# Patient Record
Sex: Male | Born: 1940 | Hispanic: No | Marital: Married | Smoking: Never smoker
Health system: Southern US, Community
[De-identification: ages and names within clinical notes are randomized; demographics above are authoritative.]

## PROBLEM LIST (undated history)

## (undated) DIAGNOSIS — J45909 Unspecified asthma, uncomplicated: Secondary | ICD-10-CM

## (undated) DIAGNOSIS — E785 Hyperlipidemia, unspecified: Secondary | ICD-10-CM

## (undated) DIAGNOSIS — I10 Essential (primary) hypertension: Secondary | ICD-10-CM

## (undated) DIAGNOSIS — G4730 Sleep apnea, unspecified: Secondary | ICD-10-CM

## (undated) DIAGNOSIS — I209 Angina pectoris, unspecified: Secondary | ICD-10-CM

## (undated) DIAGNOSIS — I2510 Atherosclerotic heart disease of native coronary artery without angina pectoris: Secondary | ICD-10-CM

## (undated) DIAGNOSIS — E119 Type 2 diabetes mellitus without complications: Secondary | ICD-10-CM

## (undated) HISTORY — PX: TEE WITHOUT CARDIOVERSION: SHX5443

## (undated) HISTORY — PX: HERNIA REPAIR: SHX51

## (undated) HISTORY — PX: TRACHEOSTOMY: SUR1362

## (undated) HISTORY — PX: VASCULAR SURGERY: SHX849

## (undated) HISTORY — PX: APPENDECTOMY: SHX54

## (undated) HISTORY — PX: CORONARY ARTERY BYPASS GRAFT: SHX141

---

## 2007-08-24 ENCOUNTER — Other Ambulatory Visit: Payer: Self-pay

## 2007-08-24 ENCOUNTER — Emergency Department: Payer: Self-pay | Admitting: Emergency Medicine

## 2008-01-07 ENCOUNTER — Inpatient Hospital Stay: Payer: Self-pay | Admitting: Internal Medicine

## 2008-01-07 ENCOUNTER — Other Ambulatory Visit: Payer: Self-pay

## 2010-10-15 ENCOUNTER — Ambulatory Visit: Payer: Self-pay | Admitting: Ophthalmology

## 2010-11-29 ENCOUNTER — Ambulatory Visit: Payer: Self-pay | Admitting: Ophthalmology

## 2013-09-01 DIAGNOSIS — J45909 Unspecified asthma, uncomplicated: Secondary | ICD-10-CM | POA: Insufficient documentation

## 2013-09-01 DIAGNOSIS — Z9049 Acquired absence of other specified parts of digestive tract: Secondary | ICD-10-CM

## 2013-09-01 DIAGNOSIS — R9439 Abnormal result of other cardiovascular function study: Secondary | ICD-10-CM | POA: Insufficient documentation

## 2013-09-01 HISTORY — DX: Acquired absence of other specified parts of digestive tract: Z90.49

## 2013-09-05 DIAGNOSIS — Z951 Presence of aortocoronary bypass graft: Secondary | ICD-10-CM | POA: Insufficient documentation

## 2013-09-06 DIAGNOSIS — D62 Acute posthemorrhagic anemia: Secondary | ICD-10-CM

## 2013-09-06 DIAGNOSIS — D696 Thrombocytopenia, unspecified: Secondary | ICD-10-CM | POA: Insufficient documentation

## 2013-09-06 HISTORY — DX: Acute posthemorrhagic anemia: D62

## 2013-09-19 DIAGNOSIS — D649 Anemia, unspecified: Secondary | ICD-10-CM | POA: Insufficient documentation

## 2013-09-19 DIAGNOSIS — R0689 Other abnormalities of breathing: Secondary | ICD-10-CM | POA: Insufficient documentation

## 2013-09-20 DIAGNOSIS — K567 Ileus, unspecified: Secondary | ICD-10-CM

## 2013-09-20 HISTORY — DX: Ileus, unspecified: K56.7

## 2013-09-22 DIAGNOSIS — Z930 Tracheostomy status: Secondary | ICD-10-CM | POA: Insufficient documentation

## 2013-11-18 ENCOUNTER — Ambulatory Visit: Payer: Self-pay | Admitting: Physician Assistant

## 2013-12-14 ENCOUNTER — Ambulatory Visit: Payer: Self-pay | Admitting: Internal Medicine

## 2014-02-27 ENCOUNTER — Ambulatory Visit: Payer: Self-pay | Admitting: Physician Assistant

## 2014-03-16 ENCOUNTER — Ambulatory Visit: Payer: Self-pay | Admitting: Physician Assistant

## 2014-07-31 ENCOUNTER — Ambulatory Visit: Payer: Self-pay | Admitting: Physician Assistant

## 2014-11-06 DIAGNOSIS — I2510 Atherosclerotic heart disease of native coronary artery without angina pectoris: Secondary | ICD-10-CM | POA: Diagnosis not present

## 2014-11-06 DIAGNOSIS — R300 Dysuria: Secondary | ICD-10-CM | POA: Diagnosis not present

## 2014-11-06 DIAGNOSIS — G479 Sleep disorder, unspecified: Secondary | ICD-10-CM | POA: Diagnosis not present

## 2014-11-06 DIAGNOSIS — I83813 Varicose veins of bilateral lower extremities with pain: Secondary | ICD-10-CM | POA: Diagnosis not present

## 2014-11-06 DIAGNOSIS — E1165 Type 2 diabetes mellitus with hyperglycemia: Secondary | ICD-10-CM | POA: Diagnosis not present

## 2014-11-06 DIAGNOSIS — D649 Anemia, unspecified: Secondary | ICD-10-CM | POA: Diagnosis not present

## 2014-11-06 DIAGNOSIS — Z0001 Encounter for general adult medical examination with abnormal findings: Secondary | ICD-10-CM | POA: Diagnosis not present

## 2014-11-14 DIAGNOSIS — G4733 Obstructive sleep apnea (adult) (pediatric): Secondary | ICD-10-CM | POA: Diagnosis not present

## 2014-11-21 DIAGNOSIS — E1165 Type 2 diabetes mellitus with hyperglycemia: Secondary | ICD-10-CM | POA: Diagnosis not present

## 2014-11-21 DIAGNOSIS — G4733 Obstructive sleep apnea (adult) (pediatric): Secondary | ICD-10-CM | POA: Diagnosis not present

## 2014-11-21 DIAGNOSIS — D519 Vitamin B12 deficiency anemia, unspecified: Secondary | ICD-10-CM | POA: Diagnosis not present

## 2014-11-23 DIAGNOSIS — I700 Atherosclerosis of aorta: Secondary | ICD-10-CM | POA: Diagnosis not present

## 2014-11-23 DIAGNOSIS — M79669 Pain in unspecified lower leg: Secondary | ICD-10-CM | POA: Diagnosis not present

## 2014-12-05 DIAGNOSIS — G4733 Obstructive sleep apnea (adult) (pediatric): Secondary | ICD-10-CM | POA: Diagnosis not present

## 2014-12-25 DIAGNOSIS — I10 Essential (primary) hypertension: Secondary | ICD-10-CM | POA: Diagnosis not present

## 2014-12-25 DIAGNOSIS — E1165 Type 2 diabetes mellitus with hyperglycemia: Secondary | ICD-10-CM | POA: Diagnosis not present

## 2014-12-25 DIAGNOSIS — G4733 Obstructive sleep apnea (adult) (pediatric): Secondary | ICD-10-CM | POA: Diagnosis not present

## 2014-12-25 DIAGNOSIS — J4520 Mild intermittent asthma, uncomplicated: Secondary | ICD-10-CM | POA: Diagnosis not present

## 2015-01-03 DIAGNOSIS — G4710 Hypersomnia, unspecified: Secondary | ICD-10-CM | POA: Diagnosis not present

## 2015-01-03 DIAGNOSIS — G4733 Obstructive sleep apnea (adult) (pediatric): Secondary | ICD-10-CM | POA: Diagnosis not present

## 2015-01-22 DIAGNOSIS — Z1211 Encounter for screening for malignant neoplasm of colon: Secondary | ICD-10-CM | POA: Diagnosis not present

## 2015-01-22 DIAGNOSIS — D649 Anemia, unspecified: Secondary | ICD-10-CM | POA: Diagnosis not present

## 2015-01-29 DIAGNOSIS — J4520 Mild intermittent asthma, uncomplicated: Secondary | ICD-10-CM | POA: Diagnosis not present

## 2015-01-29 DIAGNOSIS — G4733 Obstructive sleep apnea (adult) (pediatric): Secondary | ICD-10-CM | POA: Diagnosis not present

## 2015-02-02 DIAGNOSIS — G4710 Hypersomnia, unspecified: Secondary | ICD-10-CM | POA: Diagnosis not present

## 2015-02-02 DIAGNOSIS — G4733 Obstructive sleep apnea (adult) (pediatric): Secondary | ICD-10-CM | POA: Diagnosis not present

## 2015-02-05 DIAGNOSIS — G4733 Obstructive sleep apnea (adult) (pediatric): Secondary | ICD-10-CM | POA: Diagnosis not present

## 2015-02-05 DIAGNOSIS — I10 Essential (primary) hypertension: Secondary | ICD-10-CM | POA: Diagnosis not present

## 2015-02-05 DIAGNOSIS — E119 Type 2 diabetes mellitus without complications: Secondary | ICD-10-CM | POA: Diagnosis not present

## 2015-02-05 DIAGNOSIS — E6609 Other obesity due to excess calories: Secondary | ICD-10-CM | POA: Diagnosis not present

## 2015-02-05 DIAGNOSIS — L600 Ingrowing nail: Secondary | ICD-10-CM | POA: Diagnosis not present

## 2015-02-05 DIAGNOSIS — E1165 Type 2 diabetes mellitus with hyperglycemia: Secondary | ICD-10-CM | POA: Diagnosis not present

## 2015-02-05 DIAGNOSIS — I2510 Atherosclerotic heart disease of native coronary artery without angina pectoris: Secondary | ICD-10-CM | POA: Diagnosis not present

## 2015-02-05 DIAGNOSIS — I25119 Atherosclerotic heart disease of native coronary artery with unspecified angina pectoris: Secondary | ICD-10-CM | POA: Diagnosis not present

## 2015-02-05 DIAGNOSIS — B351 Tinea unguium: Secondary | ICD-10-CM | POA: Diagnosis not present

## 2015-02-05 DIAGNOSIS — M722 Plantar fascial fibromatosis: Secondary | ICD-10-CM | POA: Diagnosis not present

## 2015-02-05 DIAGNOSIS — E782 Mixed hyperlipidemia: Secondary | ICD-10-CM | POA: Diagnosis not present

## 2015-03-05 DIAGNOSIS — G4733 Obstructive sleep apnea (adult) (pediatric): Secondary | ICD-10-CM | POA: Diagnosis not present

## 2015-03-05 DIAGNOSIS — G4710 Hypersomnia, unspecified: Secondary | ICD-10-CM | POA: Diagnosis not present

## 2015-03-12 DIAGNOSIS — E782 Mixed hyperlipidemia: Secondary | ICD-10-CM | POA: Diagnosis not present

## 2015-03-12 DIAGNOSIS — I10 Essential (primary) hypertension: Secondary | ICD-10-CM | POA: Diagnosis not present

## 2015-03-12 DIAGNOSIS — E1165 Type 2 diabetes mellitus with hyperglycemia: Secondary | ICD-10-CM | POA: Diagnosis not present

## 2015-03-12 DIAGNOSIS — D649 Anemia, unspecified: Secondary | ICD-10-CM | POA: Diagnosis not present

## 2015-03-26 DIAGNOSIS — Z1211 Encounter for screening for malignant neoplasm of colon: Secondary | ICD-10-CM | POA: Diagnosis not present

## 2015-04-04 DIAGNOSIS — G4733 Obstructive sleep apnea (adult) (pediatric): Secondary | ICD-10-CM | POA: Diagnosis not present

## 2015-04-04 DIAGNOSIS — G4710 Hypersomnia, unspecified: Secondary | ICD-10-CM | POA: Diagnosis not present

## 2015-04-23 DIAGNOSIS — I10 Essential (primary) hypertension: Secondary | ICD-10-CM | POA: Diagnosis not present

## 2015-04-23 DIAGNOSIS — D519 Vitamin B12 deficiency anemia, unspecified: Secondary | ICD-10-CM | POA: Diagnosis not present

## 2015-04-23 DIAGNOSIS — E782 Mixed hyperlipidemia: Secondary | ICD-10-CM | POA: Diagnosis not present

## 2015-04-23 DIAGNOSIS — E1142 Type 2 diabetes mellitus with diabetic polyneuropathy: Secondary | ICD-10-CM | POA: Diagnosis not present

## 2015-04-23 DIAGNOSIS — I83813 Varicose veins of bilateral lower extremities with pain: Secondary | ICD-10-CM | POA: Diagnosis not present

## 2015-05-04 ENCOUNTER — Encounter: Payer: Self-pay | Admitting: *Deleted

## 2015-05-05 DIAGNOSIS — G4710 Hypersomnia, unspecified: Secondary | ICD-10-CM | POA: Diagnosis not present

## 2015-05-05 DIAGNOSIS — G4733 Obstructive sleep apnea (adult) (pediatric): Secondary | ICD-10-CM | POA: Diagnosis not present

## 2015-05-07 ENCOUNTER — Ambulatory Visit
Admission: RE | Admit: 2015-05-07 | Discharge: 2015-05-07 | Disposition: A | Discharge status: Home / Self Care | Payer: Medicare Other | Source: Ambulatory Visit | Attending: Unknown Physician Specialty | Admitting: Unknown Physician Specialty

## 2015-05-07 ENCOUNTER — Encounter: Payer: Self-pay | Admitting: *Deleted

## 2015-05-07 HISTORY — DX: Hyperlipidemia, unspecified: E78.5

## 2015-05-07 HISTORY — DX: Essential (primary) hypertension: I10

## 2015-05-07 HISTORY — DX: Atherosclerotic heart disease of native coronary artery without angina pectoris: I25.10

## 2015-05-07 HISTORY — DX: Unspecified asthma, uncomplicated: J45.909

## 2015-05-07 HISTORY — DX: Angina pectoris, unspecified: I20.9

## 2015-05-07 HISTORY — DX: Type 2 diabetes mellitus without complications: E11.9

## 2015-05-07 HISTORY — DX: Sleep apnea, unspecified: G47.30

## 2015-05-17 DIAGNOSIS — E1142 Type 2 diabetes mellitus with diabetic polyneuropathy: Secondary | ICD-10-CM | POA: Diagnosis not present

## 2015-05-17 DIAGNOSIS — I10 Essential (primary) hypertension: Secondary | ICD-10-CM | POA: Diagnosis not present

## 2015-05-17 DIAGNOSIS — I83813 Varicose veins of bilateral lower extremities with pain: Secondary | ICD-10-CM | POA: Diagnosis not present

## 2015-05-17 DIAGNOSIS — M79669 Pain in unspecified lower leg: Secondary | ICD-10-CM | POA: Diagnosis not present

## 2015-05-17 DIAGNOSIS — I2510 Atherosclerotic heart disease of native coronary artery without angina pectoris: Secondary | ICD-10-CM | POA: Diagnosis not present

## 2015-06-04 DIAGNOSIS — B351 Tinea unguium: Secondary | ICD-10-CM | POA: Diagnosis not present

## 2015-06-04 DIAGNOSIS — M79675 Pain in left toe(s): Secondary | ICD-10-CM | POA: Diagnosis not present

## 2015-06-04 DIAGNOSIS — M79674 Pain in right toe(s): Secondary | ICD-10-CM | POA: Diagnosis not present

## 2015-06-04 DIAGNOSIS — M722 Plantar fascial fibromatosis: Secondary | ICD-10-CM | POA: Diagnosis not present

## 2015-06-04 DIAGNOSIS — M7752 Other enthesopathy of left foot: Secondary | ICD-10-CM | POA: Diagnosis not present

## 2015-06-05 DIAGNOSIS — G4733 Obstructive sleep apnea (adult) (pediatric): Secondary | ICD-10-CM | POA: Diagnosis not present

## 2015-06-05 DIAGNOSIS — G4710 Hypersomnia, unspecified: Secondary | ICD-10-CM | POA: Diagnosis not present

## 2015-06-08 ENCOUNTER — Encounter: Payer: Self-pay | Admitting: *Deleted

## 2015-06-11 ENCOUNTER — Encounter: Payer: Self-pay | Admitting: *Deleted

## 2015-06-11 ENCOUNTER — Ambulatory Visit
Admission: RE | Admit: 2015-06-11 | Payer: Medicare Other | Source: Ambulatory Visit | Admitting: Unknown Physician Specialty

## 2015-06-11 ENCOUNTER — Encounter
Admission: RE | Disposition: A | Discharge status: Home / Self Care | Payer: Self-pay | Source: Ambulatory Visit | Attending: Unknown Physician Specialty

## 2015-06-11 ENCOUNTER — Ambulatory Visit: Payer: Medicare Other | Admitting: Certified Registered Nurse Anesthetist

## 2015-06-11 ENCOUNTER — Ambulatory Visit
Admission: RE | Admit: 2015-06-11 | Discharge: 2015-06-11 | Disposition: A | Discharge status: Home / Self Care | Payer: Medicare Other | Source: Ambulatory Visit | Attending: Unknown Physician Specialty | Admitting: Unknown Physician Specialty

## 2015-06-11 ENCOUNTER — Encounter: Admission: RE | Payer: Self-pay | Source: Ambulatory Visit

## 2015-06-11 DIAGNOSIS — K5730 Diverticulosis of large intestine without perforation or abscess without bleeding: Secondary | ICD-10-CM | POA: Insufficient documentation

## 2015-06-11 DIAGNOSIS — I10 Essential (primary) hypertension: Secondary | ICD-10-CM | POA: Insufficient documentation

## 2015-06-11 DIAGNOSIS — Z7982 Long term (current) use of aspirin: Secondary | ICD-10-CM | POA: Diagnosis not present

## 2015-06-11 DIAGNOSIS — G4730 Sleep apnea, unspecified: Secondary | ICD-10-CM | POA: Insufficient documentation

## 2015-06-11 DIAGNOSIS — K640 First degree hemorrhoids: Secondary | ICD-10-CM | POA: Diagnosis not present

## 2015-06-11 DIAGNOSIS — J45909 Unspecified asthma, uncomplicated: Secondary | ICD-10-CM | POA: Diagnosis not present

## 2015-06-11 DIAGNOSIS — Z79899 Other long term (current) drug therapy: Secondary | ICD-10-CM | POA: Insufficient documentation

## 2015-06-11 DIAGNOSIS — Z980 Intestinal bypass and anastomosis status: Secondary | ICD-10-CM | POA: Diagnosis not present

## 2015-06-11 DIAGNOSIS — Z9109 Other allergy status, other than to drugs and biological substances: Secondary | ICD-10-CM | POA: Diagnosis not present

## 2015-06-11 DIAGNOSIS — E785 Hyperlipidemia, unspecified: Secondary | ICD-10-CM | POA: Insufficient documentation

## 2015-06-11 DIAGNOSIS — Z1211 Encounter for screening for malignant neoplasm of colon: Secondary | ICD-10-CM | POA: Insufficient documentation

## 2015-06-11 DIAGNOSIS — K635 Polyp of colon: Secondary | ICD-10-CM | POA: Diagnosis not present

## 2015-06-11 DIAGNOSIS — E119 Type 2 diabetes mellitus without complications: Secondary | ICD-10-CM | POA: Diagnosis not present

## 2015-06-11 DIAGNOSIS — D125 Benign neoplasm of sigmoid colon: Secondary | ICD-10-CM | POA: Diagnosis not present

## 2015-06-11 DIAGNOSIS — Z880 Allergy status to penicillin: Secondary | ICD-10-CM | POA: Insufficient documentation

## 2015-06-11 DIAGNOSIS — K648 Other hemorrhoids: Secondary | ICD-10-CM | POA: Diagnosis not present

## 2015-06-11 DIAGNOSIS — Z951 Presence of aortocoronary bypass graft: Secondary | ICD-10-CM | POA: Diagnosis not present

## 2015-06-11 DIAGNOSIS — I2510 Atherosclerotic heart disease of native coronary artery without angina pectoris: Secondary | ICD-10-CM | POA: Diagnosis not present

## 2015-06-11 HISTORY — PX: COLONOSCOPY WITH PROPOFOL: SHX5780

## 2015-06-11 LAB — GLUCOSE, CAPILLARY: GLUCOSE-CAPILLARY: 124 mg/dL — AB (ref 65–99)

## 2015-06-11 SURGERY — COLONOSCOPY WITH PROPOFOL
Anesthesia: General

## 2015-06-11 MED ORDER — FENTANYL CITRATE (PF) 100 MCG/2ML IJ SOLN: INTRAMUSCULAR | Status: DC | PRN

## 2015-06-11 MED ORDER — PROPOFOL 500 MG/50ML IV EMUL
INTRAVENOUS | Status: DC | PRN
  Administered 2015-06-11: 120 ug/kg/min via INTRAVENOUS

## 2015-06-11 MED ORDER — SODIUM CHLORIDE 0.9 % IV SOLN
INTRAVENOUS | Status: DC
  Administered 2015-06-11: 11:00:00 via INTRAVENOUS

## 2015-06-11 MED ORDER — SODIUM CHLORIDE 0.9 % IV SOLN: INTRAVENOUS | Status: DC

## 2015-06-11 MED ORDER — MIDAZOLAM HCL 5 MG/5ML IJ SOLN: INTRAMUSCULAR | Status: DC | PRN

## 2015-06-11 MED ORDER — LIDOCAINE HCL (CARDIAC) 20 MG/ML IV SOLN: INTRAVENOUS | Status: DC | PRN

## 2015-06-11 MED ORDER — GLYCOPYRROLATE 0.2 MG/ML IJ SOLN: INTRAMUSCULAR | Status: DC | PRN

## 2015-06-11 MED ORDER — PROPOFOL 10 MG/ML IV BOLUS
INTRAVENOUS | Status: DC | PRN
  Administered 2015-06-11: 35 mg via INTRAVENOUS

## 2015-06-11 NOTE — Op Note (Signed)
[facility] Gastroenterology Patient Name: Daniel Hodges Procedure Date: 06/11/2015 10:53 AM MRN: [id number] Account #: [id number] Date of Birth: September 22, 1940 Admit Type: Outpatient Age: 74 Room: [facility] ENDO ROOM 1 Gender: Male Note Status: Finalized Procedure:         Colonoscopy Indications:       Screening for colorectal malignant neoplasm Providers:         Manya Silvas, MD Referring MD:      Allyne Gee, MD (Referring MD) Medicines:         Propofol per Anesthesia Complications:     No immediate complications. Procedure:         Pre-Anesthesia Assessment:                    - After reviewing the risks and benefits, the patient was                     deemed in satisfactory condition to undergo the procedure.                    After obtaining informed consent, the colonoscope was                     passed under direct vision. Throughout the procedure, the                     patient's blood pressure, pulse, and oxygen saturations                     were monitored continuously. The Colonoscope was                     introduced through the anus and advanced to the the                     ileocolonic anastomosis. The colonoscopy was performed                     without difficulty. The patient tolerated the procedure                     well. The quality of the bowel preparation was adequate to                     identify polyps. Findings:      A diminutive polyp was found in the sigmoid colon. The polyp was       sessile. The polyp was removed with a jumbo cold forceps. Resection and       retrieval were complete.      A few small-mouthed diverticula were found in the descending colon.      Internal hemorrhoids were found during endoscopy. The hemorrhoids were       small and Grade I (internal hemorrhoids that do not prolapse).      There was evidence of a prior end-to-side ileo-colonic anastomosis in       the ascending colon. This was patent. This  was characterized by healthy       appearing mucosa. This was not traversed. Impression:        - One diminutive polyp in the sigmoid colon. Resected and                     retrieved.                    -  Diverticulosis in the descending colon.                    - Internal hemorrhoids.                    - Patent end-to-side ileo-colonic anastomosis,                     characterized by healthy appearing mucosa. Recommendation:    - Await pathology results. Manya Silvas, MD 06/11/2015 11:16:08 AM This report has been signed electronically. Number of Addenda: 0 Note Initiated On: 06/11/2015 10:53 AM Scope Withdrawal Time: 0 hours 6 minutes 36 seconds  Total Procedure Duration: 0 hours 10 minutes 18 seconds       [facility]

## 2015-06-11 NOTE — H&P (Signed)
Primary Care Physician:  Lavera Guise, MD Primary Gastroenterologist:  Dr. Vira Agar colon Pre-Procedure History & Physical: HPI:  Daniel Hodges is a 74 y.o. male is here for an colonoscopy.   Past Medical History  Diagnosis Date  . Hypertension   . Diabetes mellitus without complication   . Asthma   . Anginal pain   . Coronary artery disease   . Sleep apnea   . Hyperlipidemia     Past Surgical History  Procedure Laterality Date  . Coronary artery bypass graft    . Appendectomy    . Tee without cardioversion    . Tracheostomy    . Vascular surgery      Prior to Admission medications   Medication Sig Start Date End Date Taking? Authorizing Shanayah Kaffenberger  atorvastatin (LIPITOR) 20 MG tablet Take 20 mg by mouth daily.   Yes Historical Rozalyn Osland, MD  Dulaglutide (TRULICITY) 1.5 GG/8.3MO SOPN Inject into the skin.   Yes Historical Ama Mcmaster, MD  ferrous sulfate 325 (65 FE) MG tablet Take 325 mg by mouth 2 (two) times daily with a meal.   Yes Historical Marcelis Wissner, MD  furosemide (LASIX) 40 MG tablet Take 40 mg by mouth.   Yes Historical Sanjit Mcmichael, MD  gabapentin (NEURONTIN) 300 MG capsule Take 300 mg by mouth at bedtime.   Yes Historical Dontell Mian, MD  glipiZIDE (GLUCOTROL) 10 MG tablet Take 10 mg by mouth daily before breakfast.   Yes Historical Kendell Sagraves, MD  metFORMIN (GLUCOPHAGE) 850 MG tablet Take 850 mg by mouth 2 (two) times daily with a meal.   Yes Historical Jerico Grisso, MD  montelukast (SINGULAIR) 10 MG tablet Take 10 mg by mouth at bedtime.   Yes Historical Hilma Steinhilber, MD  Multiple Vitamin (MULTIVITAMIN WITH MINERALS) TABS tablet Take 1 tablet by mouth daily.   Yes Historical Karalee Hauter, MD  senna (SENOKOT) 8.6 MG tablet Take 1 tablet by mouth daily.   Yes Historical Mellony Danziger, MD  simethicone (MYLICON) 80 MG chewable tablet Chew 80 mg by mouth every 6 (six) hours as needed for flatulence.   Yes Historical Gabi Mcfate, MD  aspirin 81 MG tablet Take 81 mg by mouth daily.    Historical  Monzerrat Wellen, MD    Allergies as of 06/07/2015 - Review Complete 05/07/2015  Allergen Reaction Noted  . Penicillins Anaphylaxis 05/04/2015  . Neostigmine  05/04/2015    History reviewed. No pertinent family history.  Social History   Social History  . Marital Status: Married    Spouse Name: N/A  . Number of Children: N/A  . Years of Education: N/A   Occupational History  . Not on file.   Social History Main Topics  . Smoking status: Never Smoker   . Smokeless tobacco: Never Used  . Alcohol Use: No  . Drug Use: No  . Sexual Activity: Not on file   Other Topics Concern  . Not on file   Social History Narrative    Review of Systems: See HPI, otherwise negative ROS  Physical Exam: BP 128/67 mmHg  Pulse 80  Temp(Src) 98.5 F (36.9 C) (Tympanic)  Resp 14  Ht 5\' 6"  (1.676 m)  Wt 78.472 kg (173 lb)  BMI 27.94 kg/m2  SpO2 100% General:   Alert,  pleasant and cooperative in NAD Head:  Normocephalic and atraumatic. Neck:  Supple; no masses or thyromegaly. Lungs:  Clear throughout to auscultation.    Heart:  Regular rate and rhythm. Abdomen:  Soft, nontender and nondistended. Normal bowel sounds, without guarding, and without  rebound.   Neurologic:  Alert and  oriented x4;  grossly normal neurologically.  Impression/Plan: Daniel Hodges is here for an colonoscopy to be performed for screening exam  Risks, benefits, limitations, and alternatives regarding  colonoscopy have been reviewed with the patient.  Questions have been answered.  All parties agreeable.   Gaylyn Cheers, MD  06/11/2015, 10:53 AM

## 2015-06-11 NOTE — Transfer of Care (Signed)
Immediate Anesthesia Transfer of Care Note  Patient: Daniel Hodges  Procedure(s) Performed: Procedure(s): COLONOSCOPY WITH PROPOFOL (N/A)  Patient Location: PACU  Anesthesia Type:General  Level of Consciousness: awake, alert , oriented and patient cooperative  Airway & Oxygen Therapy: Patient Spontanous Breathing and Patient connected to nasal cannula oxygen  Post-op Assessment: Report given to RN and Post -op Vital signs reviewed and stable  Post vital signs: Reviewed and stable  Last Vitals:  Filed Vitals:   06/11/15 1116  BP: 93/60  Pulse:   Temp: 36.1 C  Resp: 20    Complications: No apparent anesthesia complications

## 2015-06-11 NOTE — Anesthesia Postprocedure Evaluation (Signed)
  Anesthesia Post-op Note  Patient: Daniel Hodges  Procedure(s) Performed: Procedure(s): COLONOSCOPY WITH PROPOFOL (N/A)  Anesthesia type:General  Patient location: PACU  Post pain: Pain level controlled  Post assessment: Post-op Vital signs reviewed, Patient's Cardiovascular Status Stable, Respiratory Function Stable, Patent Airway and No signs of Nausea or vomiting  Post vital signs: Reviewed and stable  Last Vitals:  Filed Vitals:   06/11/15 1150  BP: 136/84  Pulse: 89  Temp:   Resp: 22    Level of consciousness: awake, alert  and patient cooperative  Complications: No apparent anesthesia complications

## 2015-06-11 NOTE — Anesthesia Preprocedure Evaluation (Signed)
Anesthesia Evaluation  Patient identified by MRN, date of birth, ID band Patient awake    Reviewed: Allergy & Precautions, NPO status , Patient's Chart, lab work & pertinent test results  Airway Mallampati: II  TM Distance: >3 FB Neck ROM: Limited    Dental  (+) Upper Dentures, Lower Dentures   Pulmonary asthma , sleep apnea and Continuous Positive Airway Pressure Ventilation ,    Pulmonary exam normal        Cardiovascular Exercise Tolerance: Poor hypertension, Pt. on medications + angina with exertion + CAD and + CABG  Normal cardiovascular exam     Neuro/Psych    GI/Hepatic   Endo/Other  diabetes, Type 2BG 124.  Renal/GU      Musculoskeletal   Abdominal (+)  Abdomen: soft.    Peds  Hematology   Anesthesia Other Findings   Reproductive/Obstetrics                             Anesthesia Physical Anesthesia Plan  ASA: III  Anesthesia Plan: General   Post-op Pain Management:    Induction: Intravenous  Airway Management Planned: Nasal Cannula  Additional Equipment:   Intra-op Plan:   Post-operative Plan:   Informed Consent: I have reviewed the patients History and Physical, chart, labs and discussed the procedure including the risks, benefits and alternatives for the proposed anesthesia with the patient or authorized representative who has indicated his/her understanding and acceptance.     Plan Discussed with: CRNA  Anesthesia Plan Comments:         Anesthesia Quick Evaluation

## 2015-06-12 ENCOUNTER — Encounter: Payer: Self-pay | Admitting: Unknown Physician Specialty

## 2015-06-12 LAB — SURGICAL PATHOLOGY

## 2015-06-13 DIAGNOSIS — M79669 Pain in unspecified lower leg: Secondary | ICD-10-CM | POA: Diagnosis not present

## 2015-06-13 DIAGNOSIS — I83813 Varicose veins of bilateral lower extremities with pain: Secondary | ICD-10-CM | POA: Diagnosis not present

## 2015-06-25 DIAGNOSIS — I83813 Varicose veins of bilateral lower extremities with pain: Secondary | ICD-10-CM | POA: Diagnosis not present

## 2015-06-25 DIAGNOSIS — J4520 Mild intermittent asthma, uncomplicated: Secondary | ICD-10-CM | POA: Diagnosis not present

## 2015-06-25 DIAGNOSIS — R05 Cough: Secondary | ICD-10-CM | POA: Diagnosis not present

## 2015-06-25 DIAGNOSIS — G4733 Obstructive sleep apnea (adult) (pediatric): Secondary | ICD-10-CM | POA: Diagnosis not present

## 2015-07-04 DIAGNOSIS — G4733 Obstructive sleep apnea (adult) (pediatric): Secondary | ICD-10-CM | POA: Diagnosis not present

## 2015-07-05 DIAGNOSIS — G4710 Hypersomnia, unspecified: Secondary | ICD-10-CM | POA: Diagnosis not present

## 2015-07-05 DIAGNOSIS — G4733 Obstructive sleep apnea (adult) (pediatric): Secondary | ICD-10-CM | POA: Diagnosis not present

## 2015-07-27 DIAGNOSIS — M1712 Unilateral primary osteoarthritis, left knee: Secondary | ICD-10-CM | POA: Diagnosis not present

## 2015-07-27 DIAGNOSIS — I2510 Atherosclerotic heart disease of native coronary artery without angina pectoris: Secondary | ICD-10-CM | POA: Diagnosis not present

## 2015-07-27 DIAGNOSIS — I10 Essential (primary) hypertension: Secondary | ICD-10-CM | POA: Diagnosis not present

## 2015-07-27 DIAGNOSIS — E1142 Type 2 diabetes mellitus with diabetic polyneuropathy: Secondary | ICD-10-CM | POA: Diagnosis not present

## 2015-08-05 DIAGNOSIS — G4733 Obstructive sleep apnea (adult) (pediatric): Secondary | ICD-10-CM | POA: Diagnosis not present

## 2015-08-05 DIAGNOSIS — G4710 Hypersomnia, unspecified: Secondary | ICD-10-CM | POA: Diagnosis not present

## 2015-08-13 DIAGNOSIS — I10 Essential (primary) hypertension: Secondary | ICD-10-CM | POA: Diagnosis not present

## 2015-08-13 DIAGNOSIS — E782 Mixed hyperlipidemia: Secondary | ICD-10-CM | POA: Diagnosis not present

## 2015-08-13 DIAGNOSIS — I351 Nonrheumatic aortic (valve) insufficiency: Secondary | ICD-10-CM | POA: Diagnosis not present

## 2015-08-13 DIAGNOSIS — I2510 Atherosclerotic heart disease of native coronary artery without angina pectoris: Secondary | ICD-10-CM | POA: Diagnosis not present

## 2015-08-14 DIAGNOSIS — I351 Nonrheumatic aortic (valve) insufficiency: Secondary | ICD-10-CM | POA: Insufficient documentation

## 2015-08-20 DIAGNOSIS — G4710 Hypersomnia, unspecified: Secondary | ICD-10-CM | POA: Diagnosis not present

## 2015-08-20 DIAGNOSIS — G4733 Obstructive sleep apnea (adult) (pediatric): Secondary | ICD-10-CM | POA: Diagnosis not present

## 2015-08-24 DIAGNOSIS — R011 Cardiac murmur, unspecified: Secondary | ICD-10-CM | POA: Diagnosis not present

## 2015-08-24 DIAGNOSIS — I2510 Atherosclerotic heart disease of native coronary artery without angina pectoris: Secondary | ICD-10-CM | POA: Diagnosis not present

## 2015-09-04 DIAGNOSIS — G4710 Hypersomnia, unspecified: Secondary | ICD-10-CM | POA: Diagnosis not present

## 2015-09-04 DIAGNOSIS — G4733 Obstructive sleep apnea (adult) (pediatric): Secondary | ICD-10-CM | POA: Diagnosis not present

## 2015-09-19 DIAGNOSIS — G4733 Obstructive sleep apnea (adult) (pediatric): Secondary | ICD-10-CM | POA: Diagnosis not present

## 2015-09-19 DIAGNOSIS — G4710 Hypersomnia, unspecified: Secondary | ICD-10-CM | POA: Diagnosis not present

## 2015-10-05 DIAGNOSIS — G4733 Obstructive sleep apnea (adult) (pediatric): Secondary | ICD-10-CM | POA: Diagnosis not present

## 2015-10-05 DIAGNOSIS — G4710 Hypersomnia, unspecified: Secondary | ICD-10-CM | POA: Diagnosis not present

## 2015-11-02 DIAGNOSIS — H35372 Puckering of macula, left eye: Secondary | ICD-10-CM | POA: Diagnosis not present

## 2015-11-05 DIAGNOSIS — G4733 Obstructive sleep apnea (adult) (pediatric): Secondary | ICD-10-CM | POA: Diagnosis not present

## 2015-11-05 DIAGNOSIS — G4710 Hypersomnia, unspecified: Secondary | ICD-10-CM | POA: Diagnosis not present

## 2015-11-16 DIAGNOSIS — Z0001 Encounter for general adult medical examination with abnormal findings: Secondary | ICD-10-CM | POA: Diagnosis not present

## 2015-11-16 DIAGNOSIS — K5900 Constipation, unspecified: Secondary | ICD-10-CM | POA: Diagnosis not present

## 2015-11-16 DIAGNOSIS — E1142 Type 2 diabetes mellitus with diabetic polyneuropathy: Secondary | ICD-10-CM | POA: Diagnosis not present

## 2015-11-16 DIAGNOSIS — D519 Vitamin B12 deficiency anemia, unspecified: Secondary | ICD-10-CM | POA: Diagnosis not present

## 2015-11-16 DIAGNOSIS — I10 Essential (primary) hypertension: Secondary | ICD-10-CM | POA: Diagnosis not present

## 2015-11-16 DIAGNOSIS — I2510 Atherosclerotic heart disease of native coronary artery without angina pectoris: Secondary | ICD-10-CM | POA: Diagnosis not present

## 2015-11-23 DIAGNOSIS — Z961 Presence of intraocular lens: Secondary | ICD-10-CM | POA: Diagnosis not present

## 2015-12-03 DIAGNOSIS — G4710 Hypersomnia, unspecified: Secondary | ICD-10-CM | POA: Diagnosis not present

## 2015-12-03 DIAGNOSIS — G4733 Obstructive sleep apnea (adult) (pediatric): Secondary | ICD-10-CM | POA: Diagnosis not present

## 2016-01-01 DIAGNOSIS — G4733 Obstructive sleep apnea (adult) (pediatric): Secondary | ICD-10-CM | POA: Diagnosis not present

## 2016-01-01 DIAGNOSIS — E1165 Type 2 diabetes mellitus with hyperglycemia: Secondary | ICD-10-CM | POA: Diagnosis not present

## 2016-01-01 DIAGNOSIS — I2510 Atherosclerotic heart disease of native coronary artery without angina pectoris: Secondary | ICD-10-CM | POA: Diagnosis not present

## 2016-01-02 DIAGNOSIS — G4733 Obstructive sleep apnea (adult) (pediatric): Secondary | ICD-10-CM | POA: Diagnosis not present

## 2016-01-03 DIAGNOSIS — G4710 Hypersomnia, unspecified: Secondary | ICD-10-CM | POA: Diagnosis not present

## 2016-01-03 DIAGNOSIS — G4733 Obstructive sleep apnea (adult) (pediatric): Secondary | ICD-10-CM | POA: Diagnosis not present

## 2016-02-01 DIAGNOSIS — Z9989 Dependence on other enabling machines and devices: Secondary | ICD-10-CM

## 2016-02-01 DIAGNOSIS — E782 Mixed hyperlipidemia: Secondary | ICD-10-CM | POA: Diagnosis not present

## 2016-02-01 DIAGNOSIS — G4733 Obstructive sleep apnea (adult) (pediatric): Secondary | ICD-10-CM | POA: Diagnosis not present

## 2016-02-01 DIAGNOSIS — I10 Essential (primary) hypertension: Secondary | ICD-10-CM | POA: Diagnosis not present

## 2016-02-01 DIAGNOSIS — I2510 Atherosclerotic heart disease of native coronary artery without angina pectoris: Secondary | ICD-10-CM | POA: Diagnosis not present

## 2016-02-02 DIAGNOSIS — G4733 Obstructive sleep apnea (adult) (pediatric): Secondary | ICD-10-CM | POA: Diagnosis not present

## 2016-02-02 DIAGNOSIS — G4710 Hypersomnia, unspecified: Secondary | ICD-10-CM | POA: Diagnosis not present

## 2016-02-08 DIAGNOSIS — E1142 Type 2 diabetes mellitus with diabetic polyneuropathy: Secondary | ICD-10-CM | POA: Diagnosis not present

## 2016-02-08 DIAGNOSIS — R0602 Shortness of breath: Secondary | ICD-10-CM | POA: Diagnosis not present

## 2016-02-08 DIAGNOSIS — I10 Essential (primary) hypertension: Secondary | ICD-10-CM | POA: Diagnosis not present

## 2016-02-08 DIAGNOSIS — M1712 Unilateral primary osteoarthritis, left knee: Secondary | ICD-10-CM | POA: Diagnosis not present

## 2016-02-08 DIAGNOSIS — I2510 Atherosclerotic heart disease of native coronary artery without angina pectoris: Secondary | ICD-10-CM | POA: Diagnosis not present

## 2016-03-04 DIAGNOSIS — G4733 Obstructive sleep apnea (adult) (pediatric): Secondary | ICD-10-CM | POA: Diagnosis not present

## 2016-03-04 DIAGNOSIS — G4710 Hypersomnia, unspecified: Secondary | ICD-10-CM | POA: Diagnosis not present

## 2016-04-02 DIAGNOSIS — R0602 Shortness of breath: Secondary | ICD-10-CM | POA: Diagnosis not present

## 2016-04-03 DIAGNOSIS — G4710 Hypersomnia, unspecified: Secondary | ICD-10-CM | POA: Diagnosis not present

## 2016-04-03 DIAGNOSIS — G4733 Obstructive sleep apnea (adult) (pediatric): Secondary | ICD-10-CM | POA: Diagnosis not present

## 2016-04-25 DIAGNOSIS — G4710 Hypersomnia, unspecified: Secondary | ICD-10-CM | POA: Diagnosis not present

## 2016-04-25 DIAGNOSIS — G4733 Obstructive sleep apnea (adult) (pediatric): Secondary | ICD-10-CM | POA: Diagnosis not present

## 2016-05-16 DIAGNOSIS — E1142 Type 2 diabetes mellitus with diabetic polyneuropathy: Secondary | ICD-10-CM | POA: Diagnosis not present

## 2016-05-16 DIAGNOSIS — I10 Essential (primary) hypertension: Secondary | ICD-10-CM | POA: Diagnosis not present

## 2016-05-16 DIAGNOSIS — I83813 Varicose veins of bilateral lower extremities with pain: Secondary | ICD-10-CM | POA: Diagnosis not present

## 2016-05-16 DIAGNOSIS — I2510 Atherosclerotic heart disease of native coronary artery without angina pectoris: Secondary | ICD-10-CM | POA: Diagnosis not present

## 2016-06-27 DIAGNOSIS — R0602 Shortness of breath: Secondary | ICD-10-CM | POA: Diagnosis not present

## 2016-06-27 DIAGNOSIS — R011 Cardiac murmur, unspecified: Secondary | ICD-10-CM | POA: Diagnosis not present

## 2016-06-27 DIAGNOSIS — J00 Acute nasopharyngitis [common cold]: Secondary | ICD-10-CM | POA: Diagnosis not present

## 2016-07-09 DIAGNOSIS — R011 Cardiac murmur, unspecified: Secondary | ICD-10-CM | POA: Diagnosis not present

## 2016-07-18 DIAGNOSIS — G4710 Hypersomnia, unspecified: Secondary | ICD-10-CM | POA: Diagnosis not present

## 2016-07-18 DIAGNOSIS — G4733 Obstructive sleep apnea (adult) (pediatric): Secondary | ICD-10-CM | POA: Diagnosis not present

## 2016-08-01 DIAGNOSIS — E1122 Type 2 diabetes mellitus with diabetic chronic kidney disease: Secondary | ICD-10-CM | POA: Diagnosis not present

## 2016-08-01 DIAGNOSIS — E782 Mixed hyperlipidemia: Secondary | ICD-10-CM | POA: Diagnosis not present

## 2016-08-01 DIAGNOSIS — Z87898 Personal history of other specified conditions: Secondary | ICD-10-CM | POA: Diagnosis not present

## 2016-08-01 DIAGNOSIS — I10 Essential (primary) hypertension: Secondary | ICD-10-CM | POA: Diagnosis not present

## 2016-08-01 DIAGNOSIS — I2510 Atherosclerotic heart disease of native coronary artery without angina pectoris: Secondary | ICD-10-CM | POA: Diagnosis not present

## 2016-08-15 DIAGNOSIS — J4520 Mild intermittent asthma, uncomplicated: Secondary | ICD-10-CM | POA: Diagnosis not present

## 2016-08-15 DIAGNOSIS — I2510 Atherosclerotic heart disease of native coronary artery without angina pectoris: Secondary | ICD-10-CM | POA: Diagnosis not present

## 2016-08-15 DIAGNOSIS — I10 Essential (primary) hypertension: Secondary | ICD-10-CM | POA: Diagnosis not present

## 2016-08-15 DIAGNOSIS — J069 Acute upper respiratory infection, unspecified: Secondary | ICD-10-CM | POA: Diagnosis not present

## 2016-08-15 DIAGNOSIS — E1165 Type 2 diabetes mellitus with hyperglycemia: Secondary | ICD-10-CM | POA: Diagnosis not present

## 2016-08-26 ENCOUNTER — Ambulatory Visit
Admission: RE | Admit: 2016-08-26 | Discharge: 2016-08-26 | Disposition: A | Discharge status: Home / Self Care | Payer: Medicare Other | Source: Ambulatory Visit | Attending: Nurse Practitioner | Admitting: Nurse Practitioner

## 2016-08-26 ENCOUNTER — Other Ambulatory Visit: Payer: Self-pay | Admitting: Nurse Practitioner

## 2016-08-26 DIAGNOSIS — R05 Cough: Secondary | ICD-10-CM

## 2016-08-26 DIAGNOSIS — R0989 Other specified symptoms and signs involving the circulatory and respiratory systems: Secondary | ICD-10-CM

## 2016-08-26 DIAGNOSIS — I10 Essential (primary) hypertension: Secondary | ICD-10-CM | POA: Diagnosis not present

## 2016-08-26 DIAGNOSIS — Z951 Presence of aortocoronary bypass graft: Secondary | ICD-10-CM | POA: Diagnosis not present

## 2016-08-26 DIAGNOSIS — J9811 Atelectasis: Secondary | ICD-10-CM | POA: Diagnosis not present

## 2016-08-26 DIAGNOSIS — J069 Acute upper respiratory infection, unspecified: Secondary | ICD-10-CM | POA: Diagnosis not present

## 2016-08-26 DIAGNOSIS — R059 Cough, unspecified: Secondary | ICD-10-CM

## 2016-08-26 DIAGNOSIS — J4520 Mild intermittent asthma, uncomplicated: Secondary | ICD-10-CM | POA: Diagnosis not present

## 2016-08-26 DIAGNOSIS — E1165 Type 2 diabetes mellitus with hyperglycemia: Secondary | ICD-10-CM | POA: Diagnosis not present

## 2016-11-21 DIAGNOSIS — I10 Essential (primary) hypertension: Secondary | ICD-10-CM | POA: Diagnosis not present

## 2016-11-21 DIAGNOSIS — I2510 Atherosclerotic heart disease of native coronary artery without angina pectoris: Secondary | ICD-10-CM | POA: Diagnosis not present

## 2016-11-21 DIAGNOSIS — E1165 Type 2 diabetes mellitus with hyperglycemia: Secondary | ICD-10-CM | POA: Diagnosis not present

## 2016-11-21 DIAGNOSIS — B351 Tinea unguium: Secondary | ICD-10-CM | POA: Diagnosis not present

## 2016-11-21 DIAGNOSIS — J4520 Mild intermittent asthma, uncomplicated: Secondary | ICD-10-CM | POA: Diagnosis not present

## 2016-12-01 DIAGNOSIS — E119 Type 2 diabetes mellitus without complications: Secondary | ICD-10-CM | POA: Diagnosis not present

## 2016-12-01 DIAGNOSIS — S90121A Contusion of right lesser toe(s) without damage to nail, initial encounter: Secondary | ICD-10-CM | POA: Diagnosis not present

## 2016-12-01 DIAGNOSIS — S9031XA Contusion of right foot, initial encounter: Secondary | ICD-10-CM | POA: Diagnosis not present

## 2016-12-01 DIAGNOSIS — B351 Tinea unguium: Secondary | ICD-10-CM | POA: Diagnosis not present

## 2016-12-01 DIAGNOSIS — M722 Plantar fascial fibromatosis: Secondary | ICD-10-CM | POA: Diagnosis not present

## 2016-12-11 DIAGNOSIS — G4733 Obstructive sleep apnea (adult) (pediatric): Secondary | ICD-10-CM | POA: Diagnosis not present

## 2016-12-11 DIAGNOSIS — G4710 Hypersomnia, unspecified: Secondary | ICD-10-CM | POA: Diagnosis not present

## 2016-12-31 DIAGNOSIS — G4733 Obstructive sleep apnea (adult) (pediatric): Secondary | ICD-10-CM | POA: Diagnosis not present

## 2017-01-02 DIAGNOSIS — E119 Type 2 diabetes mellitus without complications: Secondary | ICD-10-CM | POA: Diagnosis not present

## 2017-01-02 DIAGNOSIS — H35342 Macular cyst, hole, or pseudohole, left eye: Secondary | ICD-10-CM | POA: Diagnosis not present

## 2017-01-06 DIAGNOSIS — R0602 Shortness of breath: Secondary | ICD-10-CM | POA: Diagnosis not present

## 2017-01-06 DIAGNOSIS — G4733 Obstructive sleep apnea (adult) (pediatric): Secondary | ICD-10-CM | POA: Diagnosis not present

## 2017-01-06 DIAGNOSIS — J4520 Mild intermittent asthma, uncomplicated: Secondary | ICD-10-CM | POA: Diagnosis not present

## 2017-01-27 DIAGNOSIS — G4710 Hypersomnia, unspecified: Secondary | ICD-10-CM | POA: Diagnosis not present

## 2017-01-27 DIAGNOSIS — G4733 Obstructive sleep apnea (adult) (pediatric): Secondary | ICD-10-CM | POA: Diagnosis not present

## 2017-02-03 DIAGNOSIS — I10 Essential (primary) hypertension: Secondary | ICD-10-CM | POA: Diagnosis not present

## 2017-02-03 DIAGNOSIS — J309 Allergic rhinitis, unspecified: Secondary | ICD-10-CM | POA: Diagnosis not present

## 2017-02-03 DIAGNOSIS — K5900 Constipation, unspecified: Secondary | ICD-10-CM | POA: Diagnosis not present

## 2017-02-03 DIAGNOSIS — E1165 Type 2 diabetes mellitus with hyperglycemia: Secondary | ICD-10-CM | POA: Diagnosis not present

## 2017-02-27 DIAGNOSIS — I2510 Atherosclerotic heart disease of native coronary artery without angina pectoris: Secondary | ICD-10-CM | POA: Diagnosis not present

## 2017-02-27 DIAGNOSIS — Z0001 Encounter for general adult medical examination with abnormal findings: Secondary | ICD-10-CM | POA: Diagnosis not present

## 2017-02-27 DIAGNOSIS — E1165 Type 2 diabetes mellitus with hyperglycemia: Secondary | ICD-10-CM | POA: Diagnosis not present

## 2017-02-27 DIAGNOSIS — I10 Essential (primary) hypertension: Secondary | ICD-10-CM | POA: Diagnosis not present

## 2017-02-27 DIAGNOSIS — K5900 Constipation, unspecified: Secondary | ICD-10-CM | POA: Diagnosis not present

## 2017-04-30 DIAGNOSIS — I2510 Atherosclerotic heart disease of native coronary artery without angina pectoris: Secondary | ICD-10-CM | POA: Diagnosis not present

## 2017-04-30 DIAGNOSIS — E782 Mixed hyperlipidemia: Secondary | ICD-10-CM | POA: Diagnosis not present

## 2017-04-30 DIAGNOSIS — G4733 Obstructive sleep apnea (adult) (pediatric): Secondary | ICD-10-CM | POA: Diagnosis not present

## 2017-04-30 DIAGNOSIS — I10 Essential (primary) hypertension: Secondary | ICD-10-CM | POA: Diagnosis not present

## 2017-05-14 DIAGNOSIS — E1142 Type 2 diabetes mellitus with diabetic polyneuropathy: Secondary | ICD-10-CM | POA: Diagnosis not present

## 2017-05-14 DIAGNOSIS — E1165 Type 2 diabetes mellitus with hyperglycemia: Secondary | ICD-10-CM | POA: Diagnosis not present

## 2017-05-14 DIAGNOSIS — I10 Essential (primary) hypertension: Secondary | ICD-10-CM | POA: Diagnosis not present

## 2017-05-14 DIAGNOSIS — D649 Anemia, unspecified: Secondary | ICD-10-CM | POA: Diagnosis not present

## 2017-06-19 DIAGNOSIS — E1165 Type 2 diabetes mellitus with hyperglycemia: Secondary | ICD-10-CM | POA: Diagnosis not present

## 2017-06-19 DIAGNOSIS — D649 Anemia, unspecified: Secondary | ICD-10-CM | POA: Diagnosis not present

## 2017-08-28 ENCOUNTER — Encounter: Payer: Self-pay | Admitting: Nurse Practitioner

## 2017-08-28 ENCOUNTER — Ambulatory Visit (INDEPENDENT_AMBULATORY_CARE_PROVIDER_SITE_OTHER): Payer: Medicare Other | Admitting: Nurse Practitioner

## 2017-08-28 VITALS — BP 132/88 | HR 70 | Resp 16 | Ht 66.0 in | Wt 168.0 lb

## 2017-08-28 DIAGNOSIS — I2510 Atherosclerotic heart disease of native coronary artery without angina pectoris: Secondary | ICD-10-CM | POA: Diagnosis not present

## 2017-08-28 DIAGNOSIS — J4520 Mild intermittent asthma, uncomplicated: Secondary | ICD-10-CM

## 2017-08-28 DIAGNOSIS — E1165 Type 2 diabetes mellitus with hyperglycemia: Secondary | ICD-10-CM

## 2017-08-28 LAB — POCT GLYCOSYLATED HEMOGLOBIN (HGB A1C): Hemoglobin A1C: 8

## 2017-08-28 MED ORDER — METFORMIN HCL 1000 MG PO TABS: 1000.0000 mg | ORAL_TABLET | Freq: Two times a day (BID) | ORAL | 1 refills | Status: DC

## 2017-08-28 MED ORDER — IPRATROPIUM-ALBUTEROL 0.5-2.5 (3) MG/3ML IN SOLN: 3.0000 mL | Freq: Four times a day (QID) | RESPIRATORY_TRACT | 3 refills | Status: DC | PRN

## 2017-08-28 NOTE — Progress Notes (Signed)
Subjective:     Patient ID: Daniel Hodges, male   DOB: Aug 09, 1941, 76 y.o.   MRN: [id number]   The patient is c/o cough at night. No wheezing or shortness of breath. This is intermittent. Does not affect him every night. Has not been using his nebulizer when symptoms occur. Blood sugars are elevated, however, slightly improved from recent visit. Long discussion about meds/diet choices .     Review of Systems  Constitutional: Negative.  Negative for unexpected weight change.  HENT: Negative.   Eyes: Negative.   Respiratory: Positive for cough. Negative for chest tightness, shortness of breath and wheezing.   Cardiovascular: Negative for chest pain, palpitations and leg swelling.  Gastrointestinal: Positive for constipation.  Endocrine:       Slightly improved blood sugars from last visit.   Musculoskeletal: Negative.   Skin: Negative.   Allergic/Immunologic: Negative.   Neurological: Negative.   Hematological: Negative.   Psychiatric/Behavioral: Negative.        Objective:   Physical Exam  Constitutional: He is oriented to person, place, and time. He appears well-developed and well-nourished.  HENT:  Head: Normocephalic and atraumatic.  Eyes: Pupils are equal, round, and reactive to light.  Neck: Normal range of motion. Neck supple. No JVD present.  Cardiovascular: Normal rate and regular rhythm.  Murmur heard. Pulmonary/Chest: Effort normal and breath sounds normal.  Abdominal: Soft. Bowel sounds are normal.  Musculoskeletal: Normal range of motion.  Neurological: He is alert and oriented to person, place, and time.  Skin: Skin is warm and dry.  Psychiatric: He has a normal mood and affect. His behavior is normal.       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 DM2, uncontrolled - HgbA1c 8.0 today. Reviewed medications and dosages. Discussed improving diet choices to help lower sugar without changing medications 2. Intermittent asthma - continue symbicort twice daily. Use neb treatments as needed and as prescribed.  Encouraged him to use in the evenings to prevent cough 3. Coronary artery disease - continue medications as prescribed. Regular visits with  Cardiology as scheduleld.   Follow up 3 months and sooner if needed

## 2017-08-30 ENCOUNTER — Encounter: Payer: Self-pay | Admitting: Nurse Practitioner

## 2017-11-02 ENCOUNTER — Telehealth: Payer: Self-pay

## 2017-11-02 NOTE — Telephone Encounter (Signed)
TRIED CALLING PT TO [facility]D APPT DUE TO HOME HEALTH [facility] CALLING CONCERNING HEART MURMUR AD HEAD COLD. PT NEEDS TO BE SEEN / BR

## 2017-11-17 DIAGNOSIS — G4710 Hypersomnia, unspecified: Secondary | ICD-10-CM | POA: Diagnosis not present

## 2017-11-17 DIAGNOSIS — G4733 Obstructive sleep apnea (adult) (pediatric): Secondary | ICD-10-CM | POA: Diagnosis not present

## 2017-11-18 ENCOUNTER — Other Ambulatory Visit: Payer: Self-pay | Admitting: Internal Medicine

## 2017-12-04 ENCOUNTER — Ambulatory Visit: Payer: Medicare Other | Admitting: Nurse Practitioner

## 2017-12-04 ENCOUNTER — Encounter: Payer: Self-pay | Admitting: Nurse Practitioner

## 2017-12-04 VITALS — BP 130/77 | HR 78 | Resp 16 | Ht 66.0 in | Wt 168.0 lb

## 2017-12-04 DIAGNOSIS — I2510 Atherosclerotic heart disease of native coronary artery without angina pectoris: Secondary | ICD-10-CM

## 2017-12-04 DIAGNOSIS — E1165 Type 2 diabetes mellitus with hyperglycemia: Secondary | ICD-10-CM | POA: Diagnosis not present

## 2017-12-04 DIAGNOSIS — E782 Mixed hyperlipidemia: Secondary | ICD-10-CM | POA: Diagnosis not present

## 2017-12-04 DIAGNOSIS — J069 Acute upper respiratory infection, unspecified: Secondary | ICD-10-CM

## 2017-12-04 DIAGNOSIS — Z23 Encounter for immunization: Secondary | ICD-10-CM | POA: Diagnosis not present

## 2017-12-04 MED ORDER — AZITHROMYCIN 250 MG PO TABS: ORAL_TABLET | ORAL | 0 refills | Status: DC

## 2017-12-04 NOTE — Progress Notes (Signed)
[facility] [place] [place], [place] [address]  Internal MEDICINE  Office Visit Note  Patient Name: Daniel Hodges  [id number]  [id number]  Date of Service: 12/27/2017  Chief Complaint  Patient presents with  . Cough  . Diabetes    a1c done on 10/30/17 by insurance company 6.8 result     The patient is here for routine follow up visit. He states that blood sugars have been elevated. Generally running in high 100s. Home health nurse, [facility], came out to his home 10/30/2017. HgbA1c was 6.8 at that visit.  He is complaining of cough. Has been present for about 2 weeks. He does have nasal congestion, but otherwise he states that he is feeling ok.    Pt is here for routine follow up.    Current Medication: Outpatient Encounter Medications as of 12/04/2017  Medication Sig  . aspirin 81 MG tablet Take 81 mg by mouth daily.  Marland Kitchen atorvastatin (LIPITOR) 20 MG tablet TAKE 1 TABLET BY MOUTH AT BEDTIME  . Dulaglutide (TRULICITY) 1.5 TI/4.5YK SOPN Inject 1.5 mg into the skin every 7 (seven) days.   . ferrous sulfate 325 (65 FE) MG tablet Take 325 mg by mouth 2 (two) times daily with a meal.  . furosemide (LASIX) 40 MG tablet Take 40 mg by mouth.  . gabapentin (NEURONTIN) 300 MG capsule Take 300 mg by mouth at bedtime.  Marland Kitchen glipiZIDE (GLUCOTROL) 10 MG tablet Take 10 mg by mouth daily before breakfast.  . ipratropium-albuterol (DUONEB) 0.5-2.5 (3) MG/3ML SOLN Take 3 mLs by nebulization every 6 (six) hours as needed.  . metFORMIN (GLUCOPHAGE) 1000 MG tablet Take 1 tablet (1,000 mg total) by mouth 2 (two) times daily with a meal.  . Multiple Vitamin (MULTIVITAMIN WITH MINERALS) TABS tablet Take 1 tablet by mouth daily.  Marland Kitchen senna (SENOKOT) 8.6 MG tablet Take 1 tablet by mouth daily.  . [DISCONTINUED] azithromycin (ZITHROMAX) 250 MG tablet z-pack - take as directed for 5 days (Patient not taking: Reported on 12/20/2017)  . [DISCONTINUED] montelukast (SINGULAIR) 10 MG tablet Take 10  mg by mouth at bedtime.  . [DISCONTINUED] simethicone (MYLICON) 80 MG chewable tablet Chew 80 mg by mouth every 6 (six) hours as needed for flatulence.   No facility-administered encounter medications on file as of 12/04/2017.     Surgical History: Past Surgical History:  Procedure Laterality Date  . APPENDECTOMY    . COLONOSCOPY WITH PROPOFOL N/A 06/11/2015   Procedure: COLONOSCOPY WITH PROPOFOL;  Surgeon: Manya Silvas, MD;  Location: [facility] ENDOSCOPY;  Service: Endoscopy;  Laterality: N/A;  . CORONARY ARTERY BYPASS GRAFT    . HERNIA REPAIR    . TEE WITHOUT CARDIOVERSION    . TRACHEOSTOMY    . VASCULAR SURGERY      Medical History: Past Medical History:  Diagnosis Date  . Anginal pain ([place])   . Asthma   . Coronary artery disease   . Diabetes mellitus without complication ([place])   . Hyperlipidemia   . Hypertension   . Sleep apnea     Family History: Family History  Problem Relation Age of Onset  . Cancer Sister     Social History   Socioeconomic History  . Marital status: Married    Spouse name: Not on file  . Number of children: Not on file  . Years of education: Not on file  . Highest education level: Not on file  Occupational History  . Not on file  Social Needs  .  Financial resource strain: Not on file  . Food insecurity:    Worry: Not on file    Inability: Not on file  . Transportation needs:    Medical: Not on file    Non-medical: Not on file  Tobacco Use  . Smoking status: Never Smoker  . Smokeless tobacco: Never Used  Substance and Sexual Activity  . Alcohol use: No  . Drug use: No  . Sexual activity: Not on file  Lifestyle  . Physical activity:    Days per week: Not on file    Minutes per session: Not on file  . Stress: Not on file  Relationships  . Social connections:    Talks on phone: Not on file    Gets together: Not on file    Attends religious service: Not on file    Active member of club or organization: Not on file    Attends  meetings of clubs or organizations: Not on file    Relationship status: Not on file  . Intimate partner violence:    Fear of current or ex partner: Not on file    Emotionally abused: Not on file    Physically abused: Not on file    Forced sexual activity: Not on file  Other Topics Concern  . Not on file  Social History Narrative  . Not on file      Review of Systems  Constitutional: Negative for activity change, chills and unexpected weight change.  HENT: Positive for congestion, postnasal drip and rhinorrhea. Negative for sinus pressure, sinus pain, sore throat and voice change.   Eyes: Negative.   Respiratory: Positive for cough. Negative for chest tightness, shortness of breath and wheezing.   Cardiovascular: Negative for chest pain, palpitations and leg swelling.  Gastrointestinal: Positive for constipation. Negative for vomiting.  Endocrine:       Slightly improved blood sugars from last visit.   Genitourinary: Negative.   Musculoskeletal: Negative for arthralgias, back pain and myalgias.  Skin: Negative for rash.  Allergic/Immunologic: Negative for environmental allergies.  Neurological: Negative for weakness and headaches.  Hematological: Negative for adenopathy.  Psychiatric/Behavioral: Negative for dysphoric mood. The patient is not nervous/anxious.     Today's Vitals   12/04/17 1040  BP: 130/77  Pulse: 78  Resp: 16  SpO2: 96%  Weight: 168 lb (76.2 kg)  Height: 5\' 6"  (1.676 m)    Physical Exam  Constitutional: He is oriented to person, place, and time. He appears well-developed and well-nourished.  HENT:  Head: Normocephalic and atraumatic.  Nose: Rhinorrhea present. Right sinus exhibits frontal sinus tenderness. Left sinus exhibits frontal sinus tenderness.  Eyes: Pupils are equal, round, and reactive to light. EOM are normal.  Neck: Normal range of motion. Neck supple. No JVD present. No thyromegaly present.  Cardiovascular: Normal rate and regular rhythm.   Murmur heard. Pulmonary/Chest: Effort normal and breath sounds normal. He has no wheezes.  Congested, non-productive cough present.   Abdominal: Soft. Bowel sounds are normal. There is no tenderness.  Musculoskeletal: Normal range of motion.  Lymphadenopathy:    He has no cervical adenopathy.  Neurological: He is alert and oriented to person, place, and time.  Skin: Skin is warm and dry.  Psychiatric: He has a normal mood and affect. His behavior is normal. Judgment and thought content normal.  Nursing note and vitals reviewed.  Assessment/Plan:  1. Uncontrolled type 2 diabetes mellitus with hyperglycemia (HCC) Most recent HgbA1c 8.0. Recommend close adherence to ADA diet, decreasing intake  of carbohydrates and sugar. conitnue diabetic medication as prescribed.   2. Acute upper respiratory infection z-pack prescribed. Take as directed for 5 days. Rest and increase fluids.   3. Coronary artery disease involving native heart without angina pectoris, unspecified vessel or lesion type Continue regular visits with cardiology as scheduled.   4. Mixed hyperlipidemia Fasting lipid panel ordered. Continue atorvastatin as prescribed.   5. Need for vaccination against Streptococcus pneumoniae using pneumococcal conjugate vaccine 13 - Pneumococcal conjugate vaccine 13-valent IM   General Counseling: Oryn verbalizes understanding of the findings of todays visit and agrees with plan of treatment. I have discussed any further diagnostic evaluation that may be needed or ordered today. We also reviewed his medications today. he has been encouraged to call the office with any questions or concerns that should arise related to todays visit.   Diabetes Counseling:  1. Addition of ACE inh/ ARB'S for nephroprotection. 2. Diabetic foot care, prevention of complications.  3.Exercise and lose weight.  4. Diabetic eye examination, 5. Monitor blood sugar closlely. nutrition counseling.  6.Sign  and symptoms of hypoglycemia including shaking sweating,confusion and headaches.  This patient was seen by Leretha Pol, FNP- C in Collaboration with Dr Lavera Guise as a part of collaborative care agreement    Orders Placed This Encounter  Procedures  . Pneumococcal conjugate vaccine 13-valent IM    Meds ordered this encounter  Medications  . DISCONTD: azithromycin (ZITHROMAX) 250 MG tablet    Sig: z-pack - take as directed for 5 days    Dispense:  6 tablet    Refill:  0    Order Specific Question:   Supervising Provider    Answer:   Lavera Guise [[id number]]    Time spent: 89 Minutes       Dr Lavera Guise Internal medicine

## 2017-12-07 ENCOUNTER — Telehealth: Payer: Self-pay

## 2017-12-07 NOTE — Telephone Encounter (Signed)
Patient dropped off jury letter for [place] to complete.Perley Jain

## 2017-12-10 ENCOUNTER — Telehealth: Payer: Self-pay

## 2017-12-10 NOTE — Telephone Encounter (Signed)
Left message advising patient that his jury duty letter is ready for pickup.Daniel Hodges

## 2017-12-20 ENCOUNTER — Emergency Department: Payer: Medicare Other

## 2017-12-20 ENCOUNTER — Inpatient Hospital Stay
Admission: EM | Admit: 2017-12-20 | Discharge: 2017-12-22 | DRG: 683 | Disposition: A | Discharge status: Home / Self Care | Payer: Medicare Other | Attending: Specialist | Admitting: Specialist

## 2017-12-20 ENCOUNTER — Other Ambulatory Visit: Payer: Self-pay

## 2017-12-20 ENCOUNTER — Encounter: Payer: Self-pay | Admitting: Emergency Medicine

## 2017-12-20 DIAGNOSIS — R109 Unspecified abdominal pain: Secondary | ICD-10-CM

## 2017-12-20 DIAGNOSIS — Z7984 Long term (current) use of oral hypoglycemic drugs: Secondary | ICD-10-CM | POA: Diagnosis not present

## 2017-12-20 DIAGNOSIS — K567 Ileus, unspecified: Secondary | ICD-10-CM

## 2017-12-20 DIAGNOSIS — Z7982 Long term (current) use of aspirin: Secondary | ICD-10-CM

## 2017-12-20 DIAGNOSIS — I2510 Atherosclerotic heart disease of native coronary artery without angina pectoris: Secondary | ICD-10-CM | POA: Diagnosis present

## 2017-12-20 DIAGNOSIS — K529 Noninfective gastroenteritis and colitis, unspecified: Secondary | ICD-10-CM | POA: Diagnosis present

## 2017-12-20 DIAGNOSIS — G4733 Obstructive sleep apnea (adult) (pediatric): Secondary | ICD-10-CM | POA: Diagnosis present

## 2017-12-20 DIAGNOSIS — E1140 Type 2 diabetes mellitus with diabetic neuropathy, unspecified: Secondary | ICD-10-CM | POA: Diagnosis present

## 2017-12-20 DIAGNOSIS — Z79899 Other long term (current) drug therapy: Secondary | ICD-10-CM

## 2017-12-20 DIAGNOSIS — E785 Hyperlipidemia, unspecified: Secondary | ICD-10-CM | POA: Diagnosis not present

## 2017-12-20 DIAGNOSIS — J45909 Unspecified asthma, uncomplicated: Secondary | ICD-10-CM | POA: Diagnosis present

## 2017-12-20 DIAGNOSIS — J9811 Atelectasis: Secondary | ICD-10-CM | POA: Diagnosis not present

## 2017-12-20 DIAGNOSIS — Z951 Presence of aortocoronary bypass graft: Secondary | ICD-10-CM

## 2017-12-20 DIAGNOSIS — K5900 Constipation, unspecified: Secondary | ICD-10-CM | POA: Diagnosis not present

## 2017-12-20 DIAGNOSIS — N179 Acute kidney failure, unspecified: Principal | ICD-10-CM | POA: Diagnosis present

## 2017-12-20 DIAGNOSIS — I482 Chronic atrial fibrillation: Secondary | ICD-10-CM | POA: Diagnosis not present

## 2017-12-20 DIAGNOSIS — E119 Type 2 diabetes mellitus without complications: Secondary | ICD-10-CM

## 2017-12-20 DIAGNOSIS — R1030 Lower abdominal pain, unspecified: Secondary | ICD-10-CM | POA: Diagnosis not present

## 2017-12-20 LAB — LACTIC ACID, PLASMA: Lactic Acid, Venous: 1.9 mmol/L (ref 0.5–1.9)

## 2017-12-20 LAB — TYPE AND SCREEN
ABO/RH(D): A POS
Antibody Screen: NEGATIVE

## 2017-12-20 LAB — COMPREHENSIVE METABOLIC PANEL
ALK PHOS: 91 U/L (ref 38–126)
ALT: 16 U/L — AB (ref 17–63)
AST: 22 U/L (ref 15–41)
Albumin: 4.4 g/dL (ref 3.5–5.0)
Anion gap: 9 (ref 5–15)
BUN: 16 mg/dL (ref 6–20)
CALCIUM: 9.2 mg/dL (ref 8.9–10.3)
CHLORIDE: 101 mmol/L (ref 101–111)
CO2: 24 mmol/L (ref 22–32)
CREATININE: 1.28 mg/dL — AB (ref 0.61–1.24)
GFR calc non Af Amer: 53 mL/min — ABNORMAL LOW (ref >60)
Glucose, Bld: 208 mg/dL — ABNORMAL HIGH (ref 65–99)
Potassium: 4.8 mmol/L (ref 3.5–5.1)
SODIUM: 134 mmol/L — AB (ref 135–145)
Total Bilirubin: 0.9 mg/dL (ref 0.3–1.2)
Total Protein: 7.8 g/dL (ref 6.5–8.1)

## 2017-12-20 LAB — CBC
HCT: 39.5 % — ABNORMAL LOW (ref 40.0–52.0)
Hemoglobin: 13 g/dL (ref 13.0–18.0)
MCH: 27 pg (ref 26.0–34.0)
MCHC: 33 g/dL (ref 32.0–36.0)
MCV: 81.7 fL (ref 80.0–100.0)
PLATELETS: 231 10*3/uL (ref 150–440)
RBC: 4.84 MIL/uL (ref 4.40–5.90)
RDW: 14.7 % — ABNORMAL HIGH (ref 11.5–14.5)
WBC: 11.2 10*3/uL — ABNORMAL HIGH (ref 3.8–10.6)

## 2017-12-20 LAB — PROTIME-INR
INR: 1.02
Prothrombin Time: 13.3 seconds (ref 11.4–15.2)

## 2017-12-20 LAB — GLUCOSE, CAPILLARY
GLUCOSE-CAPILLARY: 249 mg/dL — AB (ref 65–99)
Glucose-Capillary: 205 mg/dL — ABNORMAL HIGH (ref 65–99)

## 2017-12-20 LAB — LIPASE, BLOOD: LIPASE: 38 U/L (ref 11–51)

## 2017-12-20 LAB — TROPONIN I

## 2017-12-20 MED ORDER — INSULIN ASPART 100 UNIT/ML ~~LOC~~ SOLN
0.0000 [IU] | Freq: Every day | SUBCUTANEOUS | Status: DC
  Administered 2017-12-20: 2 [IU] via SUBCUTANEOUS
  Administered 2017-12-21: 3 [IU] via SUBCUTANEOUS
  Filled 2017-12-20 (×2): qty 1

## 2017-12-20 MED ORDER — FERROUS SULFATE 325 (65 FE) MG PO TABS
325.0000 mg | ORAL_TABLET | Freq: Two times a day (BID) | ORAL | Status: DC
  Administered 2017-12-21 – 2017-12-22 (×3): 325 mg via ORAL
  Filled 2017-12-20 (×3): qty 1

## 2017-12-20 MED ORDER — MONTELUKAST SODIUM 10 MG PO TABS
10.0000 mg | ORAL_TABLET | Freq: Every day | ORAL | Status: DC
  Administered 2017-12-20 – 2017-12-21 (×2): 10 mg via ORAL
  Filled 2017-12-20 (×2): qty 1

## 2017-12-20 MED ORDER — ADULT MULTIVITAMIN W/MINERALS CH
1.0000 | ORAL_TABLET | Freq: Every day | ORAL | Status: DC
  Administered 2017-12-21 – 2017-12-22 (×2): 1 via ORAL
  Filled 2017-12-20 (×2): qty 1

## 2017-12-20 MED ORDER — ONDANSETRON HCL 4 MG PO TABS
4.0000 mg | ORAL_TABLET | Freq: Four times a day (QID) | ORAL | Status: DC | PRN
  Administered 2017-12-21: 4 mg via ORAL
  Filled 2017-12-20: qty 1

## 2017-12-20 MED ORDER — GABAPENTIN 300 MG PO CAPS
300.0000 mg | ORAL_CAPSULE | Freq: Every day | ORAL | Status: DC
  Administered 2017-12-20 – 2017-12-21 (×2): 300 mg via ORAL
  Filled 2017-12-20 (×2): qty 1

## 2017-12-20 MED ORDER — INSULIN ASPART 100 UNIT/ML ~~LOC~~ SOLN
0.0000 [IU] | Freq: Three times a day (TID) | SUBCUTANEOUS | Status: DC
  Administered 2017-12-21 (×2): 2 [IU] via SUBCUTANEOUS
  Administered 2017-12-21 – 2017-12-22 (×2): 3 [IU] via SUBCUTANEOUS
  Administered 2017-12-22: 2 [IU] via SUBCUTANEOUS
  Filled 2017-12-20 (×5): qty 1

## 2017-12-20 MED ORDER — IPRATROPIUM-ALBUTEROL 0.5-2.5 (3) MG/3ML IN SOLN: 3.0000 mL | Freq: Four times a day (QID) | RESPIRATORY_TRACT | Status: DC | PRN

## 2017-12-20 MED ORDER — FENTANYL CITRATE (PF) 100 MCG/2ML IJ SOLN
50.0000 ug | Freq: Once | INTRAMUSCULAR | Status: AC
  Administered 2017-12-20: 50 ug via INTRAVENOUS
  Filled 2017-12-20: qty 2

## 2017-12-20 MED ORDER — ASPIRIN EC 81 MG PO TBEC
81.0000 mg | DELAYED_RELEASE_TABLET | Freq: Every day | ORAL | Status: DC
  Administered 2017-12-21 – 2017-12-22 (×2): 81 mg via ORAL
  Filled 2017-12-20 (×3): qty 1

## 2017-12-20 MED ORDER — MAGNESIUM CITRATE PO SOLN
1.0000 | Freq: Once | ORAL | Status: AC | PRN
  Administered 2017-12-21: 12:00:00 1 via ORAL
  Filled 2017-12-20 (×2): qty 296

## 2017-12-20 MED ORDER — ONDANSETRON HCL 4 MG/2ML IJ SOLN: 4.0000 mg | Freq: Four times a day (QID) | INTRAMUSCULAR | Status: DC | PRN

## 2017-12-20 MED ORDER — DULAGLUTIDE 1.5 MG/0.5ML ~~LOC~~ SOAJ: 1.5000 mg | SUBCUTANEOUS | Status: DC

## 2017-12-20 MED ORDER — FLEET ENEMA 7-19 GM/118ML RE ENEM
1.0000 | ENEMA | Freq: Once | RECTAL | Status: AC
  Administered 2017-12-20: 1 via RECTAL

## 2017-12-20 MED ORDER — SODIUM CHLORIDE 0.9 % IV SOLN
INTRAVENOUS | Status: DC
  Administered 2017-12-20 – 2017-12-21 (×2): via INTRAVENOUS

## 2017-12-20 MED ORDER — ATORVASTATIN CALCIUM 20 MG PO TABS
20.0000 mg | ORAL_TABLET | Freq: Every day | ORAL | Status: DC
  Administered 2017-12-20 – 2017-12-21 (×2): 20 mg via ORAL
  Filled 2017-12-20 (×2): qty 1

## 2017-12-20 MED ORDER — GLIPIZIDE 10 MG PO TABS: 10.0000 mg | ORAL_TABLET | Freq: Every day | ORAL | Status: DC

## 2017-12-20 MED ORDER — SENNA 8.6 MG PO TABS
1.0000 | ORAL_TABLET | Freq: Two times a day (BID) | ORAL | Status: DC
  Administered 2017-12-20: 23:00:00 8.6 mg via ORAL
  Filled 2017-12-20 (×3): qty 1

## 2017-12-20 MED ORDER — DIPHENHYDRAMINE HCL 25 MG PO CAPS
25.0000 mg | ORAL_CAPSULE | Freq: Every evening | ORAL | Status: DC | PRN
  Administered 2017-12-20 – 2017-12-21 (×2): 25 mg via ORAL
  Filled 2017-12-20 (×2): qty 1

## 2017-12-20 MED ORDER — ENOXAPARIN SODIUM 30 MG/0.3ML ~~LOC~~ SOLN
30.0000 mg | SUBCUTANEOUS | Status: DC
  Administered 2017-12-20: 30 mg via SUBCUTANEOUS
  Filled 2017-12-20: qty 0.3

## 2017-12-20 NOTE — ED Notes (Signed)
Report finished att 

## 2017-12-20 NOTE — ED Notes (Signed)
Wilfred Lacy, EDT, called for transport - already transporting att

## 2017-12-20 NOTE — ED Notes (Addendum)
Pt called out for difficulty breathing, NAD on assessment pt speaking in complete sentences  Pt and wife reports pt's abdominal pain and inability to defecate  Wife leaving, call [phone], with dispo

## 2017-12-20 NOTE — ED Notes (Signed)
Pt wife called with room assignment update, pt to toilet after enema with marginal relief from stool burden

## 2017-12-20 NOTE — ED Triage Notes (Signed)
Arrives c/o abdominal pain -- onset 1300 today. Patient pale and diaphoretic.  Denies CP.

## 2017-12-20 NOTE — Progress Notes (Signed)
Family Meeting Note  Advance Directive:yes  Today a meeting took place with the Patient.    The following clinical team members were present during this meeting:MD  The following were discussed:Patient's diagnosis: Severe abdominal pain acute, severe constipation, dizziness, AKI, history of coronary artery disease, hypertension, hyperlipidemia and other medical problems including diabetic neuropathy and diabetes mellitus was discussed and treatment plan of care was discussed in detail with the patient.  He verbalized understanding of the plan, Patient's progosis: Unable to determine and Goals for treatment: Full Code, wife is healthcare POA  Additional follow-up to be provided: Hospitalist  Time spent during discussion:17 MIN  Daniel Mango, MD

## 2017-12-20 NOTE — H&P (Signed)
[facility] [place] NAME: Daniel Hodges    MR#:  [id number]  DATE OF BIRTH:  [birth year]  DATE OF ADMISSION:  12/20/2017  PRIMARY CARE PHYSICIAN: Lavera Guise, MD   REQUESTING/REFERRING PHYSICIAN: mc shane  CHIEF COMPLAINT:  abd pain and dizziness  HISTORY OF PRESENT ILLNESS:  Gar Glance  is a 77 y.o. male with a known history of coronary artery disease, hypertension, hyperlipidemia, diabetes mellitus, diabetic neuropathy, surgery on his abdomen for an abscess is presenting to the ED with a chief complaint of acute onset of abdominal pain from this afternoon.  Denies any diarrhea nausea or vomiting.  Reports his last bowel movement was today morning which was very hard.  Denies any fever.  CT abdomen has revealed colon fiilled with large amount of stool.  Patient was seen by surgery Dr. Rosana Hoes, he has signed off as it is not a surgical abdomen.  Gastroenterology Dr. Alice Reichert has recommended bowel prep if no good bowel movement with enema.  Patient is reporting dizziness but denies any loss of consciousness.  No family members at bedside  PAST MEDICAL HISTORY:   Past Medical History:  Diagnosis Date  . Anginal pain ([place])   . Asthma   . Coronary artery disease   . Diabetes mellitus without complication ([place])   . Hyperlipidemia   . Hypertension   . Sleep apnea     PAST SURGICAL HISTOIRY:   Past Surgical History:  Procedure Laterality Date  . APPENDECTOMY    . COLONOSCOPY WITH PROPOFOL N/A 06/11/2015   Procedure: COLONOSCOPY WITH PROPOFOL;  Surgeon: Manya Silvas, MD;  Location: [facility] ENDOSCOPY;  Service: Endoscopy;  Laterality: N/A;  . CORONARY ARTERY BYPASS GRAFT    . HERNIA REPAIR    . TEE WITHOUT CARDIOVERSION    . TRACHEOSTOMY    . VASCULAR SURGERY      SOCIAL HISTORY:   Social History   Tobacco Use  . Smoking status: Never Smoker  . Smokeless tobacco: Never Used  Substance Use Topics  . Alcohol use: No     FAMILY HISTORY:   Family History  Problem Relation Age of Onset  . Cancer Sister     DRUG ALLERGIES:   Allergies  Allergen Reactions  . Penicillins Anaphylaxis  . Neostigmine     PEA arrest    REVIEW OF SYSTEMS:  CONSTITUTIONAL: No fever, fatigue or weakness.  EYES: No blurred or double vision.  EARS, NOSE, AND THROAT: No tinnitus or ear pain.  RESPIRATORY: No cough, shortness of breath, wheezing or hemoptysis.  CARDIOVASCULAR: No chest pain, orthopnea, edema.  Reporting dizziness GASTROINTESTINAL: No nausea, vomiting, diarrhea .  Reporting severe mid and lower abdominal pain.  GENITOURINARY: No dysuria, hematuria.  ENDOCRINE: No polyuria, nocturia,  HEMATOLOGY: No anemia, easy bruising or bleeding SKIN: No rash or lesion. MUSCULOSKELETAL: No joint pain or arthritis.   NEUROLOGIC: No tingling, numbness, weakness.  PSYCHIATRY: No anxiety or depression.   MEDICATIONS AT HOME:   Prior to Admission medications   Medication Sig Start Date End Date Taking? Authorizing Provider  aspirin 81 MG tablet Take 81 mg by mouth daily.   Yes [provider]  atorvastatin (LIPITOR) 20 MG tablet TAKE 1 TABLET BY MOUTH AT BEDTIME 11/18/17  Yes Boscia, Heather E, NP  Dulaglutide (TRULICITY) 1.5 KG/8.1EH SOPN Inject 1.5 mg into the skin every 7 (seven) days.    Yes [provider]  ferrous sulfate 325 (65 FE) MG tablet  Take 325 mg by mouth 2 (two) times daily with a meal.   Yes [provider]  furosemide (LASIX) 40 MG tablet Take 40 mg by mouth.   Yes [provider]  gabapentin (NEURONTIN) 300 MG capsule Take 300 mg by mouth at bedtime.   Yes [provider]  glipiZIDE (GLUCOTROL) 10 MG tablet Take 10 mg by mouth daily before breakfast.   Yes [provider]  ipratropium-albuterol (DUONEB) 0.5-2.5 (3) MG/3ML SOLN Take 3 mLs by nebulization every 6 (six) hours as needed. 08/28/17  Yes Ronnell Freshwater, NP  metFORMIN (GLUCOPHAGE) 1000 MG  tablet Take 1 tablet (1,000 mg total) by mouth 2 (two) times daily with a meal. 08/28/17  Yes Boscia, Heather E, NP  montelukast (SINGULAIR) 10 MG tablet Take 10 mg by mouth at bedtime.   Yes [provider]  Multiple Vitamin (MULTIVITAMIN WITH MINERALS) TABS tablet Take 1 tablet by mouth daily.   Yes [provider]  senna (SENOKOT) 8.6 MG tablet Take 1 tablet by mouth daily.   Yes [provider]  azithromycin (ZITHROMAX) 250 MG tablet z-pack - take as directed for 5 days Patient not taking: Reported on 12/20/2017 12/04/17   Ronnell Freshwater, NP      VITAL SIGNS:  Blood pressure 132/64, pulse 93, temperature 99.5 F (37.5 C), temperature source Oral, resp. rate 17, height 5\' 6"  (1.676 m), weight 76.2 kg (168 lb), SpO2 98 %.  PHYSICAL EXAMINATION:  GENERAL:  77 y.o.-year-old patient lying in the bed with no acute distress.  EYES: Pupils equal, round, reactive to light and accommodation. No scleral icterus. Extraocular muscles intact.  HEENT: Head atraumatic, normocephalic. Oropharynx and nasopharynx clear.  NECK:  Supple, no jugular venous distention. No thyroid enlargement, no tenderness.  LUNGS: Normal breath sounds bilaterally, no wheezing, rales,rhonchi or crepitation. No use of accessory muscles of respiration.  CARDIOVASCULAR: S1, S2 normal. No murmurs, rubs, or gallops.  ABDOMEN: Soft, nontender, distended. Bowel sounds present.  EXTREMITIES: No pedal edema, cyanosis, or clubbing.  NEUROLOGIC: Cranial nerves II through XII are intact. Muscle strength 5/5 in all extremities. Sensation intact. Gait not checked.  PSYCHIATRIC: The patient is alert and oriented x 3.  SKIN: No obvious rash, lesion, or ulcer.   LABORATORY PANEL:   CBC Recent Labs  Lab 12/20/17 1521  WBC 11.2*  HGB 13.0  HCT 39.5*  PLT 231   ------------------------------------------------------------------------------------------------------------------  Chemistries  Recent Labs  Lab  12/20/17 1521  NA 134*  K 4.8  CL 101  CO2 24  GLUCOSE 208*  BUN 16  CREATININE 1.28*  CALCIUM 9.2  AST 22  ALT 16*  ALKPHOS 91  BILITOT 0.9   ------------------------------------------------------------------------------------------------------------------  Cardiac Enzymes Recent Labs  Lab 12/20/17 1521  TROPONINI <0.03   ------------------------------------------------------------------------------------------------------------------  RADIOLOGY:  Dg Chest Port 1 View  Result Date: 12/20/2017 CLINICAL DATA:  Testing diffuse abdominal pain onset today. EXAM: PORTABLE CHEST 1 VIEW COMPARISON:  08/26/2016 FINDINGS: Prior median sternotomy and CABG. Low lung volumes. Mild cardiomegaly. No edema. Mild atelectasis or scarring at both lung bases. IMPRESSION: 1. No acute thoracic findings. 2. Mild atelectasis or scarring at the lung bases. 3. Cardiomegaly.  Prior CABG. Electronically Signed   By: Van Clines M.D.   On: 12/20/2017 15:59   Ct Renal Stone Study  Result Date: 12/20/2017 CLINICAL DATA:  Abdominal pain onset at 1 p.m. today. Pallor and diaphoresis. EXAM: CT ABDOMEN AND PELVIS WITHOUT CONTRAST TECHNIQUE: Multidetector CT imaging of the abdomen and  pelvis was performed following the standard protocol without IV contrast. COMPARISON:  Overlapping portions of CT chest from 01/07/2008 FINDINGS: Lower chest: Scarring in the right middle lobe and lingula. Coronary atherosclerotic calcification. Small type 1 hiatal hernia. Hepatobiliary: Unremarkable Pancreas: Scattered punctate calcifications in the pancreatic parenchyma compatible with chronic calcific pancreatitis. Spleen: Unremarkable Adrenals/Urinary Tract: Unremarkable Stomach/Bowel: Postoperative findings in the right colon. Multiple dilated loops of proximal small bowel are present some containing air-fluid levels at differing vertical levels. These lead to mid abdominal loops of small bowel which are mildly dilated and  mildly thick-walled. There is a gradual transition to nondilated ileum. A specific cause for obstruction is not identified. No abscess or extraluminal gas. No portal venous gas. Vascular/Lymphatic: Aortoiliac atherosclerotic vascular disease. No appreciable adenopathy. Reproductive: The prostate gland measures 6.2 by 4.2 by 5.3 cm (volume = 72 cm^3). Other: No supplemental non-categorized findings. Musculoskeletal: Median sternotomy. Indirect left inguinal hernia containing adipose tissues. No appreciable herniated bowel. IMPRESSION: 1. Multiple loops of dilated small proximal small bowel, with a gradual transition to normal caliber in an area where there is some mild bowel wall thickening but no obvious cause for obstruction/transition. Very subtle edema in the mesentery along the involved loops. There are some postoperative findings in the right colon but the intercalary anastomosis does not appear to be a site of obstruction. The appearance could be due to enteritis causing local ileus. 2. Other imaging findings of potential clinical significance: Aortoiliac atherosclerotic vascular disease. Coronary atherosclerosis. Small type 1 hiatal hernia. Chronic calcific pancreatitis. Prostatomegaly. Indirect left inguinal hernia contains adipose tissues (no herniated bowel). Electronically Signed   By: Van Clines M.D.   On: 12/20/2017 15:58    EKG:   Orders placed or performed in visit on 01/07/08  . EKG 12-Lead    IMPRESSION AND PLAN:   Daniel Hodges  is a 77 y.o. male with a known history of coronary artery disease, hypertension, hyperlipidemia, diabetes mellitus, diabetic neuropathy, surgery on his abdomen for an abscess is presenting to the ED with a chief complaint of acute onset of abdominal pain from this afternoon.  Denies any diarrhea nausea or vomiting.  Reports his last bowel movement was today morning which was very hard.  Denies any fever.  CT abdomen has revealed colon fiilled with  large amount of stool.  Patient was seen by surgery Dr. Rosana Hoes, he has signed off as it is not a surgical abdomen.  Gastroenterology Dr. Alice Reichert has recommended bowel prep if no good bowel movement with enema  #AKI Admit to MedSurg unit Hydrate with IV fluids Avoid nephrotoxins and repeat BMP in a.m.  #Acute abdominal pain from severe constipation Nonsurgical abdomen, patient was seen by surgery Dr. Rosana Hoes, signed him off Seen by gastroenterology Dr. Alice Reichert who is recommending bowel prep as patient has a large amount of stool filled in the colon We will try Senokot, milk of magnesia, soapsuds enema if no improvement patient will be benefited with bowel prep-probably GoLYTELY Pain management as needed  #Dizziness Hydrate with IV fluids, check orthostatics PT consult  #Diabetes mellitus hold metformin in view of AKI and continue glipizide. Check hemoglobin A1c and sliding scale insulin will be provided  #Hyperlipidemia continue Lipitor  #Diabetic neuropathy continue Neurontin   #Coronary artery disease patient denies any chest pain at this time. Continue home medications aspirin, statin    All the records are reviewed and case discussed with ED provider. Management plans discussed with the patient, family and they are in agreement.  CODE STATUS: FC   TOTAL TIME TAKING CARE OF THIS PATIENT: 43  minutes.   Note: This dictation was prepared with Dragon dictation along with smaller phrase technology. Any transcriptional errors that result from this process are unintentional.  Nicholes Mango M.D on 12/20/2017 at 7:56 PM  Between 7am to 6pm - Pager - [phone]  After 6pm go to [email or web address] - password [email or web address] [facility]  [facility]  Office  [phone]  CC: Primary care physician; Lavera Guise, MD

## 2017-12-20 NOTE — Consult Note (Addendum)
SURGICAL CONSULTATION NOTE (initial) - cpt: 85885  HISTORY OF PRESENT ILLNESS (HPI):  77 y.o. male presented to [facility] ED today for evaluation of crampy abdominal pain that began this afternoon. Patient reports he's been passing normal amounts of flatus and BM's, though describes his most recent BM this morning as "very hard", requiring him to strain. He also says his BM's alternate between hard and loose with frequent constipation, and he says his abdomen feels firm and slightly bigger than usual. Patient otherwise denies N/V, fever/chills, CP, or SOB.   Of note, in addition to patient's prior open appendectomy and what looks like open hernia repair, patient also describes an abdominal wall surgery in [place] 25 years ago for what his wife describes as a large 7 lb jelly ball that was removed, shortly after which patient required placement of an NG tube, but has not since.  Surgery is consulted by ED physician Dr. Burlene Arnt in this context for evaluation and management of possible small bowel obstruction.  PAST MEDICAL HISTORY (PMH):  Past Medical History:  Diagnosis Date  . Anginal pain ([place])   . Asthma   . Coronary artery disease   . Diabetes mellitus without complication ([place])   . Hyperlipidemia   . Hypertension   . Sleep apnea      PAST SURGICAL HISTORY [facility]):  Past Surgical History:  Procedure Laterality Date  . APPENDECTOMY    . COLONOSCOPY WITH PROPOFOL N/A 06/11/2015   Procedure: COLONOSCOPY WITH PROPOFOL;  Surgeon: Manya Silvas, MD;  Location: [facility] ENDOSCOPY;  Service: Endoscopy;  Laterality: N/A;  . CORONARY ARTERY BYPASS GRAFT    . HERNIA REPAIR    . TEE WITHOUT CARDIOVERSION    . TRACHEOSTOMY    . VASCULAR SURGERY       MEDICATIONS:  Prior to Admission medications   Medication Sig Start Date End Date Taking? Authorizing Provider  aspirin 81 MG tablet Take 81 mg by mouth daily.   Yes [provider]  atorvastatin (LIPITOR) 20 MG tablet TAKE 1 TABLET BY  MOUTH AT BEDTIME 11/18/17  Yes Boscia, Heather E, NP  Dulaglutide (TRULICITY) 1.5 OY/7.7AJ SOPN Inject 1.5 mg into the skin every 7 (seven) days.    Yes [provider]  ferrous sulfate 325 (65 FE) MG tablet Take 325 mg by mouth 2 (two) times daily with a meal.   Yes [provider]  furosemide (LASIX) 40 MG tablet Take 40 mg by mouth.   Yes [provider]  gabapentin (NEURONTIN) 300 MG capsule Take 300 mg by mouth at bedtime.   Yes [provider]  glipiZIDE (GLUCOTROL) 10 MG tablet Take 10 mg by mouth daily before breakfast.   Yes [provider]  ipratropium-albuterol (DUONEB) 0.5-2.5 (3) MG/3ML SOLN Take 3 mLs by nebulization every 6 (six) hours as needed. 08/28/17  Yes Ronnell Freshwater, NP  metFORMIN (GLUCOPHAGE) 1000 MG tablet Take 1 tablet (1,000 mg total) by mouth 2 (two) times daily with a meal. 08/28/17  Yes Boscia, Heather E, NP  montelukast (SINGULAIR) 10 MG tablet Take 10 mg by mouth at bedtime.   Yes [provider]  Multiple Vitamin (MULTIVITAMIN WITH MINERALS) TABS tablet Take 1 tablet by mouth daily.   Yes [provider]  senna (SENOKOT) 8.6 MG tablet Take 1 tablet by mouth daily.   Yes [provider]  azithromycin (ZITHROMAX) 250 MG tablet z-pack - take as directed for 5 days Patient not taking: Reported on 12/20/2017 12/04/17   Junius Creamer,  Greer Ee, NP     ALLERGIES:  Allergies  Allergen Reactions  . Penicillins Anaphylaxis  . Neostigmine     PEA arrest     SOCIAL HISTORY:  Social History   Socioeconomic History  . Marital status: Married    Spouse name: Not on file  . Number of children: Not on file  . Years of education: Not on file  . Highest education level: Not on file  Occupational History  . Not on file  Social Needs  . Financial resource strain: Not on file  . Food insecurity:    Worry: Not on file    Inability: Not on file  . Transportation needs:    Medical: Not on file     Non-medical: Not on file  Tobacco Use  . Smoking status: Never Smoker  . Smokeless tobacco: Never Used  Substance and Sexual Activity  . Alcohol use: No  . Drug use: No  . Sexual activity: Not on file  Lifestyle  . Physical activity:    Days per week: Not on file    Minutes per session: Not on file  . Stress: Not on file  Relationships  . Social connections:    Talks on phone: Not on file    Gets together: Not on file    Attends religious service: Not on file    Active member of club or organization: Not on file    Attends meetings of clubs or organizations: Not on file    Relationship status: Not on file  . Intimate partner violence:    Fear of current or ex partner: Not on file    Emotionally abused: Not on file    Physically abused: Not on file    Forced sexual activity: Not on file  Other Topics Concern  . Not on file  Social History Narrative  . Not on file    The patient currently resides (home / rehab facility / nursing home): Home The patient normally is (ambulatory / bedbound): Ambulatory   FAMILY HISTORY:  Family History  Problem Relation Age of Onset  . Cancer Sister      REVIEW OF SYSTEMS:  Constitutional: denies weight loss, fever, chills, or sweats  Eyes: denies any other vision changes, history of eye injury  ENT: denies sore throat, hearing problems  Respiratory: denies shortness of breath, wheezing  Cardiovascular: denies chest pain, palpitations  Gastrointestinal: abdominal pain, N/V, and bowel function as per HPI Genitourinary: denies burning with urination or urinary frequency Musculoskeletal: denies any other joint pains or cramps  Skin: denies any other rashes or skin discolorations  Neurological: denies any other headache, dizziness, weakness  Psychiatric: denies any other depression, anxiety   All other review of systems were negative   VITAL SIGNS:  Temp:  [99.5 F (37.5 C)] 99.5 F (37.5 C) (04/07 1523) Pulse Rate:  [97] 97 (04/07  1519) Resp:  [16] 16 (04/07 1519) BP: (117)/(70) 117/70 (04/07 1519) SpO2:  [99 %] 99 % (04/07 1519) Weight:  [168 lb (76.2 kg)] 168 lb (76.2 kg) (04/07 1520)     Height: 5\' 6"  (167.6 cm) Weight: 168 lb (76.2 kg) BMI (Calculated): 27.13   INTAKE/OUTPUT:  This shift: No intake/output data recorded.  Last 2 shifts: @IOLAST2SHIFTS @   PHYSICAL EXAM:  Constitutional:  -- Normal body habitus  -- Awake, alert, and oriented x3, no apparent distress Eyes:  -- Pupils equally round and reactive to light  -- No scleral icterus, B/L no occular discharge Ear,  nose, throat: -- Neck is FROM WNL -- No jugular venous distension  Pulmonary:  -- No wheezes or rhales -- Equal breath sounds bilaterally -- Breathing non-labored at rest Cardiovascular:  -- S1, S2 present  -- No pericardial rubs  Gastrointestinal:  -- Abdomen non-tender, but mildly distended, no guarding or rebound tenderness -- Multiple well-healed post-surgical abdominal wounds are present, including those consistent with open appendectomy, umbilical hernia repair, and linear vertical midline wound of uncertain etiology, but likely secondary to the abdominal abscess that patient's wife describes as a "7 lbs jelly ball... removed from his abdomen" -- No abdominal masses appreciated, pulsatile or otherwise  Musculoskeletal and Integumentary:  -- Wounds or skin discoloration: None appreciated -- Extremities: \B/L UE and LE FROM, hands and feet warm, no edema  Neurologic:  -- Motor function: Intact and symmetric -- Sensation: Intact and symmetric Psychiatric:  -- Mood and affect WNL  Labs:  CBC Latest Ref Rng & Units 12/20/2017  WBC 3.8 - 10.6 K/uL 11.2(H)  Hemoglobin 13.0 - 18.0 g/dL 13.0  Hematocrit 40.0 - 52.0 % 39.5(L)  Platelets 150 - 440 K/uL 231   CMP Latest Ref Rng & Units 12/20/2017  Glucose 65 - 99 mg/dL 208(H)  BUN 6 - 20 mg/dL 16  Creatinine 0.61 - 1.24 mg/dL 1.28(H)  Sodium 135 - 145 mmol/L 134(L)  Potassium 3.5 -  5.1 mmol/L 4.8  Chloride 101 - 111 mmol/L 101  CO2 22 - 32 mmol/L 24  Calcium 8.9 - 10.3 mg/dL 9.2  Total Protein 6.5 - 8.1 g/dL 7.8  Total Bilirubin 0.3 - 1.2 mg/dL 0.9  Alkaline Phos 38 - 126 U/L 91  AST 15 - 41 U/L 22  ALT 17 - 63 U/L 16(L)   Imaging studies:  CT Abdomen and Pelvis without Contrast (12/20/2017) - personally reviewed with patient and his wife bedside and discussed with Dr. Burlene Arnt Multiple loops of dilated small proximal small bowel, with a gradual transition to normal caliber in an area where there is some mild bowel wall thickening but no obvious cause for obstruction/transition. Very subtle edema in the mesentery along the involved loops. There are some postoperative findings in the right colon but the intercalary anastomosis does not appear to be a site of obstruction. The appearance could be due to enteritis causing local ileus.  Assessment/Plan: (ICD-10's: K59.0) 77 y.o. male with severe constipation +/- less likely partial SBO or ileus considering prior open abdominal surgery, complicated by AKI and by pertinent comorbidities including DM, HTN, HLD, CAD s/p CABG, asthma, OSA, and chronic constipation.   - pain control prn, minimize narcotics  - recommend both bowel prep (possibly GoLytely) + enemas  - ED physician states plan to consult GI for recommendations  - medical management of comorbidities (home medications)  - monitor abdominal exam and bowel function  - no surgical intervention indicated at this time  - DVT prophylaxis, ambulation encouraged  All of the above findings and recommendations were discussed with the patient, his wife, and ED physician, and all of patient's and his wife's questions were answered to his expressed satisfaction.  Thank you for the opportunity to participate in this patient's care.   -- Marilynne Drivers Rosana Hoes, MD, [place]: [facility] - Partnering for exceptional care. Office: [phone]

## 2017-12-20 NOTE — ED Provider Notes (Addendum)
[facility] Emergency Department Provider Note  ____________________________________________   I have reviewed the triage vital signs and the nursing notes. Where available I have reviewed prior notes and, if possible and indicated, outside hospital notes.    HISTORY  Chief Complaint Abdominal Pain    HPI Daniel Hodges is a 77 y.o. male with a history of CAD as well as remote, 1990 area, surgery on his abdomen for a "abscess".  Details of which are not available to medical history at this time, presents today complaining of gradual onset abdominal pain this afternoon.  Did not vomit.  Felt nauseated.  No change in his bowel movements.  Has not had any diarrhea.  No fever.  Did feel very unwell did not want to have his lunch.  Felt okay this morning.  Pain is a squeezing supraumbilical umbilical but also diffuse discomfort.  Nothing makes it better nothing makes it worse.  No other alleviating or aggravating factors.  Patient does have a history of very hard stools and constipation     Past Medical History:  Diagnosis Date  . Anginal pain ([place])   . Asthma   . Coronary artery disease   . Diabetes mellitus without complication ([place])   . Hyperlipidemia   . Hypertension   . Sleep apnea     There are no active problems to display for this patient.   Past Surgical History:  Procedure Laterality Date  . APPENDECTOMY    . COLONOSCOPY WITH PROPOFOL N/A 06/11/2015   Procedure: COLONOSCOPY WITH PROPOFOL;  Surgeon: Manya Silvas, MD;  Location: [facility] ENDOSCOPY;  Service: Endoscopy;  Laterality: N/A;  . CORONARY ARTERY BYPASS GRAFT    . HERNIA REPAIR    . TEE WITHOUT CARDIOVERSION    . TRACHEOSTOMY    . VASCULAR SURGERY      Prior to Admission medications   Medication Sig Start Date End Date Taking? Authorizing Provider  aspirin 81 MG tablet Take 81 mg by mouth daily.   Yes [provider]  atorvastatin (LIPITOR) 20 MG tablet TAKE 1 TABLET BY  MOUTH AT BEDTIME 11/18/17  Yes Boscia, Heather E, NP  Dulaglutide (TRULICITY) 1.5 SA/6.3KZ SOPN Inject 1.5 mg into the skin every 7 (seven) days.    Yes [provider]  ferrous sulfate 325 (65 FE) MG tablet Take 325 mg by mouth 2 (two) times daily with a meal.   Yes [provider]  furosemide (LASIX) 40 MG tablet Take 40 mg by mouth.   Yes [provider]  gabapentin (NEURONTIN) 300 MG capsule Take 300 mg by mouth at bedtime.   Yes [provider]  glipiZIDE (GLUCOTROL) 10 MG tablet Take 10 mg by mouth daily before breakfast.   Yes [provider]  ipratropium-albuterol (DUONEB) 0.5-2.5 (3) MG/3ML SOLN Take 3 mLs by nebulization every 6 (six) hours as needed. 08/28/17  Yes Ronnell Freshwater, NP  metFORMIN (GLUCOPHAGE) 1000 MG tablet Take 1 tablet (1,000 mg total) by mouth 2 (two) times daily with a meal. 08/28/17  Yes Boscia, Heather E, NP  montelukast (SINGULAIR) 10 MG tablet Take 10 mg by mouth at bedtime.   Yes [provider]  Multiple Vitamin (MULTIVITAMIN WITH MINERALS) TABS tablet Take 1 tablet by mouth daily.   Yes [provider]  senna (SENOKOT) 8.6 MG tablet Take 1 tablet by mouth daily.   Yes [provider]  azithromycin (ZITHROMAX) 250 MG tablet z-pack - take as directed for 5 days Patient  not taking: Reported on 12/20/2017 12/04/17   Ronnell Freshwater, NP    Allergies Penicillins and Neostigmine  Family History  Problem Relation Age of Onset  . Cancer Sister     Social History Social History   Tobacco Use  . Smoking status: Never Smoker  . Smokeless tobacco: Never Used  Substance Use Topics  . Alcohol use: No  . Drug use: No    Review of Systems Constitutional: No fever/chills Eyes: No visual changes. ENT: No sore throat. No stiff neck no neck pain Cardiovascular: Denies chest pain. Respiratory: Denies shortness of breath. Gastrointestinal:   no vomiting.  No diarrhea.   =  constipation. Genitourinary: Negative for dysuria. Musculoskeletal: Negative lower extremity swelling Skin: Negative for rash. Neurological: Negative for severe headaches, focal weakness or numbness.   ____________________________________________   PHYSICAL EXAM:  VITAL SIGNS: ED Triage Vitals  Enc Vitals Group     BP 12/20/17 1519 117/70     Pulse Rate 12/20/17 1519 97     Resp 12/20/17 1519 16     Temp 12/20/17 1523 99.5 F (37.5 C)     Temp Source 12/20/17 1523 Oral     SpO2 12/20/17 1519 99 %     Weight 12/20/17 1520 168 lb (76.2 kg)     Height 12/20/17 1520 5\' 6"  (1.676 m)     Head Circumference --      [place] Flow --      Pain Score 12/20/17 1520 9     Pain Loc --      Pain Edu? --      Excl. in [place]? --     Constitutional: Alert and oriented. Well appearing and in no acute distress. Eyes: Conjunctivae are normal Head: Atraumatic HEENT: No congestion/rhinnorhea. Mucous membranes are moist.  Oropharynx non-erythematous Neck:   Nontender with no meningismus, no masses, no stridor Cardiovascular: Normal rate, regular rhythm. Grossly normal heart sounds.  Good peripheral circulation. Respiratory: Normal respiratory effort.  No retractions. Lungs CTAB. Abdominal: Mildly diffusely tender especially in the suprapubic region, no obvious herniation palpated.  Voluntary guarding, soft abdomen, no rebound, diminished bowel sounds Back:  There is no focal tenderness or step off.  there is no midline tenderness there are no lesions noted. there is no CVA tenderness Musculoskeletal: No lower extremity tenderness, no upper extremity tenderness. No joint effusions, no DVT signs strong distal pulses no edema Neurologic:  Normal speech and language. No gross focal neurologic deficits are appreciated.  Skin:  Skin is warm, dry and intact. No rash noted. Psychiatric: Mood and affect are normal. Speech and behavior are normal.  ____________________________________________   LABS (all  labs ordered are listed, but only abnormal results are displayed)  Labs Reviewed  GLUCOSE, CAPILLARY - Abnormal; Notable for the following components:      Result Value   Glucose-Capillary 205 (*)    All other components within normal limits  COMPREHENSIVE METABOLIC PANEL - Abnormal; Notable for the following components:   Sodium 134 (*)    Glucose, Bld 208 (*)    Creatinine, Ser 1.28 (*)    ALT 16 (*)    GFR calc non Af Amer 53 (*)    All other components within normal limits  CBC - Abnormal; Notable for the following components:   WBC 11.2 (*)    HCT 39.5 (*)    RDW 14.7 (*)    All other components within normal limits  LIPASE, BLOOD  TROPONIN I  LACTIC ACID, PLASMA  PROTIME-INR  URINALYSIS, COMPLETE (UACMP) WITH MICROSCOPIC  LACTIC ACID, PLASMA  TYPE AND SCREEN    Pertinent labs  results that were available during my care of the patient were reviewed by me and considered in my medical decision making (see chart for details). ____________________________________________  EKG  I personally interpreted any EKGs ordered by me or triage Sinus rhythm, rate 90 bpm no acute ST elevation or depression no specific ST changes, LAD noted. ____________________________________________  RADIOLOGY  Pertinent labs & imaging results that were available during my care of the patient were reviewed by me and considered in my medical decision making (see chart for details). If possible, patient and/or family made aware of any abnormal findings.  Dg Chest Port 1 View  Result Date: 12/20/2017 CLINICAL DATA:  Testing diffuse abdominal pain onset today. EXAM: PORTABLE CHEST 1 VIEW COMPARISON:  08/26/2016 FINDINGS: Prior median sternotomy and CABG. Low lung volumes. Mild cardiomegaly. No edema. Mild atelectasis or scarring at both lung bases. IMPRESSION: 1. No acute thoracic findings. 2. Mild atelectasis or scarring at the lung bases. 3. Cardiomegaly.  Prior CABG. Electronically Signed   By: Van Clines M.D.   On: 12/20/2017 15:59   Ct Renal Stone Study  Result Date: 12/20/2017 CLINICAL DATA:  Abdominal pain onset at 1 p.m. today. Pallor and diaphoresis. EXAM: CT ABDOMEN AND PELVIS WITHOUT CONTRAST TECHNIQUE: Multidetector CT imaging of the abdomen and pelvis was performed following the standard protocol without IV contrast. COMPARISON:  Overlapping portions of CT chest from 01/07/2008 FINDINGS: Lower chest: Scarring in the right middle lobe and lingula. Coronary atherosclerotic calcification. Small type 1 hiatal hernia. Hepatobiliary: Unremarkable Pancreas: Scattered punctate calcifications in the pancreatic parenchyma compatible with chronic calcific pancreatitis. Spleen: Unremarkable Adrenals/Urinary Tract: Unremarkable Stomach/Bowel: Postoperative findings in the right colon. Multiple dilated loops of proximal small bowel are present some containing air-fluid levels at differing vertical levels. These lead to mid abdominal loops of small bowel which are mildly dilated and mildly thick-walled. There is a gradual transition to nondilated ileum. A specific cause for obstruction is not identified. No abscess or extraluminal gas. No portal venous gas. Vascular/Lymphatic: Aortoiliac atherosclerotic vascular disease. No appreciable adenopathy. Reproductive: The prostate gland measures 6.2 by 4.2 by 5.3 cm (volume = 72 cm^3). Other: No supplemental non-categorized findings. Musculoskeletal: Median sternotomy. Indirect left inguinal hernia containing adipose tissues. No appreciable herniated bowel. IMPRESSION: 1. Multiple loops of dilated small proximal small bowel, with a gradual transition to normal caliber in an area where there is some mild bowel wall thickening but no obvious cause for obstruction/transition. Very subtle edema in the mesentery along the involved loops. There are some postoperative findings in the right colon but the intercalary anastomosis does not appear to be a site of obstruction.  The appearance could be due to enteritis causing local ileus. 2. Other imaging findings of potential clinical significance: Aortoiliac atherosclerotic vascular disease. Coronary atherosclerosis. Small type 1 hiatal hernia. Chronic calcific pancreatitis. Prostatomegaly. Indirect left inguinal hernia contains adipose tissues (no herniated bowel). Electronically Signed   By: Van Clines M.D.   On: 12/20/2017 15:58   ____________________________________________    PROCEDURES  Procedure(s) performed: None  Procedures  Critical Care performed: None  ____________________________________________   INITIAL IMPRESSION / ASSESSMENT AND PLAN / ED COURSE  Pertinent labs & imaging results that were available during my care of the patient were reviewed by me and considered in my medical decision making (see chart for details).  She with abdominal pain, currently mostly today  but has had it off and on for some time it appears in any event, he had a cramping discomfort the seem to grab him earlier today that was worse than he normally gets.  I was concerned about him given his age and history of abdominal surgeries in the remote past and I did a stat CT scan to ensure that the patient does not have a AAA etc.  Is very reassuring, the only thing really seen is significant constipation which can cause the symptoms.  Blood work and vital signs are reassuring.  I did talk to general surgery however given his presentation Dr. Rosana Hoes was kind enough to come and evaluate the patient he feels there is no surgical pathology and the patient likely can go home with a bowel prep, he sees no clear indication for other admission i and he feels that surgery certainly is not warranted there is no surgical pathology seen on exam or CT patient is much more comfortable at this time.  We will discuss with GI given the extent of his symptoms what they would recommend   ----------------------------------------- 6:44 PM on  12/20/2017 -----------------------------------------  Patient is feeling better in terms of his abdomen it remains nonsurgical, I did talk to Dr. Alice Reichert of GI. Mar[facility] Kitchen  We will try an enema.  He wants the patient to be admitted I have explained to him that it is possible that insurance would not pay for admission for abdominal pain from constipation at least without [place] attempting something in the emergency department.  I did attempt at disimpaction but I do not feel any stool in the rectal vault.  Of course, the read in the radiology film is dilated loops of bowel which could be an early SBO which is of concern to me in terms of sending the patient home with an attempted oral medications as I think that would be counter indicated in an early ileus or SBO.  Given family concerned about this, we will give an enema here family is adamant that they do not think he is safe to go home and he certainly did appear ill-appearing when he arrived, I will talk to the hospitalist about an observational stay for abdominal pain/possible early SBO we will continue to observe him here    ____________________________________________   FINAL CLINICAL IMPRESSION(S) / ED DIAGNOSES  Final diagnoses:  Abdominal pain      This chart was dictated using voice recognition software.  Despite best efforts to proofread,  errors can occur which can change meaning.      Schuyler Amor, MD 12/20/17 1806    Schuyler Amor, MD 12/20/17 1845    Schuyler Amor, MD 12/20/17 [phone]

## 2017-12-21 ENCOUNTER — Inpatient Hospital Stay: Payer: Medicare Other

## 2017-12-21 ENCOUNTER — Telehealth: Payer: Self-pay

## 2017-12-21 LAB — URINALYSIS, COMPLETE (UACMP) WITH MICROSCOPIC
BACTERIA UA: NONE SEEN
Bilirubin Urine: NEGATIVE
Glucose, UA: 50 mg/dL — AB
Hgb urine dipstick: NEGATIVE
Ketones, ur: 5 mg/dL — AB
Leukocytes, UA: NEGATIVE
Nitrite: NEGATIVE
PROTEIN: NEGATIVE mg/dL
Specific Gravity, Urine: 1.02 (ref 1.005–1.030)
Squamous Epithelial / LPF: NONE SEEN
pH: 5 (ref 5.0–8.0)

## 2017-12-21 LAB — GLUCOSE, CAPILLARY
GLUCOSE-CAPILLARY: 256 mg/dL — AB (ref 65–99)
Glucose-Capillary: 176 mg/dL — ABNORMAL HIGH (ref 65–99)
Glucose-Capillary: 201 mg/dL — ABNORMAL HIGH (ref 65–99)
Glucose-Capillary: 195 mg/dL — ABNORMAL HIGH (ref 65–99)

## 2017-12-21 LAB — BASIC METABOLIC PANEL
Anion gap: 9 (ref 5–15)
BUN: 20 mg/dL (ref 6–20)
CHLORIDE: 98 mmol/L — AB (ref 101–111)
CO2: 26 mmol/L (ref 22–32)
CREATININE: 1.07 mg/dL (ref 0.61–1.24)
Calcium: 8.7 mg/dL — ABNORMAL LOW (ref 8.9–10.3)
GFR calc Af Amer: >60 mL/min (ref >60)
GFR calc non Af Amer: >60 mL/min (ref >60)
GLUCOSE: 208 mg/dL — AB (ref 65–99)
Potassium: 4.6 mmol/L (ref 3.5–5.1)
SODIUM: 133 mmol/L — AB (ref 135–145)

## 2017-12-21 LAB — HEMOGLOBIN A1C
HEMOGLOBIN A1C: 8.3 % — AB (ref 4.8–5.6)
Mean Plasma Glucose: 191.51 mg/dL

## 2017-12-21 MED ORDER — POLYETHYLENE GLYCOL 3350 17 G PO PACK
17.0000 g | PACK | Freq: Every day | ORAL | Status: DC | PRN
Start: 2017-12-21 — End: 2017-12-22
  Administered 2017-12-21: 18:00:00 17 g via ORAL
  Filled 2017-12-21: qty 1

## 2017-12-21 MED ORDER — SENNA 8.6 MG PO TABS
1.0000 | ORAL_TABLET | Freq: Two times a day (BID) | ORAL | Status: DC
  Administered 2017-12-21 – 2017-12-22 (×3): 8.6 mg via ORAL
  Filled 2017-12-21 (×3): qty 1

## 2017-12-21 MED ORDER — ENOXAPARIN SODIUM 40 MG/0.4ML ~~LOC~~ SOLN
40.0000 mg | SUBCUTANEOUS | Status: DC
  Administered 2017-12-21: 21:00:00 40 mg via SUBCUTANEOUS
  Filled 2017-12-21: qty 0.4

## 2017-12-21 MED ORDER — DOCUSATE SODIUM 100 MG PO CAPS
100.0000 mg | ORAL_CAPSULE | Freq: Two times a day (BID) | ORAL | Status: DC | PRN
  Administered 2017-12-21: 100 mg via ORAL
  Filled 2017-12-21 (×2): qty 1

## 2017-12-21 NOTE — Progress Notes (Signed)
[facility] [place] NAME: Daniel Hodges    MR#:  [id number]  DATE OF BIRTH:  Feb 07, 1941  SUBJECTIVE:   Patient here due to abdominal pain and noted to be in acute kidney injury.  Patient had a large bowel movement after soap suds enema this morning.  Patient given some mag citrate.  Patient is still complaining of some vague upper abdominal pain but no nausea or vomiting.  he is also complaining of a cough but is nonproductive.  REVIEW OF SYSTEMS:    Review of Systems  Constitutional: Negative for chills and fever.  HENT: Negative for congestion and tinnitus.   Eyes: Negative for blurred vision and double vision.  Respiratory: Positive for cough. Negative for shortness of breath and wheezing.   Cardiovascular: Negative for chest pain, orthopnea and PND.  Gastrointestinal: Positive for abdominal pain. Negative for diarrhea, nausea and vomiting.  Genitourinary: Negative for dysuria and hematuria.  Neurological: Negative for dizziness, sensory change and focal weakness.  All other systems reviewed and are negative.   Nutrition: heart healthy Tolerating Diet: Yes Tolerating PT: Eval noted.   DRUG ALLERGIES:   Allergies  Allergen Reactions  . Penicillins Anaphylaxis  . Neostigmine     PEA arrest    VITALS:  Blood pressure 137/69, pulse 90, temperature 99.5 F (37.5 C), temperature source Oral, resp. rate 20, height 5\' 6"  (1.676 m), weight 70.3 kg (155 lb), SpO2 100 %.  PHYSICAL EXAMINATION:   Physical Exam  GENERAL:  77 y.o.-year-old patient lying in bed in no acute distress.  EYES: Pupils equal, round, reactive to light and accommodation. No scleral icterus. Extraocular muscles intact.  HEENT: Head atraumatic, normocephalic. Oropharynx and nasopharynx clear.  NECK:  Supple, no jugular venous distention. No thyroid enlargement, no tenderness.  LUNGS: Normal breath sounds bilaterally, no wheezing, rales, rhonchi. No use of  accessory muscles of respiration.  CARDIOVASCULAR: S1, S2 normal. No murmurs, rubs, or gallops.  ABDOMEN: Soft, tender in upper abdomen but no rebound, rigidity, nondistended. Bowel sounds present. No organomegaly or mass.  EXTREMITIES: No cyanosis, clubbing or edema b/l.    NEUROLOGIC: Cranial nerves II through XII are intact. No focal Motor or sensory deficits b/l.   PSYCHIATRIC: The patient is alert and oriented x 3.  SKIN: No obvious rash, lesion, or ulcer.    LABORATORY PANEL:   CBC Recent Labs  Lab 12/20/17 1521  WBC 11.2*  HGB 13.0  HCT 39.5*  PLT 231   ------------------------------------------------------------------------------------------------------------------  Chemistries  Recent Labs  Lab 12/20/17 1521 12/21/17 0436  NA 134* 133*  K 4.8 4.6  CL 101 98*  CO2 24 26  GLUCOSE 208* 208*  BUN 16 20  CREATININE 1.28* 1.07  CALCIUM 9.2 8.7*  AST 22  --   ALT 16*  --   ALKPHOS 91  --   BILITOT 0.9  --    ------------------------------------------------------------------------------------------------------------------  Cardiac Enzymes Recent Labs  Lab 12/20/17 1521  TROPONINI <0.03   ------------------------------------------------------------------------------------------------------------------  RADIOLOGY:  Dg Chest Port 1 View  Result Date: 12/20/2017 CLINICAL DATA:  Testing diffuse abdominal pain onset today. EXAM: PORTABLE CHEST 1 VIEW COMPARISON:  08/26/2016 FINDINGS: Prior median sternotomy and CABG. Low lung volumes. Mild cardiomegaly. No edema. Mild atelectasis or scarring at both lung bases. IMPRESSION: 1. No acute thoracic findings. 2. Mild atelectasis or scarring at the lung bases. 3. Cardiomegaly.  Prior CABG. Electronically Signed   By: Van Clines M.D.   On: 12/20/2017 15:59  Ct Renal Stone Study  Result Date: 12/20/2017 CLINICAL DATA:  Abdominal pain onset at 1 p.m. today. Pallor and diaphoresis. EXAM: CT ABDOMEN AND PELVIS WITHOUT  CONTRAST TECHNIQUE: Multidetector CT imaging of the abdomen and pelvis was performed following the standard protocol without IV contrast. COMPARISON:  Overlapping portions of CT chest from 01/07/2008 FINDINGS: Lower chest: Scarring in the right middle lobe and lingula. Coronary atherosclerotic calcification. Small type 1 hiatal hernia. Hepatobiliary: Unremarkable Pancreas: Scattered punctate calcifications in the pancreatic parenchyma compatible with chronic calcific pancreatitis. Spleen: Unremarkable Adrenals/Urinary Tract: Unremarkable Stomach/Bowel: Postoperative findings in the right colon. Multiple dilated loops of proximal small bowel are present some containing air-fluid levels at differing vertical levels. These lead to mid abdominal loops of small bowel which are mildly dilated and mildly thick-walled. There is a gradual transition to nondilated ileum. A specific cause for obstruction is not identified. No abscess or extraluminal gas. No portal venous gas. Vascular/Lymphatic: Aortoiliac atherosclerotic vascular disease. No appreciable adenopathy. Reproductive: The prostate gland measures 6.2 by 4.2 by 5.3 cm (volume = 72 cm^3). Other: No supplemental non-categorized findings. Musculoskeletal: Median sternotomy. Indirect left inguinal hernia containing adipose tissues. No appreciable herniated bowel. IMPRESSION: 1. Multiple loops of dilated small proximal small bowel, with a gradual transition to normal caliber in an area where there is some mild bowel wall thickening but no obvious cause for obstruction/transition. Very subtle edema in the mesentery along the involved loops. There are some postoperative findings in the right colon but the intercalary anastomosis does not appear to be a site of obstruction. The appearance could be due to enteritis causing local ileus. 2. Other imaging findings of potential clinical significance: Aortoiliac atherosclerotic vascular disease. Coronary atherosclerosis. Small  type 1 hiatal hernia. Chronic calcific pancreatitis. Prostatomegaly. Indirect left inguinal hernia contains adipose tissues (no herniated bowel). Electronically Signed   By: Van Clines M.D.   On: 12/20/2017 15:58     ASSESSMENT AND PLAN:   77 year old male with past medical history of retention, hyperlipidemia, diabetes, history of coronary artery disease, obstructive sleep apnea who presented to the hospital due to abdominal pain and noted to be in acute kidney injury.  1.  Acute kidney injury-secondary to dehydration.  This has improved and resolved with IV fluid hydration.  Patient is taking p.o. now and will DC IV fluids.  Follow BUN and creatinine.  2.  Abdominal pain-etiology unclear but suspected to be secondary to enteritis/ileus/constipation. - Patient is still complaining of vague upper abdominal pain but his abdominal exam is benign.  Patient had a large bowel movement after getting some soapsuds enema this morning.  CT abdomen pelvis yesterday showing some enteritis/ileus.  I would repeat a KUB today. -Continue supportive care with laxatives for now.  3.  Diabetes type 2 without complication- hold glipizide, metformin, Trulicity.  Continue sliding scale insulin for now.  4.  Hyperlipidemia-continue atorvastatin.  5.  Neuropathy-continue gabapentin.    All the records are reviewed and case discussed with Care Management/Social Worker. Management plans discussed with the patient, family and they are in agreement.  CODE STATUS: Full code  DVT Prophylaxis: Lovenox  TOTAL TIME TAKING CARE OF THIS PATIENT: 30 minutes.   POSSIBLE D/C IN 1-2 DAYS, DEPENDING ON CLINICAL CONDITION.   Henreitta Leber M.D on 12/21/2017 at 1:37 PM  Between 7am to 6pm - Pager - [phone]  After 6pm go to [email or web address] - Proofreader  [facility]  Office  [phone]  CC: Primary care physician; Clayborn Bigness  Jerilynn Mages, MD

## 2017-12-21 NOTE — Progress Notes (Addendum)
Spoke with dr Verdell Carmine regarding results of xray.  Will watch pt  For now. Pt threw up small amt  tan color liquid vomitus. Pt was given mag citrate earlier today and had medium bm  Medium size flecks of stool  Vanita Ingles Owens Shark color.

## 2017-12-21 NOTE — Progress Notes (Signed)
Soap suds given this morning with good bowel movement.

## 2017-12-21 NOTE — Evaluation (Signed)
Physical Therapy Evaluation Patient Details Name: Daniel Hodges MRN: [id number] DOB: May 07, 1941 Today's Date: 12/21/2017   History of Present Illness  presented to ER secondary to abdominal pain, dizziness; admitted with AKI, abdominal pain secondary to severe constipation.  Clinical Impression  Upon evaluation, patient alert and oriented; follows all commands and demonstrates good insight/safety awareness with mobility tasks.  Bilat UE/LE strength and ROM grossly symmetrical and WFL; no focal weakness appreciated.  Completes position changes without difficulty; no subjective/objective symptoms of orthostasis (vitals stable throughout).  Able to complete bed mobility with mod indep; sit/stand, basic transfers, gait (240') and stairs (up/down 6 with single rail) without assist device, close sup/mod indep.  Completes dynamic gait components without difficulty.  Good awareness of limits of stability and overall safety needs. Appears to be at baseline level of functional ability without need for acute PT.  Patient in agreement.  Will complete order at this time; please re-consult should needs change.    Follow Up Recommendations No PT follow up    Equipment Recommendations       Recommendations for Other Services       Precautions / Restrictions Precautions Precautions: Fall Restrictions Weight Bearing Restrictions: No      Mobility  Bed Mobility Overal bed mobility: Modified Independent                Transfers Overall transfer level: Modified independent Equipment used: None                Ambulation/Gait Ambulation/Gait assistance: Modified independent (Device/Increase time);Supervision Ambulation Distance (Feet): 240 Feet Assistive device: None   Gait velocity: 10' walk time, 6 seconds   General Gait Details: reciprocal stepping with fair step height/length; fair cadence and gait speed. Completes dynamic gait components without difficulty.  Stairs Stairs:  Yes Stairs assistance: Supervision Stair Management: One rail Left Number of Stairs: 6 General stair comments: step to gait pattern leading with L LE (patient self-selecting); goo dstability and safety awareness  Wheelchair Mobility    Modified Rankin (Stroke Patients Only)       Balance Overall balance assessment: Needs assistance Sitting-balance support: Feet supported;No upper extremity supported Sitting balance-Leahy Scale: Normal     Standing balance support: No upper extremity supported Standing balance-Leahy Scale: Good Standing balance comment: functional reach >6"; good awareness of limits of stability, good ability to return to upright                              Pertinent Vitals/Pain Pain Assessment: Faces Faces Pain Scale: Hurts little more Pain Location: abdomen Pain Descriptors / Indicators: Sore Pain Intervention(s): Limited activity within patient's tolerance;Monitored during session;Repositioned    Home Living Family/patient expects to be discharged to:: Private residence Living Arrangements: Spouse/significant other Available Help at Discharge: Family;Available 24 hours/day Type of Home: House Home Access: Stairs to enter Entrance Stairs-Rails: Right;Left;Can reach both Entrance Stairs-Number of Steps: 3 Home Layout: One level Home Equipment: None      Prior Function Level of Independence: Independent         Comments: Indep with ADLs, household and community mobilization without assist device; 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     [place]: Left    Extremity/Trunk Assessment   Upper Extremity Assessment Upper Extremity Assessment: Overall WFL for tasks assessed    Lower Extremity Assessment Lower Extremity Assessment: Overall WFL for tasks assessed  Communication   Communication: No difficulties  Cognition Arousal/Alertness: Awake/alert Behavior During Therapy: WFL for tasks  assessed/performed Overall Cognitive Status: Within Functional Limits for tasks assessed                                        General Comments      Exercises     Assessment/Plan    PT Assessment Patent does not need any further PT services  PT Problem List         PT Treatment Interventions      PT Goals (Current goals can be found in the Care Plan section)  Acute Rehab PT Goals Patient Stated Goal: to feel better PT Goal Formulation: All assessment and education complete, DC therapy Time For Goal Achievement: 12/21/17 Potential to Achieve Goals: Good    Frequency     Barriers to discharge        Co-evaluation               AM-PAC PT "6 Clicks" Daily Activity  Outcome Measure Difficulty turning over in bed (including adjusting bedclothes, sheets and blankets)?: None Difficulty moving from lying on back to sitting on the side of the bed? : None Difficulty sitting down on and standing up from a chair with arms (e.g., wheelchair, bedside commode, etc,.)?: None Help needed moving to and from a bed to chair (including a wheelchair)?: None Help needed walking in hospital room?: None Help needed climbing 3-5 steps with a railing? : None 6 Click Score: 24    End of Session Equipment Utilized During Treatment: Gait belt Activity Tolerance: Patient tolerated treatment well Patient left: in chair;with chair alarm set;with call bell/phone within reach Nurse Communication: Mobility status PT Visit Diagnosis: Difficulty in walking, not elsewhere classified (R26.2)    Time: 6803-2122 PT Time Calculation (min) (ACUTE ONLY): 21 min   Charges:   PT Evaluation $PT Eval Low Complexity: 1 Low PT Treatments $Therapeutic Activity: 8-22 mins   PT G Codes:        Kerianna Rawlinson H. Owens Shark, PT, DPT, NCS 12/21/17, 1:15 PM [phone]

## 2017-12-21 NOTE — Telephone Encounter (Signed)
Pt wife called to just FYI that pt is admitted in [facility] yesterday because he is abdominal pain and heather aware

## 2017-12-21 NOTE — Progress Notes (Signed)
Lovenox changed to 40 mg daily for CrCl >30 and BMI <40. 

## 2017-12-22 ENCOUNTER — Inpatient Hospital Stay: Payer: Medicare Other

## 2017-12-22 ENCOUNTER — Other Ambulatory Visit: Payer: Self-pay | Admitting: Internal Medicine

## 2017-12-22 LAB — GLUCOSE, CAPILLARY
Glucose-Capillary: 151 mg/dL — ABNORMAL HIGH (ref 65–99)
Glucose-Capillary: 250 mg/dL — ABNORMAL HIGH (ref 65–99)

## 2017-12-22 MED ORDER — POLYETHYLENE GLYCOL 3350 17 G PO PACK: 17.0000 g | PACK | Freq: Every day | ORAL | 0 refills | Status: DC | PRN

## 2017-12-22 MED ORDER — DOCUSATE SODIUM 100 MG PO CAPS: 100.0000 mg | ORAL_CAPSULE | Freq: Two times a day (BID) | ORAL | 0 refills | Status: DC | PRN

## 2017-12-22 MED ORDER — POLYETHYLENE GLYCOL 3350 17 G PO PACK: 17.0000 g | PACK | Freq: Every day | ORAL | 1 refills | Status: DC

## 2017-12-22 NOTE — Discharge Instructions (Signed)

## 2017-12-22 NOTE — Plan of Care (Signed)
  Problem: Education: Goal: Knowledge of General Education information will improve Outcome: Progressing   Problem: Clinical Measurements: Goal: Ability to maintain clinical measurements within normal limits will improve Outcome: Progressing Goal: Will remain free from infection Outcome: Progressing Goal: Diagnostic test results will improve Outcome: Progressing   Problem: Elimination: Goal: Will not experience complications related to bowel motility Outcome: Progressing Goal: Will not experience complications related to urinary retention Outcome: Progressing   Problem: Pain Managment: Goal: General experience of comfort will improve Outcome: Progressing   Problem: Safety: Goal: Ability to remain free from injury will improve Outcome: Progressing   Problem: Skin Integrity: Goal: Risk for impaired skin integrity will decrease Outcome: Progressing

## 2017-12-22 NOTE — Progress Notes (Signed)
Pt for discharge home. No distress.sl x 2 d/cd.  Denies pain/ bm again small greenish  Color  Long flecks of stool. Instructions discussed with pt and pts wife. presc given and they and home meds discussed.  Diet / activity and f/u discussed.  Home via w/c at this time.

## 2017-12-22 NOTE — Discharge Summary (Signed)
[place] [facility] [place] NAME: Daniel Hodges    MR#:  [id number]  DATE OF BIRTH:  [birth year]  DATE OF ADMISSION:  12/20/2017 ADMITTING PHYSICIAN: Nicholes Mango, MD  DATE OF DISCHARGE: 12/22/2017  PRIMARY CARE PHYSICIAN: Lavera Guise, MD    ADMISSION DIAGNOSIS:  Abdominal pain [R10.9]  DISCHARGE DIAGNOSIS:  Active Problems:   AKI (acute kidney injury) ([place])   SECONDARY DIAGNOSIS:   Past Medical History:  Diagnosis Date  . Anginal pain ([place])   . Asthma   . Coronary artery disease   . Diabetes mellitus without complication ([place])   . Hyperlipidemia   . Hypertension   . Sleep apnea     HOSPITAL COURSE:   77 year old male with past medical history of retention, hyperlipidemia, diabetes, history of coronary artery disease, obstructive sleep apnea who presented to the hospital due to abdominal pain and noted to be in acute kidney injury.  1.  Acute kidney injury- secondary to dehydration.  Patient was given some IV fluids, creatinine has improved and is back to baseline.  2.  Abdominal pain-etiology unclear but suspected to be secondary to enteritis/ileus/constipation. -Patient was seen by general surgery and he did not think the patient need acute surgical intervention.  Patient was treated supportively with laxatives.  Patient was given mag citrate and also a soapsuds enema.  Patient has had multiple bowel movements since being admitted to the hospital.  Patient had a abdominal x-ray today which is showing improving/resolving ileus and enteritis.  Patient has had no nausea vomiting and is tolerating p.o. well.  Patient is therefore now being discharged home on laxatives.  Patient will continue his Senokot, MiraLAX and Colace were also added to his regimen.  3.  Diabetes type 2 without complication- while in the hospital pt. Was on SSI and Metformin, Glipizide, Trulicity.   4.  Hyperlipidemia- pt. Will continue atorvastatin.  5.   Neuropathy- pt. Will continue gabapentin.   DISCHARGE CONDITIONS:   Stable.   CONSULTS OBTAINED:    DRUG ALLERGIES:   Allergies  Allergen Reactions  . Penicillins Anaphylaxis  . Neostigmine     PEA arrest    DISCHARGE MEDICATIONS:   Allergies as of 12/22/2017      Reactions   Penicillins Anaphylaxis   Neostigmine    PEA arrest      Medication List    STOP taking these medications   azithromycin 250 MG tablet Commonly known as:  ZITHROMAX     TAKE these medications   aspirin 81 MG tablet Take 81 mg by mouth daily.   atorvastatin 20 MG tablet Commonly known as:  LIPITOR TAKE 1 TABLET BY MOUTH AT BEDTIME   docusate sodium 100 MG capsule Commonly known as:  COLACE Take 1 capsule (100 mg total) by mouth 2 (two) times daily as needed for mild constipation.   ferrous sulfate 325 (65 FE) MG tablet Take 325 mg by mouth 2 (two) times daily with a meal.   furosemide 40 MG tablet Commonly known as:  LASIX Take 40 mg by mouth.   gabapentin 300 MG capsule Commonly known as:  NEURONTIN Take 300 mg by mouth at bedtime.   glipiZIDE 10 MG tablet Commonly known as:  GLUCOTROL Take 10 mg by mouth daily before breakfast.   ipratropium-albuterol 0.5-2.5 (3) MG/3ML Soln Commonly known as:  DUONEB Take 3 mLs by nebulization every 6 (six) hours as needed.   metFORMIN 1000 MG tablet Commonly known as:  GLUCOPHAGE  Take 1 tablet (1,000 mg total) by mouth 2 (two) times daily with a meal.   montelukast 10 MG tablet Commonly known as:  SINGULAIR Take 10 mg by mouth at bedtime.   multivitamin with minerals Tabs tablet Take 1 tablet by mouth daily.   polyethylene glycol packet Commonly known as:  MIRALAX / GLYCOLAX Take 17 g by mouth daily.   senna 8.6 MG tablet Commonly known as:  SENOKOT Take 1 tablet by mouth daily.   TRULICITY 1.5 OZ/3.6UY Sopn Generic drug:  Dulaglutide Inject 1.5 mg into the skin every 7 (seven) days.         DISCHARGE INSTRUCTIONS:    DIET:  Cardiac diet and Diabetic diet  DISCHARGE CONDITION:  Stable  ACTIVITY:  Activity as tolerated  OXYGEN:  Home Oxygen: No.   Oxygen Delivery: room air  DISCHARGE LOCATION:  home   If you experience worsening of your admission symptoms, develop shortness of breath, life threatening emergency, suicidal or homicidal thoughts you must seek medical attention immediately by calling 911 or calling your MD immediately  if symptoms less severe.  You Must read complete instructions/literature along with all the possible adverse reactions/side effects for all the Medicines you take and that have been prescribed to you. Take any new Medicines after you have completely understood and accpet all the possible adverse reactions/side effects.   Please note  You were cared for by a hospitalist during your hospital stay. If you have any questions about your discharge medications or the care you received while you were in the hospital after you are discharged, you can call the unit and asked to speak with the hospitalist on call if the hospitalist that took care of you is not available. Once you are discharged, your primary care physician will handle any further medical issues. Please note that NO REFILLS for any discharge medications will be authorized once you are discharged, as it is imperative that you return to your primary care physician (or establish a relationship with a primary care physician if you do not have one) for your aftercare needs so that they can reassess your need for medications and monitor your lab values.     Today   Patient's abdominal pain has improved.  He had 2-3 soft bowel movements this morning.  No nausea or vomiting.  Patient is not febrile, still complaining of a cough but is nonproductive and patient is not hypoxic.  Patient will be discharged home later today.  VITAL SIGNS:  Blood pressure (!) 151/73, pulse 87, temperature 98.1 F (36.7 C), temperature  source Oral, resp. rate 16, height 5\' 6"  (1.676 m), weight 70.3 kg (155 lb), SpO2 98 %.  I/O:    Intake/Output Summary (Last 24 hours) at 12/22/2017 1303 Last data filed at 12/22/2017 0900 Gross per 24 hour  Intake 240 ml  Output -  Net 240 ml    PHYSICAL EXAMINATION:   GENERAL:  77 y.o.-year-old patient lying in bed in no acute distress.  EYES: Pupils equal, round, reactive to light and accommodation. No scleral icterus. Extraocular muscles intact.  HEENT: Head atraumatic, normocephalic. Oropharynx and nasopharynx clear.  NECK:  Supple, no jugular venous distention. No thyroid enlargement, no tenderness.  LUNGS: Normal breath sounds bilaterally, no wheezing, rales, rhonchi. No use of accessory muscles of respiration.  CARDIOVASCULAR: S1, S2 normal. No murmurs, rubs, or gallops.  ABDOMEN: Soft, NT,  nondistended. Bowel sounds present. No organomegaly or mass.  EXTREMITIES: No cyanosis, clubbing or edema b/l.  NEUROLOGIC: Cranial nerves II through XII are intact. No focal Motor or sensory deficits b/l.   PSYCHIATRIC: The patient is alert and oriented x 3.  SKIN: No obvious rash, lesion, or ulcer.   DATA REVIEW:   CBC Recent Labs  Lab 12/20/17 1521  WBC 11.2*  HGB 13.0  HCT 39.5*  PLT 231    Chemistries  Recent Labs  Lab 12/20/17 1521 12/21/17 0436  NA 134* 133*  K 4.8 4.6  CL 101 98*  CO2 24 26  GLUCOSE 208* 208*  BUN 16 20  CREATININE 1.28* 1.07  CALCIUM 9.2 8.7*  AST 22  --   ALT 16*  --   ALKPHOS 91  --   BILITOT 0.9  --     Cardiac Enzymes Recent Labs  Lab 12/20/17 [place] <0.03    Microbiology Results  No results found for this or any previous visit.  RADIOLOGY:  Dg Abd 1 View  Result Date: 12/22/2017 CLINICAL DATA:  Abdominal pain EXAM: ABDOMEN - 1 VIEW COMPARISON:  December 21, 2017 FINDINGS: There is overall slightly less bowel dilatation compared to 1 day prior. There is air in small and large bowel. No free air. No abnormal  calcifications. IMPRESSION: Appearance suggests partial resolution of enteritis or ileus. No evident bowel obstruction. No free air. Electronically Signed   By: Lowella Grip III M.D.   On: 12/22/2017 07:41   Dg Abd 1 View  Result Date: 12/21/2017 CLINICAL DATA:  Lower abdominal pain with constipation EXAM: ABDOMEN - 1 VIEW COMPARISON:  CT abdomen and pelvis December 20, 2017 FINDINGS: There remain loops of mildly dilated bowel without air-fluid levels. No free air. There is moderate stool in the colon. IMPRESSION: Bowel gas pattern suggests enteritis or ileus. A degree of small bowel obstruction cannot be entirely excluded. No free air. Appearance appears similar to CT 1 day prior. Electronically Signed   By: Lowella Grip III M.D.   On: 12/21/2017 14:06   Dg Chest Port 1 View  Result Date: 12/20/2017 CLINICAL DATA:  Testing diffuse abdominal pain onset today. EXAM: PORTABLE CHEST 1 VIEW COMPARISON:  08/26/2016 FINDINGS: Prior median sternotomy and CABG. Low lung volumes. Mild cardiomegaly. No edema. Mild atelectasis or scarring at both lung bases. IMPRESSION: 1. No acute thoracic findings. 2. Mild atelectasis or scarring at the lung bases. 3. Cardiomegaly.  Prior CABG. Electronically Signed   By: Van Clines M.D.   On: 12/20/2017 15:59   Ct Renal Stone Study  Result Date: 12/20/2017 CLINICAL DATA:  Abdominal pain onset at 1 p.m. today. Pallor and diaphoresis. EXAM: CT ABDOMEN AND PELVIS WITHOUT CONTRAST TECHNIQUE: Multidetector CT imaging of the abdomen and pelvis was performed following the standard protocol without IV contrast. COMPARISON:  Overlapping portions of CT chest from 01/07/2008 FINDINGS: Lower chest: Scarring in the right middle lobe and lingula. Coronary atherosclerotic calcification. Small type 1 hiatal hernia. Hepatobiliary: Unremarkable Pancreas: Scattered punctate calcifications in the pancreatic parenchyma compatible with chronic calcific pancreatitis. Spleen: Unremarkable  Adrenals/Urinary Tract: Unremarkable Stomach/Bowel: Postoperative findings in the right colon. Multiple dilated loops of proximal small bowel are present some containing air-fluid levels at differing vertical levels. These lead to mid abdominal loops of small bowel which are mildly dilated and mildly thick-walled. There is a gradual transition to nondilated ileum. A specific cause for obstruction is not identified. No abscess or extraluminal gas. No portal venous gas. Vascular/Lymphatic: Aortoiliac atherosclerotic vascular disease. No appreciable adenopathy. Reproductive: The prostate gland measures 6.2 by 4.2  by 5.3 cm (volume = 72 cm^3). Other: No supplemental non-categorized findings. Musculoskeletal: Median sternotomy. Indirect left inguinal hernia containing adipose tissues. No appreciable herniated bowel. IMPRESSION: 1. Multiple loops of dilated small proximal small bowel, with a gradual transition to normal caliber in an area where there is some mild bowel wall thickening but no obvious cause for obstruction/transition. Very subtle edema in the mesentery along the involved loops. There are some postoperative findings in the right colon but the intercalary anastomosis does not appear to be a site of obstruction. The appearance could be due to enteritis causing local ileus. 2. Other imaging findings of potential clinical significance: Aortoiliac atherosclerotic vascular disease. Coronary atherosclerosis. Small type 1 hiatal hernia. Chronic calcific pancreatitis. Prostatomegaly. Indirect left inguinal hernia contains adipose tissues (no herniated bowel). Electronically Signed   By: Van Clines M.D.   On: 12/20/2017 15:58      Management plans discussed with the patient, family and they are in agreement.  CODE STATUS:     Code Status Orders  (From admission, onward)        Start     Ordered   12/20/17 2116  Full code  Continuous     12/20/17 2115      TOTAL TIME TAKING CARE OF THIS  PATIENT: 40 minutes.    Henreitta Leber M.D on 12/22/2017 at 1:03 PM  Between 7am to 6pm - Pager - [phone]  After 6pm go to [email or web address] - password [email or web address]  Office  [phone]  CC: Primary care physician; Lavera Guise, MD

## 2017-12-27 DIAGNOSIS — Z23 Encounter for immunization: Secondary | ICD-10-CM | POA: Insufficient documentation

## 2017-12-27 DIAGNOSIS — I2510 Atherosclerotic heart disease of native coronary artery without angina pectoris: Secondary | ICD-10-CM | POA: Insufficient documentation

## 2017-12-27 DIAGNOSIS — I2583 Coronary atherosclerosis due to lipid rich plaque: Secondary | ICD-10-CM | POA: Insufficient documentation

## 2017-12-27 DIAGNOSIS — J069 Acute upper respiratory infection, unspecified: Secondary | ICD-10-CM | POA: Insufficient documentation

## 2017-12-27 DIAGNOSIS — E1169 Type 2 diabetes mellitus with other specified complication: Secondary | ICD-10-CM | POA: Insufficient documentation

## 2017-12-27 DIAGNOSIS — E1165 Type 2 diabetes mellitus with hyperglycemia: Secondary | ICD-10-CM | POA: Insufficient documentation

## 2017-12-27 DIAGNOSIS — E782 Mixed hyperlipidemia: Secondary | ICD-10-CM | POA: Insufficient documentation

## 2017-12-29 ENCOUNTER — Encounter: Payer: Self-pay | Admitting: Internal Medicine

## 2017-12-29 ENCOUNTER — Ambulatory Visit (INDEPENDENT_AMBULATORY_CARE_PROVIDER_SITE_OTHER): Payer: Medicare Other | Admitting: Internal Medicine

## 2017-12-29 ENCOUNTER — Ambulatory Visit: Payer: Medicare Other | Admitting: Nurse Practitioner

## 2017-12-29 ENCOUNTER — Ambulatory Visit (INDEPENDENT_AMBULATORY_CARE_PROVIDER_SITE_OTHER): Payer: Medicare Other | Admitting: Nurse Practitioner

## 2017-12-29 VITALS — BP 136/74 | HR 78 | Resp 16 | Ht 66.0 in | Wt 166.0 lb

## 2017-12-29 VITALS — BP 132/71 | HR 75 | Resp 16 | Ht 66.0 in | Wt 166.0 lb

## 2017-12-29 DIAGNOSIS — K5901 Slow transit constipation: Secondary | ICD-10-CM

## 2017-12-29 DIAGNOSIS — E1165 Type 2 diabetes mellitus with hyperglycemia: Secondary | ICD-10-CM | POA: Diagnosis not present

## 2017-12-29 DIAGNOSIS — J301 Allergic rhinitis due to pollen: Secondary | ICD-10-CM

## 2017-12-29 DIAGNOSIS — I10 Essential (primary) hypertension: Secondary | ICD-10-CM | POA: Insufficient documentation

## 2017-12-29 DIAGNOSIS — I2510 Atherosclerotic heart disease of native coronary artery without angina pectoris: Secondary | ICD-10-CM | POA: Diagnosis not present

## 2017-12-29 DIAGNOSIS — E1159 Type 2 diabetes mellitus with other circulatory complications: Secondary | ICD-10-CM | POA: Insufficient documentation

## 2017-12-29 DIAGNOSIS — E860 Dehydration: Secondary | ICD-10-CM | POA: Diagnosis not present

## 2017-12-29 LAB — POCT GLYCOSYLATED HEMOGLOBIN (HGB A1C): HEMOGLOBIN A1C: 8.5

## 2017-12-29 MED ORDER — POLYETHYLENE GLYCOL 3350 17 G PO PACK: 17.0000 g | PACK | Freq: Every day | ORAL | 3 refills | Status: DC

## 2017-12-29 MED ORDER — MONTELUKAST SODIUM 10 MG PO TABS: 10.0000 mg | ORAL_TABLET | Freq: Every day | ORAL | 5 refills | Status: DC

## 2017-12-29 MED ORDER — DOCUSATE SODIUM 100 MG PO CAPS: 100.0000 mg | ORAL_CAPSULE | Freq: Two times a day (BID) | ORAL | 3 refills | Status: AC | PRN

## 2017-12-29 MED ORDER — LORATADINE 10 MG PO TABS: 10.0000 mg | ORAL_TABLET | Freq: Every day | ORAL | 5 refills | Status: DC

## 2017-12-29 NOTE — Progress Notes (Signed)
[facility] [place], [place] [address]  Internal MEDICINE  Office Visit Note  Patient Name: Daniel Hodges  [id number]  [id number]  Date of Service: 12/30/2017     Chief Complaint  Patient presents with  . Hospitalization Follow-up    constipation, abdominal pain, dehydration.      The patient is here as a hospital follow up. He was admitted for two days. Had severe constipation. Question of partial small bowel obstruction. He was dehydrated and had acute renal injury due to the dehydration. He received IV fluids and soap suds enema. Follow up abdominal x-ray indicated at least partial resolution of the constipation and improved renal functions.  He is now taking miralax and dulcolax every day to regulate his bowels. He states that he is now having regular bowel movements. Yesterday, had 3 bowel movements and has already had one this morning.    Pt is here for recent hospital follow up.  Current Medication: Outpatient Encounter Medications as of 12/29/2017  Medication Sig  . aspirin 81 MG tablet Take 81 mg by mouth daily.  Marland Kitchen atorvastatin (LIPITOR) 20 MG tablet TAKE 1 TABLET BY MOUTH AT BEDTIME  . docusate sodium (COLACE) 100 MG capsule Take 1 capsule (100 mg total) by mouth 2 (two) times daily as needed for mild constipation.  . Dulaglutide (TRULICITY) 1.5 UX/3.2GM SOPN Inject 1.5 mg into the skin every 7 (seven) days.   . ferrous sulfate 325 (65 FE) MG tablet Take 325 mg by mouth 2 (two) times daily with a meal.  . furosemide (LASIX) 40 MG tablet Take 40 mg by mouth.  . gabapentin (NEURONTIN) 300 MG capsule Take 300 mg by mouth at bedtime.  Marland Kitchen glipiZIDE (GLUCOTROL) 10 MG tablet Take 10 mg by mouth daily before breakfast.  . ipratropium-albuterol (DUONEB) 0.5-2.5 (3) MG/3ML SOLN Take 3 mLs by nebulization every 6 (six) hours as needed.  . loratadine (CLARITIN) 10 MG tablet Take 1 tablet (10 mg total) by mouth daily.  . metFORMIN (GLUCOPHAGE) 1000 MG  tablet Take 1 tablet (1,000 mg total) by mouth 2 (two) times daily with a meal.  . montelukast (SINGULAIR) 10 MG tablet Take 1 tablet (10 mg total) by mouth daily.  . Multiple Vitamin (MULTIVITAMIN WITH MINERALS) TABS tablet Take 1 tablet by mouth daily.  . polyethylene glycol (MIRALAX / GLYCOLAX) packet Take 17 g by mouth daily.  Marland Kitchen senna (SENOKOT) 8.6 MG tablet Take 1 tablet by mouth daily.  . [DISCONTINUED] docusate sodium (COLACE) 100 MG capsule Take 1 capsule (100 mg total) by mouth 2 (two) times daily as needed for mild constipation.  . [DISCONTINUED] montelukast (SINGULAIR) 10 MG tablet TAKE 1 TABLET BY MOUTH EVERY DAY  . [DISCONTINUED] polyethylene glycol (MIRALAX / GLYCOLAX) packet Take 17 g by mouth daily.   No facility-administered encounter medications on file as of 12/29/2017.     Surgical History: Past Surgical History:  Procedure Laterality Date  . APPENDECTOMY    . COLONOSCOPY WITH PROPOFOL N/A 06/11/2015   Procedure: COLONOSCOPY WITH PROPOFOL;  Surgeon: Manya Silvas, MD;  Location: [facility] ENDOSCOPY;  Service: Endoscopy;  Laterality: N/A;  . CORONARY ARTERY BYPASS GRAFT    . HERNIA REPAIR    . TEE WITHOUT CARDIOVERSION    . TRACHEOSTOMY    . VASCULAR SURGERY      Medical History: Past Medical History:  Diagnosis Date  . Anginal pain ([place])   . Asthma   . Coronary artery disease   . Diabetes  mellitus without complication ([place])   . Hyperlipidemia   . Hypertension   . Sleep apnea     Family History: Family History  Problem Relation Age of Onset  . Cancer Sister     Social History   Socioeconomic History  . Marital status: Married    Spouse name: Not on file  . Number of children: Not on file  . Years of education: Not on file  . Highest education level: Not on file  Occupational History  . Not on file  Social Needs  . Financial resource strain: Not on file  . Food insecurity:    Worry: Not on file    Inability: Not on file  . Transportation needs:     Medical: Not on file    Non-medical: Not on file  Tobacco Use  . Smoking status: Never Smoker  . Smokeless tobacco: Never Used  Substance and Sexual Activity  . Alcohol use: No  . Drug use: No  . Sexual activity: Not on file  Lifestyle  . Physical activity:    Days per week: Not on file    Minutes per session: Not on file  . Stress: Not on file  Relationships  . Social connections:    Talks on phone: Not on file    Gets together: Not on file    Attends religious service: Not on file    Active member of club or organization: Not on file    Attends meetings of clubs or organizations: Not on file    Relationship status: Not on file  . Intimate partner violence:    Fear of current or ex partner: Not on file    Emotionally abused: Not on file    Physically abused: Not on file    Forced sexual activity: Not on file  Other Topics Concern  . Not on file  Social History Narrative  . Not on file      Review of Systems  Constitutional: Negative for activity change, chills and unexpected weight change.  HENT: Negative for congestion, postnasal drip, rhinorrhea, sinus pressure, sinus pain, sore throat and voice change.   Eyes: Negative.   Respiratory: Positive for cough. Negative for chest tightness, shortness of breath and wheezing.   Cardiovascular: Negative for chest pain, palpitations and leg swelling.  Gastrointestinal: Positive for abdominal pain and constipation. Negative for vomiting.  Endocrine:       Blood sugars running high.   Genitourinary: Negative.   Musculoskeletal: Negative for arthralgias, back pain and myalgias.  Skin: Negative for rash.       Dry skin  Allergic/Immunologic: Negative for environmental allergies.  Neurological: Negative for weakness and headaches.  Hematological: Negative for adenopathy.  Psychiatric/Behavioral: Negative for dysphoric mood. The patient is not nervous/anxious.     Today's Vitals   12/29/17 1113  BP: 132/71  Pulse: 75   Resp: 16  SpO2: 98%  Weight: 166 lb (75.3 kg)  Height: 5\' 6"  (1.676 m)    Physical Exam  Constitutional: He is oriented to person, place, and time. He appears well-developed and well-nourished.  HENT:  Head: Normocephalic and atraumatic.  Nose: Rhinorrhea present. Right sinus exhibits frontal sinus tenderness. Left sinus exhibits frontal sinus tenderness.  Eyes: Pupils are equal, round, and reactive to light. EOM are normal.  Neck: Normal range of motion. Neck supple. No JVD present. No thyromegaly present.  Cardiovascular: Normal rate and regular rhythm.  Murmur heard. Pulmonary/Chest: Effort normal and breath sounds normal. He has no wheezes.  Congested, non-productive cough present.   Abdominal: Soft. Bowel sounds are normal. He exhibits no mass. There is no tenderness. There is no guarding.  Musculoskeletal: Normal range of motion.  Lymphadenopathy:    He has no cervical adenopathy.  Neurological: He is alert and oriented to person, place, and time.  Skin: Skin is warm and dry.  Psychiatric: He has a normal mood and affect. His behavior is normal. Judgment and thought content normal.  Nursing note and vitals reviewed.  Assessment/Plan: 1. Slow transit constipation Reviewed hospitalization records with patient. Doing better .contineu new bowel regimen with miralax every evening with docusate sodium 100mg  twice dailiy as needed. Increase water intake.  - polyethylene glycol (MIRALAX / GLYCOLAX) packet; Take 17 g by mouth daily.  Dispense: 30 each; Refill: 3 - docusate sodium (COLACE) 100 MG capsule; Take 1 capsule (100 mg total) by mouth 2 (two) times daily as needed for mild constipation.  Dispense: 60 capsule; Refill: 3  2. Non-seasonal allergic rhinitis due to pollen - montelukast (SINGULAIR) 10 MG tablet; Take 1 tablet (10 mg total) by mouth daily.  Dispense: 30 tablet; Refill: 5 - loratadine (CLARITIN) 10 MG tablet; Take 1 tablet (10 mg total) by mouth daily.  Dispense: 30  tablet; Refill: 5   3. Essential hypertension Stable. Continue bp medication as prescribed   4. Coronary artery disease involving native heart without angina pectoris, unspecified vessel or lesion type Continue regular visits with cardiology as scheduled   5. Uncontrolled type 2 diabetes mellitus with hyperglycemia (HCC) No medication changes made today. Emphasized dietary changes.  6. Dehydration -  Check BMP and CBC to ensure functions returned to normal.   General Counseling: Oliver verbalizes understanding of the findings of todays visit and agrees with plan of treatment. I have discussed any further diagnostic evaluation that may be needed or ordered today. We also reviewed his medications today. he has been encouraged to call the office with any questions or concerns that should arise related to todays visit.        I have reviewed all medical records from hospital follow up including radiology reports and consults from other physicians. Appropriate follow up diagnostics will be scheduled as needed. Patient/ Family understands the plan of treatment. Time spent 20 minutes.   Dr Lavera Guise, MD Internal Medicine

## 2017-12-30 ENCOUNTER — Encounter: Payer: Self-pay | Admitting: Nurse Practitioner

## 2017-12-30 DIAGNOSIS — E860 Dehydration: Secondary | ICD-10-CM | POA: Insufficient documentation

## 2017-12-30 DIAGNOSIS — J301 Allergic rhinitis due to pollen: Secondary | ICD-10-CM | POA: Insufficient documentation

## 2017-12-30 DIAGNOSIS — K5900 Constipation, unspecified: Secondary | ICD-10-CM | POA: Insufficient documentation

## 2018-01-06 DIAGNOSIS — H35372 Puckering of macula, left eye: Secondary | ICD-10-CM | POA: Diagnosis not present

## 2018-01-06 DIAGNOSIS — E119 Type 2 diabetes mellitus without complications: Secondary | ICD-10-CM | POA: Diagnosis not present

## 2018-01-14 ENCOUNTER — Encounter: Payer: Self-pay | Admitting: Nurse Practitioner

## 2018-01-14 ENCOUNTER — Ambulatory Visit: Payer: Medicare Other | Admitting: Nurse Practitioner

## 2018-01-14 VITALS — BP 131/70 | HR 85 | Temp 98.2°F | Resp 16 | Ht 66.0 in | Wt 159.4 lb

## 2018-01-14 DIAGNOSIS — J069 Acute upper respiratory infection, unspecified: Secondary | ICD-10-CM | POA: Diagnosis not present

## 2018-01-14 DIAGNOSIS — R05 Cough: Secondary | ICD-10-CM

## 2018-01-14 DIAGNOSIS — J301 Allergic rhinitis due to pollen: Secondary | ICD-10-CM

## 2018-01-14 DIAGNOSIS — E1165 Type 2 diabetes mellitus with hyperglycemia: Secondary | ICD-10-CM | POA: Diagnosis not present

## 2018-01-14 DIAGNOSIS — R059 Cough, unspecified: Secondary | ICD-10-CM

## 2018-01-14 MED ORDER — LORATADINE 10 MG PO TABS: 10.0000 mg | ORAL_TABLET | Freq: Every day | ORAL | 5 refills | Status: DC

## 2018-01-14 MED ORDER — BENZONATATE 200 MG PO CAPS: 200.0000 mg | ORAL_CAPSULE | Freq: Two times a day (BID) | ORAL | 0 refills | Status: DC | PRN

## 2018-01-14 MED ORDER — SULFAMETHOXAZOLE-TRIMETHOPRIM 800-160 MG PO TABS: 1.0000 | ORAL_TABLET | Freq: Two times a day (BID) | ORAL | 0 refills | Status: DC

## 2018-01-14 NOTE — Progress Notes (Signed)
[facility] [place], [place] [address]  Internal MEDICINE  Office Visit Note  Patient Name: Daniel Hodges  [id number]  [id number]  Date of Service: 01/14/2018  Chief Complaint  Patient presents with  . Cough    started a week ago , fever , yellow phlegm   . Nasal Congestion     Cough  This is a new problem. The current episode started in the past 7 days. The problem has been gradually worsening. The cough is productive of sputum. Associated symptoms include chills, a fever, headaches, myalgias, nasal congestion, postnasal drip and shortness of breath. Pertinent negatives include no chest pain, eye redness, rash, rhinorrhea or sore throat. The symptoms are aggravated by exercise. Risk factors for lung disease include occupational exposure. Treatments tried: tylenol for fever.  The treatment provided mild relief. His past medical history is significant for asthma and environmental allergies.   Pt is here for a sick visit.     Current Medication:  Outpatient Encounter Medications as of 01/14/2018  Medication Sig  . aspirin 81 MG tablet Take 81 mg by mouth daily.  Marland Kitchen atorvastatin (LIPITOR) 20 MG tablet TAKE 1 TABLET BY MOUTH AT BEDTIME  . benzonatate (TESSALON) 200 MG capsule Take 1 capsule (200 mg total) by mouth 2 (two) times daily as needed for cough.  . docusate sodium (COLACE) 100 MG capsule Take 1 capsule (100 mg total) by mouth 2 (two) times daily as needed for mild constipation.  . Dulaglutide (TRULICITY) 1.5 [place]/7.0WC SOPN Inject 1.5 mg into the skin every 7 (seven) days.   . ferrous sulfate 325 (65 FE) MG tablet Take 325 mg by mouth 2 (two) times daily with a meal.  . furosemide (LASIX) 40 MG tablet Take 40 mg by mouth.  . gabapentin (NEURONTIN) 300 MG capsule Take 300 mg by mouth at bedtime.  Marland Kitchen glipiZIDE (GLUCOTROL) 10 MG tablet Take 10 mg by mouth daily before breakfast.  . ipratropium-albuterol (DUONEB) 0.5-2.5 (3) MG/3ML SOLN Take 3 mLs by  nebulization every 6 (six) hours as needed.  . loratadine (CLARITIN) 10 MG tablet Take 1 tablet (10 mg total) by mouth daily.  . metFORMIN (GLUCOPHAGE) 1000 MG tablet Take 1 tablet (1,000 mg total) by mouth 2 (two) times daily with a meal.  . montelukast (SINGULAIR) 10 MG tablet Take 1 tablet (10 mg total) by mouth daily.  . Multiple Vitamin (MULTIVITAMIN WITH MINERALS) TABS tablet Take 1 tablet by mouth daily.  . polyethylene glycol (MIRALAX / GLYCOLAX) packet Take 17 g by mouth daily.  Marland Kitchen senna (SENOKOT) 8.6 MG tablet Take 1 tablet by mouth daily.  Marland Kitchen sulfamethoxazole-trimethoprim (BACTRIM DS,SEPTRA DS) 800-160 MG tablet Take 1 tablet by mouth 2 (two) times daily.  . [DISCONTINUED] loratadine (CLARITIN) 10 MG tablet Take 1 tablet (10 mg total) by mouth daily.   No facility-administered encounter medications on file as of 01/14/2018.       Medical History: Past Medical History:  Diagnosis Date  . Anginal pain ([place])   . Asthma   . Coronary artery disease   . Diabetes mellitus without complication ([place])   . Hyperlipidemia   . Hypertension   . Sleep apnea     Today's Vitals   01/14/18 0847  BP: 131/70  Pulse: 85  Resp: 16  Temp: 98.2 F (36.8 C)  SpO2: 95%  Weight: 159 lb 6.4 oz (72.3 kg)  Height: 5\' 6"  (1.676 m)     Review of Systems  Constitutional: Positive for  chills, fatigue, fever and unexpected weight change.       Weight loss of 5 pounds since his last visit.   HENT: Positive for congestion, nosebleeds, postnasal drip, sinus pressure and sinus pain. Negative for rhinorrhea, sneezing and sore throat.   Eyes: Negative.  Negative for redness.  Respiratory: Positive for cough and shortness of breath. Negative for chest tightness.   Cardiovascular: Negative for chest pain and palpitations.  Gastrointestinal: Positive for constipation. Negative for abdominal pain, diarrhea, nausea and vomiting.  Endocrine:       Improved blood sugars.   Genitourinary: Negative for  dysuria and frequency.  Musculoskeletal: Positive for myalgias. Negative for arthralgias, back pain, joint swelling and neck pain.  Skin: Negative for rash.  Allergic/Immunologic: Positive for environmental allergies.  Neurological: Positive for headaches. Negative for tremors and numbness.  Hematological: Negative for adenopathy. Does not bruise/bleed easily.  Psychiatric/Behavioral: Negative for behavioral problems (Depression), sleep disturbance and suicidal ideas. The patient is not nervous/anxious.     Physical Exam  Constitutional: He is oriented to person, place, and time. He appears well-developed and well-nourished. No distress.  HENT:  Head: Normocephalic and atraumatic.  Mouth/Throat: Oropharynx is clear and moist. No oropharyngeal exudate.  Eyes: Pupils are equal, round, and reactive to light. EOM are normal.  Neck: Normal range of motion. Neck supple. No JVD present. No tracheal deviation present. No thyromegaly present.  Cardiovascular: Normal rate and normal heart sounds. Exam reveals no gallop and no friction rub.  No murmur heard. MILDY IRREGULAR HEART RHYTHM   Pulmonary/Chest: Effort normal. No respiratory distress. He has wheezes. He has no rales. He exhibits no tenderness.  Congested lung fields throughout.   Abdominal: Soft. Bowel sounds are normal. There is no tenderness.  Musculoskeletal: Normal range of motion.  Lymphadenopathy:    He has no cervical adenopathy.  Neurological: He is alert and oriented to person, place, and time. No cranial nerve deficit.  Skin: Skin is warm and dry. He is not diaphoretic.  Psychiatric: He has a normal mood and affect. His behavior is normal. Judgment and thought content normal.  Nursing note and vitals reviewed.  Assessment/Plan: 1. Acute upper respiratory infection - sulfamethoxazole-trimethoprim (BACTRIM DS,SEPTRA DS) 800-160 MG tablet; Take 1 tablet by mouth 2 (two) times daily.  Dispense: 20 tablet; Refill: 0  2. Cough -  benzonatate (TESSALON) 200 MG capsule; Take 1 capsule (200 mg total) by mouth 2 (two) times daily as needed for cough.  Dispense: 20 capsule; Refill: 0  3. Non-seasonal allergic rhinitis due to pollen - loratadine (CLARITIN) 10 MG tablet; Take 1 tablet (10 mg total) by mouth daily.  Dispense: 30 tablet; Refill: 5  4. Uncontrolled type 2 diabetes mellitus with hyperglycemia (HCC) Blood sugars improving. Continue diabetic meds as prescribed.   General Counseling: Donelle verbalizes understanding of the findings of todays visit and agrees with plan of treatment. I have discussed any further diagnostic evaluation that may be needed or ordered today. We also reviewed his medications today. he has been encouraged to call the office with any questions or concerns that should arise related to todays visit.  Rest and increase fluids. Continue using OTC medication to control symptoms.   This patient was seen by Leretha Pol, FNP- C in Collaboration with Dr Lavera Guise as a part of collaborative care agreement  Meds ordered this encounter  Medications  . sulfamethoxazole-trimethoprim (BACTRIM DS,SEPTRA DS) 800-160 MG tablet    Sig: Take 1 tablet by mouth 2 (two) times daily.  Dispense:  20 tablet    Refill:  0    Order Specific Question:   Supervising Provider    Answer:   Lavera Guise [[id number]]  . benzonatate (TESSALON) 200 MG capsule    Sig: Take 1 capsule (200 mg total) by mouth 2 (two) times daily as needed for cough.    Dispense:  20 capsule    Refill:  0    Order Specific Question:   Supervising Provider    Answer:   Lavera Guise [[id number]]  . loratadine (CLARITIN) 10 MG tablet    Sig: Take 1 tablet (10 mg total) by mouth daily.    Dispense:  30 tablet    Refill:  5    Order Specific Question:   Supervising Provider    Answer:   Lavera Guise [[id number]]    Time spent: 15 Minutes

## 2018-01-18 ENCOUNTER — Ambulatory Visit: Payer: Self-pay | Admitting: Internal Medicine

## 2018-01-22 DIAGNOSIS — R0602 Shortness of breath: Secondary | ICD-10-CM | POA: Insufficient documentation

## 2018-01-22 DIAGNOSIS — I10 Essential (primary) hypertension: Secondary | ICD-10-CM | POA: Diagnosis not present

## 2018-01-22 DIAGNOSIS — I25118 Atherosclerotic heart disease of native coronary artery with other forms of angina pectoris: Secondary | ICD-10-CM | POA: Diagnosis not present

## 2018-01-22 DIAGNOSIS — G4733 Obstructive sleep apnea (adult) (pediatric): Secondary | ICD-10-CM | POA: Diagnosis not present

## 2018-01-22 DIAGNOSIS — E782 Mixed hyperlipidemia: Secondary | ICD-10-CM | POA: Diagnosis not present

## 2018-01-22 HISTORY — DX: Shortness of breath: R06.02

## 2018-01-29 DIAGNOSIS — M79675 Pain in left toe(s): Secondary | ICD-10-CM | POA: Diagnosis not present

## 2018-01-29 DIAGNOSIS — M79674 Pain in right toe(s): Secondary | ICD-10-CM | POA: Diagnosis not present

## 2018-01-29 DIAGNOSIS — B351 Tinea unguium: Secondary | ICD-10-CM | POA: Diagnosis not present

## 2018-02-02 ENCOUNTER — Ambulatory Visit: Payer: Medicare Other | Admitting: Internal Medicine

## 2018-02-02 ENCOUNTER — Encounter: Payer: Self-pay | Admitting: Internal Medicine

## 2018-02-02 VITALS — BP 120/80 | HR 98 | Resp 16 | Ht 66.0 in | Wt 160.6 lb

## 2018-02-02 DIAGNOSIS — J4520 Mild intermittent asthma, uncomplicated: Secondary | ICD-10-CM | POA: Diagnosis not present

## 2018-02-02 DIAGNOSIS — Z9989 Dependence on other enabling machines and devices: Secondary | ICD-10-CM | POA: Diagnosis not present

## 2018-02-02 DIAGNOSIS — G4733 Obstructive sleep apnea (adult) (pediatric): Secondary | ICD-10-CM

## 2018-02-02 NOTE — Progress Notes (Signed)
[facility] [place], [place] [address]  Pulmonary Sleep Medicine   Office Visit Note  Patient Name: Daniel Hodges DOB: 02/10/1941 MRN [id number]  Date of Service: 02/02/2018  Complaints/HPI:  Patient is doing relatively well his only issue right now is that there is a was selling sound that his wife has noticed when he is asleep with his CPAP device.  It appears that he has nasal mask and he is a mouth breather so he probably is opening up his mouth at nighttime creating was line sounds.  S spoke to the wife and the patient suggested that they get full face mask which should hopefully resolve the problem.  As far as his asthma is concerned this is under control he has been doing fairly well.  Note admissions to the hospital  ROS  General: (-) fever, (-) chills, (-) night sweats, (-) weakness Skin: (-) rashes, (-) itching,. Eyes: (-) visual changes, (-) redness, (-) itching. Nose and Sinuses: (-) nasal stuffiness or itchiness, (-) postnasal drip, (-) nosebleeds, (-) sinus trouble. Mouth and Throat: (-) sore throat, (-) hoarseness. Neck: (-) swollen glands, (-) enlarged thyroid, (-) neck pain. Respiratory: - cough, (-) bloody sputum, + shortness of breath, - wheezing. Cardiovascular: - ankle swelling, (-) chest pain. Lymphatic: (-) lymph node enlargement. Neurologic: (-) numbness, (-) tingling. Psychiatric: (-) anxiety, (-) depression   Current Medication: Outpatient Encounter Medications as of 02/02/2018  Medication Sig  . aspirin 81 MG tablet Take 81 mg by mouth daily.  Marland Kitchen atorvastatin (LIPITOR) 20 MG tablet TAKE 1 TABLET BY MOUTH AT BEDTIME  . benzonatate (TESSALON) 200 MG capsule Take 1 capsule (200 mg total) by mouth 2 (two) times daily as needed for cough.  . Dulaglutide (TRULICITY) 1.5 XB/1.4NW SOPN Inject 1.5 mg into the skin every 7 (seven) days.   . ferrous sulfate 325 (65 FE) MG tablet Take 325 mg by mouth 2 (two) times daily with a meal.  .  furosemide (LASIX) 40 MG tablet Take 40 mg by mouth.  . gabapentin (NEURONTIN) 300 MG capsule Take 300 mg by mouth at bedtime.  Marland Kitchen glipiZIDE (GLUCOTROL) 10 MG tablet Take 10 mg by mouth daily before breakfast.  . ipratropium-albuterol (DUONEB) 0.5-2.5 (3) MG/3ML SOLN Take 3 mLs by nebulization every 6 (six) hours as needed.  . loratadine (CLARITIN) 10 MG tablet Take 1 tablet (10 mg total) by mouth daily.  . metFORMIN (GLUCOPHAGE) 1000 MG tablet Take 1 tablet (1,000 mg total) by mouth 2 (two) times daily with a meal.  . montelukast (SINGULAIR) 10 MG tablet Take 1 tablet (10 mg total) by mouth daily.  . Multiple Vitamin (MULTIVITAMIN WITH MINERALS) TABS tablet Take 1 tablet by mouth daily.  . polyethylene glycol (MIRALAX / GLYCOLAX) packet Take 17 g by mouth daily.  Marland Kitchen senna (SENOKOT) 8.6 MG tablet Take 1 tablet by mouth daily.  Marland Kitchen sulfamethoxazole-trimethoprim (BACTRIM DS,SEPTRA DS) 800-160 MG tablet Take 1 tablet by mouth 2 (two) times daily.   No facility-administered encounter medications on file as of 02/02/2018.     Surgical History: Past Surgical History:  Procedure Laterality Date  . APPENDECTOMY    . COLONOSCOPY WITH PROPOFOL N/A 06/11/2015   Procedure: COLONOSCOPY WITH PROPOFOL;  Surgeon: Manya Silvas, MD;  Location: [facility] ENDOSCOPY;  Service: Endoscopy;  Laterality: N/A;  . CORONARY ARTERY BYPASS GRAFT    . HERNIA REPAIR    . TEE WITHOUT CARDIOVERSION    . TRACHEOSTOMY    . VASCULAR SURGERY  Medical History: Past Medical History:  Diagnosis Date  . Anginal pain ([place])   . Asthma   . Coronary artery disease   . Diabetes mellitus without complication ([place])   . Hyperlipidemia   . Hypertension   . Sleep apnea     Family History: Family History  Problem Relation Age of Onset  . Cancer Sister     Social History: Social History   Socioeconomic History  . Marital status: Married    Spouse name: Not on file  . Number of children: Not on file  . Years of  education: Not on file  . Highest education level: Not on file  Occupational History  . Not on file  Social Needs  . Financial resource strain: Not on file  . Food insecurity:    Worry: Not on file    Inability: Not on file  . Transportation needs:    Medical: Not on file    Non-medical: Not on file  Tobacco Use  . Smoking status: Never Smoker  . Smokeless tobacco: Never Used  Substance and Sexual Activity  . Alcohol use: No  . Drug use: No  . Sexual activity: Not on file  Lifestyle  . Physical activity:    Days per week: Not on file    Minutes per session: Not on file  . Stress: Not on file  Relationships  . Social connections:    Talks on phone: Not on file    Gets together: Not on file    Attends religious service: Not on file    Active member of club or organization: Not on file    Attends meetings of clubs or organizations: Not on file    Relationship status: Not on file  . Intimate partner violence:    Fear of current or ex partner: Not on file    Emotionally abused: Not on file    Physically abused: Not on file    Forced sexual activity: Not on file  Other Topics Concern  . Not on file  Social History Narrative  . Not on file    Vital Signs: Blood pressure 120/80, pulse 98, resp. rate 16, height 5' 6" (1.676 m), weight 160 lb 9.6 oz (72.8 kg), SpO2 98 %.  Examination: General Appearance: The patient is well-developed, well-nourished, and in no distress. Skin: Gross inspection of skin unremarkable. Head: normocephalic, no gross deformities. Eyes: no gross deformities noted. ENT: ears appear grossly normal no exudates. Neck: Supple. No thyromegaly. No LAD. Respiratory: no rhonchi noted. Cardiovascular: Normal S1 and S2 without murmur or rub. Extremities: No cyanosis. pulses are equal. Neurologic: Alert and oriented. No involuntary movements.  LABS: Recent Results (from the past 2160 hour(s))  Glucose, capillary     Status: Abnormal   Collection Time:  12/20/17  3:17 PM  Result Value Ref Range   Glucose-Capillary 205 (H) 65 - 99 mg/dL  Lipase, blood     Status: None   Collection Time: 12/20/17  3:21 PM  Result Value Ref Range   Lipase 38 11 - 51 U/L    Comment: Performed at [facility], [address] [place], [place] [address]  Comprehensive metabolic panel     Status: Abnormal   Collection Time: 12/20/17  3:21 PM  Result Value Ref Range   Sodium 134 (L) 135 - 145 mmol/L   Potassium 4.8 3.5 - 5.1 mmol/L   Chloride 101 101 - 111 mmol/L   CO2 24 22 - 32 mmol/L   Glucose,  Bld 208 (H) 65 - 99 mg/dL   BUN 16 6 - 20 mg/dL   Creatinine, Ser 1.28 (H) 0.61 - 1.24 mg/dL   Calcium 9.2 8.9 - 10.3 mg/dL   Total Protein 7.8 6.5 - 8.1 g/dL   Albumin 4.4 3.5 - 5.0 g/dL   AST 22 15 - 41 U/L   ALT 16 (L) 17 - 63 U/L   Alkaline Phosphatase 91 38 - 126 U/L   Total Bilirubin 0.9 0.3 - 1.2 mg/dL   GFR calc non Af Amer 53 (L) >60 mL/min   GFR calc Af Amer >60 >60 mL/min    Comment: (NOTE) The eGFR has been calculated using the CKD EPI equation. This calculation has not been validated in all clinical situations. eGFR's persistently <60 mL/min signify possible Chronic Kidney Disease.    Anion gap 9 5 - 15    Comment: Performed at [facility], [address] [place], [place] [address]  CBC     Status: Abnormal   Collection Time: 12/20/17  3:21 PM  Result Value Ref Range   WBC 11.2 (H) 3.8 - 10.6 K/uL   RBC 4.84 4.40 - 5.90 MIL/uL   Hemoglobin 13.0 13.0 - 18.0 g/dL   HCT 39.5 (L) 40.0 - 52.0 %   MCV 81.7 80.0 - 100.0 fL   MCH 27.0 26.0 - 34.0 pg   MCHC 33.0 32.0 - 36.0 g/dL   RDW 14.7 (H) 11.5 - 14.5 %   Platelets 231 150 - 440 K/uL    Comment: Performed at [facility], [address] [place], [place] [address]  Troponin I     Status: None   Collection Time: 12/20/17  3:21 PM  Result Value Ref Range   Troponin I <0.03 <0.03 ng/mL    Comment: Performed at [facility], [address]  [place], [place] [address]  Protime-INR     Status: None   Collection Time: 12/20/17  3:21 PM  Result Value Ref Range   Prothrombin Time 13.3 11.4 - 15.2 seconds   INR 1.02     Comment: Performed at [facility], [address] [place], [place] [address]  Lactic acid, plasma     Status: None   Collection Time: 12/20/17  3:28 PM  Result Value Ref Range   Lactic Acid, Venous 1.9 0.5 - 1.9 mmol/L    Comment: Performed at [facility], [address] [place], [place] [address]  Type and screen Secretary     Status: None   Collection Time: 12/20/17  3:28 PM  Result Value Ref Range   ABO/RH(D) A POS    Antibody Screen NEG    Sample Expiration      12/23/2017 Performed at [facility], [address] [place], [place] [address]   Glucose, capillary     Status: Abnormal   Collection Time: 12/20/17 11:00 PM  Result Value Ref Range   Glucose-Capillary 249 (H) 65 - 99 mg/dL  Hemoglobin A1c     Status: Abnormal   Collection Time: 12/21/17  4:36 AM  Result Value Ref Range   Hgb A1c MFr Bld 8.3 (H) 4.8 - 5.6 %    Comment: (NOTE) Pre diabetes:          5.7%-6.4% Diabetes:              >6.4% Glycemic control for   <7.0% adults with diabetes    Mean Plasma Glucose 191.51 mg/dL  Comment: Performed at [facility], [place] [address], [place], [place] [address]  Basic metabolic panel     Status: Abnormal   Collection Time: 12/21/17  4:36 AM  Result Value Ref Range   Sodium 133 (L) 135 - 145 mmol/L   Potassium 4.6 3.5 - 5.1 mmol/L   Chloride 98 (L) 101 - 111 mmol/L   CO2 26 22 - 32 mmol/L   Glucose, Bld 208 (H) 65 - 99 mg/dL   BUN 20 6 - 20 mg/dL   Creatinine, Ser 1.07 0.61 - 1.24 mg/dL   Calcium 8.7 (L) 8.9 - 10.3 mg/dL   GFR calc non Af Amer >60 >60 mL/min   GFR calc Af Amer >60 >60 mL/min    Comment: (NOTE) The eGFR has been calculated using the CKD EPI equation. This calculation has not been validated in all clinical  situations. eGFR's persistently <60 mL/min signify possible Chronic Kidney Disease.    Anion gap 9 5 - 15    Comment: Performed at [facility], [address] [place], [place] [address]  Urinalysis, Complete w Microscopic     Status: Abnormal   Collection Time: 12/21/17  5:39 AM  Result Value Ref Range   Color, Urine YELLOW (A) YELLOW   APPearance HAZY (A) CLEAR   Specific Gravity, Urine 1.020 1.005 - 1.030   pH 5.0 5.0 - 8.0   Glucose, UA 50 (A) NEGATIVE mg/dL   Hgb urine dipstick NEGATIVE NEGATIVE   Bilirubin Urine NEGATIVE NEGATIVE   Ketones, ur 5 (A) NEGATIVE mg/dL   Protein, ur NEGATIVE NEGATIVE mg/dL   Nitrite NEGATIVE NEGATIVE   Leukocytes, UA NEGATIVE NEGATIVE   RBC / HPF 0-5 0 - 5 RBC/hpf   WBC, UA 0-5 0 - 5 WBC/hpf   Bacteria, UA NONE SEEN NONE SEEN   Squamous Epithelial / LPF NONE SEEN NONE SEEN   Mucus PRESENT     Comment: Performed at [facility], [address] [place], [place] [address]  Glucose, capillary     Status: Abnormal   Collection Time: 12/21/17  7:37 AM  Result Value Ref Range   Glucose-Capillary 176 (H) 65 - 99 mg/dL  Glucose, capillary     Status: Abnormal   Collection Time: 12/21/17 11:27 AM  Result Value Ref Range   Glucose-Capillary 201 (H) 65 - 99 mg/dL  Glucose, capillary     Status: Abnormal   Collection Time: 12/21/17  4:19 PM  Result Value Ref Range   Glucose-Capillary 195 (H) 65 - 99 mg/dL  Glucose, capillary     Status: Abnormal   Collection Time: 12/21/17  9:13 PM  Result Value Ref Range   Glucose-Capillary 256 (H) 65 - 99 mg/dL  Glucose, capillary     Status: Abnormal   Collection Time: 12/22/17  7:50 AM  Result Value Ref Range   Glucose-Capillary 151 (H) 65 - 99 mg/dL  Glucose, capillary     Status: Abnormal   Collection Time: 12/22/17 11:47 AM  Result Value Ref Range   Glucose-Capillary 250 (H) 65 - 99 mg/dL  POCT HgB A1C     Status: None   Collection Time: 12/29/17 10:32 AM  Result Value Ref Range    Hemoglobin A1C 8.5     Radiology: Dg Abd 1 View  Result Date: 12/21/2017 CLINICAL DATA:  Lower abdominal pain with constipation EXAM: ABDOMEN - 1 VIEW COMPARISON:  CT abdomen and pelvis December 20, 2017 FINDINGS: There remain loops of mildly dilated bowel without  air-fluid levels. No free air. There is moderate stool in the colon. IMPRESSION: Bowel gas pattern suggests enteritis or ileus. A degree of small bowel obstruction cannot be entirely excluded. No free air. Appearance appears similar to CT 1 day prior. Electronically Signed   By: Lowella Grip III M.D.   On: 12/21/2017 14:06   Dg Chest Port 1 View  Result Date: 12/20/2017 CLINICAL DATA:  Testing diffuse abdominal pain onset today. EXAM: PORTABLE CHEST 1 VIEW COMPARISON:  08/26/2016 FINDINGS: Prior median sternotomy and CABG. Low lung volumes. Mild cardiomegaly. No edema. Mild atelectasis or scarring at both lung bases. IMPRESSION: 1. No acute thoracic findings. 2. Mild atelectasis or scarring at the lung bases. 3. Cardiomegaly.  Prior CABG. Electronically Signed   By: Van Clines M.D.   On: 12/20/2017 15:59   Ct Renal Stone Study  Result Date: 12/20/2017 CLINICAL DATA:  Abdominal pain onset at 1 p.m. today. Pallor and diaphoresis. EXAM: CT ABDOMEN AND PELVIS WITHOUT CONTRAST TECHNIQUE: Multidetector CT imaging of the abdomen and pelvis was performed following the standard protocol without IV contrast. COMPARISON:  Overlapping portions of CT chest from 01/07/2008 FINDINGS: Lower chest: Scarring in the right middle lobe and lingula. Coronary atherosclerotic calcification. Small type 1 hiatal hernia. Hepatobiliary: Unremarkable Pancreas: Scattered punctate calcifications in the pancreatic parenchyma compatible with chronic calcific pancreatitis. Spleen: Unremarkable Adrenals/Urinary Tract: Unremarkable Stomach/Bowel: Postoperative findings in the right colon. Multiple dilated loops of proximal small bowel are present some containing air-fluid  levels at differing vertical levels. These lead to mid abdominal loops of small bowel which are mildly dilated and mildly thick-walled. There is a gradual transition to nondilated ileum. A specific cause for obstruction is not identified. No abscess or extraluminal gas. No portal venous gas. Vascular/Lymphatic: Aortoiliac atherosclerotic vascular disease. No appreciable adenopathy. Reproductive: The prostate gland measures 6.2 by 4.2 by 5.3 cm (volume = 72 cm^3). Other: No supplemental non-categorized findings. Musculoskeletal: Median sternotomy. Indirect left inguinal hernia containing adipose tissues. No appreciable herniated bowel. IMPRESSION: 1. Multiple loops of dilated small proximal small bowel, with a gradual transition to normal caliber in an area where there is some mild bowel wall thickening but no obvious cause for obstruction/transition. Very subtle edema in the mesentery along the involved loops. There are some postoperative findings in the right colon but the intercalary anastomosis does not appear to be a site of obstruction. The appearance could be due to enteritis causing local ileus. 2. Other imaging findings of potential clinical significance: Aortoiliac atherosclerotic vascular disease. Coronary atherosclerosis. Small type 1 hiatal hernia. Chronic calcific pancreatitis. Prostatomegaly. Indirect left inguinal hernia contains adipose tissues (no herniated bowel). Electronically Signed   By: Van Clines M.D.   On: 12/20/2017 15:58    No results found.  No results found.    Assessment and Plan: Patient Active Problem List   Diagnosis Date Noted  . Cough 01/14/2018  . Constipation 12/30/2017  . Non-seasonal allergic rhinitis due to pollen 12/30/2017  . Dehydration 12/30/2017  . Hypertension 12/29/2017  . Uncontrolled type 2 diabetes mellitus with hyperglycemia ([place]) 12/27/2017  . Acute upper respiratory infection 12/27/2017  . Need for vaccination against Streptococcus  pneumoniae using pneumococcal conjugate vaccine 13 12/27/2017  . Coronary artery disease involving native heart without angina pectoris 12/27/2017  . Mixed hyperlipidemia 12/27/2017  . AKI (acute kidney injury) ([place]) 12/20/2017    1. OSA continue with CPAP on the current pressure settings.  The last down low looked excellent with 99% compliance with the CPAP  device.  He had a very good AHI without any difficulties.  He does need to work on mask and should be switched over to a full face mask. 2. Asthma  Stable at this time samples were given 4 Symbicort  General Counseling: I have discussed the findings of the evaluation and examination with Rajeev.  I have also discussed any further diagnostic evaluation thatmay be needed or ordered today. Chevelle verbalizes understanding of the findings of todays visit. We also reviewed his medications today and discussed drug interactions and side effects including but not limited excessive drowsiness and altered mental states. We also discussed that there is always a risk not just to him but also people around him. he has been encouraged to call the office with any questions or concerns that should arise related to todays visit.    Time spent: 110mn  I have personally obtained a history, examined the patient, evaluated laboratory and imaging results, formulated the assessment and plan and placed orders.    SAllyne Gee MD FResurgens East Surgery Center LLCPulmonary and Critical Care Sleep medicine

## 2018-02-02 NOTE — Patient Instructions (Signed)

## 2018-02-03 ENCOUNTER — Telehealth: Payer: Self-pay | Admitting: Internal Medicine

## 2018-02-03 NOTE — Telephone Encounter (Signed)
Faxed cpap supply order for [facility] for mask. Beth

## 2018-02-18 ENCOUNTER — Other Ambulatory Visit: Payer: Self-pay | Admitting: Internal Medicine

## 2018-02-19 ENCOUNTER — Other Ambulatory Visit: Payer: Self-pay | Admitting: Internal Medicine

## 2018-02-26 ENCOUNTER — Other Ambulatory Visit: Payer: Self-pay

## 2018-02-26 ENCOUNTER — Encounter: Payer: Self-pay | Admitting: Nurse Practitioner

## 2018-02-26 ENCOUNTER — Ambulatory Visit (INDEPENDENT_AMBULATORY_CARE_PROVIDER_SITE_OTHER): Payer: Medicare Other | Admitting: Nurse Practitioner

## 2018-02-26 VITALS — BP 109/52 | HR 83 | Resp 16 | Ht 66.0 in | Wt 163.0 lb

## 2018-02-26 DIAGNOSIS — Z0001 Encounter for general adult medical examination with abnormal findings: Secondary | ICD-10-CM

## 2018-02-26 DIAGNOSIS — I2510 Atherosclerotic heart disease of native coronary artery without angina pectoris: Secondary | ICD-10-CM | POA: Diagnosis not present

## 2018-02-26 DIAGNOSIS — E782 Mixed hyperlipidemia: Secondary | ICD-10-CM

## 2018-02-26 DIAGNOSIS — R300 Dysuria: Secondary | ICD-10-CM

## 2018-02-26 DIAGNOSIS — H6063 Unspecified chronic otitis externa, bilateral: Secondary | ICD-10-CM

## 2018-02-26 DIAGNOSIS — E11649 Type 2 diabetes mellitus with hypoglycemia without coma: Secondary | ICD-10-CM

## 2018-02-26 MED ORDER — ATORVASTATIN CALCIUM 20 MG PO TABS: 20.0000 mg | ORAL_TABLET | Freq: Every day | ORAL | 1 refills | Status: DC

## 2018-02-26 MED ORDER — FREESTYLE LIBRE SENSOR SYSTEM MISC: 11 refills | Status: DC

## 2018-02-26 MED ORDER — NEOMYCIN-POLYMYXIN-HC 3.5-10000-1 OP SUSP
OPHTHALMIC | 2 refills | Status: DC
Start: 2018-02-26 — End: 2019-03-12

## 2018-02-26 NOTE — Progress Notes (Signed)
[facility] [place], [place] [address]  Internal MEDICINE  Office Visit Note  Patient Name: Daniel Hodges  [id number]  [id number]  Date of Service: 03/20/2018   Pt is here for routine health maintenance examination   Chief Complaint  Patient presents with  . Annual Exam  . Hypertension  . Diabetes     Hypertension  This is a chronic problem. The current episode started more than 1 year ago. The problem is unchanged. The problem is controlled. Associated symptoms include palpitations, peripheral edema and shortness of breath. Pertinent negatives include no chest pain or neck pain. There are no associated agents to hypertension. Risk factors for coronary artery disease include diabetes mellitus, dyslipidemia and male gender. Past treatments include diuretics. The current treatment provides moderate improvement. There are no compliance problems.  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  Diabetes  He presents for his follow-up diabetic visit. He has type 2 diabetes mellitus. His disease course has been improving. There are no hypoglycemic associated symptoms. Pertinent negatives for hypoglycemia include no nervousness/anxiousness or tremors. Associated symptoms include fatigue, polydipsia, polyuria and weakness. Pertinent negatives for diabetes include no chest pain. There are no hypoglycemic complications. Symptoms are improving. Diabetic complications include heart disease, peripheral neuropathy and PVD. Risk factors for coronary artery disease include diabetes mellitus, dyslipidemia, hypertension and male sex. Current diabetic treatment includes oral agent (dual therapy) (trulicity 8.8TG weekly. ). He is compliant with treatment most of the time. His weight is stable. He is following a generally healthy diet. Meal planning includes avoidance of concentrated sweets. He has had a previous visit with a dietitian. He participates in exercise  intermittently. His home blood glucose trend is decreasing steadily. An ACE inhibitor/angiotensin II receptor blocker is not being taken. He sees a podiatrist.Eye exam is current.    Current Medication: Outpatient Encounter Medications as of 02/26/2018  Medication Sig  . aspirin 81 MG tablet Take 81 mg by mouth daily.  Marland Kitchen atorvastatin (LIPITOR) 20 MG tablet Take 1 tablet (20 mg total) by mouth at bedtime.  . benzonatate (TESSALON) 200 MG capsule Take 1 capsule (200 mg total) by mouth 2 (two) times daily as needed for cough.  . Dulaglutide (TRULICITY) 1.5 PQ/9.8YM SOPN Inject 1.5 mg into the skin every 7 (seven) days.   . ferrous sulfate 325 (65 FE) MG tablet Take 325 mg by mouth 2 (two) times daily with a meal.  . furosemide (LASIX) 40 MG tablet Take 40 mg by mouth.  . gabapentin (NEURONTIN) 300 MG capsule TAKE 1 CAPSULE BY MOUTH DAILY AND 1 CAPSULE NIGHTLY  . glipiZIDE (GLUCOTROL) 10 MG tablet TAKE 1 TABLET BY MOUTH TWICE DAILY WITH MEALS  . ipratropium-albuterol (DUONEB) 0.5-2.5 (3) MG/3ML SOLN Take 3 mLs by nebulization every 6 (six) hours as needed.  . loratadine (CLARITIN) 10 MG tablet Take 1 tablet (10 mg total) by mouth daily.  . metFORMIN (GLUCOPHAGE) 1000 MG tablet Take 1 tablet (1,000 mg total) by mouth 2 (two) times daily with a meal.  . montelukast (SINGULAIR) 10 MG tablet Take 1 tablet (10 mg total) by mouth daily.  . Multiple Vitamin (MULTIVITAMIN WITH MINERALS) TABS tablet Take 1 tablet by mouth daily.  . ONE TOUCH ULTRA TEST test strip USE THREE TIMES DAILY  . polyethylene glycol (MIRALAX / GLYCOLAX) packet Take 17 g by mouth daily.  Marland Kitchen senna (SENOKOT) 8.6 MG tablet Take 1 tablet by mouth daily.  Marland Kitchen sulfamethoxazole-trimethoprim (BACTRIM DS,SEPTRA DS) 800-160 MG tablet Take  1 tablet by mouth 2 (two) times daily.  . Continuous Blood Gluc Sensor (FREESTYLE LIBRE SENSOR SYSTEM) MISC 14 day monitoring system. Use as directed.  E11.65  . neomycin-polymyxin-hydrocortisone (CORTISPORIN)  3.5-10000-1 ophthalmic suspension Use 4 drops in both ears BID for 7 days as needed   No facility-administered encounter medications on file as of 02/26/2018.     Surgical History: Past Surgical History:  Procedure Laterality Date  . APPENDECTOMY    . COLONOSCOPY WITH PROPOFOL N/A 06/11/2015   Procedure: COLONOSCOPY WITH PROPOFOL;  Surgeon: Manya Silvas, MD;  Location: [facility] ENDOSCOPY;  Service: Endoscopy;  Laterality: N/A;  . CORONARY ARTERY BYPASS GRAFT    . HERNIA REPAIR    . TEE WITHOUT CARDIOVERSION    . TRACHEOSTOMY    . VASCULAR SURGERY      Medical History: Past Medical History:  Diagnosis Date  . Anginal pain ([place])   . Asthma   . Coronary artery disease   . Diabetes mellitus without complication ([place])   . Hyperlipidemia   . Hypertension   . Sleep apnea     Family History: Family History  Problem Relation Age of Onset  . Cancer Sister       Review of Systems  Constitutional: Positive for chills, fatigue, fever and unexpected weight change.       Weight loss of 5 pounds since his last visit.   HENT: Positive for congestion, nosebleeds, postnasal drip, sinus pressure and sinus pain. Negative for rhinorrhea, sneezing and sore throat.   Eyes: Negative.  Negative for redness.  Respiratory: Positive for cough and shortness of breath. Negative for chest tightness.   Cardiovascular: Positive for palpitations. Negative for chest pain.  Gastrointestinal: Positive for constipation. Negative for abdominal pain, diarrhea, nausea and vomiting.  Endocrine: Positive for polydipsia and polyuria.       Improved blood sugars.   Genitourinary: Negative for dysuria and frequency.  Musculoskeletal: Positive for myalgias. Negative for arthralgias, back pain, joint swelling and neck pain.  Skin: Negative for rash.  Allergic/Immunologic: Positive for environmental allergies.  Neurological: Positive for weakness. Negative for tremors and numbness.  Hematological: Negative for  adenopathy. Does not bruise/bleed easily.  Psychiatric/Behavioral: Negative for behavioral problems (Depression), sleep disturbance and suicidal ideas. The patient is not nervous/anxious.      Today's Vitals   02/26/18 1134  BP: (!) 109/52  Pulse: 83  Resp: 16  SpO2: 100%  Weight: 163 lb (73.9 kg)  Height: '5\' 6"'  (1.676 m)    Physical Exam  Constitutional: He is oriented to person, place, and time. He appears well-developed and well-nourished. No distress.  HENT:  Head: Normocephalic and atraumatic.  Right Ear: Tympanic membrane is bulging.  Left Ear: Tympanic membrane is bulging.  Nose: Rhinorrhea present.  Mouth/Throat: Oropharynx is clear and moist. No oropharyngeal exudate.  Eyes: Pupils are equal, round, and reactive to light. Conjunctivae and EOM are normal.  Neck: Normal range of motion. Neck supple. No JVD present. Carotid bruit is not present. No tracheal deviation present. No thyromegaly present.  Cardiovascular: Normal rate and intact distal pulses. Exam reveals no gallop and no friction rub.  Murmur heard. Pulses:      Dorsalis pedis pulses are 1+ on the right side, and 1+ on the left side.       Posterior tibial pulses are 1+ on the right side, and 1+ on the left side.  Mildly irregular heart rhythm.   Pulmonary/Chest: Effort normal and breath sounds normal. No respiratory distress. He  has no rales. He exhibits no tenderness.  Congested lung fields throughout.   Abdominal: Soft. Bowel sounds are normal. There is no tenderness.  Musculoskeletal: Normal range of motion.  Feet:  Right Foot:  Protective Sensation: 8 sites tested. 8 sites sensed.  Skin Integrity: Positive for dry skin.  Left Foot:  Protective Sensation: 8 sites tested. 8 sites sensed.  Skin Integrity: Positive for dry skin.  Lymphadenopathy:    He has no cervical adenopathy.  Neurological: He is alert and oriented to person, place, and time. No cranial nerve deficit.  Skin: Skin is warm and dry.  Capillary refill takes 2 to 3 seconds. He is not diaphoretic.  Psychiatric: He has a normal mood and affect. His behavior is normal. Judgment and thought content normal.  Nursing note and vitals reviewed.    LABS: Recent Results (from the past 2160 hour(s))  Glucose, capillary     Status: Abnormal   Collection Time: 12/20/17 11:00 PM  Result Value Ref Range   Glucose-Capillary 249 (H) 65 - 99 mg/dL  Hemoglobin A1c     Status: Abnormal   Collection Time: 12/21/17  4:36 AM  Result Value Ref Range   Hgb A1c MFr Bld 8.3 (H) 4.8 - 5.6 %    Comment: (NOTE) Pre diabetes:          5.7%-6.4% Diabetes:              >6.4% Glycemic control for   <7.0% adults with diabetes    Mean Plasma Glucose 191.51 mg/dL    Comment: Performed at [facility] [address], [place], [place] [address]  Basic metabolic panel     Status: Abnormal   Collection Time: 12/21/17  4:36 AM  Result Value Ref Range   Sodium 133 (L) 135 - 145 mmol/L   Potassium 4.6 3.5 - 5.1 mmol/L   Chloride 98 (L) 101 - 111 mmol/L   CO2 26 22 - 32 mmol/L   Glucose, Bld 208 (H) 65 - 99 mg/dL   BUN 20 6 - 20 mg/dL   Creatinine, Ser 1.07 0.61 - 1.24 mg/dL   Calcium 8.7 (L) 8.9 - 10.3 mg/dL   GFR calc non Af Amer >60 >60 mL/min   GFR calc Af Amer >60 >60 mL/min    Comment: (NOTE) The eGFR has been calculated using the CKD EPI equation. This calculation has not been validated in all clinical situations. eGFR's persistently <60 mL/min signify possible Chronic Kidney Disease.    Anion gap 9 5 - 15    Comment: Performed at [facility], [address] [place], [place] [address]  Urinalysis, Complete w Microscopic     Status: Abnormal   Collection Time: 12/21/17  5:39 AM  Result Value Ref Range   Color, Urine YELLOW (A) YELLOW   APPearance HAZY (A) CLEAR   Specific Gravity, Urine 1.020 1.005 - 1.030   pH 5.0 5.0 - 8.0   Glucose, UA 50 (A) NEGATIVE mg/dL   Hgb urine dipstick NEGATIVE NEGATIVE   Bilirubin  Urine NEGATIVE NEGATIVE   Ketones, ur 5 (A) NEGATIVE mg/dL   Protein, ur NEGATIVE NEGATIVE mg/dL   Nitrite NEGATIVE NEGATIVE   Leukocytes, UA NEGATIVE NEGATIVE   RBC / HPF 0-5 0 - 5 RBC/hpf   WBC, UA 0-5 0 - 5 WBC/hpf   Bacteria, UA NONE SEEN NONE SEEN   Squamous Epithelial / LPF NONE SEEN NONE SEEN   Mucus PRESENT     Comment: Performed at  [facility], [address] [place], [place] [address]  Glucose, capillary     Status: Abnormal   Collection Time: 12/21/17  7:37 AM  Result Value Ref Range   Glucose-Capillary 176 (H) 65 - 99 mg/dL  Glucose, capillary     Status: Abnormal   Collection Time: 12/21/17 11:27 AM  Result Value Ref Range   Glucose-Capillary 201 (H) 65 - 99 mg/dL  Glucose, capillary     Status: Abnormal   Collection Time: 12/21/17  4:19 PM  Result Value Ref Range   Glucose-Capillary 195 (H) 65 - 99 mg/dL  Glucose, capillary     Status: Abnormal   Collection Time: 12/21/17  9:13 PM  Result Value Ref Range   Glucose-Capillary 256 (H) 65 - 99 mg/dL  Glucose, capillary     Status: Abnormal   Collection Time: 12/22/17  7:50 AM  Result Value Ref Range   Glucose-Capillary 151 (H) 65 - 99 mg/dL  Glucose, capillary     Status: Abnormal   Collection Time: 12/22/17 11:47 AM  Result Value Ref Range   Glucose-Capillary 250 (H) 65 - 99 mg/dL  POCT HgB A1C     Status: None   Collection Time: 12/29/17 10:32 AM  Result Value Ref Range   Hemoglobin A1C 8.5   Urinalysis, Routine w reflex microscopic     Status: None   Collection Time: 02/26/18 11:15 AM  Result Value Ref Range   Specific Gravity, UA 1.016 1.005 - 1.030   pH, UA 5.5 5.0 - 7.5   Color, UA Yellow Yellow   Appearance Ur Clear Clear   Leukocytes, UA Negative Negative   Protein, UA Negative Negative/Trace   Glucose, UA Negative Negative   Ketones, UA Negative Negative   RBC, UA Negative Negative   Bilirubin, UA Negative Negative   Urobilinogen, Ur 0.2 0.2 - 1.0 mg/dL   Nitrite, UA Negative  Negative   Microscopic Examination Comment     Comment: Microscopic not indicated and not performed.    Assessment/Plan: 1. Encounter for general adult medical examination with abnormal findings Annual health maintenance exam today.   2. Chronic otitis externa of both ears, unspecified type Cortisporin ear drops should be used twice daily for next 7 to 10 days.  - neomycin-polymyxin-hydrocortisone (CORTISPORIN) 3.5-10000-1 ophthalmic suspension; Use 4 drops in both ears BID for 7 days as needed  Dispense: 7.5 mL; Refill: 2  3. Uncontrolled type 2 diabetes mellitus with hypoglycemia, unspecified hypoglycemia coma status ([place]) Ordered Freestyle Libre glucose monitoring system to help frequent glucose checks. Continue diabetic medications as prescribed. Check urine microalbumin - Microalbumin, urine - Continuous Blood Gluc Sensor ([place]) MISC; 14 day monitoring system. Use as directed.  E11.65  Dispense: 2 each; Refill: 11  4. Coronary artery disease involving native heart without angina pectoris, unspecified vessel or lesion type Well managed. Continue regular visits with cardiology as scheduled.   5. Mixed hyperlipidemia Continue atorvastatin as prescribed.   6. Dysuria - Urinalysis, Routine w reflex microscopic  General Counseling: Daniel Hodges verbalizes understanding of the findings of todays visit and agrees with plan of treatment. I have discussed any further diagnostic evaluation that may be needed or ordered today. We also reviewed his medications today. he has been encouraged to call the office with any questions or concerns that should arise related to todays visit.    Counseling:  Diabetes Counseling:  1. Addition of ACE inh/ ARB'S for nephroprotection. 2. Diabetic foot care, prevention of complications.  3.Exercise  and lose weight.  4. Diabetic eye examination, 5. Monitor blood sugar closlely. nutrition counseling.  6.Sign and symptoms of  hypoglycemia including shaking sweating,confusion and headaches.   This patient was seen by Leretha Pol, FNP- C in Collaboration with Dr Lavera Guise as a part of collaborative care agreement  Orders Placed This Encounter  Procedures  . Urinalysis, Routine w reflex microscopic  . Microalbumin, urine    Meds ordered this encounter  Medications  . Continuous Blood Gluc Sensor (FREESTYLE LIBRE SENSOR SYSTEM) MISC    Sig: 14 day monitoring system. Use as directed.  E11.65    Dispense:  2 each    Refill:  11    Order Specific Question:   Supervising Provider    Answer:   Lavera Guise [[id number]]  . neomycin-polymyxin-hydrocortisone (CORTISPORIN) 3.5-10000-1 ophthalmic suspension    Sig: Use 4 drops in both ears BID for 7 days as needed    Dispense:  7.5 mL    Refill:  2    Order Specific Question:   Supervising Provider    Answer:   Lavera Guise [[id number]]    Time spent: [place], MD  Internal Medicine

## 2018-02-27 LAB — URINALYSIS, ROUTINE W REFLEX MICROSCOPIC
BILIRUBIN UA: NEGATIVE
Glucose, UA: NEGATIVE
Ketones, UA: NEGATIVE
LEUKOCYTES UA: NEGATIVE
Nitrite, UA: NEGATIVE
PH UA: 5.5 (ref 5.0–7.5)
PROTEIN UA: NEGATIVE
RBC UA: NEGATIVE
SPEC GRAV UA: 1.016 (ref 1.005–1.030)
Urobilinogen, Ur: 0.2 mg/dL (ref 0.2–1.0)

## 2018-03-03 ENCOUNTER — Telehealth: Payer: Self-pay | Admitting: Nurse Practitioner

## 2018-03-03 NOTE — Telephone Encounter (Signed)
Prior auth for freestyle Daniel Hodges has been denied

## 2018-03-08 ENCOUNTER — Telehealth: Payer: Self-pay

## 2018-03-08 NOTE — Telephone Encounter (Signed)
Pt wife called that his sugar is 180,160 as per heather advised keep eye on no and keep log for glucose and also change in diet because and if sugar goes to high 200 call office or go to ER

## 2018-03-20 DIAGNOSIS — E1165 Type 2 diabetes mellitus with hyperglycemia: Secondary | ICD-10-CM | POA: Insufficient documentation

## 2018-03-20 DIAGNOSIS — Z0001 Encounter for general adult medical examination with abnormal findings: Secondary | ICD-10-CM | POA: Insufficient documentation

## 2018-03-20 DIAGNOSIS — R300 Dysuria: Secondary | ICD-10-CM | POA: Insufficient documentation

## 2018-03-20 DIAGNOSIS — E11649 Type 2 diabetes mellitus with hypoglycemia without coma: Secondary | ICD-10-CM | POA: Insufficient documentation

## 2018-03-20 DIAGNOSIS — H6063 Unspecified chronic otitis externa, bilateral: Secondary | ICD-10-CM | POA: Insufficient documentation

## 2018-04-13 ENCOUNTER — Other Ambulatory Visit: Payer: Self-pay | Admitting: Nurse Practitioner

## 2018-04-13 DIAGNOSIS — E1165 Type 2 diabetes mellitus with hyperglycemia: Secondary | ICD-10-CM

## 2018-04-13 MED ORDER — METFORMIN HCL 1000 MG PO TABS: 1000.0000 mg | ORAL_TABLET | Freq: Two times a day (BID) | ORAL | 1 refills | Status: DC

## 2018-05-04 ENCOUNTER — Encounter: Payer: Self-pay | Admitting: Adult Health

## 2018-05-04 ENCOUNTER — Ambulatory Visit (INDEPENDENT_AMBULATORY_CARE_PROVIDER_SITE_OTHER): Payer: Medicare Other | Admitting: Adult Health

## 2018-05-04 VITALS — BP 108/78 | HR 83 | Temp 98.4°F | Resp 16 | Ht 66.0 in | Wt 165.0 lb

## 2018-05-04 DIAGNOSIS — I10 Essential (primary) hypertension: Secondary | ICD-10-CM

## 2018-05-04 DIAGNOSIS — R05 Cough: Secondary | ICD-10-CM | POA: Diagnosis not present

## 2018-05-04 DIAGNOSIS — E11649 Type 2 diabetes mellitus with hypoglycemia without coma: Secondary | ICD-10-CM | POA: Diagnosis not present

## 2018-05-04 DIAGNOSIS — R059 Cough, unspecified: Secondary | ICD-10-CM

## 2018-05-04 LAB — POCT GLYCOSYLATED HEMOGLOBIN (HGB A1C): Hemoglobin A1C: 8.5 % — AB (ref 4.0–5.6)

## 2018-05-04 LAB — POCT CBG (FASTING - GLUCOSE)-MANUAL ENTRY: Glucose Fasting, POC: 197 mg/dL — AB (ref 70–99)

## 2018-05-04 MED ORDER — AZITHROMYCIN 250 MG PO TABS: ORAL_TABLET | ORAL | 0 refills | Status: DC

## 2018-05-04 NOTE — Patient Instructions (Signed)

## 2018-05-04 NOTE — Progress Notes (Signed)
[facility] [place], [place] [address]  Internal MEDICINE  Office Visit Note  Patient Name: Daniel Hodges  [id number]  [id number]  Date of Service: 05/20/2018  Chief Complaint  Patient presents with  . Cough  . Diabetes     HPI Pt is here for a sick visit. Pt's wife in exam room.  She reports he has been wheezing at times. The patient complains of cough x 1 week.  He denies fever or chills.  He is mostly concerned because his blood sugar was high this morning at 237mg /dl.  He typically averages 118-145 mg/dl daily.  He denies eating anything sweet.  He is vegetarian, and eats mostly fresh vegetables and rice. His wife believes he may have an infection that is causing his blood sugar to be elevated.      Current Medication:  Outpatient Encounter Medications as of 05/04/2018  Medication Sig  . aspirin 81 MG tablet Take 81 mg by mouth daily.  Marland Kitchen atorvastatin (LIPITOR) 20 MG tablet Take 1 tablet (20 mg total) by mouth at bedtime.  . Continuous Blood Gluc Sensor (FREESTYLE LIBRE SENSOR SYSTEM) MISC 14 day monitoring system. Use as directed.  E11.65  . ferrous sulfate 325 (65 FE) MG tablet Take 325 mg by mouth 2 (two) times daily with a meal.  . furosemide (LASIX) 40 MG tablet Take 40 mg by mouth.  . gabapentin (NEURONTIN) 300 MG capsule TAKE 1 CAPSULE BY MOUTH DAILY AND 1 CAPSULE NIGHTLY  . glipiZIDE (GLUCOTROL) 10 MG tablet TAKE 1 TABLET BY MOUTH TWICE DAILY WITH MEALS  . ipratropium-albuterol (DUONEB) 0.5-2.5 (3) MG/3ML SOLN Take 3 mLs by nebulization every 6 (six) hours as needed.  . loratadine (CLARITIN) 10 MG tablet Take 1 tablet (10 mg total) by mouth daily.  . metFORMIN (GLUCOPHAGE) 1000 MG tablet Take 1 tablet (1,000 mg total) by mouth 2 (two) times daily with a meal.  . montelukast (SINGULAIR) 10 MG tablet Take 1 tablet (10 mg total) by mouth daily.  . Multiple Vitamin (MULTIVITAMIN WITH MINERALS) TABS tablet Take 1 tablet by mouth daily.  Marland Kitchen  neomycin-polymyxin-hydrocortisone (CORTISPORIN) 3.5-10000-1 ophthalmic suspension Use 4 drops in both ears BID for 7 days as needed  . ONE TOUCH ULTRA TEST test strip USE THREE TIMES DAILY  . polyethylene glycol (MIRALAX / GLYCOLAX) packet Take 17 g by mouth daily.  Marland Kitchen senna (SENOKOT) 8.6 MG tablet Take 1 tablet by mouth daily.  . [DISCONTINUED] Dulaglutide (TRULICITY) 1.5 JG/2.8ZM SOPN Inject 1.5 mg into the skin every 7 (seven) days.   Marland Kitchen azithromycin (ZITHROMAX) 250 MG tablet Take 2 tabs on day one, and 1 tab on days 2-5.  . benzonatate (TESSALON) 200 MG capsule Take 1 capsule (200 mg total) by mouth 2 (two) times daily as needed for cough. (Patient not taking: Reported on 05/04/2018)  . sulfamethoxazole-trimethoprim (BACTRIM DS,SEPTRA DS) 800-160 MG tablet Take 1 tablet by mouth 2 (two) times daily. (Patient not taking: Reported on 05/04/2018)   No facility-administered encounter medications on file as of 05/04/2018.       Medical History: Past Medical History:  Diagnosis Date  . Anginal pain ([place])   . Asthma   . Coronary artery disease   . Diabetes mellitus without complication ([place])   . Hyperlipidemia   . Hypertension   . Sleep apnea      Vital Signs: BP 108/78   Pulse 83   Temp 98.4 F (36.9 C)   Resp 16   Ht 5'  6" (1.676 m)   Wt 165 lb (74.8 kg)   SpO2 96%   BMI 26.63 kg/m    Review of Systems  Constitutional: Negative.  Negative for chills, fatigue and unexpected weight change.  HENT: Negative.  Negative for congestion, rhinorrhea, sneezing and sore throat.   Eyes: Negative for redness.  Respiratory: Negative.  Negative for cough, chest tightness and shortness of breath.   Cardiovascular: Negative.  Negative for chest pain and palpitations.  Gastrointestinal: Negative.  Negative for abdominal pain, constipation, diarrhea, nausea and vomiting.  Endocrine: Negative.   Genitourinary: Negative.  Negative for dysuria and frequency.  Musculoskeletal: Negative.  Negative  for arthralgias, back pain, joint swelling and neck pain.  Skin: Negative.  Negative for rash.  Allergic/Immunologic: Negative.   Neurological: Negative.  Negative for tremors and numbness.  Hematological: Negative for adenopathy. Does not bruise/bleed easily.  Psychiatric/Behavioral: Negative.  Negative for behavioral problems, sleep disturbance and suicidal ideas. The patient is not nervous/anxious.     Physical Exam  Constitutional: He is oriented to person, place, and time. He appears well-developed and well-nourished. No distress.  HENT:  Head: Normocephalic and atraumatic.  Mouth/Throat: Oropharynx is clear and moist. No oropharyngeal exudate.  Eyes: Pupils are equal, round, and reactive to light. EOM are normal.  Neck: Normal range of motion. Neck supple. No JVD present. No tracheal deviation present. No thyromegaly present.  Cardiovascular: Normal rate, regular rhythm and normal heart sounds. Exam reveals no gallop and no friction rub.  No murmur heard. Pulmonary/Chest: Effort normal. No respiratory distress. He has no wheezes. He has no rales. He exhibits no tenderness.  Decreased breath sounds in bilateral bases.    Abdominal: Soft. There is no tenderness. There is no guarding.  Musculoskeletal: Normal range of motion.  Lymphadenopathy:    He has no cervical adenopathy.  Neurological: He is alert and oriented to person, place, and time. No cranial nerve deficit.  Skin: Skin is warm and dry. He is not diaphoretic.  Psychiatric: He has a normal mood and affect. His behavior is normal. Judgment and thought content normal.  Nursing note and vitals reviewed.  Assessment/Plan: 1. Uncontrolled type 2 diabetes mellitus with hypoglycemia, unspecified hypoglycemia coma status ([place]) FSBS during visit was 197mg /dl.  HgA1c 8.5 today, which is the same as in April of this year. Pt eats candy at times, and we discussed how his diet is affecting his blood sugar.   2. Cough Azithromycin  250mg  tablets given for inflammation and infection prevention.  3. Essential hypertension Continue current management.   General Counseling: Daniel Hodges verbalizes understanding of the findings of todays visit and agrees with plan of treatment. I have discussed any further diagnostic evaluation that may be needed or ordered today. We also reviewed his medications today. he has been encouraged to call the office with any questions or concerns that should arise related to todays visit.   Orders Placed This Encounter  Procedures  . POCT HgB A1C  . POCT CBG (Fasting - Glucose)    Meds ordered this encounter  Medications  . azithromycin (ZITHROMAX) 250 MG tablet    Sig: Take 2 tabs on day one, and 1 tab on days 2-5.    Dispense:  6 tablet    Refill:  0    Order Specific Question:   Supervising Provider    Answer:   Lavera Guise [[id number]]    Time spent: 25 Minutes  This patient was seen by Orson Gear AGNP-C in Collaboration with Dr  Lavera Guise as a part of collaborative care agreement

## 2018-05-07 ENCOUNTER — Other Ambulatory Visit: Payer: Self-pay

## 2018-05-07 MED ORDER — DULAGLUTIDE 1.5 MG/0.5ML ~~LOC~~ SOAJ: 1.5000 mg | SUBCUTANEOUS | 3 refills | Status: DC

## 2018-05-07 NOTE — Progress Notes (Signed)
error 

## 2018-05-20 ENCOUNTER — Other Ambulatory Visit: Payer: Self-pay

## 2018-05-20 MED ORDER — GLIPIZIDE 10 MG PO TABS: 10.0000 mg | ORAL_TABLET | Freq: Two times a day (BID) | ORAL | 0 refills | Status: DC

## 2018-05-28 ENCOUNTER — Ambulatory Visit: Payer: Medicare Other | Admitting: Nurse Practitioner

## 2018-05-28 ENCOUNTER — Encounter: Payer: Self-pay | Admitting: Nurse Practitioner

## 2018-05-28 VITALS — BP 136/69 | HR 88 | Resp 16 | Ht 66.0 in | Wt 163.2 lb

## 2018-05-28 DIAGNOSIS — I2510 Atherosclerotic heart disease of native coronary artery without angina pectoris: Secondary | ICD-10-CM | POA: Diagnosis not present

## 2018-05-28 DIAGNOSIS — I10 Essential (primary) hypertension: Secondary | ICD-10-CM

## 2018-05-28 DIAGNOSIS — E1165 Type 2 diabetes mellitus with hyperglycemia: Secondary | ICD-10-CM

## 2018-05-28 DIAGNOSIS — J069 Acute upper respiratory infection, unspecified: Secondary | ICD-10-CM

## 2018-05-28 LAB — POCT GLYCOSYLATED HEMOGLOBIN (HGB A1C): Hemoglobin A1C: 8.2 % — AB (ref 4.0–5.6)

## 2018-05-28 MED ORDER — AZITHROMYCIN 250 MG PO TABS: ORAL_TABLET | ORAL | 0 refills | Status: DC

## 2018-05-28 NOTE — Progress Notes (Signed)
[facility] [place], [place] [address]  Internal MEDICINE  Office Visit Note  Patient Name: Daniel Hodges  [id number]  [id number]  Date of Service: 06/09/2018  Chief Complaint  Patient presents with  . Diabetes    3 month follow up    Hypertension  This is a chronic problem. The current episode started more than 1 year ago. The problem is unchanged. The problem is controlled. Associated symptoms include peripheral edema. Pertinent negatives include no chest pain, headaches, neck pain, palpitations or shortness of breath. There are no associated agents to hypertension. Risk factors for coronary artery disease include diabetes mellitus, dyslipidemia and male gender. Past treatments include diuretics. The current treatment provides moderate improvement. There are no compliance problems.  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  Diabetes  He presents for his follow-up diabetic visit. He has type 2 diabetes mellitus. His disease course has been improving. There are no hypoglycemic associated symptoms. Pertinent negatives for hypoglycemia include no dizziness, headaches, nervousness/anxiousness or tremors. Associated symptoms include fatigue. Pertinent negatives for diabetes include no chest pain. There are no hypoglycemic complications. Symptoms are improving. Diabetic complications include heart disease, peripheral neuropathy and PVD. Risk factors for coronary artery disease include diabetes mellitus, dyslipidemia, hypertension and male sex. Current diabetic treatment includes oral agent (dual therapy) (trulicity 1.5mg  weekly. ). He is compliant with treatment most of the time. His weight is stable. He is following a generally healthy diet. Meal planning includes avoidance of concentrated sweets. He has had a previous visit with a dietitian. He participates in exercise intermittently. His home blood glucose trend is decreasing steadily. An ACE  inhibitor/angiotensin II receptor blocker is not being taken. He sees a podiatrist.Eye exam is current.       Current Medication: Outpatient Encounter Medications as of 05/28/2018  Medication Sig  . aspirin 81 MG tablet Take 81 mg by mouth daily.  Marland Kitchen atorvastatin (LIPITOR) 20 MG tablet Take 1 tablet (20 mg total) by mouth at bedtime.  Marland Kitchen azithromycin (ZITHROMAX) 250 MG tablet Take 2 tabs on day one, and 1 tab on days 2-5.  Marland Kitchen Continuous Blood Gluc Sensor (FREESTYLE LIBRE SENSOR SYSTEM) MISC 14 day monitoring system. Use as directed.  E11.65  . Dulaglutide (TRULICITY) 1.5 VO/3.5KK SOPN Inject 1.5 mg into the skin every 7 (seven) days.  . ferrous sulfate 325 (65 FE) MG tablet Take 325 mg by mouth 2 (two) times daily with a meal.  . furosemide (LASIX) 40 MG tablet Take 40 mg by mouth.  Marland Kitchen glipiZIDE (GLUCOTROL) 10 MG tablet Take 1 tablet (10 mg total) by mouth 2 (two) times daily with a meal.  . ipratropium-albuterol (DUONEB) 0.5-2.5 (3) MG/3ML SOLN Take 3 mLs by nebulization every 6 (six) hours as needed.  . loratadine (CLARITIN) 10 MG tablet Take 1 tablet (10 mg total) by mouth daily.  . metFORMIN (GLUCOPHAGE) 1000 MG tablet Take 1 tablet (1,000 mg total) by mouth 2 (two) times daily with a meal.  . montelukast (SINGULAIR) 10 MG tablet Take 1 tablet (10 mg total) by mouth daily.  . Multiple Vitamin (MULTIVITAMIN WITH MINERALS) TABS tablet Take 1 tablet by mouth daily.  Marland Kitchen neomycin-polymyxin-hydrocortisone (CORTISPORIN) 3.5-10000-1 ophthalmic suspension Use 4 drops in both ears BID for 7 days as needed  . ONE TOUCH ULTRA TEST test strip USE THREE TIMES DAILY  . polyethylene glycol (MIRALAX / GLYCOLAX) packet Take 17 g by mouth daily.  Marland Kitchen senna (SENOKOT) 8.6 MG tablet Take 1 tablet by  mouth daily.  . [DISCONTINUED] azithromycin (ZITHROMAX) 250 MG tablet Take 2 tabs on day one, and 1 tab on days 2-5.  . [DISCONTINUED] gabapentin (NEURONTIN) 300 MG capsule TAKE 1 CAPSULE BY MOUTH DAILY AND 1 CAPSULE  NIGHTLY  . benzonatate (TESSALON) 200 MG capsule Take 1 capsule (200 mg total) by mouth 2 (two) times daily as needed for cough. (Patient not taking: Reported on 05/04/2018)  . sulfamethoxazole-trimethoprim (BACTRIM DS,SEPTRA DS) 800-160 MG tablet Take 1 tablet by mouth 2 (two) times daily. (Patient not taking: Reported on 05/04/2018)   No facility-administered encounter medications on file as of 05/28/2018.     Surgical History: Past Surgical History:  Procedure Laterality Date  . APPENDECTOMY    . COLONOSCOPY WITH PROPOFOL N/A 06/11/2015   Procedure: COLONOSCOPY WITH PROPOFOL;  Surgeon: Manya Silvas, MD;  Location: [facility] ENDOSCOPY;  Service: Endoscopy;  Laterality: N/A;  . CORONARY ARTERY BYPASS GRAFT    . HERNIA REPAIR    . TEE WITHOUT CARDIOVERSION    . TRACHEOSTOMY    . VASCULAR SURGERY      Medical History: Past Medical History:  Diagnosis Date  . Anginal pain ([place])   . Asthma   . Coronary artery disease   . Diabetes mellitus without complication ([place])   . Hyperlipidemia   . Hypertension   . Sleep apnea     Family History: Family History  Problem Relation Age of Onset  . Cancer Sister     Social History   Socioeconomic History  . Marital status: Married    Spouse name: Not on file  . Number of children: Not on file  . Years of education: Not on file  . Highest education level: Not on file  Occupational History  . Not on file  Social Needs  . Financial resource strain: Not on file  . Food insecurity:    Worry: Not on file    Inability: Not on file  . Transportation needs:    Medical: Not on file    Non-medical: Not on file  Tobacco Use  . Smoking status: Never Smoker  . Smokeless tobacco: Never Used  Substance and Sexual Activity  . Alcohol use: No  . Drug use: No  . Sexual activity: Not on file  Lifestyle  . Physical activity:    Days per week: Not on file    Minutes per session: Not on file  . Stress: Not on file  Relationships  . Social  connections:    Talks on phone: Not on file    Gets together: Not on file    Attends religious service: Not on file    Active member of club or organization: Not on file    Attends meetings of clubs or organizations: Not on file    Relationship status: Not on file  . Intimate partner violence:    Fear of current or ex partner: Not on file    Emotionally abused: Not on file    Physically abused: Not on file    Forced sexual activity: Not on file  Other Topics Concern  . Not on file  Social History Narrative  . Not on file      Review of Systems  Constitutional: Positive for fatigue. Negative for chills, fever and unexpected weight change.       Marland Kitchen   HENT: Negative for congestion, nosebleeds, postnasal drip, rhinorrhea, sinus pressure, sinus pain, sneezing and sore throat.   Eyes: Negative.  Negative for redness.  Respiratory: Negative  for cough, chest tightness, shortness of breath and wheezing.   Cardiovascular: Negative for chest pain and palpitations.  Gastrointestinal: Positive for constipation. Negative for abdominal pain, diarrhea, nausea and vomiting.  Endocrine:       Improved blood sugars.   Genitourinary: Negative.  Negative for dysuria and frequency.  Musculoskeletal: Positive for myalgias. Negative for arthralgias, back pain, joint swelling and neck pain.  Skin: Negative for rash.  Allergic/Immunologic: Positive for environmental allergies.  Neurological: Negative for dizziness, tremors, numbness and headaches.  Hematological: Negative for adenopathy. Does not bruise/bleed easily.  Psychiatric/Behavioral: Negative for behavioral problems (Depression), sleep disturbance and suicidal ideas. The patient is not nervous/anxious.     Today's Vitals   05/28/18 1210  BP: 136/69  Pulse: 88  Resp: 16  SpO2: 97%  Weight: 163 lb 3.2 oz (74 kg)  Height: 5\' 6"  (1.676 m)    Physical Exam  Constitutional: He is oriented to person, place, and time. He appears well-developed  and well-nourished. No distress.  HENT:  Head: Normocephalic and atraumatic.  Nose: Rhinorrhea present.  Mouth/Throat: Oropharynx is clear and moist. No oropharyngeal exudate.  Eyes: Pupils are equal, round, and reactive to light. Conjunctivae and EOM are normal.  Neck: Normal range of motion. Neck supple. No JVD present. No tracheal deviation present. No thyromegaly present.  Cardiovascular: Normal rate and normal heart sounds. Exam reveals no gallop and no friction rub.  No murmur heard. MILDY IRREGULAR HEART RHYTHM   Pulmonary/Chest: Effort normal and breath sounds normal. No respiratory distress. He has no wheezes. He has no rales. He exhibits no tenderness.  Intermittent, congested, non-productive cough present.   Abdominal: Soft. Bowel sounds are normal. There is no tenderness.  Musculoskeletal: Normal range of motion.  Lymphadenopathy:    He has no cervical adenopathy.  Neurological: He is alert and oriented to person, place, and time. No cranial nerve deficit.  Skin: Skin is warm and dry. He is not diaphoretic.  Psychiatric: He has a normal mood and affect. His behavior is normal. Judgment and thought content normal.  Nursing note and vitals reviewed.  Assessment/Plan: 1. Uncontrolled type 2 diabetes mellitus with hyperglycemia (HCC) - POCT HgB A1C 8.2, showing gradual improvement. No changes made in medications, however, did review dietary and lifestyle changes needed to lower sugars without changing medication.   2. Acute upper respiratory infection z-pack. Take as directed for 5 days. Take OTC medication to alleviate symptoms.  - azithromycin (ZITHROMAX) 250 MG tablet; Take 2 tabs on day one, and 1 tab on days 2-5.  Dispense: 6 tablet; Refill: 0  3. Essential hypertension Stable. Continue bp medication as prescribed.   4. Coronary artery disease involving native heart without angina pectoris, unspecified vessel or lesion type Regular visits with cardiology as scheduled.    General Counseling: Kalib verbalizes understanding of the findings of todays visit and agrees with plan of treatment. I have discussed any further diagnostic evaluation that may be needed or ordered today. We also reviewed his medications today. he has been encouraged to call the office with any questions or concerns that should arise related to todays visit.    Orders Placed This Encounter  Procedures  . POCT HgB A1C    Meds ordered this encounter  Medications  . azithromycin (ZITHROMAX) 250 MG tablet    Sig: Take 2 tabs on day one, and 1 tab on days 2-5.    Dispense:  6 tablet    Refill:  0    Order Specific Question:  Supervising Provider    Answer:   Lavera Guise [[id number]]    Time spent: 77 Minutes      Dr Lavera Guise Internal medicine

## 2018-06-07 ENCOUNTER — Other Ambulatory Visit: Payer: Self-pay | Admitting: Nurse Practitioner

## 2018-06-07 MED ORDER — GABAPENTIN 300 MG PO CAPS: ORAL_CAPSULE | ORAL | 0 refills | Status: DC

## 2018-07-12 DIAGNOSIS — G4733 Obstructive sleep apnea (adult) (pediatric): Secondary | ICD-10-CM | POA: Diagnosis not present

## 2018-08-06 ENCOUNTER — Encounter: Payer: Self-pay | Admitting: Nurse Practitioner

## 2018-08-06 ENCOUNTER — Ambulatory Visit (INDEPENDENT_AMBULATORY_CARE_PROVIDER_SITE_OTHER): Payer: Medicare Other | Admitting: Nurse Practitioner

## 2018-08-06 VITALS — BP 131/68 | HR 86 | Resp 16 | Ht 66.0 in | Wt 166.0 lb

## 2018-08-06 DIAGNOSIS — J90 Pleural effusion, not elsewhere classified: Secondary | ICD-10-CM | POA: Insufficient documentation

## 2018-08-06 DIAGNOSIS — E1165 Type 2 diabetes mellitus with hyperglycemia: Secondary | ICD-10-CM | POA: Diagnosis not present

## 2018-08-06 DIAGNOSIS — I10 Essential (primary) hypertension: Secondary | ICD-10-CM

## 2018-08-06 DIAGNOSIS — Z9989 Dependence on other enabling machines and devices: Secondary | ICD-10-CM

## 2018-08-06 DIAGNOSIS — R0602 Shortness of breath: Secondary | ICD-10-CM | POA: Diagnosis not present

## 2018-08-06 DIAGNOSIS — I2510 Atherosclerotic heart disease of native coronary artery without angina pectoris: Secondary | ICD-10-CM

## 2018-08-06 DIAGNOSIS — G4733 Obstructive sleep apnea (adult) (pediatric): Secondary | ICD-10-CM

## 2018-08-06 HISTORY — DX: Pleural effusion, not elsewhere classified: J90

## 2018-08-06 LAB — POCT GLYCOSYLATED HEMOGLOBIN (HGB A1C): Hemoglobin A1C: 8.9 % — AB (ref 4.0–5.6)

## 2018-08-06 MED ORDER — ALBUTEROL SULFATE HFA 108 (90 BASE) MCG/ACT IN AERS: 2.0000 | INHALATION_SPRAY | Freq: Four times a day (QID) | RESPIRATORY_TRACT | 2 refills | Status: DC | PRN

## 2018-08-06 MED ORDER — SEMAGLUTIDE(0.25 OR 0.5MG/DOS) 2 MG/1.5ML ~~LOC~~ SOPN: 0.5000 mg | PEN_INJECTOR | SUBCUTANEOUS | 5 refills | Status: DC

## 2018-08-06 NOTE — Progress Notes (Signed)
[facility] [place], [place] [address]  Internal MEDICINE  Office Visit Note  Patient Name: Daniel Hodges  [id number]  [id number]  Date of Service: 08/08/2018  Chief Complaint  Patient presents with  . Medical Management of Chronic Issues    3 month follow up. pt need medication refills  . Diabetes    pt concerned that his blood sugars are elevated    The patient is here for routine follow up exam. He states that his blood sugars have been elevated recently. He reports eating more sweets than normal, recently. Just celebrated his New Year, and celebrations included a lot of food, and not always food which is good for diabetes. He states that he feels good. Denies shortness of breath, chest pain, abdominal pain, or other negative symptoms. He states that constipation has been improved over the past few months.       Current Medication: Outpatient Encounter Medications as of 08/06/2018  Medication Sig Note  . aspirin 81 MG tablet Take 81 mg by mouth daily.   Marland Kitchen atorvastatin (LIPITOR) 20 MG tablet Take 1 tablet (20 mg total) by mouth at bedtime.   . Continuous Blood Gluc Sensor (FREESTYLE LIBRE SENSOR SYSTEM) MISC 14 day monitoring system. Use as directed.  E11.65   . ferrous sulfate 325 (65 FE) MG tablet Take 325 mg by mouth 2 (two) times daily with a meal.   . furosemide (LASIX) 40 MG tablet Take 40 mg by mouth.   . gabapentin (NEURONTIN) 300 MG capsule TAKE 1 CAPSULE BY MOUTH DAILY AND 1 CAPSULE NIGHTLY   . glipiZIDE (GLUCOTROL) 10 MG tablet Take 1 tablet (10 mg total) by mouth 2 (two) times daily with a meal.   . ipratropium-albuterol (DUONEB) 0.5-2.5 (3) MG/3ML SOLN Take 3 mLs by nebulization every 6 (six) hours as needed.   . loratadine (CLARITIN) 10 MG tablet Take 1 tablet (10 mg total) by mouth daily.   . metFORMIN (GLUCOPHAGE) 1000 MG tablet Take 1 tablet (1,000 mg total) by mouth 2 (two) times daily with a meal.   . montelukast (SINGULAIR) 10  MG tablet Take 1 tablet (10 mg total) by mouth daily.   . Multiple Vitamin (MULTIVITAMIN WITH MINERALS) TABS tablet Take 1 tablet by mouth daily.   Marland Kitchen neomycin-polymyxin-hydrocortisone (CORTISPORIN) 3.5-10000-1 ophthalmic suspension Use 4 drops in both ears BID for 7 days as needed   . ONE TOUCH ULTRA TEST test strip USE THREE TIMES DAILY   . polyethylene glycol (MIRALAX / GLYCOLAX) packet Take 17 g by mouth daily.   Marland Kitchen senna (SENOKOT) 8.6 MG tablet Take 1 tablet by mouth daily.   . [DISCONTINUED] azithromycin (ZITHROMAX) 250 MG tablet Take 2 tabs on day one, and 1 tab on days 2-5.   . [DISCONTINUED] Dulaglutide (TRULICITY) 1.5 QQ/5.9DG SOPN Inject 1.5 mg into the skin every 7 (seven) days. 08/06/2018: change to ozempic  . albuterol (PROVENTIL HFA;VENTOLIN HFA) 108 (90 Base) MCG/ACT inhaler Inhale 2 puffs into the lungs every 6 (six) hours as needed for wheezing or shortness of breath.   . benzonatate (TESSALON) 200 MG capsule Take 1 capsule (200 mg total) by mouth 2 (two) times daily as needed for cough. (Patient not taking: Reported on 05/04/2018)   . Semaglutide,0.25 or 0.5MG /DOS, (OZEMPIC, 0.25 OR 0.5 MG/DOSE,) 2 MG/1.5ML SOPN Inject 0.5 mg into the skin once a week.   . [DISCONTINUED] sulfamethoxazole-trimethoprim (BACTRIM DS,SEPTRA DS) 800-160 MG tablet Take 1 tablet by mouth 2 (two) times daily. (Patient  not taking: Reported on 05/04/2018)    No facility-administered encounter medications on file as of 08/06/2018.     Surgical History: Past Surgical History:  Procedure Laterality Date  . APPENDECTOMY    . COLONOSCOPY WITH PROPOFOL N/A 06/11/2015   Procedure: COLONOSCOPY WITH PROPOFOL;  Surgeon: Manya Silvas, MD;  Location: [facility] ENDOSCOPY;  Service: Endoscopy;  Laterality: N/A;  . CORONARY ARTERY BYPASS GRAFT    . HERNIA REPAIR    . TEE WITHOUT CARDIOVERSION    . TRACHEOSTOMY    . VASCULAR SURGERY      Medical History: Past Medical History:  Diagnosis Date  . Anginal pain ([place])    . Asthma   . Coronary artery disease   . Diabetes mellitus without complication ([place])   . Hyperlipidemia   . Hypertension   . Sleep apnea     Family History: Family History  Problem Relation Age of Onset  . Cancer Sister     Social History   Socioeconomic History  . Marital status: Married    Spouse name: Not on file  . Number of children: Not on file  . Years of education: Not on file  . Highest education level: Not on file  Occupational History  . Not on file  Social Needs  . Financial resource strain: Not on file  . Food insecurity:    Worry: Not on file    Inability: Not on file  . Transportation needs:    Medical: Not on file    Non-medical: Not on file  Tobacco Use  . Smoking status: Never Smoker  . Smokeless tobacco: Never Used  Substance and Sexual Activity  . Alcohol use: No  . Drug use: No  . Sexual activity: Not on file  Lifestyle  . Physical activity:    Days per week: Not on file    Minutes per session: Not on file  . Stress: Not on file  Relationships  . Social connections:    Talks on phone: Not on file    Gets together: Not on file    Attends religious service: Not on file    Active member of club or organization: Not on file    Attends meetings of clubs or organizations: Not on file    Relationship status: Not on file  . Intimate partner violence:    Fear of current or ex partner: Not on file    Emotionally abused: Not on file    Physically abused: Not on file    Forced sexual activity: Not on file  Other Topics Concern  . Not on file  Social History Narrative  . Not on file      Review of Systems  Constitutional: Negative for activity change, chills, fatigue and unexpected weight change.  HENT: Negative for congestion, postnasal drip, rhinorrhea, sneezing and sore throat.   Respiratory: Negative for cough, chest tightness, shortness of breath and wheezing.   Cardiovascular: Negative for chest pain and palpitations.   Gastrointestinal: Positive for constipation. Negative for abdominal pain, diarrhea, nausea and vomiting.  Endocrine: Negative for cold intolerance, heat intolerance, polydipsia, polyphagia and polyuria.       Blood sugars elevated recently.   Musculoskeletal: Negative for arthralgias, back pain, joint swelling and neck pain.  Skin: Negative for rash.  Allergic/Immunologic: Positive for environmental allergies.  Neurological: Negative for dizziness, tremors, numbness and headaches.  Hematological: Negative for adenopathy. Does not bruise/bleed easily.  Psychiatric/Behavioral: Negative for behavioral problems (Depression), sleep disturbance and suicidal ideas. The  patient is not nervous/anxious.     Today's Vitals   08/06/18 1007  BP: 131/68  Pulse: 86  Resp: 16  SpO2: 98%  Weight: 166 lb (75.3 kg)  Height: 5\' 6"  (1.676 m)    Physical Exam  Constitutional: He is oriented to person, place, and time. He appears well-developed and well-nourished. No distress.  HENT:  Head: Normocephalic and atraumatic.  Nose: Nose normal.  Mouth/Throat: No oropharyngeal exudate.  Eyes: Pupils are equal, round, and reactive to light. Conjunctivae and EOM are normal.  Neck: Normal range of motion. Neck supple. No JVD present. No tracheal deviation present. No thyromegaly present.  Cardiovascular: Normal rate, regular rhythm and normal heart sounds. Exam reveals no gallop and no friction rub.  No murmur heard. Irregular heart rhythm with soft, systolic murmur present.   Pulmonary/Chest: Effort normal and breath sounds normal. No respiratory distress. He has no wheezes. He has no rales. He exhibits no tenderness.  Abdominal: Soft. Bowel sounds are normal. There is no tenderness.  Musculoskeletal: Normal range of motion.  Lymphadenopathy:    He has no cervical adenopathy.  Neurological: He is alert and oriented to person, place, and time. No cranial nerve deficit.  Skin: Skin is warm and dry. He is not  diaphoretic.  Psychiatric: He has a normal mood and affect. His behavior is normal. Judgment and thought content normal.  Nursing note and vitals reviewed.   Assessment/Plan: 1. Uncontrolled type 2 diabetes mellitus with hyperglycemia (HCC) - POCT HgB A1C 8.9 today. D/c trulicity. Start ozempic 0.5mg  weekly. Sample provided today. Continue metformin as prescribed. Continue to monitor blood sugars closely.  - Semaglutide,0.25 or 0.5MG /DOS, (OZEMPIC, 0.25 OR 0.5 MG/DOSE,) 2 MG/1.5ML SOPN; Inject 0.5 mg into the skin once a week.  Dispense: 1 pen; Refill: 5  2. Essential hypertension Stable. Continue bp medication as prescribed   3. Coronary artery disease involving native heart without angina pectoris, unspecified vessel or lesion type Continue regular visits with cardiology as scheduled.   4. Shortness of breath Renewed albuterol rescue inhaler. May use every 4 to 6 hours as needed for wheezing and shortness of breath.  - albuterol (PROVENTIL HFA;VENTOLIN HFA) 108 (90 Base) MCG/ACT inhaler; Inhale 2 puffs into the lungs every 6 (six) hours as needed for wheezing or shortness of breath.  Dispense: 1 Inhaler; Refill: 2  5. OSA on CPAP Continue regular visits with Dr. Devona Konig for CPAP management.  General Counseling: Bilaal verbalizes understanding of the findings of todays visit and agrees with plan of treatment. I have discussed any further diagnostic evaluation that may be needed or ordered today. We also reviewed his medications today. he has been encouraged to call the office with any questions or concerns that should arise related to todays visit.  Diabetes Counseling:  1. Addition of ACE inh/ ARB'S for nephroprotection. Microalbumin is updated  2. Diabetic foot care, prevention of complications. Podiatry consult 3. Exercise and lose weight.  4. Diabetic eye examination, Diabetic eye exam is updated  5. Monitor blood sugar closlely. nutrition counseling.  6. Sign and symptoms  of hypoglycemia including shaking sweating,confusion and headaches.  This patient was seen by Leretha Pol FNP Collaboration with Dr Lavera Guise as a part of collaborative care agreement  Orders Placed This Encounter  Procedures  . POCT HgB A1C    Meds ordered this encounter  Medications  . Semaglutide,0.25 or 0.5MG /DOS, (OZEMPIC, 0.25 OR 0.5 MG/DOSE,) 2 MG/1.5ML SOPN    Sig: Inject 0.5 mg into the  skin once a week.    Dispense:  1 pen    Refill:  5    trulicity too expensive and not working. Change to ozempic. Patient provided sample today and has copay coupon.    Order Specific Question:   Supervising Provider    Answer:   Lavera Guise [[id number]]  . albuterol (PROVENTIL HFA;VENTOLIN HFA) 108 (90 Base) MCG/ACT inhaler    Sig: Inhale 2 puffs into the lungs every 6 (six) hours as needed for wheezing or shortness of breath.    Dispense:  1 Inhaler    Refill:  2    Order Specific Question:   Supervising Provider    Answer:   Lavera Guise [[id number]]    Time spent: 31 Minutes      Dr Lavera Guise Internal medicine

## 2018-08-11 ENCOUNTER — Other Ambulatory Visit: Payer: Self-pay

## 2018-08-11 DIAGNOSIS — J4520 Mild intermittent asthma, uncomplicated: Secondary | ICD-10-CM

## 2018-08-11 MED ORDER — IPRATROPIUM-ALBUTEROL 0.5-2.5 (3) MG/3ML IN SOLN: 3.0000 mL | Freq: Four times a day (QID) | RESPIRATORY_TRACT | 3 refills | Status: DC | PRN

## 2018-08-16 ENCOUNTER — Other Ambulatory Visit: Payer: Self-pay

## 2018-08-16 DIAGNOSIS — J301 Allergic rhinitis due to pollen: Secondary | ICD-10-CM

## 2018-08-16 MED ORDER — LORATADINE 10 MG PO TABS: 10.0000 mg | ORAL_TABLET | Freq: Every day | ORAL | 5 refills | Status: DC

## 2018-08-17 ENCOUNTER — Ambulatory Visit: Payer: Self-pay | Admitting: Internal Medicine

## 2018-08-24 ENCOUNTER — Other Ambulatory Visit: Payer: Self-pay | Admitting: Internal Medicine

## 2018-08-24 ENCOUNTER — Ambulatory Visit: Payer: Medicare Other | Admitting: Internal Medicine

## 2018-08-24 ENCOUNTER — Encounter: Payer: Self-pay | Admitting: Internal Medicine

## 2018-08-24 VITALS — BP 118/62 | HR 88 | Resp 16 | Ht 66.0 in | Wt 165.0 lb

## 2018-08-24 DIAGNOSIS — R0602 Shortness of breath: Secondary | ICD-10-CM

## 2018-08-24 DIAGNOSIS — G4733 Obstructive sleep apnea (adult) (pediatric): Secondary | ICD-10-CM | POA: Diagnosis not present

## 2018-08-24 DIAGNOSIS — Z9989 Dependence on other enabling machines and devices: Secondary | ICD-10-CM

## 2018-08-24 DIAGNOSIS — J4520 Mild intermittent asthma, uncomplicated: Secondary | ICD-10-CM | POA: Diagnosis not present

## 2018-08-24 DIAGNOSIS — Z889 Allergy status to unspecified drugs, medicaments and biological substances status: Secondary | ICD-10-CM

## 2018-08-24 NOTE — Patient Instructions (Signed)

## 2018-08-24 NOTE — Progress Notes (Signed)
[facility] [place], [place] [address]  Pulmonary Sleep Medicine   Office Visit Note  Patient Name: Daniel Hodges DOB: 1941/02/20 MRN [id number]  Date of Service: 08/24/2018  Complaints/HPI: Pt is here for follow up on OSA, asthma, and seasonal allergies.  Overall He reports she has been doing fair.  He does continue to report some intermittent shortness of breath and reports needing her inhalers.  He takes multiple allergy medications including Flonase.  His compliance with CPAP is excellent and he reports improvement of symptoms since beginning to wear it.  ROS  General: (-) fever, (-) chills, (-) night sweats, (-) weakness Skin: (-) rashes, (-) itching,. Eyes: (-) visual changes, (-) redness, (-) itching. Nose and Sinuses: (-) nasal stuffiness or itchiness, (-) postnasal drip, (-) nosebleeds, (-) sinus trouble. Mouth and Throat: (-) sore throat, (-) hoarseness. Neck: (-) swollen glands, (-) enlarged thyroid, (-) neck pain. Respiratory: - cough, (-) bloody sputum, - shortness of breath, - wheezing. Cardiovascular: - ankle swelling, (-) chest pain. Lymphatic: (-) lymph node enlargement. Neurologic: (-) numbness, (-) tingling. Psychiatric: (-) anxiety, (-) depression   Current Medication: Outpatient Encounter Medications as of 08/24/2018  Medication Sig  . albuterol (PROVENTIL HFA;VENTOLIN HFA) 108 (90 Base) MCG/ACT inhaler Inhale 2 puffs into the lungs every 6 (six) hours as needed for wheezing or shortness of breath.  Marland Kitchen aspirin 81 MG tablet Take 81 mg by mouth daily.  Marland Kitchen atorvastatin (LIPITOR) 20 MG tablet Take 1 tablet (20 mg total) by mouth at bedtime.  . Continuous Blood Gluc Sensor (FREESTYLE LIBRE SENSOR SYSTEM) MISC 14 day monitoring system. Use as directed.  E11.65  . ferrous sulfate 325 (65 FE) MG tablet Take 325 mg by mouth 2 (two) times daily with a meal.  . furosemide (LASIX) 40 MG tablet Take 40 mg by mouth.  . gabapentin (NEURONTIN)  300 MG capsule TAKE 1 CAPSULE BY MOUTH DAILY AND 1 CAPSULE NIGHTLY  . glipiZIDE (GLUCOTROL) 10 MG tablet Take 1 tablet (10 mg total) by mouth 2 (two) times daily with a meal.  . ipratropium-albuterol (DUONEB) 0.5-2.5 (3) MG/3ML SOLN Take 3 mLs by nebulization every 6 (six) hours as needed.  . loratadine (CLARITIN) 10 MG tablet Take 1 tablet (10 mg total) by mouth daily.  . metFORMIN (GLUCOPHAGE) 1000 MG tablet Take 1 tablet (1,000 mg total) by mouth 2 (two) times daily with a meal.  . montelukast (SINGULAIR) 10 MG tablet Take 1 tablet (10 mg total) by mouth daily.  . Multiple Vitamin (MULTIVITAMIN WITH MINERALS) TABS tablet Take 1 tablet by mouth daily.  Marland Kitchen neomycin-polymyxin-hydrocortisone (CORTISPORIN) 3.5-10000-1 ophthalmic suspension Use 4 drops in both ears BID for 7 days as needed  . ONE TOUCH ULTRA TEST test strip USE THREE TIMES DAILY  . polyethylene glycol (MIRALAX / GLYCOLAX) packet Take 17 g by mouth daily.  . Semaglutide,0.25 or 0.5MG /DOS, (OZEMPIC, 0.25 OR 0.5 MG/DOSE,) 2 MG/1.5ML SOPN Inject 0.5 mg into the skin once a week.  . senna (SENOKOT) 8.6 MG tablet Take 1 tablet by mouth daily.  . benzonatate (TESSALON) 200 MG capsule Take 1 capsule (200 mg total) by mouth 2 (two) times daily as needed for cough. (Patient not taking: Reported on 08/24/2018)   No facility-administered encounter medications on file as of 08/24/2018.     Surgical History: Past Surgical History:  Procedure Laterality Date  . APPENDECTOMY    . COLONOSCOPY WITH PROPOFOL N/A 06/11/2015   Procedure: COLONOSCOPY WITH PROPOFOL;  Surgeon: Gavin Pound  Vira Agar, MD;  Location: [facility];  Service: Endoscopy;  Laterality: N/A;  . CORONARY ARTERY BYPASS GRAFT    . HERNIA REPAIR    . TEE WITHOUT CARDIOVERSION    . TRACHEOSTOMY    . VASCULAR SURGERY      Medical History: Past Medical History:  Diagnosis Date  . Anginal pain ([place])   . Asthma   . Coronary artery disease   . Diabetes mellitus without  complication ([place])   . Hyperlipidemia   . Hypertension   . Sleep apnea     Family History: Family History  Problem Relation Age of Onset  . Cancer Sister     Social History: Social History   Socioeconomic History  . Marital status: Married    Spouse name: Not on file  . Number of children: Not on file  . Years of education: Not on file  . Highest education level: Not on file  Occupational History  . Not on file  Social Needs  . Financial resource strain: Not on file  . Food insecurity:    Worry: Not on file    Inability: Not on file  . Transportation needs:    Medical: Not on file    Non-medical: Not on file  Tobacco Use  . Smoking status: Never Smoker  . Smokeless tobacco: Never Used  Substance and Sexual Activity  . Alcohol use: No  . Drug use: No  . Sexual activity: Not on file  Lifestyle  . Physical activity:    Days per week: Not on file    Minutes per session: Not on file  . Stress: Not on file  Relationships  . Social connections:    Talks on phone: Not on file    Gets together: Not on file    Attends religious service: Not on file    Active member of club or organization: Not on file    Attends meetings of clubs or organizations: Not on file    Relationship status: Not on file  . Intimate partner violence:    Fear of current or ex partner: Not on file    Emotionally abused: Not on file    Physically abused: Not on file    Forced sexual activity: Not on file  Other Topics Concern  . Not on file  Social History Narrative  . Not on file    Vital Signs: Blood pressure 118/62, pulse 88, resp. rate 16, height 5\' 6"  (1.676 m), weight 165 lb (74.8 kg), SpO2 96 %.  Examination: General Appearance: The patient is well-developed, well-nourished, and in no distress. Skin: Gross inspection of skin unremarkable. Head: normocephalic, no gross deformities. Eyes: no gross deformities noted. ENT: ears appear grossly normal no exudates. Neck: Supple. No  thyromegaly. No LAD. Respiratory: clear bilateraly . Cardiovascular: Normal S1 and S2 without murmur or rub. Extremities: No cyanosis. pulses are equal. Neurologic: Alert and oriented. No involuntary movements.  LABS: Recent Results (from the past 2160 hour(s))  POCT HgB A1C     Status: Abnormal   Collection Time: 05/28/18  2:17 PM  Result Value Ref Range   Hemoglobin A1C 8.2 (A) 4.0 - 5.6 %   HbA1c POC (<> result, manual entry)     HbA1c, POC (prediabetic range)     HbA1c, POC (controlled diabetic range)    POCT HgB A1C     Status: Abnormal   Collection Time: 08/06/18 10:38 AM  Result Value Ref Range   Hemoglobin A1C 8.9 (A) 4.0 - 5.6 %  HbA1c POC (<> result, manual entry)     HbA1c, POC (prediabetic range)     HbA1c, POC (controlled diabetic range)      Radiology: Dg Abd 1 View  Result Date: 12/21/2017 CLINICAL DATA:  Lower abdominal pain with constipation EXAM: ABDOMEN - 1 VIEW COMPARISON:  CT abdomen and pelvis December 20, 2017 FINDINGS: There remain loops of mildly dilated bowel without air-fluid levels. No free air. There is moderate stool in the colon. IMPRESSION: Bowel gas pattern suggests enteritis or ileus. A degree of small bowel obstruction cannot be entirely excluded. No free air. Appearance appears similar to CT 1 day prior. Electronically Signed   By: Lowella Grip III M.D.   On: 12/21/2017 14:06   Dg Chest Port 1 View  Result Date: 12/20/2017 CLINICAL DATA:  Testing diffuse abdominal pain onset today. EXAM: PORTABLE CHEST 1 VIEW COMPARISON:  08/26/2016 FINDINGS: Prior median sternotomy and CABG. Low lung volumes. Mild cardiomegaly. No edema. Mild atelectasis or scarring at both lung bases. IMPRESSION: 1. No acute thoracic findings. 2. Mild atelectasis or scarring at the lung bases. 3. Cardiomegaly.  Prior CABG. Electronically Signed   By: Van Clines M.D.   On: 12/20/2017 15:59   Ct Renal Stone Study  Result Date: 12/20/2017 CLINICAL DATA:  Abdominal pain  onset at 1 p.m. today. Pallor and diaphoresis. EXAM: CT ABDOMEN AND PELVIS WITHOUT CONTRAST TECHNIQUE: Multidetector CT imaging of the abdomen and pelvis was performed following the standard protocol without IV contrast. COMPARISON:  Overlapping portions of CT chest from 01/07/2008 FINDINGS: Lower chest: Scarring in the right middle lobe and lingula. Coronary atherosclerotic calcification. Small type 1 hiatal hernia. Hepatobiliary: Unremarkable Pancreas: Scattered punctate calcifications in the pancreatic parenchyma compatible with chronic calcific pancreatitis. Spleen: Unremarkable Adrenals/Urinary Tract: Unremarkable Stomach/Bowel: Postoperative findings in the right colon. Multiple dilated loops of proximal small bowel are present some containing air-fluid levels at differing vertical levels. These lead to mid abdominal loops of small bowel which are mildly dilated and mildly thick-walled. There is a gradual transition to nondilated ileum. A specific cause for obstruction is not identified. No abscess or extraluminal gas. No portal venous gas. Vascular/Lymphatic: Aortoiliac atherosclerotic vascular disease. No appreciable adenopathy. Reproductive: The prostate gland measures 6.2 by 4.2 by 5.3 cm (volume = 72 cm^3). Other: No supplemental non-categorized findings. Musculoskeletal: Median sternotomy. Indirect left inguinal hernia containing adipose tissues. No appreciable herniated bowel. IMPRESSION: 1. Multiple loops of dilated small proximal small bowel, with a gradual transition to normal caliber in an area where there is some mild bowel wall thickening but no obvious cause for obstruction/transition. Very subtle edema in the mesentery along the involved loops. There are some postoperative findings in the right colon but the intercalary anastomosis does not appear to be a site of obstruction. The appearance could be due to enteritis causing local ileus. 2. Other imaging findings of potential clinical  significance: Aortoiliac atherosclerotic vascular disease. Coronary atherosclerosis. Small type 1 hiatal hernia. Chronic calcific pancreatitis. Prostatomegaly. Indirect left inguinal hernia contains adipose tissues (no herniated bowel). Electronically Signed   By: Van Clines M.D.   On: 12/20/2017 15:58    No results found.  No results found.    Assessment and Plan: Patient Active Problem List   Diagnosis Date Noted  . Pleural effusion 08/06/2018  . Encounter for general adult medical examination with abnormal findings 03/20/2018  . Chronic otitis externa of both ears 03/20/2018  . Uncontrolled type 2 diabetes mellitus with hypoglycemia ([place]) 03/20/2018  . Dysuria  03/20/2018  . Shortness of breath 01/22/2018  . Cough 01/14/2018  . Constipation 12/30/2017  . Non-seasonal allergic rhinitis due to pollen 12/30/2017  . Dehydration 12/30/2017  . Essential hypertension 12/29/2017  . Uncontrolled type 2 diabetes mellitus with hyperglycemia ([place]) 12/27/2017  . Acute upper respiratory infection 12/27/2017  . Need for vaccination against Streptococcus pneumoniae using pneumococcal conjugate vaccine 13 12/27/2017  . Coronary artery disease involving native heart without angina pectoris 12/27/2017  . Mixed hyperlipidemia 12/27/2017  . AKI (acute kidney injury) ([place]) 12/20/2017  . OSA on CPAP 02/01/2016  . Aortic ejection murmur 08/14/2015  . Tracheostomy in place [facility]) 09/22/2013  . Ileus ([place]) 09/20/2013  . Anemia 09/19/2013  . Hypercarbia 09/19/2013  . Postoperative anemia due to acute blood loss 09/06/2013  . Thrombocytopenia ([place]) 09/06/2013  . 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 Abnormal stress ECG 09/01/2013  . Asthma 09/01/2013  . S/P appendectomy 09/01/2013    1. OSA on CPAP Continue CPAP with current settings.  Most recent compliance shows excellent compliance.  2. Mild intermittent asthma in adult without complication Stable, patient will continue  use Symbicort and other medications to control his asthma.  3. H/O seasonal allergies Patient should continue to use OTC allergy medications as discussed.  4. SOB (shortness of breath) - Spirometry with Graph  General Counseling: I have discussed the findings of the evaluation and examination with Elin.  I have also discussed any further diagnostic evaluation thatmay be needed or ordered today. Anna verbalizes understanding of the findings of todays visit. We also reviewed his medications today and discussed drug interactions and side effects including but not limited excessive drowsiness and altered mental states. We also discussed that there is always a risk not just to him but also people around him. he has been encouraged to call the office with any questions or concerns that should arise related to todays visit.    Time spent: 25 minutes  I have personally obtained a history, examined the patient, evaluated laboratory and imaging results, formulated the assessment and plan and placed orders.    Allyne Gee, MD [facility] Sleep medicine

## 2018-08-30 ENCOUNTER — Encounter: Payer: Self-pay | Admitting: Nurse Practitioner

## 2018-08-30 ENCOUNTER — Other Ambulatory Visit: Payer: Self-pay

## 2018-08-30 MED ORDER — GLIPIZIDE 10 MG PO TABS: 10.0000 mg | ORAL_TABLET | Freq: Two times a day (BID) | ORAL | 1 refills | Status: DC

## 2018-09-13 ENCOUNTER — Other Ambulatory Visit: Payer: Self-pay

## 2018-09-13 MED ORDER — ATORVASTATIN CALCIUM 20 MG PO TABS: 20.0000 mg | ORAL_TABLET | Freq: Every day | ORAL | 1 refills | Status: DC

## 2018-09-16 ENCOUNTER — Other Ambulatory Visit: Payer: Self-pay

## 2018-09-16 DIAGNOSIS — J301 Allergic rhinitis due to pollen: Secondary | ICD-10-CM

## 2018-09-16 MED ORDER — MONTELUKAST SODIUM 10 MG PO TABS: 10.0000 mg | ORAL_TABLET | Freq: Every day | ORAL | 5 refills | Status: DC

## 2018-09-21 ENCOUNTER — Other Ambulatory Visit: Payer: Self-pay

## 2018-09-21 ENCOUNTER — Telehealth: Payer: Self-pay

## 2018-09-21 NOTE — Telephone Encounter (Signed)
Can you find out what dose of ozempic he is taking? thanks

## 2018-09-22 ENCOUNTER — Other Ambulatory Visit: Payer: Self-pay

## 2018-09-22 MED ORDER — DOCUSATE SODIUM 100 MG PO CAPS: 100.0000 mg | ORAL_CAPSULE | Freq: Two times a day (BID) | ORAL | 2 refills | Status: DC | PRN

## 2018-09-22 NOTE — Telephone Encounter (Signed)
Pt wife said he is taking 71 ( she did not specify mg or anything else).

## 2018-09-23 ENCOUNTER — Other Ambulatory Visit: Payer: Self-pay | Admitting: Nurse Practitioner

## 2018-09-23 DIAGNOSIS — E1165 Type 2 diabetes mellitus with hyperglycemia: Secondary | ICD-10-CM

## 2018-09-23 MED ORDER — SEMAGLUTIDE (1 MG/DOSE) 2 MG/1.5ML ~~LOC~~ SOPN: 1.0000 mg | PEN_INJECTOR | SUBCUTANEOUS | 5 refills | Status: DC

## 2018-09-23 NOTE — Telephone Encounter (Signed)
Pt wife was notified that ozempic was changed to 1 mg and sent to pharmacy. Advised pt wife for pt to take this when he regularly takes ozempic.

## 2018-09-23 NOTE — Progress Notes (Signed)
Changed ozempic from 0.5mg  to 1mg  and sent new prescription to his pharmacy. He should start new dose when he would take usual dose of ozempic.

## 2018-09-23 NOTE — Telephone Encounter (Signed)
Please let them know Changed ozempic from 0.5mg  to 1mg  and sent new prescription to his pharmacy. He should start new dose when he would take usual dose of ozempic. thanks

## 2018-09-27 ENCOUNTER — Other Ambulatory Visit: Payer: Self-pay

## 2018-09-27 MED ORDER — GABAPENTIN 300 MG PO CAPS: ORAL_CAPSULE | ORAL | 0 refills | Status: DC

## 2018-10-05 ENCOUNTER — Ambulatory Visit (INDEPENDENT_AMBULATORY_CARE_PROVIDER_SITE_OTHER): Payer: Medicare Other | Admitting: Nurse Practitioner

## 2018-10-05 ENCOUNTER — Telehealth: Payer: Self-pay | Admitting: Nurse Practitioner

## 2018-10-05 ENCOUNTER — Encounter: Payer: Self-pay | Admitting: Nurse Practitioner

## 2018-10-05 VITALS — BP 130/80 | HR 92 | Resp 16 | Ht 66.0 in | Wt 165.0 lb

## 2018-10-05 DIAGNOSIS — I10 Essential (primary) hypertension: Secondary | ICD-10-CM | POA: Diagnosis not present

## 2018-10-05 DIAGNOSIS — I2510 Atherosclerotic heart disease of native coronary artery without angina pectoris: Secondary | ICD-10-CM | POA: Diagnosis not present

## 2018-10-05 DIAGNOSIS — E1165 Type 2 diabetes mellitus with hyperglycemia: Secondary | ICD-10-CM | POA: Diagnosis not present

## 2018-10-05 MED ORDER — DAPAGLIFLOZIN PROPANEDIOL 5 MG PO TABS: 5.0000 mg | ORAL_TABLET | Freq: Every day | ORAL | 3 refills | Status: DC

## 2018-10-05 MED ORDER — METFORMIN HCL ER (MOD) 1000 MG PO TB24: 1000.0000 mg | ORAL_TABLET | Freq: Every day | ORAL | 3 refills | Status: DC

## 2018-10-05 NOTE — Telephone Encounter (Signed)
Prior authorization for medications Metformin 1000mg  and farxiga 5 mg has been approved by insurance pharmacy has been contacted

## 2018-10-05 NOTE — Progress Notes (Signed)
[facility] [place], [place] [address]  Internal MEDICINE  Office Visit Note  Patient Name: KONSTANTIN LEHNEN  [id number]  [id number]  Date of Service: 10/13/2018   Pt is here for a sick visit.  Chief Complaint  Patient presents with  . Diabetes    elevated glucose  . Hypertension  . Hyperlipidemia     The patient is here for sick visit. He is complaining of elevated blood sugars, despite changing/ increasing diabetic medication. His blood sugar log showing blod sugars running over 150 consistently. He is taking metformin 1000mg  twice daily, glipizide 10mg  twice daily and now ozempic 1mg  once weekly. States that he feels good, jsut worried that blood sugars are not doing well.        Current Medication:  Outpatient Encounter Medications as of 10/05/2018  Medication Sig  . albuterol (PROVENTIL HFA;VENTOLIN HFA) 108 (90 Base) MCG/ACT inhaler Inhale 2 puffs into the lungs every 6 (six) hours as needed for wheezing or shortness of breath.  Marland Kitchen aspirin 81 MG tablet Take 81 mg by mouth daily.  Marland Kitchen atorvastatin (LIPITOR) 20 MG tablet Take 1 tablet (20 mg total) by mouth at bedtime.  . benzonatate (TESSALON) 200 MG capsule Take 1 capsule (200 mg total) by mouth 2 (two) times daily as needed for cough.  . Continuous Blood Gluc Sensor (FREESTYLE LIBRE SENSOR SYSTEM) MISC 14 day monitoring system. Use as directed.  E11.65  . docusate sodium (COLACE) 100 MG capsule Take 1 capsule (100 mg total) by mouth 2 (two) times daily as needed for mild constipation.  . ferrous sulfate 325 (65 FE) MG tablet Take 325 mg by mouth 2 (two) times daily with a meal.  . furosemide (LASIX) 40 MG tablet Take 40 mg by mouth.  . furosemide (LASIX) 40 MG tablet TAKE 1/2 TABLET BY MOUTH EVERY DAY  . gabapentin (NEURONTIN) 300 MG capsule TAKE 1 CAPSULE BY MOUTH DAILY AND 1 CAPSULE NIGHTLY  . glipiZIDE (GLUCOTROL) 10 MG tablet Take 1 tablet (10 mg total) by mouth 2 (two) times daily with a  meal.  . ipratropium-albuterol (DUONEB) 0.5-2.5 (3) MG/3ML SOLN Take 3 mLs by nebulization every 6 (six) hours as needed.  . loratadine (CLARITIN) 10 MG tablet Take 1 tablet (10 mg total) by mouth daily.  . montelukast (SINGULAIR) 10 MG tablet Take 1 tablet (10 mg total) by mouth daily.  . Multiple Vitamin (MULTIVITAMIN WITH MINERALS) TABS tablet Take 1 tablet by mouth daily.  Marland Kitchen neomycin-polymyxin-hydrocortisone (CORTISPORIN) 3.5-10000-1 ophthalmic suspension Use 4 drops in both ears BID for 7 days as needed  . ONE TOUCH ULTRA TEST test strip USE THREE TIMES DAILY  . polyethylene glycol (MIRALAX / GLYCOLAX) packet Take 17 g by mouth daily.  Marland Kitchen senna (SENOKOT) 8.6 MG tablet Take 1 tablet by mouth daily.  . [DISCONTINUED] metFORMIN (GLUCOPHAGE) 1000 MG tablet Take 1 tablet (1,000 mg total) by mouth 2 (two) times daily with a meal.  . [DISCONTINUED] Semaglutide, 1 MG/DOSE, (OZEMPIC, 1 MG/DOSE,) 2 MG/1.5ML SOPN Inject 1 mg into the skin once a week.  . dapagliflozin propanediol (FARXIGA) 5 MG TABS tablet Take 5 mg by mouth daily.  . metFORMIN (GLUMETZA) 1000 MG (MOD) 24 hr tablet Take 1 tablet (1,000 mg total) by mouth daily with breakfast.   No facility-administered encounter medications on file as of 10/05/2018.       Medical History: Past Medical History:  Diagnosis Date  . Anginal pain ([place])   . Asthma   .  Coronary artery disease   . Diabetes mellitus without complication ([place])   . Hyperlipidemia   . Hypertension   . Sleep apnea      Today's Vitals   10/05/18 1353  BP: 130/80  Pulse: 92  Resp: 16  SpO2: 100%  Weight: 165 lb (74.8 kg)  Height: 5\' 6"  (1.676 m)   Body mass index is 26.63 kg/m.  Review of Systems  Constitutional: Negative for activity change, chills, fatigue and unexpected weight change.  HENT: Negative for congestion, postnasal drip, rhinorrhea, sneezing and sore throat.   Respiratory: Negative for cough, chest tightness, shortness of breath and wheezing.    Cardiovascular: Negative for chest pain and palpitations.  Gastrointestinal: Positive for constipation. Negative for abdominal pain, diarrhea, nausea and vomiting.  Endocrine: Negative for cold intolerance, heat intolerance, polydipsia and polyuria.       Blood sugars elevated recently.   Musculoskeletal: Negative for arthralgias, back pain, joint swelling and neck pain.  Skin: Negative for rash.  Allergic/Immunologic: Positive for environmental allergies.  Neurological: Negative for dizziness, tremors, numbness and headaches.  Hematological: Negative for adenopathy. Does not bruise/bleed easily.  Psychiatric/Behavioral: Negative for behavioral problems (Depression), sleep disturbance and suicidal ideas. The patient is not nervous/anxious.     Physical Exam Vitals signs and nursing note reviewed.  Constitutional:      General: He is not in acute distress.    Appearance: Normal appearance. He is well-developed. He is not diaphoretic.  HENT:     Head: Normocephalic and atraumatic.     Nose: Nose normal.     Mouth/Throat:     Pharynx: No oropharyngeal exudate.  Eyes:     Conjunctiva/sclera: Conjunctivae normal.     Pupils: Pupils are equal, round, and reactive to light.  Neck:     Musculoskeletal: Normal range of motion and neck supple.     Thyroid: No thyromegaly.     Vascular: No JVD.     Trachea: No tracheal deviation.  Cardiovascular:     Rate and Rhythm: Normal rate and regular rhythm.     Heart sounds: Normal heart sounds. No murmur. No friction rub. No gallop.      Comments: Irregular heart rhythm with soft, systolic murmur present.  Pulmonary:     Effort: Pulmonary effort is normal. No respiratory distress.     Breath sounds: Normal breath sounds. No wheezing or rales.  Chest:     Chest wall: No tenderness.  Abdominal:     General: Bowel sounds are normal.     Palpations: Abdomen is soft.     Tenderness: There is no abdominal tenderness.  Musculoskeletal: Normal  range of motion.  Lymphadenopathy:     Cervical: No cervical adenopathy.  Skin:    General: Skin is warm and dry.  Neurological:     Mental Status: He is alert and oriented to person, place, and time.     Cranial Nerves: No cranial nerve deficit.  Psychiatric:        Behavior: Behavior normal.        Thought Content: Thought content normal.        Judgment: Judgment normal.    Assessment/Plan: 1. Uncontrolled type 2 diabetes mellitus with hyperglycemia ([place]) Reviewed blood sugar log, showing sugars between 200 and 300. Start farxiga 5mg  daily. Samples provided today. Continue other diabetic medication as prescribed.  - metFORMIN (GLUMETZA) 1000 MG (MOD) 24 hr tablet; Take 1 tablet (1,000 mg total) by mouth daily with breakfast.  Dispense: 30 tablet; Refill:  3 - dapagliflozin propanediol (FARXIGA) 5 MG TABS tablet; Take 5 mg by mouth daily.  Dispense: 30 tablet; Refill: 3  2. Essential hypertension Stable. Continue bp medication as prescribed   3. Coronary artery disease involving native heart without angina pectoris, unspecified vessel or lesion type Stable. Regular visits with cardiology as scheduled.   General Counseling: Machi verbalizes understanding of the findings of todays visit and agrees with plan of treatment. I have discussed any further diagnostic evaluation that may be needed or ordered today. We also reviewed his medications today. he has been encouraged to call the office with any questions or concerns that should arise related to todays visit.    Counseling:  Diabetes Counseling:  1. Addition of ACE inh/ ARB'S for nephroprotection. Microalbumin is updated  2. Diabetic foot care, prevention of complications. Podiatry consult 3. Exercise and lose weight.  4. Diabetic eye examination, Diabetic eye exam is updated  5. Monitor blood sugar closlely. nutrition counseling.  6. Sign and symptoms of hypoglycemia including shaking sweating,confusion and  headaches.  This patient was seen by [place] with Dr Lavera Guise as a part of collaborative care agreement  Meds ordered this encounter  Medications  . metFORMIN (GLUMETZA) 1000 MG (MOD) 24 hr tablet    Sig: Take 1 tablet (1,000 mg total) by mouth daily with breakfast.    Dispense:  30 tablet    Refill:  3    Please note change from IR to ER metfornin.    Order Specific Question:   Supervising Provider    Answer:   Lavera Guise [[id number]]  . dapagliflozin propanediol (FARXIGA) 5 MG TABS tablet    Sig: Take 5 mg by mouth daily.    Dispense:  30 tablet    Refill:  3    D/c ozempic. Samples farxiga provided today. Should be approved as patient also has history of CAD and congestive heart failure.    Order Specific Question:   Supervising Provider    Answer:   Lavera Guise [[id number]]    Time spent: 25 Minutes

## 2018-10-12 ENCOUNTER — Telehealth: Payer: Self-pay

## 2018-10-12 NOTE — Telephone Encounter (Signed)
As per heather advised pt wife take 2 tab of farxiga 5 mg  and keep log of glucose

## 2018-10-12 NOTE — Telephone Encounter (Signed)
Can you find out if it was 5 or 10mg ? I think it was 5mg .

## 2018-10-13 ENCOUNTER — Other Ambulatory Visit: Payer: Self-pay

## 2018-10-13 DIAGNOSIS — E1165 Type 2 diabetes mellitus with hyperglycemia: Secondary | ICD-10-CM

## 2018-10-13 MED ORDER — METFORMIN HCL ER (MOD) 1000 MG PO TB24
1000.0000 mg | ORAL_TABLET | Freq: Every day | ORAL | 3 refills | Status: DC
Start: 2018-10-13 — End: 2020-02-21

## 2018-10-15 ENCOUNTER — Telehealth: Payer: Self-pay | Admitting: Nurse Practitioner

## 2018-10-15 NOTE — Telephone Encounter (Signed)
Ms Groeneveld called and states that the yearly visit from insurance company came out and tested his A1c and it was 10.6 and suggested that they call here and let us know and to get a referral out to endocrinology. I told Ms Anspach I would let you know and call back.

## 2018-10-15 NOTE — Telephone Encounter (Signed)
Ok. The last one we did was 8.9 in 07/2018. We can do referral to endocrinology. That should be fine.

## 2018-10-15 NOTE — Telephone Encounter (Signed)
Please refer to endocrinology

## 2018-10-29 DIAGNOSIS — I2510 Atherosclerotic heart disease of native coronary artery without angina pectoris: Secondary | ICD-10-CM | POA: Diagnosis not present

## 2018-10-29 DIAGNOSIS — R0602 Shortness of breath: Secondary | ICD-10-CM | POA: Diagnosis not present

## 2018-10-29 DIAGNOSIS — I10 Essential (primary) hypertension: Secondary | ICD-10-CM | POA: Diagnosis not present

## 2018-10-29 DIAGNOSIS — E782 Mixed hyperlipidemia: Secondary | ICD-10-CM | POA: Diagnosis not present

## 2018-10-29 DIAGNOSIS — Z951 Presence of aortocoronary bypass graft: Secondary | ICD-10-CM | POA: Diagnosis not present

## 2018-11-03 ENCOUNTER — Encounter: Payer: Self-pay | Admitting: Adult Health

## 2018-11-03 ENCOUNTER — Ambulatory Visit (INDEPENDENT_AMBULATORY_CARE_PROVIDER_SITE_OTHER): Payer: Medicare Other | Admitting: Adult Health

## 2018-11-03 VITALS — BP 126/74 | HR 93 | Temp 98.1°F | Resp 16 | Ht 66.0 in | Wt 165.0 lb

## 2018-11-03 DIAGNOSIS — R7309 Other abnormal glucose: Secondary | ICD-10-CM | POA: Diagnosis not present

## 2018-11-03 DIAGNOSIS — G4733 Obstructive sleep apnea (adult) (pediatric): Secondary | ICD-10-CM

## 2018-11-03 DIAGNOSIS — E1165 Type 2 diabetes mellitus with hyperglycemia: Secondary | ICD-10-CM | POA: Diagnosis not present

## 2018-11-03 LAB — GLUCOSE, POCT (MANUAL RESULT ENTRY): POC GLUCOSE: 281 mg/dL — AB (ref 70–99)

## 2018-11-03 NOTE — Progress Notes (Signed)
[facility] [place], [place] [address]  Internal MEDICINE  Office Visit Note  Patient Name: Daniel Hodges  [id number]  [id number]  Date of Service: 11/03/2018  Chief Complaint  Patient presents with  . Diabetes    sugars , yesterday was 222 ,   . Heart Murmur    cardiologist says he has a murmur  . Sleep Apnea    needs a new sleep study     HPI Pt is here for a sick visit. Pt is in exam room with wife.  Patient's wife is very concerned because patient's blood sugar has been greater than 200 for a few weeks.  He has not been on the 26th of this month with endocrinology.  She would like for him to stop taking his Wilder Glade to restart the Trulicity.  She feels like the Ozempic he was taking is not working.  She is also reporting that the cardiologist Dr. Nehemiah Massed found a heart murmur as ordered a echo and stress test on March 4..  And that he also would like for the patient to have another sleep study since this murmur is a new finding.     Current Medication:  Outpatient Encounter Medications as of 11/03/2018  Medication Sig  . albuterol (PROVENTIL HFA;VENTOLIN HFA) 108 (90 Base) MCG/ACT inhaler Inhale 2 puffs into the lungs every 6 (six) hours as needed for wheezing or shortness of breath.  Ilean Skill Lipoic Acid-Fenugreek-Cr (ALPHA LIP AC-FENUGREEK-CR ER PO) Take by mouth.  Marland Kitchen aspirin 81 MG tablet Take 81 mg by mouth daily.  Marland Kitchen atorvastatin (LIPITOR) 20 MG tablet Take 1 tablet (20 mg total) by mouth at bedtime.  . dapagliflozin propanediol (FARXIGA) 5 MG TABS tablet Take 5 mg by mouth daily.  Marland Kitchen glipiZIDE (GLUCOTROL) 10 MG tablet Take 1 tablet (10 mg total) by mouth 2 (two) times daily with a meal.  . benzonatate (TESSALON) 200 MG capsule Take 1 capsule (200 mg total) by mouth 2 (two) times daily as needed for cough.  . Continuous Blood Gluc Sensor (FREESTYLE LIBRE SENSOR SYSTEM) MISC 14 day monitoring system. Use as directed.  E11.65  . docusate sodium  (COLACE) 100 MG capsule Take 1 capsule (100 mg total) by mouth 2 (two) times daily as needed for mild constipation.  . ferrous sulfate 325 (65 FE) MG tablet Take 325 mg by mouth 2 (two) times daily with a meal.  . furosemide (LASIX) 40 MG tablet Take 40 mg by mouth.  . furosemide (LASIX) 40 MG tablet TAKE 1/2 TABLET BY MOUTH EVERY DAY  . gabapentin (NEURONTIN) 300 MG capsule TAKE 1 CAPSULE BY MOUTH DAILY AND 1 CAPSULE NIGHTLY  . ipratropium-albuterol (DUONEB) 0.5-2.5 (3) MG/3ML SOLN Take 3 mLs by nebulization every 6 (six) hours as needed.  . loratadine (CLARITIN) 10 MG tablet Take 1 tablet (10 mg total) by mouth daily.  . metFORMIN (GLUMETZA) 1000 MG (MOD) 24 hr tablet Take 1 tablet (1,000 mg total) by mouth daily with breakfast.  . montelukast (SINGULAIR) 10 MG tablet Take 1 tablet (10 mg total) by mouth daily.  . Multiple Vitamin (MULTIVITAMIN WITH MINERALS) TABS tablet Take 1 tablet by mouth daily.  Marland Kitchen neomycin-polymyxin-hydrocortisone (CORTISPORIN) 3.5-10000-1 ophthalmic suspension Use 4 drops in both ears BID for 7 days as needed  . ONE TOUCH ULTRA TEST test strip USE THREE TIMES DAILY  . polyethylene glycol (MIRALAX / GLYCOLAX) packet Take 17 g by mouth daily.  Marland Kitchen senna (SENOKOT) 8.6 MG tablet Take 1 tablet  by mouth daily.   No facility-administered encounter medications on file as of 11/03/2018.       Medical History: Past Medical History:  Diagnosis Date  . Anginal pain ([place])   . Asthma   . Coronary artery disease   . Diabetes mellitus without complication ([place])   . Hyperlipidemia   . Hypertension   . Sleep apnea      Vital Signs: BP 126/74   Pulse 93   Temp 98.1 F (36.7 C) (Oral)   Resp 16   Ht 5\' 6"  (1.676 m)   Wt 165 lb (74.8 kg)   SpO2 95%   BMI 26.63 kg/m    Review of Systems  Constitutional: Negative.  Negative for chills, fatigue and unexpected weight change.  HENT: Negative.  Negative for congestion, rhinorrhea, sneezing and sore throat.   Eyes:  Negative for redness.  Respiratory: Negative.  Negative for cough, chest tightness and shortness of breath.   Cardiovascular: Negative.  Negative for chest pain and palpitations.  Gastrointestinal: Negative.  Negative for abdominal pain, constipation, diarrhea, nausea and vomiting.  Endocrine: Negative.   Genitourinary: Negative.  Negative for dysuria and frequency.  Musculoskeletal: Negative.  Negative for arthralgias, back pain, joint swelling and neck pain.  Skin: Negative.  Negative for rash.  Allergic/Immunologic: Negative.   Neurological: Negative.  Negative for tremors and numbness.  Hematological: Negative for adenopathy. Does not bruise/bleed easily.  Psychiatric/Behavioral: Negative.  Negative for behavioral problems, sleep disturbance and suicidal ideas. The patient is not nervous/anxious.     Physical Exam Vitals signs and nursing note reviewed.  Constitutional:      General: He is not in acute distress.    Appearance: He is well-developed. He is not diaphoretic.  HENT:     Head: Normocephalic and atraumatic.     Mouth/Throat:     Pharynx: No oropharyngeal exudate.  Eyes:     Pupils: Pupils are equal, round, and reactive to light.  Neck:     Musculoskeletal: Normal range of motion and neck supple.     Thyroid: No thyromegaly.     Vascular: No JVD.     Trachea: No tracheal deviation.  Cardiovascular:     Rate and Rhythm: Normal rate and regular rhythm.     Heart sounds: Murmur present. No friction rub. No gallop.   Pulmonary:     Effort: Pulmonary effort is normal. No respiratory distress.     Breath sounds: Normal breath sounds. No wheezing or rales.  Chest:     Chest wall: No tenderness.  Abdominal:     Palpations: Abdomen is soft.     Tenderness: There is no abdominal tenderness. There is no guarding.  Musculoskeletal: Normal range of motion.  Lymphadenopathy:     Cervical: No cervical adenopathy.  Skin:    General: Skin is warm and dry.  Neurological:      Mental Status: He is alert and oriented to person, place, and time.     Cranial Nerves: No cranial nerve deficit.  Psychiatric:        Behavior: Behavior normal.        Thought Content: Thought content normal.        Judgment: Judgment normal.    Assessment/Plan: 1. Uncontrolled type 2 diabetes mellitus with hyperglycemia ([place]) Patient's blood sugar is elevated.  His last A1c was done on November 22 and was 8.9.  In this time since his A1c his blood sugars have climbed back up to over 200.  Had a  very long conversation with the patient and his wife about diet as well as exercise.  Also discussed that it may be for the patient to do sliding scale insulin but since they have an appointment with endocrinology next week the endocrinologist is the specialist and will most likely change their current regimen.  Patient's wife was inconsolable and demanding Trulicity sample to give patient to try to improve his blood sugar in the week before going to see endocrinology.  That sample was provided at this time.  She also reports that she is going to stop giving him his Wilder Glade and he will only take the glipizide for the Trulicity and his metformin.  I have encouraged her to follow-up with endocrinology as planned and establish a new plan for his treatment of his diabetes.  2. OSA (obstructive sleep apnea) New sleep study ordered for patient. - [facility] SLEEP STUDY; Future  3. High glucose level Patient's glucose in office today 281. - POCT glucose (manual entry)  General Counseling: Larsen verbalizes understanding of the findings of todays visit and agrees with plan of treatment. I have discussed any further diagnostic evaluation that may be needed or ordered today. We also reviewed his medications today. he has been encouraged to call the office with any questions or concerns that should arise related to todays visit.   Orders Placed This Encounter  Procedures  . POCT glucose (manual entry)    No  orders of the defined types were placed in this encounter.   Time spent: 30 Minutes  This patient was seen by Orson Gear AGNP-C in Collaboration with Dr Lavera Guise as a part of collaborative care agreement.  Kendell Bane AGNP-C Internal Medicine

## 2018-11-05 ENCOUNTER — Ambulatory Visit: Payer: Self-pay | Admitting: Nurse Practitioner

## 2018-11-10 ENCOUNTER — Other Ambulatory Visit (INDEPENDENT_AMBULATORY_CARE_PROVIDER_SITE_OTHER): Payer: Medicare Other | Admitting: Internal Medicine

## 2018-11-10 DIAGNOSIS — I2510 Atherosclerotic heart disease of native coronary artery without angina pectoris: Secondary | ICD-10-CM | POA: Diagnosis not present

## 2018-11-10 DIAGNOSIS — E1165 Type 2 diabetes mellitus with hyperglycemia: Secondary | ICD-10-CM | POA: Diagnosis not present

## 2018-11-10 DIAGNOSIS — G4733 Obstructive sleep apnea (adult) (pediatric): Secondary | ICD-10-CM

## 2018-11-10 DIAGNOSIS — E781 Pure hyperglyceridemia: Secondary | ICD-10-CM | POA: Diagnosis not present

## 2018-11-10 DIAGNOSIS — J4530 Mild persistent asthma, uncomplicated: Secondary | ICD-10-CM | POA: Diagnosis not present

## 2018-11-10 DIAGNOSIS — G4730 Sleep apnea, unspecified: Secondary | ICD-10-CM | POA: Diagnosis not present

## 2018-11-10 DIAGNOSIS — I10 Essential (primary) hypertension: Secondary | ICD-10-CM | POA: Diagnosis not present

## 2018-11-17 DIAGNOSIS — B351 Tinea unguium: Secondary | ICD-10-CM | POA: Diagnosis not present

## 2018-11-17 DIAGNOSIS — M79674 Pain in right toe(s): Secondary | ICD-10-CM | POA: Diagnosis not present

## 2018-11-17 DIAGNOSIS — M79675 Pain in left toe(s): Secondary | ICD-10-CM | POA: Diagnosis not present

## 2018-11-17 DIAGNOSIS — I2510 Atherosclerotic heart disease of native coronary artery without angina pectoris: Secondary | ICD-10-CM | POA: Diagnosis not present

## 2018-11-17 DIAGNOSIS — R001 Bradycardia, unspecified: Secondary | ICD-10-CM | POA: Diagnosis not present

## 2018-11-18 DIAGNOSIS — I2510 Atherosclerotic heart disease of native coronary artery without angina pectoris: Secondary | ICD-10-CM | POA: Diagnosis not present

## 2018-11-18 DIAGNOSIS — E781 Pure hyperglyceridemia: Secondary | ICD-10-CM | POA: Diagnosis not present

## 2018-11-18 DIAGNOSIS — E1165 Type 2 diabetes mellitus with hyperglycemia: Secondary | ICD-10-CM | POA: Diagnosis not present

## 2018-11-18 DIAGNOSIS — G4730 Sleep apnea, unspecified: Secondary | ICD-10-CM | POA: Diagnosis not present

## 2018-11-18 DIAGNOSIS — I10 Essential (primary) hypertension: Secondary | ICD-10-CM | POA: Diagnosis not present

## 2018-11-22 DIAGNOSIS — I2510 Atherosclerotic heart disease of native coronary artery without angina pectoris: Secondary | ICD-10-CM | POA: Diagnosis not present

## 2018-11-22 DIAGNOSIS — E1165 Type 2 diabetes mellitus with hyperglycemia: Secondary | ICD-10-CM | POA: Diagnosis not present

## 2018-11-22 DIAGNOSIS — I10 Essential (primary) hypertension: Secondary | ICD-10-CM | POA: Diagnosis not present

## 2018-11-22 DIAGNOSIS — G4730 Sleep apnea, unspecified: Secondary | ICD-10-CM | POA: Diagnosis not present

## 2018-11-22 DIAGNOSIS — E781 Pure hyperglyceridemia: Secondary | ICD-10-CM | POA: Diagnosis not present

## 2018-11-23 DIAGNOSIS — I10 Essential (primary) hypertension: Secondary | ICD-10-CM | POA: Diagnosis not present

## 2018-11-23 DIAGNOSIS — I2510 Atherosclerotic heart disease of native coronary artery without angina pectoris: Secondary | ICD-10-CM | POA: Diagnosis not present

## 2018-11-23 DIAGNOSIS — E782 Mixed hyperlipidemia: Secondary | ICD-10-CM | POA: Diagnosis not present

## 2018-11-23 DIAGNOSIS — I25118 Atherosclerotic heart disease of native coronary artery with other forms of angina pectoris: Secondary | ICD-10-CM | POA: Diagnosis not present

## 2018-11-23 DIAGNOSIS — G4733 Obstructive sleep apnea (adult) (pediatric): Secondary | ICD-10-CM | POA: Diagnosis not present

## 2018-11-26 ENCOUNTER — Other Ambulatory Visit: Payer: Self-pay

## 2018-11-26 DIAGNOSIS — J4520 Mild intermittent asthma, uncomplicated: Secondary | ICD-10-CM

## 2018-11-26 MED ORDER — FUROSEMIDE 40 MG PO TABS: 20.0000 mg | ORAL_TABLET | Freq: Every day | ORAL | 0 refills | Status: DC

## 2018-11-26 MED ORDER — IPRATROPIUM-ALBUTEROL 0.5-2.5 (3) MG/3ML IN SOLN: 3.0000 mL | Freq: Four times a day (QID) | RESPIRATORY_TRACT | 3 refills | Status: DC | PRN

## 2018-11-28 NOTE — Procedures (Signed)
[facility] [address] [place], [place] [address]  Patient Name: Daniel Hodges DOB: Aug 26, 1941   SLEEP STUDY INTERPRETATION  DATE OF SERVICE: November 10, 2018   SLEEP STUDY HISTORY: This patient is referred to the sleep lab for a baseline Polysomnography. Pertinent history includes a history of diagnosis of excessive daytime somnolence and snoring.  PROCEDURE: This overnight polysomnogram was performed using the Alice 5 acquisition system using the standard diagnostic protocol as outlined by the AASM. This includes 6 channels of EEG, 2 channelscannels of EOG, chin EMG, bilateral anterior tibialis EMG, nasal/oral thermister, PTAF, chest and abdominal wall movements, ECG and pulse oximetry. Apneas and Hypopneas were scored per AASM definition.  SLEEP ARCHITECHTURE: This is a baseline polysomnograph  study. The total recording time was 458.2 minutes and the patients total sleep time is noted to be 352.5 minutes. Sleep onset latency was 42.0 minutes and is prolonged.  Stage R sleep onset latency was 66 minutes. Sleep maintenance efficiency was 77.3 % and is reduced.  Sleep staging expressed as a percentage of total sleep time demonstrated 15.9 % N1, 37.2 % N2 and 22.8 % N3  sleep. Stage R represents 24.9 % of total sleep time. This is within normal limits.  There were a total of 40 arousals  for an overall arousal index of 6.8 per hour of sleep. PLMS arousal were noted. Arousals without respiratory events are  noted. This can contribute to sleep architechture disruption.  RESPIRATORY MONITORING:   Patient exhibits evidence of sleep disorderd breathing characterized by 5 central apneas, 0 obstructive apneas and 0 mixed apneas. There were 27 obstructive hypopneas and 1 RERAs. Most of the apneas/hypopneas were of obstructive variety. The total apnea hypopnea index (apneas and hypopneas per hour of sleep) is 5.4 respiratory events per hour and is mild.  Respiratory monitoring  demonstrated severe snoring through the night. There are a total of 829 snoring episodes representing 38 % of sleep.   Baseline oxygen saturation during wakefulness was 95 % and during NREM sleep averaged 94 % through the night. Arterial saturation during REM sleep was 95 % through the night. There was significant  oxygen desaturation with the respiratory events. Arterial oxygen desaturation occurred of at least 4% was noted with a low saturation of 80.  %. The study was performed off oxygen.  CARDIAC MONITORING:   Average heart rate is 78 during sleep with a high of 96 beats per minute. Malignant arrhythmias were not noted.    IMPRESSIONS:  --This overnight polysomnogram demonstrates presence of mild obstructive sleep apnea with an overall AHI 5.4 per hour. --The overall AHI was no worse  during Stage R. --There were associated some significant arterial oxygen desaturations noted with a lowest saturation of 83% --There was no significant PLMS noted in this study. --There is severe snoring noted throughout the study.    RECOMMENDATIONS:  --CPAP titration study is indicated in this case to determine optimal response therapy. --Nasal decongestants and antihistamines may be of help for increased upper airways resistance when present. --Weight loss through dietary and lifestyle modification is recommended in the presence of obesity. --A search for and treatment of any underlying cardiopulmonary disease is      recommended in the presence of oxygen desaturations. --Alternative treatment options if the patient is not willing to use CPAP include oral   appliances as well as surgical intervention which may help in the appropriate patient. --Clinical correlation is recommended. Please feel free to call the office for  any further  questions or assistance in the care of this patient.     Allyne Gee, MD [facility] Critical Care Medicine Sleep medicine

## 2018-11-30 ENCOUNTER — Ambulatory Visit: Payer: Medicare Other | Admitting: Internal Medicine

## 2018-11-30 ENCOUNTER — Other Ambulatory Visit: Payer: Self-pay | Admitting: Adult Health

## 2018-11-30 ENCOUNTER — Encounter: Payer: Self-pay | Admitting: Internal Medicine

## 2018-11-30 VITALS — BP 128/72 | HR 75 | Resp 16 | Ht 66.0 in | Wt 162.0 lb

## 2018-11-30 DIAGNOSIS — R0602 Shortness of breath: Secondary | ICD-10-CM | POA: Diagnosis not present

## 2018-11-30 DIAGNOSIS — G4733 Obstructive sleep apnea (adult) (pediatric): Secondary | ICD-10-CM

## 2018-11-30 DIAGNOSIS — Z889 Allergy status to unspecified drugs, medicaments and biological substances status: Secondary | ICD-10-CM | POA: Diagnosis not present

## 2018-11-30 DIAGNOSIS — J4520 Mild intermittent asthma, uncomplicated: Secondary | ICD-10-CM

## 2018-11-30 MED ORDER — DULAGLUTIDE 1.5 MG/0.5ML ~~LOC~~ SOAJ: 1.5000 mg | SUBCUTANEOUS | 1 refills | Status: DC

## 2018-11-30 MED ORDER — MOMETASONE FUROATE 50 MCG/ACT NA SUSP: 2.0000 | Freq: Every day | NASAL | 11 refills | Status: DC

## 2018-11-30 NOTE — Progress Notes (Signed)
[facility] [place], [place] [address]  Pulmonary Sleep Medicine   Office Visit Note  Patient Name: Daniel Hodges DOB: [birth year] MRN [id number]  Date of Service: 11/30/2018  Complaints/HPI: Pt is here for follow up on Sleep study.  His sleep study shows a AHI of 5.4.  He went to the cardiologist recently and was complaining about puffiness and swelling below his eyes.  He was told his mask may need adjusting.  Will get a titration study to assess for current pressure needs.   ROS  General: (-) fever, (-) chills, (-) night sweats, (-) weakness Skin: (-) rashes, (-) itching,. Eyes: (-) visual changes, (-) redness, (-) itching. Nose and Sinuses: (-) nasal stuffiness or itchiness, (-) postnasal drip, (-) nosebleeds, (-) sinus trouble. Mouth and Throat: (-) sore throat, (-) hoarseness. Neck: (-) swollen glands, (-) enlarged thyroid, (-) neck pain. Respiratory: - cough, (-) bloody sputum, - shortness of breath, - wheezing. Cardiovascular: - ankle swelling, (-) chest pain. Lymphatic: (-) lymph node enlargement. Neurologic: (-) numbness, (-) tingling. Psychiatric: (-) anxiety, (-) depression   Current Medication: Outpatient Encounter Medications as of 11/30/2018  Medication Sig  . [DISCONTINUED] mometasone (NASONEX) 50 MCG/ACT nasal spray Place 2 sprays into the nose daily.  Marland Kitchen albuterol (PROVENTIL HFA;VENTOLIN HFA) 108 (90 Base) MCG/ACT inhaler Inhale 2 puffs into the lungs every 6 (six) hours as needed for wheezing or shortness of breath.  Ilean Skill Lipoic Acid-Fenugreek-Cr (ALPHA LIP AC-FENUGREEK-CR ER PO) Take by mouth.  Marland Kitchen aspirin 81 MG tablet Take 81 mg by mouth daily.  Marland Kitchen atorvastatin (LIPITOR) 20 MG tablet Take 1 tablet (20 mg total) by mouth at bedtime.  . benzonatate (TESSALON) 200 MG capsule Take 1 capsule (200 mg total) by mouth 2 (two) times daily as needed for cough.  . Continuous Blood Gluc Sensor (FREESTYLE LIBRE SENSOR SYSTEM) MISC 14 day  monitoring system. Use as directed.  E11.65  . docusate sodium (COLACE) 100 MG capsule Take 1 capsule (100 mg total) by mouth 2 (two) times daily as needed for mild constipation.  . ferrous sulfate 325 (65 FE) MG tablet Take 325 mg by mouth 2 (two) times daily with a meal.  . furosemide (LASIX) 40 MG tablet Take 40 mg by mouth.  . furosemide (LASIX) 40 MG tablet Take 0.5 tablets (20 mg total) by mouth daily.  Marland Kitchen gabapentin (NEURONTIN) 300 MG capsule TAKE 1 CAPSULE BY MOUTH DAILY AND 1 CAPSULE NIGHTLY  . glipiZIDE (GLUCOTROL) 10 MG tablet Take 1 tablet (10 mg total) by mouth 2 (two) times daily with a meal. (Patient not taking: Reported on 11/30/2018)  . insulin aspart protamine - aspart (NOVOLOG MIX 70/30 FLEXPEN) (70-30) 100 UNIT/ML FlexPen Inject into the skin.  . Insulin Glargine, 2 Unit Dial, 300 UNIT/ML SOPN Inject into the skin.  Marland Kitchen ipratropium-albuterol (DUONEB) 0.5-2.5 (3) MG/3ML SOLN Take 3 mLs by nebulization every 6 (six) hours as needed.  . loratadine (CLARITIN) 10 MG tablet Take 1 tablet (10 mg total) by mouth daily.  . metFORMIN (GLUMETZA) 1000 MG (MOD) 24 hr tablet Take 1 tablet (1,000 mg total) by mouth daily with breakfast.  . montelukast (SINGULAIR) 10 MG tablet Take 1 tablet (10 mg total) by mouth daily.  . Multiple Vitamin (MULTIVITAMIN WITH MINERALS) TABS tablet Take 1 tablet by mouth daily.  Marland Kitchen neomycin-polymyxin-hydrocortisone (CORTISPORIN) 3.5-10000-1 ophthalmic suspension Use 4 drops in both ears BID for 7 days as needed  . ONE TOUCH ULTRA TEST test strip USE THREE TIMES  DAILY  . polyethylene glycol (MIRALAX / GLYCOLAX) packet Take 17 g by mouth daily.  Marland Kitchen senna (SENOKOT) 8.6 MG tablet Take 1 tablet by mouth daily.   No facility-administered encounter medications on file as of 11/30/2018.     Surgical History: Past Surgical History:  Procedure Laterality Date  . APPENDECTOMY    . COLONOSCOPY WITH PROPOFOL N/A 06/11/2015   Procedure: COLONOSCOPY WITH PROPOFOL;  Surgeon:  Manya Silvas, MD;  Location: [facility] ENDOSCOPY;  Service: Endoscopy;  Laterality: N/A;  . CORONARY ARTERY BYPASS GRAFT    . HERNIA REPAIR    . TEE WITHOUT CARDIOVERSION    . TRACHEOSTOMY    . VASCULAR SURGERY      Medical History: Past Medical History:  Diagnosis Date  . Anginal pain ([place])   . Asthma   . Coronary artery disease   . Diabetes mellitus without complication ([place])   . Hyperlipidemia   . Hypertension   . Sleep apnea     Family History: Family History  Problem Relation Age of Onset  . Cancer Sister     Social History: Social History   Socioeconomic History  . Marital status: Married    Spouse name: Not on file  . Number of children: Not on file  . Years of education: Not on file  . Highest education level: Not on file  Occupational History  . Not on file  Social Needs  . Financial resource strain: Not on file  . Food insecurity:    Worry: Not on file    Inability: Not on file  . Transportation needs:    Medical: Not on file    Non-medical: Not on file  Tobacco Use  . Smoking status: Never Smoker  . Smokeless tobacco: Never Used  Substance and Sexual Activity  . Alcohol use: No  . Drug use: No  . Sexual activity: Not on file  Lifestyle  . Physical activity:    Days per week: Not on file    Minutes per session: Not on file  . Stress: Not on file  Relationships  . Social connections:    Talks on phone: Not on file    Gets together: Not on file    Attends religious service: Not on file    Active member of club or organization: Not on file    Attends meetings of clubs or organizations: Not on file    Relationship status: Not on file  . Intimate partner violence:    Fear of current or ex partner: Not on file    Emotionally abused: Not on file    Physically abused: Not on file    Forced sexual activity: Not on file  Other Topics Concern  . Not on file  Social History Narrative  . Not on file    Vital Signs: Blood pressure 128/72, pulse  75, resp. rate 16, height 5\' 6"  (1.676 m), weight 162 lb (73.5 kg), SpO2 92 %.  Examination: General Appearance: The patient is well-developed, well-nourished, and in no distress. Skin: Gross inspection of skin unremarkable. Head: normocephalic, no gross deformities. Eyes: no gross deformities noted. ENT: ears appear grossly normal no exudates. Neck: Supple. No thyromegaly. No LAD. Respiratory: clear bilateraly. Cardiovascular: Normal S1 and S2 without murmur or rub. Extremities: No cyanosis. pulses are equal. Neurologic: Alert and oriented. No involuntary movements.  LABS: Recent Results (from the past 2160 hour(s))  POCT glucose (manual entry)     Status: Abnormal   Collection Time: 11/03/18  3:31  PM  Result Value Ref Range   POC Glucose 281 (A) 70 - 99 mg/dl    Radiology: Dg Abd 1 View  Result Date: 12/21/2017 CLINICAL DATA:  Lower abdominal pain with constipation EXAM: ABDOMEN - 1 VIEW COMPARISON:  CT abdomen and pelvis December 20, 2017 FINDINGS: There remain loops of mildly dilated bowel without air-fluid levels. No free air. There is moderate stool in the colon. IMPRESSION: Bowel gas pattern suggests enteritis or ileus. A degree of small bowel obstruction cannot be entirely excluded. No free air. Appearance appears similar to CT 1 day prior. Electronically Signed   By: Lowella Grip III M.D.   On: 12/21/2017 14:06   Dg Chest Port 1 View  Result Date: 12/20/2017 CLINICAL DATA:  Testing diffuse abdominal pain onset today. EXAM: PORTABLE CHEST 1 VIEW COMPARISON:  08/26/2016 FINDINGS: Prior median sternotomy and CABG. Low lung volumes. Mild cardiomegaly. No edema. Mild atelectasis or scarring at both lung bases. IMPRESSION: 1. No acute thoracic findings. 2. Mild atelectasis or scarring at the lung bases. 3. Cardiomegaly.  Prior CABG. Electronically Signed   By: Van Clines M.D.   On: 12/20/2017 15:59   Ct Renal Stone Study  Result Date: 12/20/2017 CLINICAL DATA:  Abdominal  pain onset at 1 p.m. today. Pallor and diaphoresis. EXAM: CT ABDOMEN AND PELVIS WITHOUT CONTRAST TECHNIQUE: Multidetector CT imaging of the abdomen and pelvis was performed following the standard protocol without IV contrast. COMPARISON:  Overlapping portions of CT chest from 01/07/2008 FINDINGS: Lower chest: Scarring in the right middle lobe and lingula. Coronary atherosclerotic calcification. Small type 1 hiatal hernia. Hepatobiliary: Unremarkable Pancreas: Scattered punctate calcifications in the pancreatic parenchyma compatible with chronic calcific pancreatitis. Spleen: Unremarkable Adrenals/Urinary Tract: Unremarkable Stomach/Bowel: Postoperative findings in the right colon. Multiple dilated loops of proximal small bowel are present some containing air-fluid levels at differing vertical levels. These lead to mid abdominal loops of small bowel which are mildly dilated and mildly thick-walled. There is a gradual transition to nondilated ileum. A specific cause for obstruction is not identified. No abscess or extraluminal gas. No portal venous gas. Vascular/Lymphatic: Aortoiliac atherosclerotic vascular disease. No appreciable adenopathy. Reproductive: The prostate gland measures 6.2 by 4.2 by 5.3 cm (volume = 72 cm^3). Other: No supplemental non-categorized findings. Musculoskeletal: Median sternotomy. Indirect left inguinal hernia containing adipose tissues. No appreciable herniated bowel. IMPRESSION: 1. Multiple loops of dilated small proximal small bowel, with a gradual transition to normal caliber in an area where there is some mild bowel wall thickening but no obvious cause for obstruction/transition. Very subtle edema in the mesentery along the involved loops. There are some postoperative findings in the right colon but the intercalary anastomosis does not appear to be a site of obstruction. The appearance could be due to enteritis causing local ileus. 2. Other imaging findings of potential clinical  significance: Aortoiliac atherosclerotic vascular disease. Coronary atherosclerosis. Small type 1 hiatal hernia. Chronic calcific pancreatitis. Prostatomegaly. Indirect left inguinal hernia contains adipose tissues (no herniated bowel). Electronically Signed   By: Van Clines M.D.   On: 12/20/2017 15:58    No results found.  No results found.    Assessment and Plan: Patient Active Problem List   Diagnosis Date Noted  . Pleural effusion 08/06/2018  . Encounter for general adult medical examination with abnormal findings 03/20/2018  . Chronic otitis externa of both ears 03/20/2018  . Uncontrolled type 2 diabetes mellitus with hypoglycemia ([place]) 03/20/2018  . Dysuria 03/20/2018  . Shortness of breath 01/22/2018  .  Cough 01/14/2018  . Constipation 12/30/2017  . Non-seasonal allergic rhinitis due to pollen 12/30/2017  . Dehydration 12/30/2017  . Essential hypertension 12/29/2017  . Uncontrolled type 2 diabetes mellitus with hyperglycemia ([place]) 12/27/2017  . Acute upper respiratory infection 12/27/2017  . Need for vaccination against Streptococcus pneumoniae using pneumococcal conjugate vaccine 13 12/27/2017  . Coronary artery disease involving native heart without angina pectoris 12/27/2017  . Mixed hyperlipidemia 12/27/2017  . AKI (acute kidney injury) ([place]) 12/20/2017  . OSA on CPAP 02/01/2016  . Aortic ejection murmur 08/14/2015  . Tracheostomy in place [facility]) 09/22/2013  . Ileus ([place]) 09/20/2013  . Anemia 09/19/2013  . Hypercarbia 09/19/2013  . Postoperative anemia due to acute blood loss 09/06/2013  . Thrombocytopenia ([place]) 09/06/2013  . 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 Abnormal stress ECG 09/01/2013  . Asthma 09/01/2013  . S/P appendectomy 09/01/2013    1. OSA (obstructive sleep apnea) cpap titration study ordered for patient to assess pressure needs for new machine.  - Cpap titration; Future  2. SOB (shortness of breath) FVC is 1.7 which is  50% of the pre-predicted value FEV1 is 1.5 which is 56% the pre-predicted value FEV1/FVC is 84% which is 114% of the predicted value on today spirometry. - Spirometry with Graph  3. H/O seasonal allergies Stable, continue current treatment.  4. Mild intermittent asthma in adult without complication Stable, continue present management.   General Counseling: I have discussed the findings of the evaluation and examination with Daniel Hodges.  I have also discussed any further diagnostic evaluation thatmay be needed or ordered today. Daniel Hodges verbalizes understanding of the findings of todays visit. We also reviewed his medications today and discussed drug interactions and side effects including but not limited excessive drowsiness and altered mental states. We also discussed that there is always a risk not just to him but also people around him. he has been encouraged to call the office with any questions or concerns that should arise related to todays visit.    Time spent:  40 This patient was seen by Orson Gear AGNP-C in Collaboration with Dr. Devona Konig as a part of collaborative care agreement.   I have personally obtained a history, examined the patient, evaluated laboratory and imaging results, formulated the assessment and plan and placed orders.    Allyne Gee, MD [facility] Pulmonary and Critical Care Sleep medicine

## 2018-11-30 NOTE — Progress Notes (Signed)
Pts trulicty 90 day supply sent to [facility].

## 2018-12-01 DIAGNOSIS — I10 Essential (primary) hypertension: Secondary | ICD-10-CM | POA: Diagnosis not present

## 2018-12-01 DIAGNOSIS — I2510 Atherosclerotic heart disease of native coronary artery without angina pectoris: Secondary | ICD-10-CM | POA: Diagnosis not present

## 2018-12-01 DIAGNOSIS — E781 Pure hyperglyceridemia: Secondary | ICD-10-CM | POA: Diagnosis not present

## 2018-12-01 DIAGNOSIS — J4530 Mild persistent asthma, uncomplicated: Secondary | ICD-10-CM | POA: Diagnosis not present

## 2018-12-01 DIAGNOSIS — E1165 Type 2 diabetes mellitus with hyperglycemia: Secondary | ICD-10-CM | POA: Diagnosis not present

## 2018-12-02 ENCOUNTER — Other Ambulatory Visit: Payer: Self-pay | Admitting: Adult Health

## 2018-12-02 ENCOUNTER — Other Ambulatory Visit: Payer: Self-pay

## 2018-12-02 DIAGNOSIS — J4520 Mild intermittent asthma, uncomplicated: Secondary | ICD-10-CM

## 2018-12-02 MED ORDER — GABAPENTIN 300 MG PO CAPS: ORAL_CAPSULE | ORAL | 0 refills | Status: DC

## 2018-12-02 MED ORDER — FUROSEMIDE 40 MG PO TABS: 40.0000 mg | ORAL_TABLET | Freq: Every day | ORAL | 1 refills | Status: DC

## 2018-12-02 MED ORDER — IPRATROPIUM-ALBUTEROL 0.5-2.5 (3) MG/3ML IN SOLN: 3.0000 mL | Freq: Four times a day (QID) | RESPIRATORY_TRACT | 1 refills | Status: DC | PRN

## 2018-12-02 MED ORDER — ATORVASTATIN CALCIUM 20 MG PO TABS: 20.0000 mg | ORAL_TABLET | Freq: Every day | ORAL | 1 refills | Status: DC

## 2018-12-06 ENCOUNTER — Other Ambulatory Visit: Payer: Self-pay

## 2018-12-06 DIAGNOSIS — J301 Allergic rhinitis due to pollen: Secondary | ICD-10-CM

## 2018-12-06 MED ORDER — MONTELUKAST SODIUM 10 MG PO TABS: 10.0000 mg | ORAL_TABLET | Freq: Every day | ORAL | 5 refills | Status: DC

## 2018-12-08 ENCOUNTER — Telehealth: Payer: Self-pay

## 2018-12-08 ENCOUNTER — Other Ambulatory Visit: Payer: Self-pay

## 2018-12-08 DIAGNOSIS — I10 Essential (primary) hypertension: Secondary | ICD-10-CM | POA: Diagnosis not present

## 2018-12-08 DIAGNOSIS — E781 Pure hyperglyceridemia: Secondary | ICD-10-CM | POA: Diagnosis not present

## 2018-12-08 DIAGNOSIS — E1165 Type 2 diabetes mellitus with hyperglycemia: Secondary | ICD-10-CM | POA: Diagnosis not present

## 2018-12-08 DIAGNOSIS — G4730 Sleep apnea, unspecified: Secondary | ICD-10-CM | POA: Diagnosis not present

## 2018-12-08 DIAGNOSIS — J301 Allergic rhinitis due to pollen: Secondary | ICD-10-CM

## 2018-12-08 MED ORDER — MONTELUKAST SODIUM 10 MG PO TABS: 10.0000 mg | ORAL_TABLET | Freq: Every day | ORAL | 5 refills | Status: DC

## 2018-12-08 NOTE — Telephone Encounter (Signed)
Left message and asked pt if he wanted to do his sleep study tonight due to cancellation on sleep schedule. Daniel Hodges

## 2018-12-15 ENCOUNTER — Encounter (INDEPENDENT_AMBULATORY_CARE_PROVIDER_SITE_OTHER): Payer: Medicare Other | Admitting: Internal Medicine

## 2018-12-15 DIAGNOSIS — G4733 Obstructive sleep apnea (adult) (pediatric): Secondary | ICD-10-CM | POA: Diagnosis not present

## 2018-12-21 ENCOUNTER — Other Ambulatory Visit: Payer: Self-pay

## 2018-12-21 ENCOUNTER — Encounter: Payer: Medicare Other | Attending: Endocrinology | Admitting: *Deleted

## 2018-12-21 ENCOUNTER — Encounter: Payer: Self-pay | Admitting: *Deleted

## 2018-12-21 VITALS — BP 110/66 | Ht 66.0 in | Wt 167.2 lb

## 2018-12-21 DIAGNOSIS — Z713 Dietary counseling and surveillance: Secondary | ICD-10-CM | POA: Diagnosis not present

## 2018-12-21 DIAGNOSIS — E119 Type 2 diabetes mellitus without complications: Secondary | ICD-10-CM | POA: Insufficient documentation

## 2018-12-21 DIAGNOSIS — Z6826 Body mass index (BMI) 26.0-26.9, adult: Secondary | ICD-10-CM | POA: Insufficient documentation

## 2018-12-21 DIAGNOSIS — E1159 Type 2 diabetes mellitus with other circulatory complications: Secondary | ICD-10-CM

## 2018-12-21 NOTE — Patient Instructions (Signed)
Check blood sugars 3-4 x day before each meal and before bed every day Bring blood sugar records to the next MD appointment  Exercise: Continue walking for  30  minutes   7  days a week  Eat 3 meals day,  1-2 snacks a day Space meals 4-6 hours apart  Carry fast acting glucose and a snack at all times Rotate injection sites Don't rub injection sites   Call if you want to return to see the nurse or the dietitian

## 2018-12-21 NOTE — Progress Notes (Signed)
Diabetes Self-Management Education  Visit Type: First/Initial  Appt. Start Time: 0925 Appt. End Time:  1110  12/21/2018  [name], identified by name and date of birth, is a 78 y.o. male with a diagnosis of Diabetes: Type 2.   ASSESSMENT  Blood pressure 110/66, height 5\' 6"  (1.676 m), weight 167 lb 3.2 oz (75.8 kg). Body mass index is 26.99 kg/m.  Diabetes Self-Management Education - 12/21/18 1141      Visit Information   Visit Type  First/Initial      Initial Visit   Diabetes Type  Type 2    Are you currently following a meal plan?  No    Are you taking your medications as prescribed?  Yes    Date Diagnosed  5 years ago or longer      Health Coping   How would you rate your overall health?  Fair      Psychosocial Assessment   Patient Belief/Attitude about Diabetes  Motivated to manage diabetes    Self-care barriers  None    Self-management support  Doctor's office;Family    Other persons present  Spouse/SO    Patient Concerns  Nutrition/Meal planning;Glycemic Control;Monitoring    Special Needs  None    Preferred Learning Style  Auditory    Learning Readiness  Contemplating    How often do you need to have someone help you when you read instructions, pamphlets, or other written materials from your doctor or pharmacy?  3 - Sometimes    What is the last grade level you completed in school?  10th grade      Pre-Education Assessment   Patient understands the diabetes disease and treatment process.  Needs Review    Patient understands incorporating nutritional management into lifestyle.  Needs Instruction    Patient undertands incorporating physical activity into lifestyle.  Demonstrates understanding / competency    Patient understands using medications safely.  Needs Review    Patient understands monitoring blood glucose, interpreting and using results  Needs Review    Patient understands prevention, detection, and treatment of acute complications.  Needs  Review    Patient understands prevention, detection, and treatment of chronic complications.  Needs Review    Patient understands how to develop strategies to address psychosocial issues.  Needs Review    Patient understands how to develop strategies to promote health/change behavior.  Needs Review      Complications   Last HgB A1C per patient/outside source  9.7 %   11/10/2018   How often do you check your blood sugar?  3-4 times/day    Fasting Blood glucose range (mg/dL)  70-129   Wife reports FBG's 105-124 mg/dL   Postprandial Blood glucose range (mg/dL)  --   Wife reports pre-lunch 150-200 mg/dL and pre-supper 91-105 mg/dL.    Number of hypoglycemic episodes per month  1   in the last 2 weeks around 12-1 am   Can you tell when your blood sugar is low?  Yes    What do you do if your blood sugar is low?  wife gives him food    Have you had a dilated eye exam in the past 12 months?  Yes    Have you had a dental exam in the past 12 months?  Yes    Are you checking your feet?  Yes    How many days per week are you checking your feet?  2      Dietary Intake   Breakfast  oatmeal, bananas, blueberries with nuts; eggs, [place] pancake    Snack (morning)  nuts    Lunch  chapati, beans, quinoa, curry    Snack (afternoon)  nuts    Dinner  chapati, millet, chick peas, fruit, rice, beans, eggplant, broccoli, cauliflower, peas, green beans, zucchini, okra    Beverage(s)  water, unsweetened tea, coffee      Exercise   Exercise Type  Light (walking / raking leaves)    How many days per week to you exercise?  7    How many minutes per day do you exercise?  30    Total minutes per week of exercise  210      Patient Education   Previous Diabetes Education  No    Disease state   Explored patient's options for treatment of their diabetes    Nutrition management   Role of diet in the treatment of diabetes and the relationship between the three main macronutrients and blood glucose level;Food label  reading, portion sizes and measuring food.;Reviewed blood glucose goals for pre and post meals and how to evaluate the patients' food intake on their blood glucose level.;Meal timing in regards to the patients' current diabetes medication.    Physical activity and exercise   Role of exercise on diabetes management, blood pressure control and cardiac health.    Medications  Taught/reviewed insulin injection, site rotation, insulin storage and needle disposal.;Reviewed patients medication for diabetes, action, purpose, timing of dose and side effects.    Monitoring  Purpose and frequency of SMBG.;Taught/discussed recording of test results and interpretation of SMBG.;Identified appropriate SMBG and/or A1C goals.    Acute complications  Taught treatment of hypoglycemia - the 15 rule.    Chronic complications  Relationship between chronic complications and blood glucose control    Psychosocial adjustment  Identified and addressed patients feelings and concerns about diabetes      Individualized Goals (developed by patient)   Reducing Risk Improve blood sugars Prevent diabetes complications     Outcomes   Expected Outcomes  Demonstrated interest in learning. Expect positive outcomes    Future DMSE  PRN    Program Status  Not Completed       Individualized Plan for Diabetes Self-Management Training:   Learning Objective:  Patient will have a greater understanding of diabetes self-management. Patient education plan is to attend individual and/or group sessions per assessed needs and concerns.   Plan:   Patient Instructions  Check blood sugars 3-4 x day before each meal and before bed every day Bring blood sugar records to the next MD appointment Exercise: Continue walking for  30  minutes   7  days a week Eat 3 meals day,  1-2 snacks a day Space meals 4-6 hours apart Carry fast acting glucose and a snack at all times Rotate injection sites Don't rub injection sites  Call if you want to  return to see the nurse or the dietitian  Expected Outcomes:  Demonstrated interest in learning. Expect positive outcomes  Education material provided:  General Meal Planning Guidelines Simple Meal Plan [employer] Exchange Lists for Indians with Diabetes Glucose tablets Symptoms, causes and treatments of Hypoglycemia  If problems or questions, patient to contact team via:   Johny Drilling, [place], CCM, CDE [phone]  Future DSME appointment: PRN  No follow up scheduled at this time

## 2018-12-21 NOTE — Procedures (Unsigned)
[facility] [address] [place], [place] [address]  Patient Name: Daniel Hodges DOB: Nov 21, 1940   SLEEP STUDY INTERPRETATION  DATE OF SERVICE: December 15, 2018   SLEEP STUDY HISTORY: This patient is referred to the sleep lab for a baseline Polysomnography. Pertinent history includes a history of diagnosis of excessive daytime somnolence and snoring.  PROCEDURE: This overnight polysomnogram was performed using the Alice 5 acquisition system using the standard diagnostic protocol as outlined by the AASM. This includes 6 channels of EEG, 2 channelscannels of EOG, chin EMG, bilateral anterior tibialis EMG, nasal/oral thermister, PTAF, chest and abdominal wall movements, ECG and pulse oximetry. Apneas and Hypopneas were scored per AASM definition.  SLEEP ARCHITECHTURE: This is a baseline polysomnograph  study. The total recording time was 436.9 minutes and the patients total sleep time is noted to be 288.5 minutes. Sleep onset latency was 68.9 minutes and is prolonged.  Stage R sleep onset latency was 189.5 minutes. Sleep maintenance efficiency was 66.5 % and is decreased.  Sleep staging expressed as a percentage of total sleep time demonstrated 22.5 % N1, 41.6 % N2 and 25.5 % N3  sleep. Stage R represents 10.4 % of total sleep time. This is decreased.  There were a total of 25 arousals  for an overall arousal index of 5.2 per hour of sleep. PLMS arousal were noted. Arousals without respiratory events are  noted. This can contribute to sleep architechture disruption.  RESPIRATORY MONITORING:   Patient exhibits significant evidence of sleep disorderd breathing characterized by 13 central apneas, 0 obstructive apneas and 3 mixed apneas. There were 0 obstructive hypopneas and 0 RERAs. Most of the apneas/hypopneas were of central and mixed variety. The total apnea hypopnea index (apneas and hypopneas per hour of sleep) is 3.3 respiratory events per hour and is within normal  limits.  Respiratory monitoring demonstrated severe snoring through the night. There are a total of 1318 snoring episodes representing 43.5 % of sleep.   Baseline oxygen saturation during wakefulness was 98 % and during NREM sleep averaged 97 % through the night. Arterial saturation during REM sleep was 97% through the night. There was some significant  oxygen desaturation with the respiratory events. Arterial oxygen desaturation occurred of at least 4% was noted with a low saturation of 94 %. The study was performed off oxygen.  CARDIAC MONITORING:   Average heart rate is 68 during sleep with a high of 90 beats per minute. Malignant arrhythmias were not noted.  CPAP titration: Patient was started on CPAP as per the lab protocol.  Starting pressure was 6 cm water pressure with a maximal pressure of 9 cm water pressure.  On a pressure of 9 cm water pressure patient slept for 190.7 minutes and stage R was noted.  There were no apneas no hypopneas for an apnea hypopnea index of 0/h and lowest saturation of 94%  IMPRESSIONS:  --This overnight polysomnogram demonstrates adequate control of sleep apnea with an overall AHI 3.3 per hour. --The overall AHI was no worse  during Stage R. --There were associated no significant arterial oxygen desaturations noted lowest saturation 94% --There was significant PLMS noted in this study. --There is severe snoring noted throughout the study.    RECOMMENDATIONS:  --CPAP titration study is adequate to control the patient's obstructive sleep apnea the optimal pressure is 9 cm water pressure. --Nasal decongestants and antihistamines may be of help for increased upper airways resistance when present. --Weight loss through dietary and lifestyle modification is  recommended in the presence of obesity. --A search for and treatment of any underlying cardiopulmonary disease is      recommended in the presence of oxygen desaturations. --Alternative treatment options  if the patient is not willing to use CPAP include oral   appliances as well as surgical intervention which may help in the appropriate patient. --Clinical correlation is recommended. Please feel free to call the office for any further  questions or assistance in the care of this patient.     Allyne Gee, MD [facility] Pulmonary Critical Care Medicine Sleep medicine

## 2018-12-28 ENCOUNTER — Ambulatory Visit: Payer: Medicare Other | Admitting: Internal Medicine

## 2018-12-28 ENCOUNTER — Other Ambulatory Visit: Payer: Self-pay

## 2018-12-28 ENCOUNTER — Encounter: Payer: Self-pay | Admitting: Internal Medicine

## 2018-12-28 VITALS — BP 145/65 | HR 89 | Resp 16 | Ht 66.0 in | Wt 168.0 lb

## 2018-12-28 DIAGNOSIS — I2510 Atherosclerotic heart disease of native coronary artery without angina pectoris: Secondary | ICD-10-CM

## 2018-12-28 DIAGNOSIS — R0602 Shortness of breath: Secondary | ICD-10-CM | POA: Diagnosis not present

## 2018-12-28 DIAGNOSIS — G4733 Obstructive sleep apnea (adult) (pediatric): Secondary | ICD-10-CM

## 2018-12-28 DIAGNOSIS — R011 Cardiac murmur, unspecified: Secondary | ICD-10-CM

## 2018-12-28 DIAGNOSIS — Z9989 Dependence on other enabling machines and devices: Secondary | ICD-10-CM | POA: Diagnosis not present

## 2018-12-28 DIAGNOSIS — I2583 Coronary atherosclerosis due to lipid rich plaque: Secondary | ICD-10-CM

## 2018-12-28 NOTE — Patient Instructions (Signed)
CPAP and BPAP Information  CPAP and BPAP are methods of helping a person breathe with the use of air pressure. CPAP stands for "continuous positive airway pressure." BPAP stands for "bi-level positive airway pressure." In both methods, air is blown through your nose or mouth and into your air passages to help you breathe well.  CPAP and BPAP use different amounts of pressure to blow air. With CPAP, the amount of pressure stays the same while you breathe in and out. With BPAP, the amount of pressure is increased when you breathe in (inhale) so that you can take larger breaths. Your health care provider will recommend whether CPAP or BPAP would be more helpful for you.  Why are CPAP and BPAP treatments used?  CPAP or BPAP can be helpful if you have:  · Sleep apnea.  · Chronic obstructive pulmonary disease (COPD).  · Heart failure.  · Medical conditions that weaken the muscles of the chest including muscular dystrophy, or neurological diseases such as amyotrophic lateral sclerosis (ALS).  · Other problems that cause breathing to be weak, abnormal, or difficult.  CPAP is most commonly used for obstructive sleep apnea (OSA) to keep the airways from collapsing when the muscles relax during sleep.  How is CPAP or BPAP administered?  Both CPAP and BPAP are provided by a small machine with a flexible plastic tube that attaches to a plastic mask. You wear the mask. Air is blown through the mask into your nose or mouth. The amount of pressure that is used to blow the air can be adjusted on the machine. Your health care provider will determine the pressure setting that should be used based on your individual needs.  When should CPAP or BPAP be used?  In most cases, the mask only needs to be worn during sleep. Generally, the mask needs to be worn throughout the night and during any daytime naps. People with certain medical conditions may also need to wear the mask at other times when they are awake. Follow instructions from your  health care provider about when to use the machine.  What are some tips for using the mask?    · Because the mask needs to be snug, some people feel trapped or closed-in (claustrophobic) when first using the mask. If you feel this way, you may need to get used to the mask. One way to do this is by holding the mask loosely over your nose or mouth and then gradually applying the mask more snugly. You can also gradually increase the amount of time that you use the mask.  · Masks are available in various types and sizes. Some fit over your mouth and nose while others fit over just your nose. If your mask does not fit well, talk with your health care provider about getting a different one.  · If you are using a mask that fits over your nose and you tend to breathe through your mouth, a chin strap may be applied to help keep your mouth closed.  · The CPAP and BPAP machines have alarms that may sound if the mask comes off or develops a leak.  · If you have trouble with the mask, it is very important that you talk with your health care provider about finding a way to make the mask easier to tolerate. Do not stop using the mask. Stopping the use of the mask could have a negative impact on your health.  What are some tips for using the machine?  ·   Place your CPAP or BPAP machine on a secure table or stand near an electrical outlet.  · Know where the on/off switch is located on the machine.  · Follow instructions from your health care provider about how to set the pressure on your machine and when you should use it.  · Do not eat or drink while the CPAP or BPAP machine is on. Food or fluids could get pushed into your lungs by the pressure of the CPAP or BPAP.  · Do not smoke. Tobacco smoke residue can damage the machine.  · For home use, CPAP and BPAP machines can be rented or purchased through home health care companies. Many different brands of machines are available. Renting a machine before purchasing may help you find out  which particular machine works well for you.  · Keep the CPAP or BPAP machine and attachments clean. Ask your health care provider for specific instructions.  Get help right away if:  · You have redness or open areas around your nose or mouth where the mask fits.  · You have trouble using the CPAP or BPAP machine.  · You cannot tolerate wearing the CPAP or BPAP mask.  · You have pain, discomfort, and bloating in your abdomen.  Summary  · CPAP and BPAP are methods of helping a person breathe with the use of air pressure.  · Both CPAP and BPAP are provided by a small machine with a flexible plastic tube that attaches to a plastic mask.  · If you have trouble with the mask, it is very important that you talk with your health care provider about finding a way to make the mask easier to tolerate.  This information is not intended to replace advice given to you by your health care provider. Make sure you discuss any questions you have with your health care provider.  Document Released: 05/30/2004 Document Revised: 05/04/2018 Document Reviewed: 07/21/2016  Elsevier Interactive Patient Education © 2019 [employer]vier Inc.

## 2018-12-28 NOTE — Progress Notes (Signed)
[facility] [place], [place] [address]  Pulmonary Sleep Medicine   Office Visit Note  Patient Name: Daniel Hodges DOB: [birth year] MRN [id number]  Date of Service: 12/28/2018  Complaints/HPI: Patient had a titration study done and this shows optimal pressure to be 9CWP.  He had a baseline sleep study that showed an apnea hypotony index of 5.4 this resolved with CPAP at a pressure of 9 cm water pressure.  He will need a new machine he would like to try to get locally. Also has some mask issues.  Patient states that the full facemask really suit him.  He went back to the other mask which he says is doing better.  Other concerns worries about a heart valve murmur and he does see cardiology for that.  I deferred his management of the heart murmur to his primary cardiologist.  ROS  General: (-) fever, (-) chills, (-) night sweats, (-) weakness Skin: (-) rashes, (-) itching,. Eyes: (-) visual changes, (-) redness, (-) itching. Nose and Sinuses: (-) nasal stuffiness or itchiness, (-) postnasal drip, (-) nosebleeds, (-) sinus trouble. Mouth and Throat: (-) sore throat, (-) hoarseness. Neck: (-) swollen glands, (-) enlarged thyroid, (-) neck pain. Respiratory: - cough, (-) bloody sputum, - shortness of breath, - wheezing. Cardiovascular: - ankle swelling, (-) chest pain. Lymphatic: (-) lymph node enlargement. Neurologic: (-) numbness, (-) tingling. Psychiatric: (-) anxiety, (-) depression   Current Medication: Outpatient Encounter Medications as of 12/28/2018  Medication Sig  . aspirin 81 MG tablet Take 81 mg by mouth daily.  Marland Kitchen atorvastatin (LIPITOR) 20 MG tablet Take 1 tablet (20 mg total) by mouth at bedtime.  . budesonide-formoterol (SYMBICORT) 160-4.5 MCG/ACT inhaler Inhale 2 puffs into the lungs daily.   . Dulaglutide (TRULICITY) 1.5 RW/4.3XV SOPN Inject 1.5 mg into the skin once a week.  . ferrous sulfate 325 (65 FE) MG tablet Take 325 mg by mouth 2 (two)  times daily with a meal.  . furosemide (LASIX) 40 MG tablet Take 0.5 tablets (20 mg total) by mouth daily.  Marland Kitchen gabapentin (NEURONTIN) 300 MG capsule TAKE 1 CAPSULE BY MOUTH DAILY AND 1 CAPSULE NIGHTLY (Patient taking differently: Take 300 mg by mouth at bedtime. TAKE 1 CAPSULE BY MOUTH DAILY AND 1 CAPSULE NIGHTLY)  . HUMALOG KWIKPEN 100 UNIT/ML KwikPen 6 units with breakfast, 8 units with lunch and 10 units with supper  . ipratropium-albuterol (DUONEB) 0.5-2.5 (3) MG/3ML SOLN USE 3 ML VIA NEBULIZER EVERY 6 HOURS AS NEEDED  . loratadine (CLARITIN) 10 MG tablet Take 1 tablet (10 mg total) by mouth daily.  . metFORMIN (GLUMETZA) 1000 MG (MOD) 24 hr tablet Take 1 tablet (1,000 mg total) by mouth daily with breakfast.  . mometasone (NASONEX) 50 MCG/ACT nasal spray Place 2 sprays into the nose daily.  . montelukast (SINGULAIR) 10 MG tablet Take 1 tablet (10 mg total) by mouth daily.  . Multiple Vitamin (MULTIVITAMIN WITH MINERALS) TABS tablet Take 1 tablet by mouth daily.  Marland Kitchen neomycin-polymyxin-hydrocortisone (CORTISPORIN) 3.5-10000-1 ophthalmic suspension Use 4 drops in both ears BID for 7 days as needed  . niacin 500 MG tablet Take 500 mg by mouth 2 (two) times daily with a meal.  . ONE TOUCH ULTRA TEST test strip USE THREE TIMES DAILY  . polyethylene glycol (MIRALAX / GLYCOLAX) packet Take 17 g by mouth daily.  Marland Kitchen senna (SENOKOT) 8.6 MG tablet Take 1 tablet by mouth daily.  . simethicone (MYLICON) 80 MG chewable tablet Chew 160 mg  by mouth 2 (two) times daily.   [name] SOLOSTAR 300 UNIT/ML SOPN Inject 25 Units into the skin daily.    No facility-administered encounter medications on file as of 12/28/2018.     Surgical History: Past Surgical History:  Procedure Laterality Date  . APPENDECTOMY    . COLONOSCOPY WITH PROPOFOL N/A 06/11/2015   Procedure: COLONOSCOPY WITH PROPOFOL;  Surgeon: Manya Silvas, MD;  Location: [facility] ENDOSCOPY;  Service: Endoscopy;  Laterality: N/A;  . CORONARY ARTERY  BYPASS GRAFT    . HERNIA REPAIR    . TEE WITHOUT CARDIOVERSION    . TRACHEOSTOMY    . VASCULAR SURGERY      Medical History: Past Medical History:  Diagnosis Date  . Anginal pain ([place])   . Asthma   . Coronary artery disease   . Diabetes mellitus without complication ([place])   . Hyperlipidemia   . Hypertension   . Sleep apnea     Family History: Family History  Problem Relation Age of Onset  . Cancer Sister   . Diabetes Daughter   . Diabetes Son     Social History: Social History   Socioeconomic History  . Marital status: Married    Spouse name: Not on file  . Number of children: Not on file  . Years of education: Not on file  . Highest education level: Not on file  Occupational History  . Not on file  Social Needs  . Financial resource strain: Not on file  . Food insecurity:    Worry: Not on file    Inability: Not on file  . Transportation needs:    Medical: Not on file    Non-medical: Not on file  Tobacco Use  . Smoking status: Never Smoker  . Smokeless tobacco: Never Used  Substance and Sexual Activity  . Alcohol use: No  . Drug use: No  . Sexual activity: Not on file  Lifestyle  . Physical activity:    Days per week: Not on file    Minutes per session: Not on file  . Stress: Not on file  Relationships  . Social connections:    Talks on phone: Not on file    Gets together: Not on file    Attends religious service: Not on file    Active member of club or organization: Not on file    Attends meetings of clubs or organizations: Not on file    Relationship status: Not on file  . Intimate partner violence:    Fear of current or ex partner: Not on file    Emotionally abused: Not on file    Physically abused: Not on file    Forced sexual activity: Not on file  Other Topics Concern  . Not on file  Social History Narrative  . Not on file    Vital Signs: Blood pressure (!) 145/65, pulse 89, resp. rate 16, height 5\' 6"  (1.676 m), weight 168 lb (76.2  kg), SpO2 100 %.  Examination: General Appearance: The patient is well-developed, well-nourished, and in no distress. Skin: Gross inspection of skin unremarkable. Head: normocephalic, no gross deformities. Eyes: no gross deformities noted. ENT: ears appear grossly normal no exudates. Neck: Supple. No thyromegaly. No LAD. Respiratory: no rhonchi noted at this time. Cardiovascular: Normal S1 and S2 without murmur or rub. Extremities: No cyanosis. pulses are equal. Neurologic: Alert and oriented. No involuntary movements.  LABS: Recent Results (from the past 2160 hour(s))  POCT glucose (manual entry)  Status: Abnormal   Collection Time: 11/03/18  3:31 PM  Result Value Ref Range   POC Glucose 281 (A) 70 - 99 mg/dl    Radiology: Dg Abd 1 View  Result Date: 12/21/2017 CLINICAL DATA:  Lower abdominal pain with constipation EXAM: ABDOMEN - 1 VIEW COMPARISON:  CT abdomen and pelvis December 20, 2017 FINDINGS: There remain loops of mildly dilated bowel without air-fluid levels. No free air. There is moderate stool in the colon. IMPRESSION: Bowel gas pattern suggests enteritis or ileus. A degree of small bowel obstruction cannot be entirely excluded. No free air. Appearance appears similar to CT 1 day prior. Electronically Signed   By: Lowella Grip III M.D.   On: 12/21/2017 14:06   Dg Chest Port 1 View  Result Date: 12/20/2017 CLINICAL DATA:  Testing diffuse abdominal pain onset today. EXAM: PORTABLE CHEST 1 VIEW COMPARISON:  08/26/2016 FINDINGS: Prior median sternotomy and CABG. Low lung volumes. Mild cardiomegaly. No edema. Mild atelectasis or scarring at both lung bases. IMPRESSION: 1. No acute thoracic findings. 2. Mild atelectasis or scarring at the lung bases. 3. Cardiomegaly.  Prior CABG. Electronically Signed   By: Van Clines M.D.   On: 12/20/2017 15:59   Ct Renal Stone Study  Result Date: 12/20/2017 CLINICAL DATA:  Abdominal pain onset at 1 p.m. today. Pallor and diaphoresis.  EXAM: CT ABDOMEN AND PELVIS WITHOUT CONTRAST TECHNIQUE: Multidetector CT imaging of the abdomen and pelvis was performed following the standard protocol without IV contrast. COMPARISON:  Overlapping portions of CT chest from 01/07/2008 FINDINGS: Lower chest: Scarring in the right middle lobe and lingula. Coronary atherosclerotic calcification. Small type 1 hiatal hernia. Hepatobiliary: Unremarkable Pancreas: Scattered punctate calcifications in the pancreatic parenchyma compatible with chronic calcific pancreatitis. Spleen: Unremarkable Adrenals/Urinary Tract: Unremarkable Stomach/Bowel: Postoperative findings in the right colon. Multiple dilated loops of proximal small bowel are present some containing air-fluid levels at differing vertical levels. These lead to mid abdominal loops of small bowel which are mildly dilated and mildly thick-walled. There is a gradual transition to nondilated ileum. A specific cause for obstruction is not identified. No abscess or extraluminal gas. No portal venous gas. Vascular/Lymphatic: Aortoiliac atherosclerotic vascular disease. No appreciable adenopathy. Reproductive: The prostate gland measures 6.2 by 4.2 by 5.3 cm (volume = 72 cm^3). Other: No supplemental non-categorized findings. Musculoskeletal: Median sternotomy. Indirect left inguinal hernia containing adipose tissues. No appreciable herniated bowel. IMPRESSION: 1. Multiple loops of dilated small proximal small bowel, with a gradual transition to normal caliber in an area where there is some mild bowel wall thickening but no obvious cause for obstruction/transition. Very subtle edema in the mesentery along the involved loops. There are some postoperative findings in the right colon but the intercalary anastomosis does not appear to be a site of obstruction. The appearance could be due to enteritis causing local ileus. 2. Other imaging findings of potential clinical significance: Aortoiliac atherosclerotic vascular disease.  Coronary atherosclerosis. Small type 1 hiatal hernia. Chronic calcific pancreatitis. Prostatomegaly. Indirect left inguinal hernia contains adipose tissues (no herniated bowel). Electronically Signed   By: Van Clines M.D.   On: 12/20/2017 15:58    No results found.  No results found.    Assessment and Plan: Patient Active Problem List   Diagnosis Date Noted  . Pleural effusion 08/06/2018  . Encounter for general adult medical examination with abnormal findings 03/20/2018  . Chronic otitis externa of both ears 03/20/2018  . Uncontrolled type 2 diabetes mellitus with hypoglycemia ([place]) 03/20/2018  . Dysuria  03/20/2018  . Shortness of breath 01/22/2018  . Cough 01/14/2018  . Constipation 12/30/2017  . Non-seasonal allergic rhinitis due to pollen 12/30/2017  . Dehydration 12/30/2017  . Essential hypertension 12/29/2017  . Uncontrolled type 2 diabetes mellitus with hyperglycemia ([place]) 12/27/2017  . Acute upper respiratory infection 12/27/2017  . Need for vaccination against Streptococcus pneumoniae using pneumococcal conjugate vaccine 13 12/27/2017  . Coronary artery disease involving native heart without angina pectoris 12/27/2017  . Mixed hyperlipidemia 12/27/2017  . AKI (acute kidney injury) ([place]) 12/20/2017  . OSA on CPAP 02/01/2016  . Aortic ejection murmur 08/14/2015  . Tracheostomy in place [facility]) 09/22/2013  . Ileus ([place]) 09/20/2013  . Anemia 09/19/2013  . Hypercarbia 09/19/2013  . Postoperative anemia due to acute blood loss 09/06/2013  . Thrombocytopenia ([place]) 09/06/2013  . 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 Abnormal stress ECG 09/01/2013  . Asthma 09/01/2013  . S/P appendectomy 09/01/2013    1. OSA CPAP will be started on a pressure of 9CWP.  I reviewed the baseline sleep study with him and also reviewed the CPAP titration study with him.  The patient is going to require a pressure of 9 cm water pressure from his DME of choice based on what  ever his insurance company allows 2. CAd followed by Cardiology and will continue to follow with the cardiology recommendations 3. Murmur stable being seen by Dr. Nehemiah Massed on a regular basis  General Counseling: I have discussed the findings of the evaluation and examination with Biagio.  I have also discussed any further diagnostic evaluation thatmay be needed or ordered today. Casey verbalizes understanding of the findings of todays visit. We also reviewed his medications today and discussed drug interactions and side effects including but not limited excessive drowsiness and altered mental states. We also discussed that there is always a risk not just to him but also people around him. he has been encouraged to call the office with any questions or concerns that should arise related to todays visit.    Time spent: 54min  I have personally obtained a history, examined the patient, evaluated laboratory and imaging results, formulated the assessment and plan and placed orders.    Allyne Gee, MD [facility] Pulmonary and Critical Care Sleep medicine

## 2019-01-03 ENCOUNTER — Telehealth: Payer: Self-pay

## 2019-01-03 NOTE — Telephone Encounter (Signed)
Faxed order for cpap to [facility] and put copy in scan. Beth

## 2019-01-03 NOTE — Telephone Encounter (Signed)
Per [employer] and patient insurance company stated he is not due for new cpap machine until April 2021, pt wife has been advised and we can re submit the information next year. Beth

## 2019-01-05 ENCOUNTER — Encounter: Payer: Self-pay | Admitting: Nurse Practitioner

## 2019-01-07 DIAGNOSIS — I2510 Atherosclerotic heart disease of native coronary artery without angina pectoris: Secondary | ICD-10-CM | POA: Diagnosis not present

## 2019-01-07 DIAGNOSIS — E1165 Type 2 diabetes mellitus with hyperglycemia: Secondary | ICD-10-CM | POA: Diagnosis not present

## 2019-01-07 DIAGNOSIS — I10 Essential (primary) hypertension: Secondary | ICD-10-CM | POA: Diagnosis not present

## 2019-01-12 ENCOUNTER — Other Ambulatory Visit: Payer: Self-pay

## 2019-01-12 DIAGNOSIS — J301 Allergic rhinitis due to pollen: Secondary | ICD-10-CM

## 2019-01-12 MED ORDER — SIMETHICONE 80 MG PO CHEW: 160.0000 mg | CHEWABLE_TABLET | Freq: Two times a day (BID) | ORAL | 0 refills | Status: DC

## 2019-01-12 MED ORDER — LORATADINE 10 MG PO TABS: 10.0000 mg | ORAL_TABLET | Freq: Every day | ORAL | 5 refills | Status: DC

## 2019-01-14 ENCOUNTER — Other Ambulatory Visit: Payer: Self-pay

## 2019-01-14 DIAGNOSIS — G4730 Sleep apnea, unspecified: Secondary | ICD-10-CM | POA: Diagnosis not present

## 2019-01-14 DIAGNOSIS — I2510 Atherosclerotic heart disease of native coronary artery without angina pectoris: Secondary | ICD-10-CM | POA: Diagnosis not present

## 2019-01-14 DIAGNOSIS — I10 Essential (primary) hypertension: Secondary | ICD-10-CM | POA: Diagnosis not present

## 2019-01-14 DIAGNOSIS — E0821 Diabetes mellitus due to underlying condition with diabetic nephropathy: Secondary | ICD-10-CM | POA: Diagnosis not present

## 2019-01-14 DIAGNOSIS — E1165 Type 2 diabetes mellitus with hyperglycemia: Secondary | ICD-10-CM | POA: Diagnosis not present

## 2019-02-02 ENCOUNTER — Ambulatory Visit: Payer: Medicare Other

## 2019-02-02 ENCOUNTER — Other Ambulatory Visit: Payer: Self-pay

## 2019-02-02 MED ORDER — GABAPENTIN 300 MG PO CAPS: ORAL_CAPSULE | ORAL | 0 refills | Status: DC

## 2019-02-04 ENCOUNTER — Other Ambulatory Visit: Payer: Self-pay

## 2019-02-04 ENCOUNTER — Ambulatory Visit (INDEPENDENT_AMBULATORY_CARE_PROVIDER_SITE_OTHER): Payer: Medicare Other | Admitting: Nurse Practitioner

## 2019-02-04 ENCOUNTER — Encounter: Payer: Self-pay | Admitting: Nurse Practitioner

## 2019-02-04 VITALS — BP 131/64 | HR 93 | Resp 16 | Ht 66.0 in | Wt 168.4 lb

## 2019-02-04 DIAGNOSIS — I2510 Atherosclerotic heart disease of native coronary artery without angina pectoris: Secondary | ICD-10-CM

## 2019-02-04 DIAGNOSIS — E1165 Type 2 diabetes mellitus with hyperglycemia: Secondary | ICD-10-CM | POA: Diagnosis not present

## 2019-02-04 DIAGNOSIS — R0602 Shortness of breath: Secondary | ICD-10-CM

## 2019-02-04 DIAGNOSIS — I10 Essential (primary) hypertension: Secondary | ICD-10-CM

## 2019-02-04 DIAGNOSIS — G4733 Obstructive sleep apnea (adult) (pediatric): Secondary | ICD-10-CM

## 2019-02-04 DIAGNOSIS — Z9989 Dependence on other enabling machines and devices: Secondary | ICD-10-CM

## 2019-02-04 DIAGNOSIS — I2583 Coronary atherosclerosis due to lipid rich plaque: Secondary | ICD-10-CM

## 2019-02-04 NOTE — Progress Notes (Signed)
All medications are to go to [facility] mail order

## 2019-02-04 NOTE — Progress Notes (Signed)
[facility] [place], [place] [address]  Internal MEDICINE  Office Visit Note  Patient Name: Daniel Hodges  [id number]  [id number]  Date of Service: 02/20/2019  Chief Complaint  Patient presents with  . Medical Management of Chronic Issues    3 month follow up, medication refills are to go to [facility]    The patient is here for routine follow up visit. Currently seeing endocrinology for management of diabetes. He is now on on toujeo 25units every day and taking novolog three times daily as needed. States that he is feeling 'so-so." he states that his feel hurt sometimes when he is walking. Is frequently walking. He states that he sometimes gets short of breath when he is exercising. But he is otherwise doing well. No other concerns or complaints today.       Current Medication: Outpatient Encounter Medications as of 02/04/2019  Medication Sig  . aspirin 81 MG tablet Take 81 mg by mouth daily.  Marland Kitchen atorvastatin (LIPITOR) 20 MG tablet Take 1 tablet (20 mg total) by mouth at bedtime.  . budesonide-formoterol (SYMBICORT) 160-4.5 MCG/ACT inhaler Inhale 2 puffs into the lungs daily.   . Dulaglutide (TRULICITY) 1.5 QQ/2.2LN SOPN Inject 1.5 mg into the skin once a week.  . ferrous sulfate 325 (65 FE) MG tablet Take 325 mg by mouth 2 (two) times daily with a meal.  . furosemide (LASIX) 40 MG tablet Take 0.5 tablets (20 mg total) by mouth daily.  Marland Kitchen gabapentin (NEURONTIN) 300 MG capsule TAKE 1 CAPSULE BY MOUTH DAILY AND 1 CAPSULE NIGHTLY  . HUMALOG KWIKPEN 100 UNIT/ML KwikPen 6 units with breakfast, 8 units with lunch and 10 units with supper  . ipratropium-albuterol (DUONEB) 0.5-2.5 (3) MG/3ML SOLN USE 3 ML VIA NEBULIZER EVERY 6 HOURS AS NEEDED  . loratadine (CLARITIN) 10 MG tablet Take 1 tablet (10 mg total) by mouth daily.  . metFORMIN (GLUMETZA) 1000 MG (MOD) 24 hr tablet Take 1 tablet (1,000 mg total) by mouth daily with breakfast.  . mometasone (NASONEX) 50  MCG/ACT nasal spray Place 2 sprays into the nose daily.  . montelukast (SINGULAIR) 10 MG tablet Take 1 tablet (10 mg total) by mouth daily.  . Multiple Vitamin (MULTIVITAMIN WITH MINERALS) TABS tablet Take 1 tablet by mouth daily.  Marland Kitchen neomycin-polymyxin-hydrocortisone (CORTISPORIN) 3.5-10000-1 ophthalmic suspension Use 4 drops in both ears BID for 7 days as needed  . niacin 500 MG tablet Take 500 mg by mouth 2 (two) times daily with a meal.  . ONE TOUCH ULTRA TEST test strip USE THREE TIMES DAILY  . polyethylene glycol (MIRALAX / GLYCOLAX) packet Take 17 g by mouth daily.  Marland Kitchen senna (SENOKOT) 8.6 MG tablet Take 1 tablet by mouth daily.  . simethicone (MYLICON) 80 MG chewable tablet Chew 2 tablets (160 mg total) by mouth 2 (two) times daily.  [name] SOLOSTAR 300 UNIT/ML SOPN Inject 25 Units into the skin daily.   . [DISCONTINUED] gabapentin (NEURONTIN) 300 MG capsule TAKE 1 CAPSULE BY MOUTH DAILY AND 1 CAPSULE NIGHTLY (Patient taking differently: Take 300 mg by mouth at bedtime. TAKE 1 CAPSULE BY MOUTH DAILY AND 1 CAPSULE NIGHTLY)  . [DISCONTINUED] loratadine (CLARITIN) 10 MG tablet Take 1 tablet (10 mg total) by mouth daily.  . [DISCONTINUED] simethicone (MYLICON) 80 MG chewable tablet Chew 160 mg by mouth 2 (two) times daily.    No facility-administered encounter medications on file as of 02/04/2019.     Surgical History: Past Surgical History:  Procedure Laterality Date  . APPENDECTOMY    . COLONOSCOPY WITH PROPOFOL N/A 06/11/2015   Procedure: COLONOSCOPY WITH PROPOFOL;  Surgeon: Manya Silvas, MD;  Location: [facility] ENDOSCOPY;  Service: Endoscopy;  Laterality: N/A;  . CORONARY ARTERY BYPASS GRAFT    . HERNIA REPAIR    . TEE WITHOUT CARDIOVERSION    . TRACHEOSTOMY    . VASCULAR SURGERY      Medical History: Past Medical History:  Diagnosis Date  . Anginal pain ([place])   . Asthma   . Coronary artery disease   . Diabetes mellitus without complication ([place])   . Hyperlipidemia   .  Hypertension   . Sleep apnea     Family History: Family History  Problem Relation Age of Onset  . Cancer Sister   . Diabetes Daughter   . Diabetes Son     Social History   Socioeconomic History  . Marital status: Married    Spouse name: Not on file  . Number of children: Not on file  . Years of education: Not on file  . Highest education level: Not on file  Occupational History  . Not on file  Social Needs  . Financial resource strain: Not on file  . Food insecurity:    Worry: Not on file    Inability: Not on file  . Transportation needs:    Medical: Not on file    Non-medical: Not on file  Tobacco Use  . Smoking status: Never Smoker  . Smokeless tobacco: Never Used  Substance and Sexual Activity  . Alcohol use: No  . Drug use: No  . Sexual activity: Not on file  Lifestyle  . Physical activity:    Days per week: Not on file    Minutes per session: Not on file  . Stress: Not on file  Relationships  . Social connections:    Talks on phone: Not on file    Gets together: Not on file    Attends religious service: Not on file    Active member of club or organization: Not on file    Attends meetings of clubs or organizations: Not on file    Relationship status: Not on file  . Intimate partner violence:    Fear of current or ex partner: Not on file    Emotionally abused: Not on file    Physically abused: Not on file    Forced sexual activity: Not on file  Other Topics Concern  . Not on file  Social History Narrative  . Not on file      Review of Systems  Constitutional: Positive for fatigue. Negative for chills and unexpected weight change.  HENT: Negative for congestion, rhinorrhea, sneezing and sore throat.   Respiratory: Positive for shortness of breath. Negative for cough and chest tightness.        SOB with exertion.  Cardiovascular: Negative for chest pain and palpitations.  Gastrointestinal: Positive for constipation. Negative for abdominal pain,  diarrhea, nausea and vomiting.       Chronic  Endocrine: Negative for cold intolerance, heat intolerance, polydipsia and polyuria.       Patient is seeing endocrinology for diabetic management.   Musculoskeletal: Negative for arthralgias, back pain, joint swelling and neck pain.  Skin: Negative for rash.  Allergic/Immunologic: Positive for environmental allergies.  Neurological: Negative for dizziness, tremors, numbness and headaches.  Hematological: Negative for adenopathy. Does not bruise/bleed easily.  Psychiatric/Behavioral: Negative for behavioral problems, sleep disturbance and suicidal ideas. The patient  is not nervous/anxious.     Today's Vitals   02/04/19 1442  BP: 131/64  Pulse: 93  Resp: 16  SpO2: 96%  Weight: 168 lb 6.4 oz (76.4 kg)  Height: 5\' 6"  (1.676 m)   Body mass index is 27.18 kg/m.  Physical Exam Vitals signs and nursing note reviewed.  Constitutional:      General: He is not in acute distress.    Appearance: Normal appearance. He is well-developed. He is not diaphoretic.  HENT:     Head: Normocephalic and atraumatic.     Nose: Nose normal.     Mouth/Throat:     Pharynx: No oropharyngeal exudate.  Eyes:     Pupils: Pupils are equal, round, and reactive to light.  Neck:     Musculoskeletal: Normal range of motion and neck supple.     Thyroid: No thyromegaly.     Vascular: No carotid bruit or JVD.     Trachea: No tracheal deviation.  Cardiovascular:     Rate and Rhythm: Normal rate and regular rhythm.     Heart sounds: Normal heart sounds. No murmur. No friction rub. No gallop.      Comments: Irregular heart rhythm with soft, systolic murmur present.  Pulmonary:     Effort: Pulmonary effort is normal. No respiratory distress.     Breath sounds: Normal breath sounds. No wheezing or rales.  Chest:     Chest wall: No tenderness.  Abdominal:     General: Bowel sounds are normal.     Palpations: Abdomen is soft.     Tenderness: There is no abdominal  tenderness.  Musculoskeletal: Normal range of motion.  Lymphadenopathy:     Cervical: No cervical adenopathy.  Skin:    General: Skin is warm and dry.  Neurological:     Mental Status: He is alert and oriented to person, place, and time.     Cranial Nerves: No cranial nerve deficit.  Psychiatric:        Behavior: Behavior normal.        Thought Content: Thought content normal.        Judgment: Judgment normal.   Assessment/Plan: 1. Essential hypertension Stable. Continue BP medication as prescribed   2. SOB (shortness of breath) Stable and mostly with exertion. Will monitor closely.  3. Coronary artery disease due to lipid rich plaque Continue regular visits with cardiology as scheduled  4. OSA on CPAP Continue CPAP as prescribed   5. Uncontrolled type 2 diabetes mellitus with hyperglycemia ([place]) Refer for diabetic eye exam.  - Ambulatory referral to Ophthalmology  General Counseling: Ralpheal verbalizes understanding of the findings of todays visit and agrees with plan of treatment. I have discussed any further diagnostic evaluation that may be needed or ordered today. We also reviewed his medications today. he has been encouraged to call the office with any questions or concerns that should arise related to todays visit.  Hypertension Counseling:   The following hypertensive lifestyle modification were recommended and discussed:  1. Limiting alcohol intake to less than 1 oz/day of ethanol:(24 oz of beer or 8 oz of wine or 2 oz of 100-proof whiskey). 2. Take baby ASA 81 mg daily. 3. Importance of regular aerobic exercise and losing weight. 4. Reduce dietary saturated fat and cholesterol intake for overall cardiovascular health. 5. Maintaining adequate dietary potassium, calcium, and magnesium intake. 6. Regular monitoring of the blood pressure. 7. Reduce sodium intake to less than 100 mmol/day (less than 2.3 gm of sodium or  less than 6 gm of sodium choride)   This  patient was seen by [place] with Dr Lavera Guise as a part of collaborative care agreement  Orders Placed This Encounter  Procedures  . Ambulatory referral to Ophthalmology      Time spent: [place] [facility]

## 2019-02-24 ENCOUNTER — Other Ambulatory Visit: Payer: Self-pay

## 2019-03-01 ENCOUNTER — Other Ambulatory Visit: Payer: Self-pay

## 2019-03-01 MED ORDER — NIACIN ER 500 MG PO CPCR: 500.0000 mg | ORAL_CAPSULE | Freq: Every day | ORAL | 0 refills | Status: DC

## 2019-03-01 NOTE — Telephone Encounter (Signed)
Spoke with pt wife he was on niacin 500 OTC she want pres as per heather change niacin ER once daily and send to [facility]

## 2019-03-03 ENCOUNTER — Other Ambulatory Visit: Payer: Self-pay

## 2019-03-11 ENCOUNTER — Encounter: Payer: Self-pay | Admitting: Nurse Practitioner

## 2019-03-11 ENCOUNTER — Other Ambulatory Visit: Payer: Self-pay

## 2019-03-11 ENCOUNTER — Ambulatory Visit (INDEPENDENT_AMBULATORY_CARE_PROVIDER_SITE_OTHER): Payer: Medicare Other | Admitting: Nurse Practitioner

## 2019-03-11 VITALS — BP 118/60 | HR 73 | Resp 16 | Ht 66.0 in | Wt 168.6 lb

## 2019-03-11 DIAGNOSIS — R300 Dysuria: Secondary | ICD-10-CM

## 2019-03-11 DIAGNOSIS — E1165 Type 2 diabetes mellitus with hyperglycemia: Secondary | ICD-10-CM

## 2019-03-11 DIAGNOSIS — E782 Mixed hyperlipidemia: Secondary | ICD-10-CM | POA: Diagnosis not present

## 2019-03-11 DIAGNOSIS — D509 Iron deficiency anemia, unspecified: Secondary | ICD-10-CM

## 2019-03-11 DIAGNOSIS — I2510 Atherosclerotic heart disease of native coronary artery without angina pectoris: Secondary | ICD-10-CM

## 2019-03-11 DIAGNOSIS — Z0001 Encounter for general adult medical examination with abnormal findings: Secondary | ICD-10-CM

## 2019-03-11 DIAGNOSIS — I2583 Coronary atherosclerosis due to lipid rich plaque: Secondary | ICD-10-CM

## 2019-03-11 MED ORDER — FERROUS SULFATE 325 (65 FE) MG PO TABS: 325.0000 mg | ORAL_TABLET | Freq: Two times a day (BID) | ORAL | 3 refills | Status: DC

## 2019-03-11 MED ORDER — NIACIN ER 500 MG PO CPCR: 500.0000 mg | ORAL_CAPSULE | Freq: Every day | ORAL | 3 refills | Status: DC

## 2019-03-11 NOTE — Progress Notes (Signed)
[facility] [place], [place] [address]  Internal MEDICINE  Office Visit Note  Patient Name: Daniel Hodges  [id number]  [id number]  Date of Service: 03/13/2019   Pt is here for routine health maintenance examination   Chief Complaint  Patient presents with  . Medicare Wellness  . Hypertension  . Hyperlipidemia     The patient is here for health maintenance exam. Blood pressure is well managed. He is now seeing endocrinology for diabetes control. He did bring blood sugar log with him. Generally, the sugars are running around 100. He has appointment for his diabetic eye exam 03/17/2019. He is due to have routine, fasting labs done. He has no concerns or complaints today.    Current Medication: No facility-administered encounter medications on file as of 03/11/2019.    Outpatient Encounter Medications as of 03/11/2019  Medication Sig  . aspirin 81 MG tablet Take 81 mg by mouth daily.  Marland Kitchen atorvastatin (LIPITOR) 20 MG tablet Take 1 tablet (20 mg total) by mouth at bedtime.  . budesonide-formoterol (SYMBICORT) 160-4.5 MCG/ACT inhaler Inhale 2 puffs into the lungs 2 (two) times daily.   . Dulaglutide (TRULICITY) 1.5 AS/3.4HD SOPN Inject 1.5 mg into the skin once a week.  . ferrous sulfate 325 (65 FE) MG tablet Take 1 tablet (325 mg total) by mouth 2 (two) times daily with a meal.  . furosemide (LASIX) 40 MG tablet Take 0.5 tablets (20 mg total) by mouth daily.  Marland Kitchen gabapentin (NEURONTIN) 300 MG capsule TAKE 1 CAPSULE BY MOUTH DAILY AND 1 CAPSULE NIGHTLY (Patient taking differently: Take 600 mg by mouth at bedtime. )  . HUMALOG KWIKPEN 100 UNIT/ML KwikPen Inject 6-10 Units into the skin See admin instructions. Inject 6u under the skin daily at breakfast-time, 8u at lunch-time and inject 10u under the skin daily at dinner-time  . ipratropium-albuterol (DUONEB) 0.5-2.5 (3) MG/3ML SOLN USE 3 ML VIA NEBULIZER EVERY 6 HOURS AS NEEDED (Patient taking differently: Inhale  3 mLs into the lungs every 6 (six) hours as needed (shortness of breath). )  . loratadine (CLARITIN) 10 MG tablet Take 1 tablet (10 mg total) by mouth daily.  . metFORMIN (GLUMETZA) 1000 MG (MOD) 24 hr tablet Take 1 tablet (1,000 mg total) by mouth daily with breakfast.  . mometasone (NASONEX) 50 MCG/ACT nasal spray Place 2 sprays into the nose daily.  . montelukast (SINGULAIR) 10 MG tablet Take 1 tablet (10 mg total) by mouth daily. (Patient taking differently: Take 10 mg by mouth at bedtime. )  . Multiple Vitamin (MULTIVITAMIN WITH MINERALS) TABS tablet Take 1 tablet by mouth daily.  . niacin 500 MG CR capsule Take 1 capsule (500 mg total) by mouth at bedtime. (Patient taking differently: Take 500 mg by mouth daily. )  . ONE TOUCH ULTRA TEST test strip USE THREE TIMES DAILY  . senna (SENOKOT) 8.6 MG tablet Take 1 tablet by mouth daily.  . simethicone (MYLICON) 80 MG chewable tablet Chew 2 tablets (160 mg total) by mouth 2 (two) times daily.  [name] SOLOSTAR 300 UNIT/ML SOPN Inject 25 Units into the skin at bedtime.   . [DISCONTINUED] ferrous sulfate 325 (65 FE) MG tablet Take 325 mg by mouth 2 (two) times daily with a meal.  . [DISCONTINUED] neomycin-polymyxin-hydrocortisone (CORTISPORIN) 3.5-10000-1 ophthalmic suspension Use 4 drops in both ears BID for 7 days as needed  . [DISCONTINUED] niacin 500 MG CR capsule Take 1 capsule (500 mg total) by mouth at bedtime.  . [  DISCONTINUED] polyethylene glycol (MIRALAX / GLYCOLAX) packet Take 17 g by mouth daily.    Surgical History: Past Surgical History:  Procedure Laterality Date  . APPENDECTOMY    . COLONOSCOPY WITH PROPOFOL N/A 06/11/2015   Procedure: COLONOSCOPY WITH PROPOFOL;  Surgeon: Manya Silvas, MD;  Location[facility] ENDOSCOPY;  Service: Endoscopy;  Laterality: N/A;  . CORONARY ARTERY BYPASS GRAFT    . HERNIA REPAIR    . TEE WITHOUT CARDIOVERSION    . TRACHEOSTOMY    . VASCULAR SURGERY      Medical History: Past Medical History:   Diagnosis Date  . Anginal pain ([place])   . Asthma   . Coronary artery disease   . Diabetes mellitus without complication ([place])   . Hyperlipidemia   . Hypertension   . Sleep apnea     Family History: Family History  Problem Relation Age of Onset  . Cancer Sister   . Diabetes Daughter   . Diabetes Son       Review of Systems  Constitutional: Positive for fatigue. Negative for chills and unexpected weight change.  HENT: Negative for congestion, rhinorrhea, sneezing and sore throat.   Respiratory: Positive for shortness of breath. Negative for cough and chest tightness.        SOB with exertion.  Cardiovascular: Negative for chest pain and palpitations.  Gastrointestinal: Positive for constipation. Negative for abdominal pain, diarrhea, nausea and vomiting.       Chronic  Endocrine: Negative for cold intolerance, heat intolerance, polydipsia and polyuria.       Patient is seeing endocrinology for diabetic management.   Musculoskeletal: Negative for arthralgias, back pain, joint swelling and neck pain.  Skin: Negative for rash.  Allergic/Immunologic: Positive for environmental allergies.  Neurological: Negative for dizziness, tremors, numbness and headaches.  Hematological: Negative for adenopathy. Does not bruise/bleed easily.  Psychiatric/Behavioral: Negative for behavioral problems, sleep disturbance and suicidal ideas. The patient is not nervous/anxious.      Today's Vitals   03/11/19 1033  BP: 118/60  Pulse: 73  Resp: 16  SpO2: 99%  Weight: 168 lb 9.6 oz (76.5 kg)  Height: 5\' 6"  (1.676 m)   Body mass index is 27.21 kg/m.  Physical Exam Vitals signs and nursing note reviewed.  Constitutional:      General: He is not in acute distress.    Appearance: Normal appearance. He is well-developed. He is not diaphoretic.  HENT:     Head: Normocephalic and atraumatic.     Nose: Nose normal.     Mouth/Throat:     Pharynx: No oropharyngeal exudate.  Eyes:      Extraocular Movements: Extraocular movements intact.     Conjunctiva/sclera: Conjunctivae normal.     Pupils: Pupils are equal, round, and reactive to light.  Neck:     Musculoskeletal: Normal range of motion and neck supple.     Thyroid: No thyromegaly.     Vascular: No carotid bruit or JVD.     Trachea: No tracheal deviation.  Cardiovascular:     Rate and Rhythm: Normal rate and regular rhythm.     Pulses:          Dorsalis pedis pulses are 1+ on the right side and 1+ on the left side.       Posterior tibial pulses are 1+ on the right side and 1+ on the left side.     Heart sounds: Normal heart sounds. No murmur. No friction rub. No gallop.      Comments:  Irregular heart rhythm with soft, systolic murmur present.  Pulmonary:     Effort: Pulmonary effort is normal. No respiratory distress.     Breath sounds: Normal breath sounds. No wheezing or rales.  Chest:     Chest wall: No tenderness.  Abdominal:     General: Bowel sounds are normal.     Palpations: Abdomen is soft.     Tenderness: There is no abdominal tenderness.  Musculoskeletal: Normal range of motion.     Right foot: Normal range of motion. No deformity.     Left foot: Normal range of motion. No deformity.  Feet:     Right foot:     Protective Sensation: 10 sites tested. 10 sites sensed.     Skin integrity: Skin integrity normal.     Toenail Condition: Right toenails are normal.     Left foot:     Protective Sensation: 10 sites tested. 10 sites sensed.     Skin integrity: Skin integrity normal.     Toenail Condition: Left toenails are normal.  Lymphadenopathy:     Cervical: No cervical adenopathy.  Skin:    General: Skin is warm and dry.     Capillary Refill: Capillary refill takes 2 to 3 seconds.  Neurological:     General: No focal deficit present.     Mental Status: He is alert and oriented to person, place, and time.     Cranial Nerves: No cranial nerve deficit.  Psychiatric:        Behavior: Behavior  normal.        Thought Content: Thought content normal.        Judgment: Judgment normal.    Depression screen [place] 2/9 03/11/2019 02/04/2019 12/21/2018 10/05/2018 08/06/2018  Decreased Interest 0 0 0 0 0  Down, Depressed, Hopeless 0 0 0 0 0  PHQ - 2 Score 0 0 0 0 0    Functional Status Survey: Is the patient deaf or have difficulty hearing?: No Does the patient have difficulty seeing, even when wearing glasses/contacts?: No Does the patient have difficulty concentrating, remembering, or making decisions?: No Does the patient have difficulty walking or climbing stairs?: No Does the patient have difficulty dressing or bathing?: No Does the patient have difficulty doing errands alone such as visiting a doctor's office or shopping?: No  MMSE - [place] Exam 03/11/2019 02/26/2018  Orientation to time 5 5  Orientation to Place 5 5  Registration 3 3  Attention/ Calculation 5 5  Recall 3 3  Language- name 2 objects 2 2  Language- repeat 1 1  Language- follow 3 step command 3 3  Language- read & follow direction 1 1  Write a sentence 1 1  Copy design 1 1  Total score 30 30    Fall Risk  03/11/2019 02/04/2019 12/21/2018 10/05/2018 08/06/2018  Falls in the past year? 0 0 0 0 0  Number falls in past yr: - - - 0 -  Injury with Fall? - - - 0 -      LABS: Recent Results (from the past 2160 hour(s))  UA/M w/rflx Culture, Routine     Status: None   Collection Time: 03/11/19 10:34 AM   Specimen: Urine   URINE  Result Value Ref Range   Specific Gravity, UA 1.010 1.005 - 1.030   pH, UA 5.0 5.0 - 7.5   Color, UA Yellow Yellow   Appearance Ur Clear Clear   Leukocytes,UA Negative Negative   Protein,UA Negative Negative/Trace  Glucose, UA Negative Negative   Ketones, UA Negative Negative   RBC, UA Negative Negative   Bilirubin, UA Negative Negative   Urobilinogen, Ur 0.2 0.2 - 1.0 mg/dL   Nitrite, UA Negative Negative   Microscopic Examination Comment     Comment: Microscopic  follows if indicated.   Microscopic Examination See below:     Comment: Microscopic was indicated and was performed.   Urinalysis Reflex Comment     Comment: This specimen will not reflex to a Urine Culture.  Microscopic Examination     Status: None   Collection Time: 03/11/19 10:34 AM   URINE  Result Value Ref Range   WBC, UA 0-5 0 - 5 /hpf   RBC None seen 0 - 2 /hpf   Epithelial Cells (non renal) None seen 0 - 10 /hpf   Casts None seen None seen /lpf   Bacteria, UA None seen None seen/Few  Hemoglobin A1c     Status: Abnormal   Collection Time: 03/12/19 12:23 PM  Result Value Ref Range   Hgb A1c MFr Bld 6.8 (H) 4.8 - 5.6 %    Comment: (NOTE) Pre diabetes:          5.7%-6.4% Diabetes:              >6.4% Glycemic control for   <7.0% adults with diabetes    Mean Plasma Glucose 148.46 mg/dL    Comment: Performed at [facility], [address] [place], [place] [address]  Lipase, blood     Status: None   Collection Time: 03/12/19 12:33 PM  Result Value Ref Range   Lipase 36 11 - 51 U/L    Comment: Performed at [facility], [address] [place], [place] [address]  Comprehensive metabolic panel     Status: Abnormal   Collection Time: 03/12/19 12:33 PM  Result Value Ref Range   Sodium 135 135 - 145 mmol/L   Potassium 5.2 (H) 3.5 - 5.1 mmol/L   Chloride 100 98 - 111 mmol/L   CO2 24 22 - 32 mmol/L   Glucose, Bld 227 (H) 70 - 99 mg/dL   BUN 20 8 - 23 mg/dL   Creatinine, Ser 1.24 0.61 - 1.24 mg/dL   Calcium 9.2 8.9 - 10.3 mg/dL   Total Protein 8.3 (H) 6.5 - 8.1 g/dL   Albumin 4.3 3.5 - 5.0 g/dL   AST 23 15 - 41 U/L   ALT 22 0 - 44 U/L   Alkaline Phosphatase 111 38 - 126 U/L   Total Bilirubin 0.7 0.3 - 1.2 mg/dL   GFR calc non Af Amer 55 (L) >60 mL/min   GFR calc Af Amer >60 >60 mL/min   Anion gap 11 5 - 15    Comment: Performed at [facility], [address] [place], [place] [address]  CBC     Status: Abnormal   Collection Time: 03/12/19  12:33 PM  Result Value Ref Range   WBC 14.0 (H) 4.0 - 10.5 K/uL   RBC 5.14 4.22 - 5.81 MIL/uL   Hemoglobin 13.8 13.0 - 17.0 g/dL   HCT 43.6 39.0 - 52.0 %   MCV 84.8 80.0 - 100.0 fL   MCH 26.8 26.0 - 34.0 pg   MCHC 31.7 30.0 - 36.0 g/dL   RDW 14.1 11.5 - 15.5 %   Platelets 239 150 - 400 K/uL   nRBC 0.0 0.0 - 0.2 %    Comment: Performed at [facility], [address] [place], [place] [address]  Glucose, capillary     Status: Abnormal   Collection Time: 03/12/19  3:27 PM  Result Value Ref Range   Glucose-Capillary 192 (H) 70 - 99 mg/dL  Urinalysis, Complete w Microscopic     Status: Abnormal   Collection Time: 03/12/19  5:42 PM  Result Value Ref Range   Color, Urine YELLOW (A) YELLOW   APPearance CLEAR (A) CLEAR   Specific Gravity, Urine >1.046 (H) 1.005 - 1.030   pH 5.0 5.0 - 8.0   Glucose, UA NEGATIVE NEGATIVE mg/dL   Hgb urine dipstick NEGATIVE NEGATIVE   Bilirubin Urine NEGATIVE NEGATIVE   Ketones, ur NEGATIVE NEGATIVE mg/dL   Protein, ur NEGATIVE NEGATIVE mg/dL   Nitrite NEGATIVE NEGATIVE   Leukocytes,Ua NEGATIVE NEGATIVE   RBC / HPF 0-5 0 - 5 RBC/hpf   WBC, UA 0-5 0 - 5 WBC/hpf   Bacteria, UA NONE SEEN NONE SEEN   Squamous Epithelial / LPF 0-5 0 - 5   Mucus PRESENT     Comment: Performed at [facility], [address] [place],  [address]  SARS Coronavirus 2 (CEPHEID - Performed in [facility] hospital lab), Hosp Order     Status: None   Collection Time: 03/12/19  7:56 PM   Specimen: Nasopharyngeal Swab  Result Value Ref Range   SARS Coronavirus 2 NEGATIVE NEGATIVE    Comment: (NOTE) If result is NEGATIVE SARS-CoV-2 target nucleic acids are NOT DETECTED. The SARS-CoV-2 RNA is generally detectable in upper and lower  respiratory specimens during the acute phase of infection. The lowest  concentration of SARS-CoV-2 viral copies this assay can detect is 250  copies / mL. A negative result does not preclude SARS-CoV-2 infection  and should  not be used as the sole basis for treatment or other  patient management decisions.  A negative result may occur with  improper specimen collection / handling, submission of specimen other  than nasopharyngeal swab, presence of viral mutation(s) within the  areas targeted by this assay, and inadequate number of viral copies  (<250 copies / mL). A negative result must be combined with clinical  observations, patient history, and epidemiological information. If result is POSITIVE SARS-CoV-2 target nucleic acids are DETECTED. The SARS-CoV-2 RNA is generally detectable in upper and lower  respiratory specimens dur ing the acute phase of infection.  Positive  results are indicative of active infection with SARS-CoV-2.  Clinical  correlation with patient history and other diagnostic information is  necessary to determine patient infection status.  Positive results do  not rule out bacterial infection or co-infection with other viruses. If result is PRESUMPTIVE POSTIVE SARS-CoV-2 nucleic acids MAY BE PRESENT.   A presumptive positive result was obtained on the submitted specimen  and confirmed on repeat testing.  While 2019 novel coronavirus  (SARS-CoV-2) nucleic acids may be present in the submitted sample  additional confirmatory testing may be necessary for epidemiological  and / or clinical management purposes  to differentiate between  SARS-CoV-2 and other Sarbecovirus currently known to infect humans.  If clinically indicated additional testing with an alternate test  methodology [phone]) is advised. The SARS-CoV-2 RNA is generally  detectable in upper and lower respiratory sp ecimens during the acute  phase of infection. The expected result is Negative. Fact Sheet for Patients:  StrictlyIdeas.no Fact Sheet for Healthcare Providers: BankingDealers.co.za This test is not yet approved or cleared by the [place] FDA and has been  authorized for detection  and/or diagnosis of SARS-CoV-2 by FDA under an Emergency Use Authorization (EUA).  This EUA will remain in effect (meaning this test can be used) for the duration of the COVID-19 declaration under Section 564(b)(1) of the Act, 21 U.S.C. section 360bbb-3(b)(1), unless the authorization is terminated or revoked sooner. Performed at [facility], [address] [place], [place] [address]   Glucose, capillary     Status: Abnormal   Collection Time: 03/12/19  9:11 PM  Result Value Ref Range   Glucose-Capillary 200 (H) 70 - 99 mg/dL  Glucose, capillary     Status: Abnormal   Collection Time: 03/13/19 12:47 AM  Result Value Ref Range   Glucose-Capillary 130 (H) 70 - 99 mg/dL  Glucose, capillary     Status: None   Collection Time: 03/13/19  4:50 AM  Result Value Ref Range   Glucose-Capillary 72 70 - 99 mg/dL  Basic metabolic panel     Status: Abnormal   Collection Time: 03/13/19  5:51 AM  Result Value Ref Range   Sodium 135 135 - 145 mmol/L   Potassium 5.3 (H) 3.5 - 5.1 mmol/L   Chloride 100 98 - 111 mmol/L   CO2 25 22 - 32 mmol/L   Glucose, Bld 139 (H) 70 - 99 mg/dL   BUN 26 (H) 8 - 23 mg/dL   Creatinine, Ser 1.46 (H) 0.61 - 1.24 mg/dL   Calcium 8.6 (L) 8.9 - 10.3 mg/dL   GFR calc non Af Amer 45 (L) >60 mL/min   GFR calc Af Amer 53 (L) >60 mL/min   Anion gap 10 5 - 15    Comment: Performed at [facility], [address] [place], [place] [address]  Magnesium     Status: None   Collection Time: 03/13/19  5:51 AM  Result Value Ref Range   Magnesium 2.2 1.7 - 2.4 mg/dL    Comment: Performed at [facility], [address] [place], [place] [address]  CBC WITH DIFFERENTIAL     Status: Abnormal   Collection Time: 03/13/19  5:51 AM  Result Value Ref Range   WBC 11.0 (H) 4.0 - 10.5 K/uL   RBC 4.76 4.22 - 5.81 MIL/uL   Hemoglobin 12.8 (L) 13.0 - 17.0 g/dL   HCT 40.5 39.0 - 52.0 %   MCV 85.1 80.0 - 100.0 fL   MCH 26.9 26.0 -  34.0 pg   MCHC 31.6 30.0 - 36.0 g/dL   RDW 13.9 11.5 - 15.5 %   Platelets 208 150 - 400 K/uL   nRBC 0.0 0.0 - 0.2 %   Neutrophils Relative % 82 %   Neutro Abs 9.0 (H) 1.7 - 7.7 K/uL   Lymphocytes Relative 6 %   Lymphs Abs 0.6 (L) 0.7 - 4.0 K/uL   Monocytes Relative 12 %   Monocytes Absolute 1.3 (H) 0.1 - 1.0 K/uL   Eosinophils Relative 0 %   Eosinophils Absolute 0.0 0.0 - 0.5 K/uL   Basophils Relative 0 %   Basophils Absolute 0.0 0.0 - 0.1 K/uL   Immature Granulocytes 0 %   Abs Immature Granulocytes 0.04 0.00 - 0.07 K/uL    Comment: Performed at [facility], [address] [place], [place] [address]  Glucose, capillary     Status: Abnormal   Collection Time: 03/13/19  6:15 AM  Result Value Ref Range   Glucose-Capillary 143 (H) 70 - 99 mg/dL  Glucose, capillary     Status: Abnormal   Collection Time: 03/13/19  7:45 AM  Result Value Ref Range   Glucose-Capillary 131 (H) 70 - 99 mg/dL   Comment 1 Notify RN   Glucose, capillary     Status: None   Collection Time: 03/13/19 12:10 PM  Result Value Ref Range   Glucose-Capillary 99 70 - 99 mg/dL   Comment 1 Notify RN   Glucose, capillary     Status: Abnormal   Collection Time: 03/13/19  4:29 PM  Result Value Ref Range   Glucose-Capillary 102 (H) 70 - 99 mg/dL   Comment 1 Notify RN    Assessment/Plan: 1. Encounter for general adult medical examination with abnormal findings Annual health maintenance exam today.  2. Mixed hyperlipidemia New prescription for Niacin CR 500mg  capsules sent to mail order pharmacy. Continue other cholesterol lowering medications as prescribed  - niacin 500 MG CR capsule; Take 1 capsule (500 mg total) by mouth at bedtime. (Patient taking differently: Take 500 mg by mouth daily. )  Dispense: 90 capsule; Refill: 3  3. Coronary artery disease due to lipid rich plaque Regular visits with cardiology as scheduled.   4. Iron deficiency anemia, unspecified iron deficiency anemia type May continue  iron supplement as prescrbed.  - ferrous sulfate 325 (65 FE) MG tablet; Take 1 tablet (325 mg total) by mouth 2 (two) times daily with a meal.  Dispense: 180 tablet; Refill: 3  5. Dysuria - UA/M w/rflx Culture, Routine  6. Uncontrolled type 2 diabetes mellitus with hyperglycemia ([place]) Continue regular visits with endocrinology as scheduled.   General Counseling: Jeremias verbalizes understanding of the findings of todays visit and agrees with plan of treatment. I have discussed any further diagnostic evaluation that may be needed or ordered today. We also reviewed his medications today. he has been encouraged to call the office with any questions or concerns that should arise related to todays visit.    Counseling:  Cardiac risk factor modification:  1. Control blood pressure. 2. Exercise as prescribed. 3. Follow low sodium, low fat diet. and low fat and low cholestrol diet. 4. Take ASA 81mg  once a day. 5. Restricted calories diet to lose weight.  This patient was seen by Leretha Pol FNP Collaboration with Dr Lavera Guise as a part of collaborative care agreement  Orders Placed This Encounter  Procedures  . Microscopic Examination  . UA/M w/rflx Culture, Routine    Meds ordered this encounter  Medications  . niacin 500 MG CR capsule    Sig: Take 1 capsule (500 mg total) by mouth at bedtime.    Dispense:  90 capsule    Refill:  3    Order Specific Question:   Supervising Provider    Answer:   Lavera Guise [[id number]]  . ferrous sulfate 325 (65 FE) MG tablet    Sig: Take 1 tablet (325 mg total) by mouth 2 (two) times daily with a meal.    Dispense:  180 tablet    Refill:  3    Order Specific Question:   Supervising Provider    Answer:   Lavera Guise [[id number]]    Time spent: [place], MD  Internal Medicine

## 2019-03-12 ENCOUNTER — Inpatient Hospital Stay
Admission: EM | Admit: 2019-03-12 | Discharge: 2019-03-16 | DRG: 390 | Disposition: A | Discharge status: Home / Self Care | Payer: Medicare Other | Attending: Surgery | Admitting: Surgery

## 2019-03-12 ENCOUNTER — Inpatient Hospital Stay: Payer: Medicare Other

## 2019-03-12 ENCOUNTER — Emergency Department: Payer: Medicare Other

## 2019-03-12 ENCOUNTER — Other Ambulatory Visit: Payer: Self-pay

## 2019-03-12 DIAGNOSIS — Z951 Presence of aortocoronary bypass graft: Secondary | ICD-10-CM

## 2019-03-12 DIAGNOSIS — I2510 Atherosclerotic heart disease of native coronary artery without angina pectoris: Secondary | ICD-10-CM | POA: Diagnosis present

## 2019-03-12 DIAGNOSIS — Z794 Long term (current) use of insulin: Secondary | ICD-10-CM | POA: Diagnosis not present

## 2019-03-12 DIAGNOSIS — I10 Essential (primary) hypertension: Secondary | ICD-10-CM | POA: Diagnosis not present

## 2019-03-12 DIAGNOSIS — K565 Intestinal adhesions [bands], unspecified as to partial versus complete obstruction: Secondary | ICD-10-CM | POA: Diagnosis not present

## 2019-03-12 DIAGNOSIS — Z880 Allergy status to penicillin: Secondary | ICD-10-CM | POA: Diagnosis not present

## 2019-03-12 DIAGNOSIS — R55 Syncope and collapse: Secondary | ICD-10-CM | POA: Diagnosis not present

## 2019-03-12 DIAGNOSIS — K56609 Unspecified intestinal obstruction, unspecified as to partial versus complete obstruction: Secondary | ICD-10-CM | POA: Diagnosis not present

## 2019-03-12 DIAGNOSIS — Z20828 Contact with and (suspected) exposure to other viral communicable diseases: Secondary | ICD-10-CM | POA: Diagnosis present

## 2019-03-12 DIAGNOSIS — R1084 Generalized abdominal pain: Secondary | ICD-10-CM | POA: Diagnosis not present

## 2019-03-12 DIAGNOSIS — K5651 Intestinal adhesions [bands], with partial obstruction: Principal | ICD-10-CM | POA: Diagnosis present

## 2019-03-12 DIAGNOSIS — R51 Headache: Secondary | ICD-10-CM | POA: Diagnosis not present

## 2019-03-12 DIAGNOSIS — E119 Type 2 diabetes mellitus without complications: Secondary | ICD-10-CM | POA: Diagnosis not present

## 2019-03-12 DIAGNOSIS — Z79899 Other long term (current) drug therapy: Secondary | ICD-10-CM | POA: Diagnosis not present

## 2019-03-12 DIAGNOSIS — J449 Chronic obstructive pulmonary disease, unspecified: Secondary | ICD-10-CM | POA: Diagnosis not present

## 2019-03-12 DIAGNOSIS — Z4659 Encounter for fitting and adjustment of other gastrointestinal appliance and device: Secondary | ICD-10-CM

## 2019-03-12 DIAGNOSIS — Z888 Allergy status to other drugs, medicaments and biological substances status: Secondary | ICD-10-CM

## 2019-03-12 DIAGNOSIS — G4733 Obstructive sleep apnea (adult) (pediatric): Secondary | ICD-10-CM | POA: Diagnosis present

## 2019-03-12 DIAGNOSIS — E1140 Type 2 diabetes mellitus with diabetic neuropathy, unspecified: Secondary | ICD-10-CM | POA: Diagnosis not present

## 2019-03-12 DIAGNOSIS — Z7951 Long term (current) use of inhaled steroids: Secondary | ICD-10-CM

## 2019-03-12 DIAGNOSIS — R42 Dizziness and giddiness: Secondary | ICD-10-CM | POA: Diagnosis not present

## 2019-03-12 DIAGNOSIS — K562 Volvulus: Secondary | ICD-10-CM | POA: Diagnosis not present

## 2019-03-12 DIAGNOSIS — R109 Unspecified abdominal pain: Secondary | ICD-10-CM | POA: Diagnosis present

## 2019-03-12 DIAGNOSIS — D72829 Elevated white blood cell count, unspecified: Secondary | ICD-10-CM | POA: Diagnosis present

## 2019-03-12 DIAGNOSIS — Z7982 Long term (current) use of aspirin: Secondary | ICD-10-CM

## 2019-03-12 DIAGNOSIS — Z4682 Encounter for fitting and adjustment of non-vascular catheter: Secondary | ICD-10-CM | POA: Diagnosis not present

## 2019-03-12 DIAGNOSIS — Z833 Family history of diabetes mellitus: Secondary | ICD-10-CM

## 2019-03-12 DIAGNOSIS — E785 Hyperlipidemia, unspecified: Secondary | ICD-10-CM | POA: Diagnosis present

## 2019-03-12 DIAGNOSIS — E875 Hyperkalemia: Secondary | ICD-10-CM | POA: Diagnosis not present

## 2019-03-12 LAB — URINALYSIS, COMPLETE (UACMP) WITH MICROSCOPIC
Bacteria, UA: NONE SEEN
Bilirubin Urine: NEGATIVE
Glucose, UA: NEGATIVE mg/dL
Hgb urine dipstick: NEGATIVE
Ketones, ur: NEGATIVE mg/dL
Leukocytes,Ua: NEGATIVE
Nitrite: NEGATIVE
Protein, ur: NEGATIVE mg/dL
Specific Gravity, Urine: >1.046 — ABNORMAL HIGH (ref 1.005–1.030)
pH: 5 (ref 5.0–8.0)

## 2019-03-12 LAB — CBC
HCT: 43.6 % (ref 39.0–52.0)
Hemoglobin: 13.8 g/dL (ref 13.0–17.0)
MCH: 26.8 pg (ref 26.0–34.0)
MCHC: 31.7 g/dL (ref 30.0–36.0)
MCV: 84.8 fL (ref 80.0–100.0)
Platelets: 239 10*3/uL (ref 150–400)
RBC: 5.14 MIL/uL (ref 4.22–5.81)
RDW: 14.1 % (ref 11.5–15.5)
WBC: 14 10*3/uL — ABNORMAL HIGH (ref 4.0–10.5)
nRBC: 0 % (ref 0.0–0.2)

## 2019-03-12 LAB — COMPREHENSIVE METABOLIC PANEL
ALT: 22 U/L (ref 0–44)
AST: 23 U/L (ref 15–41)
Albumin: 4.3 g/dL (ref 3.5–5.0)
Alkaline Phosphatase: 111 U/L (ref 38–126)
Anion gap: 11 (ref 5–15)
BUN: 20 mg/dL (ref 8–23)
CO2: 24 mmol/L (ref 22–32)
Calcium: 9.2 mg/dL (ref 8.9–10.3)
Chloride: 100 mmol/L (ref 98–111)
Creatinine, Ser: 1.24 mg/dL (ref 0.61–1.24)
GFR calc Af Amer: >60 mL/min (ref >60)
GFR calc non Af Amer: 55 mL/min — ABNORMAL LOW (ref >60)
Glucose, Bld: 227 mg/dL — ABNORMAL HIGH (ref 70–99)
Potassium: 5.2 mmol/L — ABNORMAL HIGH (ref 3.5–5.1)
Sodium: 135 mmol/L (ref 135–145)
Total Bilirubin: 0.7 mg/dL (ref 0.3–1.2)
Total Protein: 8.3 g/dL — ABNORMAL HIGH (ref 6.5–8.1)

## 2019-03-12 LAB — UA/M W/RFLX CULTURE, ROUTINE
Bilirubin, UA: NEGATIVE
Glucose, UA: NEGATIVE
Ketones, UA: NEGATIVE
Leukocytes,UA: NEGATIVE
Nitrite, UA: NEGATIVE
Protein,UA: NEGATIVE
RBC, UA: NEGATIVE
Specific Gravity, UA: 1.01 (ref 1.005–1.030)
Urobilinogen, Ur: 0.2 mg/dL (ref 0.2–1.0)
pH, UA: 5 (ref 5.0–7.5)

## 2019-03-12 LAB — LIPASE, BLOOD: Lipase: 36 U/L (ref 11–51)

## 2019-03-12 LAB — MICROSCOPIC EXAMINATION
Bacteria, UA: NONE SEEN
Casts: NONE SEEN /lpf
Epithelial Cells (non renal): NONE SEEN /hpf (ref 0–10)
RBC, Urine: NONE SEEN /hpf (ref 0–2)

## 2019-03-12 LAB — GLUCOSE, CAPILLARY
Glucose-Capillary: 192 mg/dL — ABNORMAL HIGH (ref 70–99)
Glucose-Capillary: 200 mg/dL — ABNORMAL HIGH (ref 70–99)

## 2019-03-12 LAB — SARS CORONAVIRUS 2 BY RT PCR (HOSPITAL ORDER, PERFORMED IN ~~LOC~~ HOSPITAL LAB): SARS Coronavirus 2: NEGATIVE

## 2019-03-12 MED ORDER — IPRATROPIUM-ALBUTEROL 0.5-2.5 (3) MG/3ML IN SOLN: 3.0000 mL | Freq: Four times a day (QID) | RESPIRATORY_TRACT | Status: DC | PRN

## 2019-03-12 MED ORDER — INSULIN ASPART 100 UNIT/ML ~~LOC~~ SOLN
0.0000 [IU] | SUBCUTANEOUS | Status: DC
  Administered 2019-03-12: 3 [IU] via SUBCUTANEOUS
  Administered 2019-03-13 (×2): 2 [IU] via SUBCUTANEOUS
  Administered 2019-03-15 (×2): 100 [IU] via SUBCUTANEOUS
  Administered 2019-03-16: 5 [IU] via SUBCUTANEOUS
  Administered 2019-03-16: 09:00:00 2 [IU] via SUBCUTANEOUS
  Administered 2019-03-16: 3 [IU] via SUBCUTANEOUS
  Filled 2019-03-12 (×8): qty 1

## 2019-03-12 MED ORDER — MOMETASONE FURO-FORMOTEROL FUM 200-5 MCG/ACT IN AERO
2.0000 | INHALATION_SPRAY | Freq: Two times a day (BID) | RESPIRATORY_TRACT | Status: DC
  Administered 2019-03-13 – 2019-03-16 (×7): 2 via RESPIRATORY_TRACT
  Filled 2019-03-12 (×2): qty 8.8

## 2019-03-12 MED ORDER — ONDANSETRON 4 MG PO TBDP
4.0000 mg | ORAL_TABLET | Freq: Once | ORAL | Status: AC
  Administered 2019-03-12: 16:00:00 4 mg via ORAL
  Filled 2019-03-12: qty 1

## 2019-03-12 MED ORDER — HYDRALAZINE HCL 20 MG/ML IJ SOLN: 5.0000 mg | Freq: Three times a day (TID) | INTRAMUSCULAR | Status: DC | PRN

## 2019-03-12 MED ORDER — SODIUM CHLORIDE 0.9 % IV SOLN
INTRAVENOUS | Status: DC
  Administered 2019-03-12 – 2019-03-14 (×6): via INTRAVENOUS

## 2019-03-12 MED ORDER — ENOXAPARIN SODIUM 40 MG/0.4ML ~~LOC~~ SOLN
40.0000 mg | SUBCUTANEOUS | Status: DC
  Administered 2019-03-12 – 2019-03-15 (×4): 40 mg via SUBCUTANEOUS
  Filled 2019-03-12 (×4): qty 0.4

## 2019-03-12 MED ORDER — ONDANSETRON HCL 4 MG/2ML IJ SOLN: 4.0000 mg | Freq: Four times a day (QID) | INTRAMUSCULAR | Status: DC | PRN

## 2019-03-12 MED ORDER — KETOROLAC TROMETHAMINE 15 MG/ML IJ SOLN
15.0000 mg | Freq: Four times a day (QID) | INTRAMUSCULAR | Status: DC | PRN
  Filled 2019-03-12: qty 1

## 2019-03-12 MED ORDER — IOHEXOL 300 MG/ML  SOLN
100.0000 mL | Freq: Once | INTRAMUSCULAR | Status: AC | PRN
  Administered 2019-03-12: 17:00:00 100 mL via INTRAVENOUS

## 2019-03-12 MED ORDER — PANTOPRAZOLE SODIUM 40 MG IV SOLR
40.0000 mg | Freq: Every day | INTRAVENOUS | Status: DC
  Administered 2019-03-12 – 2019-03-15 (×4): 40 mg via INTRAVENOUS
  Filled 2019-03-12 (×4): qty 40

## 2019-03-12 MED ORDER — HYDROMORPHONE HCL 1 MG/ML IJ SOLN: 0.5000 mg | INTRAMUSCULAR | Status: DC | PRN

## 2019-03-12 MED ORDER — ONDANSETRON 4 MG PO TBDP: 4.0000 mg | ORAL_TABLET | Freq: Four times a day (QID) | ORAL | Status: DC | PRN

## 2019-03-12 MED ORDER — FLUTICASONE PROPIONATE 50 MCG/ACT NA SUSP
2.0000 | Freq: Every day | NASAL | Status: DC
  Administered 2019-03-13 – 2019-03-16 (×4): 2 via NASAL
  Filled 2019-03-12 (×2): qty 16

## 2019-03-12 NOTE — ED Notes (Signed)
Spoke with patient's spouse to conduct home medication history. She requested that she be notified by phone when patient moved to inpatient unit and also requested information on best way to bring patient change of clothes.   ** The above is intended solely for informational and/or communicative purposes. It should in no way be considered an endorsement of any specific treatment, therapy or action. **

## 2019-03-12 NOTE — ED Triage Notes (Signed)
Pt arrives to ed from [facility]. States he was sent over due to dizziness.  Pt c/o abd pain, dizziness starting this am. Pt states he also has a headache. NAD noted at this time.

## 2019-03-12 NOTE — ED Notes (Signed)
Pt is now c/o nausea. Standing order for ODT zofran placed

## 2019-03-12 NOTE — H&P (Signed)
Date of Admission:  03/12/2019  Reason for Admission:  Small bowel obstruction  History of Present Illness: Daniel Hodges is a 78 y.o. male presenting of evaluation of abdominal pain, nausea, vomiting.  He reports doing well yesterday but this morning started having abdominal distention, pain, and has progressed to include nausea and vomiting.  Patient presented to the [facility] clinic today but was sent to the ED due to dizziness and concerns for presyncopal event.  ED workup included head CT which was negative for acute findings.  CT of abdomen and pelvis was also done which showed small bowel obstruction with transition point in the right lower quadrant.  Patient reports that he's been admitted before for SBO and required NG tube placement.  He reports having an open appendectomy, ventral hernia repair in [place] many years ago.  He also has medical comorbidities including HTN, CAD with MI s/p CABG x 4, prior SBO, DM, kidney disease.  His WBC is elevated to 14, with normal LFTs, elevated K to 5.2, and Cr 1.24.  U/A was negative and COVID test was negative.  Reports he's had emesis multiple times in the ED and soiled his facemask.  Past Medical History: Past Medical History:  Diagnosis Date  . Anginal pain ([place])   . Asthma   . Coronary artery disease   . Diabetes mellitus without complication ([place])   . Hyperlipidemia   . Hypertension   . Sleep apnea      Past Surgical History: Past Surgical History:  Procedure Laterality Date  . APPENDECTOMY    . COLONOSCOPY WITH PROPOFOL N/A 06/11/2015   Procedure: COLONOSCOPY WITH PROPOFOL;  Surgeon: Manya Silvas, MD;  Location: [facility] ENDOSCOPY;  Service: Endoscopy;  Laterality: N/A;  . CORONARY ARTERY BYPASS GRAFT    . HERNIA REPAIR    . TEE WITHOUT CARDIOVERSION    . TRACHEOSTOMY    . VASCULAR SURGERY      Home Medications: Prior to Admission medications   Medication Sig Start Date End Date Taking? Authorizing Provider  aspirin 81 MG  tablet Take 81 mg by mouth daily.   Yes [provider]  atorvastatin (LIPITOR) 20 MG tablet Take 1 tablet (20 mg total) by mouth at bedtime. 12/02/18  Yes Boscia, Greer Ee, NP  budesonide-formoterol (SYMBICORT) 160-4.5 MCG/ACT inhaler Inhale 2 puffs into the lungs 2 (two) times daily.    Yes [provider]  cholecalciferol (VITAMIN D3) 25 MCG (1000 UT) tablet Take 1,000 Units by mouth daily.   Yes [provider]  Dulaglutide (TRULICITY) 1.5 DG/6.4QI SOPN Inject 1.5 mg into the skin once a week. 11/30/18  Yes Scarboro, Audie Clear, NP  ferrous sulfate 325 (65 FE) MG tablet Take 1 tablet (325 mg total) by mouth 2 (two) times daily with a meal. 03/11/19  Yes Boscia, Heather E, NP  furosemide (LASIX) 40 MG tablet Take 0.5 tablets (20 mg total) by mouth daily. 11/26/18  Yes Boscia, Heather E, NP  gabapentin (NEURONTIN) 300 MG capsule TAKE 1 CAPSULE BY MOUTH DAILY AND 1 CAPSULE NIGHTLY Patient taking differently: Take 600 mg by mouth at bedtime.  02/02/19  Yes Boscia, Greer Ee, NP  HUMALOG KWIKPEN 100 UNIT/ML KwikPen Inject 6-10 Units into the skin See admin instructions. Inject 6u under the skin daily at breakfast-time, 8u at lunch-time and inject 10u under the skin daily at dinner-time 11/23/18  Yes [provider]  ipratropium-albuterol (DUONEB) 0.5-2.5 (3) MG/3ML SOLN USE 3 ML VIA NEBULIZER EVERY 6 HOURS AS NEEDED  Patient taking differently: Inhale 3 mLs into the lungs every 6 (six) hours as needed (shortness of breath).  12/02/18  Yes Scarboro, Audie Clear, NP  loratadine (CLARITIN) 10 MG tablet Take 1 tablet (10 mg total) by mouth daily. 01/12/19  Yes Ronnell Freshwater, NP  Magnesium 250 MG TABS Take 250 mg by mouth 2 (two) times daily.   Yes [provider]  metFORMIN (GLUMETZA) 1000 MG (MOD) 24 hr tablet Take 1 tablet (1,000 mg total) by mouth daily with breakfast. 10/13/18  Yes Boscia, Heather E, NP  mometasone (NASONEX) 50 MCG/ACT nasal spray Place 2 sprays into the  nose daily. 11/30/18  Yes Scarboro, Audie Clear, NP  montelukast (SINGULAIR) 10 MG tablet Take 1 tablet (10 mg total) by mouth daily. Patient taking differently: Take 10 mg by mouth at bedtime.  12/08/18  Yes Ronnell Freshwater, NP  Multiple Vitamin (MULTIVITAMIN WITH MINERALS) TABS tablet Take 1 tablet by mouth daily.   Yes [provider]  niacin 500 MG CR capsule Take 1 capsule (500 mg total) by mouth at bedtime. Patient taking differently: Take 500 mg by mouth daily.  03/11/19  Yes Ronnell Freshwater, NP  ONE TOUCH ULTRA TEST test strip USE THREE TIMES DAILY 02/18/18  Yes Ronnell Freshwater, NP  senna (SENOKOT) 8.6 MG tablet Take 1 tablet by mouth daily.   Yes [provider]  simethicone (MYLICON) 80 MG chewable tablet Chew 2 tablets (160 mg total) by mouth 2 (two) times daily. 01/12/19  Yes Boscia, Heather E, NP  TOUJEO SOLOSTAR 300 UNIT/ML SOPN Inject 25 Units into the skin at bedtime.  11/23/18  Yes [provider]  vitamin B-12 (CYANOCOBALAMIN) 1000 MCG tablet Take 1,000 mcg by mouth daily.   Yes [provider]    Allergies: Allergies  Allergen Reactions  . Penicillins Anaphylaxis  . Neostigmine     PEA arrest    Social History:  reports that he has never smoked. He has never used smokeless tobacco. He reports that he does not drink alcohol or use drugs.   Family History: Family History  Problem Relation Age of Onset  . Cancer Sister   . Diabetes Daughter   . Diabetes Son     Review of Systems: Review of Systems  Constitutional: Negative for chills and fever.  HENT: Negative for hearing loss.   Respiratory: Negative for shortness of breath.   Cardiovascular: Negative for chest pain.  Gastrointestinal: Positive for abdominal pain, nausea and vomiting. Negative for constipation and diarrhea.  Genitourinary: Negative for dysuria.  Musculoskeletal: Negative for myalgias.  Neurological: Negative for dizziness.  Psychiatric/Behavioral: Negative for  depression.    Physical Exam BP (!) 153/81 (BP Location: Right Arm)   Pulse (!) 110   Temp 97.8 F (36.6 C) (Oral)   Resp 18   Ht 5\' 6"  (1.676 m)   Wt 76.2 kg   SpO2 100%   BMI 27.12 kg/m  CONSTITUTIONAL: No acute distress, with very frequent hiccups. HEENT:  Normocephalic, atraumatic, extraocular motion intact. NECK: Trachea is midline, and there is no jugular venous distension.  RESPIRATORY:  Lungs are clear, and breath sounds are equal bilaterally. Normal respiratory effort without pathologic use of accessory muscles. CARDIOVASCULAR: Heart is regular without murmurs, gallops, or rubs. GI: The abdomen is soft, distended, with mild tenderness to palpation in the RLQ, non-peritoneal.  Prior appy and midline scars well healed without evidence of hernia at midline. MUSCULOSKELETAL:  Normal muscle strength and tone in all four  extremities.  No peripheral edema or cyanosis. SKIN: Skin turgor is normal. There are no pathologic skin lesions.  NEUROLOGIC:  Motor and sensation is grossly normal.  Cranial nerves are grossly intact. PSYCH:  Alert and oriented to person, place and time. Affect is normal.  Laboratory Analysis: Results for orders placed or performed during the hospital encounter of 03/12/19 (from the past 24 hour(s))  Lipase, blood     Status: None   Collection Time: 03/12/19 12:33 PM  Result Value Ref Range   Lipase 36 11 - 51 U/L  Comprehensive metabolic panel     Status: Abnormal   Collection Time: 03/12/19 12:33 PM  Result Value Ref Range   Sodium 135 135 - 145 mmol/L   Potassium 5.2 (H) 3.5 - 5.1 mmol/L   Chloride 100 98 - 111 mmol/L   CO2 24 22 - 32 mmol/L   Glucose, Bld 227 (H) 70 - 99 mg/dL   BUN 20 8 - 23 mg/dL   Creatinine, Ser 1.24 0.61 - 1.24 mg/dL   Calcium 9.2 8.9 - 10.3 mg/dL   Total Protein 8.3 (H) 6.5 - 8.1 g/dL   Albumin 4.3 3.5 - 5.0 g/dL   AST 23 15 - 41 U/L   ALT 22 0 - 44 U/L   Alkaline Phosphatase 111 38 - 126 U/L   Total Bilirubin 0.7 0.3 -  1.2 mg/dL   GFR calc non Af Amer 55 (L) >60 mL/min   GFR calc Af Amer >60 >60 mL/min   Anion gap 11 5 - 15  CBC     Status: Abnormal   Collection Time: 03/12/19 12:33 PM  Result Value Ref Range   WBC 14.0 (H) 4.0 - 10.5 K/uL   RBC 5.14 4.22 - 5.81 MIL/uL   Hemoglobin 13.8 13.0 - 17.0 g/dL   HCT 43.6 39.0 - 52.0 %   MCV 84.8 80.0 - 100.0 fL   MCH 26.8 26.0 - 34.0 pg   MCHC 31.7 30.0 - 36.0 g/dL   RDW 14.1 11.5 - 15.5 %   Platelets 239 150 - 400 K/uL   nRBC 0.0 0.0 - 0.2 %  Glucose, capillary     Status: Abnormal   Collection Time: 03/12/19  3:27 PM  Result Value Ref Range   Glucose-Capillary 192 (H) 70 - 99 mg/dL  Urinalysis, Complete w Microscopic     Status: Abnormal   Collection Time: 03/12/19  5:42 PM  Result Value Ref Range   Color, Urine YELLOW (A) YELLOW   APPearance CLEAR (A) CLEAR   Specific Gravity, Urine >1.046 (H) 1.005 - 1.030   pH 5.0 5.0 - 8.0   Glucose, UA NEGATIVE NEGATIVE mg/dL   Hgb urine dipstick NEGATIVE NEGATIVE   Bilirubin Urine NEGATIVE NEGATIVE   Ketones, ur NEGATIVE NEGATIVE mg/dL   Protein, ur NEGATIVE NEGATIVE mg/dL   Nitrite NEGATIVE NEGATIVE   Leukocytes,Ua NEGATIVE NEGATIVE   RBC / HPF 0-5 0 - 5 RBC/hpf   WBC, UA 0-5 0 - 5 WBC/hpf   Bacteria, UA NONE SEEN NONE SEEN   Squamous Epithelial / LPF 0-5 0 - 5   Mucus PRESENT   SARS Coronavirus 2 (CEPHEID - Performed in [place] hospital lab), Hosp Order     Status: None   Collection Time: 03/12/19  7:56 PM   Specimen: Nasopharyngeal Swab  Result Value Ref Range   SARS Coronavirus 2 NEGATIVE NEGATIVE  Glucose, capillary     Status: Abnormal   Collection Time: 03/12/19  9:11 PM  Result Value Ref Range   Glucose-Capillary 200 (H) 70 - 99 mg/dL    Imaging: Ct Head Wo Contrast  Result Date: 03/12/2019 CLINICAL DATA:  Dizziness. EXAM: CT HEAD WITHOUT CONTRAST TECHNIQUE: Contiguous axial images were obtained from the base of the skull through the vertex without intravenous contrast. COMPARISON:   CT scan dated 01/08/2008 FINDINGS: Brain: No evidence of acute infarction, hemorrhage, hydrocephalus, extra-axial collection or mass lesion/mass effect. There is diffuse mild atrophy with secondary slight dilatation of the ventricles. Vascular: No hyperdense vessel or unexpected calcification. Skull: Normal. Negative for fracture or focal lesion. Sinuses/Orbits: Normal. Other: None IMPRESSION: No acute intracranial abnormality. Mild diffuse atrophy with secondary dilatation of the ventricles primarily in the frontal horns. Electronically Signed   By: Lorriane Shire M.D.   On: 03/12/2019 17:26   Ct Abdomen Pelvis W Contrast  Result Date: 03/12/2019 CLINICAL DATA:  Abdominal pain, dizziness, nausea, and vomiting since this morning, history asthma, coronary artery disease, diabetes mellitus, hypertension EXAM: CT ABDOMEN AND PELVIS WITH CONTRAST TECHNIQUE: Multidetector CT imaging of the abdomen and pelvis was performed using the standard protocol following bolus administration of intravenous contrast. Sagittal and coronal MPR images reconstructed from axial data set. Patient developed nausea and vomiting following IV contrast administration. Renal delayed imaging could not be performed. CONTRAST:  147mL OMNIPAQUE IOHEXOL 300 MG/ML SOLN IV. No oral contrast. COMPARISON:  12/20/2017 FINDINGS: Lower chest: Minimal atelectasis or scarring at lung bases. Hepatobiliary: Gallbladder and liver normal appearance Pancreas: Normal appearance Spleen: Normal appearance Adrenals/Urinary Tract: Adrenal glands, kidneys, ureters, and bladder normal appearance Stomach/Bowel: Appendix surgically absent by history. Distal esophagus distended by fluid. Markedly distended stomach, proximal small bowel and mid small bowel containing fluid. Transition from dilated to nondilated small bowel occurs in the anterior RIGHT mid abdomen image 60 where a small bowel loop appears tethered to the anterior abdominal wall favor adhesion images 50  7-62. A small bowel stool sign and mild bowel wall thickening are seen immediately proximal to the point of obstruction. Distal small bowel loops appear decompressed. No obvious mass. Colon decompressed. Minimal sigmoid diverticulosis. Vascular/Lymphatic: Atherosclerotic calcifications aorta and coronary arteries. Aorta normal caliber. Vascular structures appear patent. No adenopathy. Reproductive: Prostatic enlargement, gland measuring 5.9 x 4.3 x 3.6 cm. Other: LEFT inguinal hernia containing fat and fluid. Small amount of ascites within the LEFT inguinal canal, pelvis, and inferior LEFT pericolic gutter. No free air. Musculoskeletal: No acute osseous findings. Scattered Schmorl's nodes lumbar spine. IMPRESSION: High-grade mid to distal small bowel obstruction likely due a to adhesion in the anterior RIGHT mid abdomen, with significant dilatation of proximal small bowel loops and stomach as well as distal esophagus. No evidence of perforation. Minimal free pelvic fluid. Prostatic enlargement. LEFT inguinal hernia containing fat and minimal fluid. Atherosclerotic disease calcifications including coronary arteries. Electronically Signed   By: [name] M.D.   On: 03/12/2019 17:34    Assessment and Plan: This is a 78 y.o. male with small bowel obstruction.  Discussed with the patient that we would admit him to the surgical team.  Given his comorbidities, will also have he ED consult the hospitalist team for medical management.  Will place NG tube in ED, keep NPO, start IV fluid hydration, and appropriate pain and nausea control.  Discussed that we would attempt conservative management for the next 2-3 days and hopefully not require surgery, but it may come to that if no improvement.  Patient understands and is in agreement.   Melvyn Neth, MD  [facility] Pg:  [phone]

## 2019-03-12 NOTE — ED Provider Notes (Addendum)
[facility] Emergency Department Provider Note  ____________________________________________   None    (approximate)  I have reviewed the triage vital signs and the nursing notes.   HISTORY  Chief Complaint Dizziness and Abdominal Pain   HPI Daniel Hodges is a 78 y.o. male who presents to the emergency department for treatment and evaluation of dizziness, abdominal pain, and headache. Symptoms started this morning around 6:30am after eating. He was able to have a small bowel movement. Since then, pain in the abdomen has worsened and he feels his stomach is swollen and hard. He had presented to [facility] acute care clinic for this, but then had an episode where he slumped over on the bench because of dizziness. He was then sent to the emergency department for further evaluation.       Past Medical History:  Diagnosis Date  . Anginal pain ([place])   . Asthma   . Coronary artery disease   . Diabetes mellitus without complication ([place])   . Hyperlipidemia   . Hypertension   . Sleep apnea     Patient Active Problem List   Diagnosis Date Noted  . Pleural effusion 08/06/2018  . Encounter for general adult medical examination with abnormal findings 03/20/2018  . Chronic otitis externa of both ears 03/20/2018  . Uncontrolled type 2 diabetes mellitus with hypoglycemia ([place]) 03/20/2018  . Dysuria 03/20/2018  . SOB (shortness of breath) 01/22/2018  . Cough 01/14/2018  . Constipation 12/30/2017  . Non-seasonal allergic rhinitis due to pollen 12/30/2017  . Dehydration 12/30/2017  . Essential hypertension 12/29/2017  . Uncontrolled type 2 diabetes mellitus with hyperglycemia ([place]) 12/27/2017  . Acute upper respiratory infection 12/27/2017  . Need for vaccination against Streptococcus pneumoniae using pneumococcal conjugate vaccine 13 12/27/2017  . Coronary artery disease due to lipid rich plaque 12/27/2017  . Mixed hyperlipidemia 12/27/2017  . AKI (acute kidney  injury) ([place]) 12/20/2017  . OSA on CPAP 02/01/2016  . Aortic ejection murmur 08/14/2015  . Tracheostomy in place [facility]) 09/22/2013  . Ileus ([place]) 09/20/2013  . Anemia 09/19/2013  . Hypercarbia 09/19/2013  . Postoperative anemia due to acute blood loss 09/06/2013  . Thrombocytopenia ([place]) 09/06/2013  . 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 Abnormal stress ECG 09/01/2013  . Asthma 09/01/2013  . S/P appendectomy 09/01/2013    Past Surgical History:  Procedure Laterality Date  . APPENDECTOMY    . COLONOSCOPY WITH PROPOFOL N/A 06/11/2015   Procedure: COLONOSCOPY WITH PROPOFOL;  Surgeon: Manya Silvas, MD;  Location: [facility] ENDOSCOPY;  Service: Endoscopy;  Laterality: N/A;  . CORONARY ARTERY BYPASS GRAFT    . HERNIA REPAIR    . TEE WITHOUT CARDIOVERSION    . TRACHEOSTOMY    . VASCULAR SURGERY      Prior to Admission medications   Medication Sig Start Date End Date Taking? Authorizing Provider  aspirin 81 MG tablet Take 81 mg by mouth daily.    [provider]  atorvastatin (LIPITOR) 20 MG tablet Take 1 tablet (20 mg total) by mouth at bedtime. 12/02/18   Ronnell Freshwater, NP  budesonide-formoterol (SYMBICORT) 160-4.5 MCG/ACT inhaler Inhale 2 puffs into the lungs daily.     [provider]  Dulaglutide (TRULICITY) 1.5 FA/2.1HY SOPN Inject 1.5 mg into the skin once a week. 11/30/18   Kendell Bane, NP  ferrous sulfate 325 (65 FE) MG tablet Take 1 tablet (325 mg total) by mouth 2 (two) times daily with a meal. 03/11/19  Ronnell Freshwater, NP  furosemide (LASIX) 40 MG tablet Take 0.5 tablets (20 mg total) by mouth daily. 11/26/18   Ronnell Freshwater, NP  gabapentin (NEURONTIN) 300 MG capsule TAKE 1 CAPSULE BY MOUTH DAILY AND 1 CAPSULE NIGHTLY 02/02/19   Ronnell Freshwater, NP  HUMALOG KWIKPEN 100 UNIT/ML KwikPen 6 units with breakfast, 8 units with lunch and 10 units with supper 11/23/18   [provider]  ipratropium-albuterol (DUONEB) 0.5-2.5 (3)  MG/3ML SOLN USE 3 ML VIA NEBULIZER EVERY 6 HOURS AS NEEDED 12/02/18   Kendell Bane, NP  loratadine (CLARITIN) 10 MG tablet Take 1 tablet (10 mg total) by mouth daily. 01/12/19   Ronnell Freshwater, NP  metFORMIN (GLUMETZA) 1000 MG (MOD) 24 hr tablet Take 1 tablet (1,000 mg total) by mouth daily with breakfast. 10/13/18   Boscia, Greer Ee, NP  mometasone (NASONEX) 50 MCG/ACT nasal spray Place 2 sprays into the nose daily. 11/30/18   [place], Audie Clear, NP  montelukast (SINGULAIR) 10 MG tablet Take 1 tablet (10 mg total) by mouth daily. 12/08/18   Ronnell Freshwater, NP  Multiple Vitamin (MULTIVITAMIN WITH MINERALS) TABS tablet Take 1 tablet by mouth daily.    [provider]  neomycin-polymyxin-hydrocortisone (CORTISPORIN) 3.5-10000-1 ophthalmic suspension Use 4 drops in both ears BID for 7 days as needed 02/26/18   Ronnell Freshwater, NP  niacin 500 MG CR capsule Take 1 capsule (500 mg total) by mouth at bedtime. 03/11/19   Ronnell Freshwater, NP  ONE TOUCH ULTRA TEST test strip USE THREE TIMES DAILY 02/18/18   Ronnell Freshwater, NP  polyethylene glycol (MIRALAX / GLYCOLAX) packet Take 17 g by mouth daily. 12/29/17   Ronnell Freshwater, NP  senna (SENOKOT) 8.6 MG tablet Take 1 tablet by mouth daily.    [provider]  simethicone (MYLICON) 80 MG chewable tablet Chew 2 tablets (160 mg total) by mouth 2 (two) times daily. 01/12/19   Boscia, Greer Ee, NP  TOUJEO SOLOSTAR 300 UNIT/ML SOPN Inject 25 Units into the skin daily.  11/23/18   [provider]    Allergies Penicillins and Neostigmine  Family History  Problem Relation Age of Onset  . Cancer Sister   . Diabetes Daughter   . Diabetes Son     Social History Social History   Tobacco Use  . Smoking status: Never Smoker  . Smokeless tobacco: Never Used  Substance Use Topics  . Alcohol use: No  . Drug use: No    Review of Systems  Constitutional: No fever/chills Eyes: No visual changes. ENT: No sore  throat. Cardiovascular: Denies chest pain. Respiratory: Denies shortness of breath. Gastrointestinal: No abdominal pain.  No nausea, no vomiting.  No diarrhea.  No constipation. Genitourinary: Negative for dysuria. Musculoskeletal: Negative for back pain. Skin: Negative for rash. Neurological: Negative for headaches, focal weakness or numbness. ____________________________________________   PHYSICAL EXAM:  VITAL SIGNS: ED Triage Vitals  Enc Vitals Group     BP 03/12/19 1212 122/66     Pulse Rate 03/12/19 1212 86     Resp 03/12/19 1212 18     Temp 03/12/19 1212 97.8 F (36.6 C)     Temp Source 03/12/19 1212 Oral     SpO2 03/12/19 1212 99 %     Weight 03/12/19 1230 168 lb (76.2 kg)     Height 03/12/19 1230 5\' 6"  (1.676 m)     Head Circumference --      Peak Flow --  Pain Score 03/12/19 1230 7     Pain Loc --      Pain Edu? --      Excl. in [place]? --     Constitutional: Alert and oriented. Well appearing and in no acute distress. Eyes: Conjunctivae are normal. PERRL. EOMI. Head: Atraumatic. Nose: No congestion/rhinnorhea. Mouth/Throat: Mucous membranes are moist.  Oropharynx non-erythematous. Neck: No stridor.   Cardiovascular: Normal rate, regular rhythm. Grossly normal heart sounds.  Good peripheral circulation. Respiratory: Normal respiratory effort.  No retractions. Lungs CTAB. Gastrointestinal: Firm, distended.  Active bowel sounds heard throughout. Musculoskeletal: No lower extremity tenderness nor edema.  No joint effusions. Neurologic:  Normal speech and language. No gross focal neurologic deficits are appreciated. No gait instability. Skin:  Skin is warm, dry and intact. No rash noted. Psychiatric: Mood and affect are normal. Speech and behavior are normal.  ____________________________________________   LABS (all labs ordered are listed, but only abnormal results are displayed)  Labs Reviewed  COMPREHENSIVE METABOLIC PANEL - Abnormal; Notable for the  following components:      Result Value   Potassium 5.2 (*)    Glucose, Bld 227 (*)    Total Protein 8.3 (*)    GFR calc non Af Amer 55 (*)    All other components within normal limits  CBC - Abnormal; Notable for the following components:   WBC 14.0 (*)    All other components within normal limits  URINALYSIS, COMPLETE (UACMP) WITH MICROSCOPIC - Abnormal; Notable for the following components:   Color, Urine YELLOW (*)    APPearance CLEAR (*)    Specific Gravity, Urine >1.046 (*)    All other components within normal limits  GLUCOSE, CAPILLARY - Abnormal; Notable for the following components:   Glucose-Capillary 192 (*)    All other components within normal limits  SARS CORONAVIRUS 2 (HOSPITAL ORDER, [place] [facility])  LIPASE, BLOOD   ____________________________________________  EKG  ED ECG REPORT I, Deadrick Stidd, FNP-BC personally viewed and interpreted this ECG.   Date: 03/12/2019  EKG Time: 1225  Rate: 84  Rhythm: normal EKG, normal sinus rhythm, unchanged from previous tracings  Axis: left  Intervals:none  ST&T Change: no ST elevation  ____________________________________________  RADIOLOGY  ED MD interpretation:    CT of the abdomen and pelvis shows a high-grade mid to distal small bowel obstruction likely secondary to an adhesion in the right midabdomen.  He has some significant dilatation of the proximal small bowel loops and stomach as well as distal esophagus.  CT of the head is negative for acute findings.  Official radiology report(s): Ct Head Wo Contrast  Result Date: 03/12/2019 CLINICAL DATA:  Dizziness. EXAM: CT HEAD WITHOUT CONTRAST TECHNIQUE: Contiguous axial images were obtained from the base of the skull through the vertex without intravenous contrast. COMPARISON:  CT scan dated 01/08/2008 FINDINGS: Brain: No evidence of acute infarction, hemorrhage, hydrocephalus, extra-axial collection or mass lesion/mass effect. There is  diffuse mild atrophy with secondary slight dilatation of the ventricles. Vascular: No hyperdense vessel or unexpected calcification. Skull: Normal. Negative for fracture or focal lesion. Sinuses/Orbits: Normal. Other: None IMPRESSION: No acute intracranial abnormality. Mild diffuse atrophy with secondary dilatation of the ventricles primarily in the frontal horns. Electronically Signed   By: Lorriane Shire M.D.   On: 03/12/2019 17:26   Ct Abdomen Pelvis W Contrast  Result Date: 03/12/2019 CLINICAL DATA:  Abdominal pain, dizziness, nausea, and vomiting since this morning, history asthma, coronary artery disease, diabetes mellitus, hypertension  EXAM: CT ABDOMEN AND PELVIS WITH CONTRAST TECHNIQUE: Multidetector CT imaging of the abdomen and pelvis was performed using the standard protocol following bolus administration of intravenous contrast. Sagittal and coronal MPR images reconstructed from axial data set. Patient developed nausea and vomiting following IV contrast administration. Renal delayed imaging could not be performed. CONTRAST:  187mL OMNIPAQUE IOHEXOL 300 MG/ML SOLN IV. No oral contrast. COMPARISON:  12/20/2017 FINDINGS: Lower chest: Minimal atelectasis or scarring at lung bases. Hepatobiliary: Gallbladder and liver normal appearance Pancreas: Normal appearance Spleen: Normal appearance Adrenals/Urinary Tract: Adrenal glands, kidneys, ureters, and bladder normal appearance Stomach/Bowel: Appendix surgically absent by history. Distal esophagus distended by fluid. Markedly distended stomach, proximal small bowel and mid small bowel containing fluid. Transition from dilated to nondilated small bowel occurs in the anterior RIGHT mid abdomen image 60 where a small bowel loop appears tethered to the anterior abdominal wall favor adhesion images 50 7-62. A small bowel stool sign and mild bowel wall thickening are seen immediately proximal to the point of obstruction. Distal small bowel loops appear  decompressed. No obvious mass. Colon decompressed. Minimal sigmoid diverticulosis. Vascular/Lymphatic: Atherosclerotic calcifications aorta and coronary arteries. Aorta normal caliber. Vascular structures appear patent. No adenopathy. Reproductive: Prostatic enlargement, gland measuring 5.9 x 4.3 x 3.6 cm. Other: LEFT inguinal hernia containing fat and fluid. Small amount of ascites within the LEFT inguinal canal, pelvis, and inferior LEFT pericolic gutter. No free air. Musculoskeletal: No acute osseous findings. Scattered Schmorl's nodes lumbar spine. IMPRESSION: High-grade mid to distal small bowel obstruction likely due a to adhesion in the anterior RIGHT mid abdomen, with significant dilatation of proximal small bowel loops and stomach as well as distal esophagus. No evidence of perforation. Minimal free pelvic fluid. Prostatic enlargement. LEFT inguinal hernia containing fat and minimal fluid. Atherosclerotic disease calcifications including coronary arteries. Electronically Signed   By: [name] M.D.   On: 03/12/2019 17:34    ____________________________________________   PROCEDURES  Procedure(s) performed (including Critical Care):  Procedures   ____________________________________________   INITIAL IMPRESSION / ASSESSMENT AND PLAN / ED COURSE  As part of my medical decision making, I reviewed the following data within the [place] note from today's visit at [facility].     78 year old male presenting to the emergency department for abdominal pain and lightheadedness.  He appears uncomfortable.  He has frequent hiccups/belching.  Abdomen is firm, but bowel sounds are present throughout.  Vital signs are reassuring.  Patient is afebrile and not tachycardic.  Plan will be to do CT of the abdomen and pelvis with contrast.  ----------------------------------------- 6:19 PM on 03/12/2019 -----------------------------------------  Dr. Hampton Abbot has been paged  to discuss findings of small bowel obstruction.   ----------------------------------------- 6:35 PM on 03/12/2019 -----------------------------------------  Dr. Hampton Abbot in the department.  Patient discussed and CT reviewed with him.  Plan will be to place a 22 French NG tube and obviously bring him in for an admission.  The above was discussed with the patient as well and he agrees to the plan.   ----------------------------------------- 7:09 PM on 03/12/2019 -----------------------------------------  Dr. Bridgett Larsson notified of patient and agrees to help with medical management. ___________________________________________   FINAL CLINICAL IMPRESSION(S) / ED DIAGNOSES  Final diagnoses:  Small bowel obstruction due to adhesions [facility])     ED Discharge Orders    None       Note:  This document was prepared using Dragon voice recognition software and may include unintentional dictation errors.    Yahya Boldman  B, FNP 03/12/19 1847    Victorino Dike, FNP 03/12/19 1910    Arta Silence, MD 03/12/19 2334

## 2019-03-12 NOTE — ED Notes (Signed)
ED TO INPATIENT HANDOFF REPORT  ED Nurse Name and Phone #:  Maudie Mercury [phone]  S Name/Age/Gender Daniel Hodges 78 y.o. male Room/Bed: ED13A/ED13A  Code Status   Code Status: Full Code  Home/SNF/Other Home Patient oriented to: self, place, time and situation Is this baseline? Yes   Triage Complete: Triage complete  Chief Complaint dizzy,abd pain  Triage Note Pt arrives to ed from [place] [facility]. States he was sent over due to dizziness.  Pt c/o abd pain, dizziness starting this am. Pt states he also has a headache. NAD noted at this time.   Allergies Allergies  Allergen Reactions  . Penicillins Anaphylaxis  . Neostigmine     PEA arrest    Level of Care/Admitting Diagnosis ED Disposition    ED Disposition Condition [facility] Area: [place] [[id number]]  Level of Care: Med-Surg [16]  Covid Evaluation: N/A  Diagnosis: SBO (small bowel obstruction) [facility]) [[id number]]  Admitting Physician: Olean Ree [[id number]]  Attending Physician: Olean Ree [[id number]]  Estimated length of stay: 3 - 4 days  Certification:: I certify this patient will need inpatient services for at least 2 midnights  PT Class (Do Not Modify): Inpatient [101]  PT Acc Code (Do Not Modify): Private [1]       B Medical/Surgery History Past Medical History:  Diagnosis Date  . Anginal pain ([place])   . Asthma   . Coronary artery disease   . Diabetes mellitus without complication ([place])   . Hyperlipidemia   . Hypertension   . Sleep apnea    Past Surgical History:  Procedure Laterality Date  . APPENDECTOMY    . COLONOSCOPY WITH PROPOFOL N/A 06/11/2015   Procedure: COLONOSCOPY WITH PROPOFOL;  Surgeon: Manya Silvas, MD;  Location: [facility] ENDOSCOPY;  Service: Endoscopy;  Laterality: N/A;  . CORONARY ARTERY BYPASS GRAFT    . HERNIA REPAIR    . TEE WITHOUT CARDIOVERSION    . TRACHEOSTOMY    . VASCULAR SURGERY       A IV Location/Drains/Wounds Patient  Lines/Drains/Airways Status   Active Line/Drains/Airways    Name:   Placement date:   Placement time:   Site:   Days:   Peripheral IV 03/12/19 Left Hand   03/12/19    1651    Hand   less than 1   NG/OG Tube Nasogastric 18 Fr. Left nare Aucultation 55 cm 55 cm   03/12/19    1853    Left nare   less than 1          Intake/Output Last 24 hours No intake or output data in the 24 hours ending 03/12/19 1909  Labs/Imaging Results for orders placed or performed during the hospital encounter of 03/12/19 (from the past 48 hour(s))  Lipase, blood     Status: None   Collection Time: 03/12/19 12:33 PM  Result Value Ref Range   Lipase 36 11 - 51 U/L    Comment: Performed at [facility], [address] [place],  [address]  Comprehensive metabolic panel     Status: Abnormal   Collection Time: 03/12/19 12:33 PM  Result Value Ref Range   Sodium 135 135 - 145 mmol/L   Potassium 5.2 (H) 3.5 - 5.1 mmol/L   Chloride 100 98 - 111 mmol/L   CO2 24 22 - 32 mmol/L   Glucose, Bld 227 (H) 70 - 99 mg/dL   BUN 20 8 - 23 mg/dL   Creatinine, Ser 1.24 0.61 -  1.24 mg/dL   Calcium 9.2 8.9 - 10.3 mg/dL   Total Protein 8.3 (H) 6.5 - 8.1 g/dL   Albumin 4.3 3.5 - 5.0 g/dL   AST 23 15 - 41 U/L   ALT 22 0 - 44 U/L   Alkaline Phosphatase 111 38 - 126 U/L   Total Bilirubin 0.7 0.3 - 1.2 mg/dL   GFR calc non Af Amer 55 (L) >60 mL/min   GFR calc Af Amer >60 >60 mL/min   Anion gap 11 5 - 15    Comment: Performed at [facility], [address] [place], [place] [address]  CBC     Status: Abnormal   Collection Time: 03/12/19 12:33 PM  Result Value Ref Range   WBC 14.0 (H) 4.0 - 10.5 K/uL   RBC 5.14 4.22 - 5.81 MIL/uL   Hemoglobin 13.8 13.0 - 17.0 g/dL   HCT 43.6 39.0 - 52.0 %   MCV 84.8 80.0 - 100.0 fL   MCH 26.8 26.0 - 34.0 pg   MCHC 31.7 30.0 - 36.0 g/dL   RDW 14.1 11.5 - 15.5 %   Platelets 239 150 - 400 K/uL   nRBC 0.0 0.0 - 0.2 %    Comment: Performed at [facility],  [address] [place], [place] [address]  Glucose, capillary     Status: Abnormal   Collection Time: 03/12/19  3:27 PM  Result Value Ref Range   Glucose-Capillary 192 (H) 70 - 99 mg/dL  Urinalysis, Complete w Microscopic     Status: Abnormal   Collection Time: 03/12/19  5:42 PM  Result Value Ref Range   Color, Urine YELLOW (A) YELLOW   APPearance CLEAR (A) CLEAR   Specific Gravity, Urine >1.046 (H) 1.005 - 1.030   pH 5.0 5.0 - 8.0   Glucose, UA NEGATIVE NEGATIVE mg/dL   Hgb urine dipstick NEGATIVE NEGATIVE   Bilirubin Urine NEGATIVE NEGATIVE   Ketones, ur NEGATIVE NEGATIVE mg/dL   Protein, ur NEGATIVE NEGATIVE mg/dL   Nitrite NEGATIVE NEGATIVE   Leukocytes,Ua NEGATIVE NEGATIVE   RBC / HPF 0-5 0 - 5 RBC/hpf   WBC, UA 0-5 0 - 5 WBC/hpf   Bacteria, UA NONE SEEN NONE SEEN   Squamous Epithelial / LPF 0-5 0 - 5   Mucus PRESENT     Comment: Performed at [facility], [address], [place],  [address]   Ct Head Wo Contrast  Result Date: 03/12/2019 CLINICAL DATA:  Dizziness. EXAM: CT HEAD WITHOUT CONTRAST TECHNIQUE: Contiguous axial images were obtained from the base of the skull through the vertex without intravenous contrast. COMPARISON:  CT scan dated 01/08/2008 FINDINGS: Brain: No evidence of acute infarction, hemorrhage, hydrocephalus, extra-axial collection or mass lesion/mass effect. There is diffuse mild atrophy with secondary slight dilatation of the ventricles. Vascular: No hyperdense vessel or unexpected calcification. Skull: Normal. Negative for fracture or focal lesion. Sinuses/Orbits: Normal. Other: None IMPRESSION: No acute intracranial abnormality. Mild diffuse atrophy with secondary dilatation of the ventricles primarily in the frontal horns. Electronically Signed   By: Lorriane Shire M.D.   On: 03/12/2019 17:26   Ct Abdomen Pelvis W Contrast  Result Date: 03/12/2019 CLINICAL DATA:  Abdominal pain, dizziness, nausea, and vomiting since this morning,  history asthma, coronary artery disease, diabetes mellitus, hypertension EXAM: CT ABDOMEN AND PELVIS WITH CONTRAST TECHNIQUE: Multidetector CT imaging of the abdomen and pelvis was performed using the standard protocol following bolus administration of intravenous contrast. Sagittal and coronal MPR images reconstructed from  axial data set. Patient developed nausea and vomiting following IV contrast administration. Renal delayed imaging could not be performed. CONTRAST:  171mL OMNIPAQUE IOHEXOL 300 MG/ML SOLN IV. No oral contrast. COMPARISON:  12/20/2017 FINDINGS: Lower chest: Minimal atelectasis or scarring at lung bases. Hepatobiliary: Gallbladder and liver normal appearance Pancreas: Normal appearance Spleen: Normal appearance Adrenals/Urinary Tract: Adrenal glands, kidneys, ureters, and bladder normal appearance Stomach/Bowel: Appendix surgically absent by history. Distal esophagus distended by fluid. Markedly distended stomach, proximal small bowel and mid small bowel containing fluid. Transition from dilated to nondilated small bowel occurs in the anterior RIGHT mid abdomen image 60 where a small bowel loop appears tethered to the anterior abdominal wall favor adhesion images 50 7-62. A small bowel stool sign and mild bowel wall thickening are seen immediately proximal to the point of obstruction. Distal small bowel loops appear decompressed. No obvious mass. Colon decompressed. Minimal sigmoid diverticulosis. Vascular/Lymphatic: Atherosclerotic calcifications aorta and coronary arteries. Aorta normal caliber. Vascular structures appear patent. No adenopathy. Reproductive: Prostatic enlargement, gland measuring 5.9 x 4.3 x 3.6 cm. Other: LEFT inguinal hernia containing fat and fluid. Small amount of ascites within the LEFT inguinal canal, pelvis, and inferior LEFT pericolic gutter. No free air. Musculoskeletal: No acute osseous findings. Scattered Schmorl's nodes lumbar spine. IMPRESSION: High-grade mid to  distal small bowel obstruction likely due a to adhesion in the anterior RIGHT mid abdomen, with significant dilatation of proximal small bowel loops and stomach as well as distal esophagus. No evidence of perforation. Minimal free pelvic fluid. Prostatic enlargement. LEFT inguinal hernia containing fat and minimal fluid. Atherosclerotic disease calcifications including coronary arteries. Electronically Signed   By: [name] M.D.   On: 03/12/2019 17:34    Pending Labs Unresulted Labs (From admission, onward)    Start     Ordered   03/13/19 4034  Basic metabolic panel  Tomorrow morning,   STAT     03/12/19 1851   03/13/19 0500  Magnesium  Tomorrow morning,   STAT     03/12/19 1851   03/13/19 0500  CBC WITH DIFFERENTIAL  Tomorrow morning,   STAT     03/12/19 1851   03/12/19 1851  Hemoglobin A1c  Once,   STAT    Comments: To assess prior glycemic control    03/12/19 1851   03/12/19 1850  Creatinine, serum  (enoxaparin (LOVENOX)    CrCl >/= 30 ml/min)  Once,   STAT    Comments: Baseline for enoxaparin therapy IF NOT ALREADY DRAWN.    03/12/19 1851   03/12/19 1819  SARS Coronavirus 2 (CEPHEID - Performed in [place] hospital lab), Hosp Order  (Asymptomatic Patients Labs)  ONCE - STAT,   STAT    Question:  Rule Out  Answer:  Yes   03/12/19 1818          Vitals/Pain Today's Vitals   03/12/19 1212 03/12/19 1230 03/12/19 1908  BP: 122/66  (!) 145/72  Pulse: 86  (!) 106  Resp: 18  18  Temp: 97.8 F (36.6 C)    TempSrc: Oral    SpO2: 99%  96%  Weight:  76.2 kg   Height:  5\' 6"  (1.676 m)   PainSc:  7      Isolation Precautions No active isolations  Medications Medications  HYDROmorphone (DILAUDID) injection 0.5 mg (has no administration in time range)  ondansetron (ZOFRAN-ODT) disintegrating tablet 4 mg (has no administration in time range)    Or  ondansetron (ZOFRAN) injection 4 mg (has no  administration in time range)  pantoprazole (PROTONIX) injection 40 mg (has no  administration in time range)  enoxaparin (LOVENOX) injection 40 mg (has no administration in time range)  0.9 %  sodium chloride infusion (has no administration in time range)  ketorolac (TORADOL) 15 MG/ML injection 15 mg (has no administration in time range)  insulin aspart (novoLOG) injection 0-15 Units (has no administration in time range)  hydrALAZINE (APRESOLINE) injection 5 mg (has no administration in time range)  ipratropium-albuterol (DUONEB) 0.5-2.5 (3) MG/3ML nebulizer solution 3 mL (has no administration in time range)  fluticasone (FLONASE) 50 MCG/ACT nasal spray 2 spray (has no administration in time range)  mometasone-formoterol (DULERA) 200-5 MCG/ACT inhaler 2 puff (has no administration in time range)  ondansetron (ZOFRAN-ODT) disintegrating tablet 4 mg (4 mg Oral Given 03/12/19 1617)  iohexol (OMNIPAQUE) 300 MG/ML solution 100 mL (100 mLs Intravenous Contrast Given 03/12/19 1701)    Mobility walks Low fall risk   Focused Assessments Pulmonary Assessment Handoff:  Lung sounds:   O2 Device: Room Air      R Recommendations: See Admitting Provider Note  Report given to:   Additional Notes:

## 2019-03-12 NOTE — Consult Note (Signed)
[facility] NAME: Daniel Hodges    MR#:  [id number]  DATE OF BIRTH:  September 17, 1940  DATE OF ADMISSION:  03/12/2019  PRIMARY CARE PHYSICIAN: Lavera Guise, MD   REQUESTING/REFERRING PHYSICIAN: Victorino Dike, FNP  CHIEF COMPLAINT:   Chief Complaint  Patient presents with  . Dizziness  . Abdominal Pain   Consultation for medical management. HISTORY OF PRESENT ILLNESS:  Daniel Hodges  is a 78 y.o. male with a known history of asthma, CAD, hypertension, diabetes, hyperlipidemia and sleep apnea.  The patient presents the ED with abdominal pain, nausea and vomiting.  He is found small bowel obstruction and admitted under surgical service.  On-call surgeon request medical management.  The patient still has abdominal pain and distention.  He denies any fever or chills.  He is on NGT with suction. PAST MEDICAL HISTORY:   Past Medical History:  Diagnosis Date  . Anginal pain ([place])   . Asthma   . Coronary artery disease   . Diabetes mellitus without complication ([place])   . Hyperlipidemia   . Hypertension   . Sleep apnea     PAST SURGICAL HISTORY:   Past Surgical History:  Procedure Laterality Date  . APPENDECTOMY    . COLONOSCOPY WITH PROPOFOL N/A 06/11/2015   Procedure: COLONOSCOPY WITH PROPOFOL;  Surgeon: Manya Silvas, MD;  Location: [facility] ENDOSCOPY;  Service: Endoscopy;  Laterality: N/A;  . CORONARY ARTERY BYPASS GRAFT    . HERNIA REPAIR    . TEE WITHOUT CARDIOVERSION    . TRACHEOSTOMY    . VASCULAR SURGERY      SOCIAL HISTORY:   Social History   Tobacco Use  . Smoking status: Never Smoker  . Smokeless tobacco: Never Used  Substance Use Topics  . Alcohol use: No    FAMILY HISTORY:   Family History  Problem Relation Age of Onset  . Cancer Sister   . Diabetes Daughter   . Diabetes Son     DRUG ALLERGIES:   Allergies  Allergen Reactions  . Penicillins Anaphylaxis  . Neostigmine     PEA arrest     REVIEW OF SYSTEMS:   Review of Systems  Constitutional: Positive for malaise/fatigue. Negative for chills and fever.  HENT: Negative for sore throat.   Eyes: Negative for blurred vision and double vision.  Respiratory: Negative for cough, hemoptysis, shortness of breath, wheezing and stridor.   Cardiovascular: Negative for chest pain, palpitations, orthopnea and leg swelling.  Gastrointestinal: Positive for abdominal pain, constipation, nausea and vomiting. Negative for blood in stool, diarrhea and melena.       Abdominal distention.  Genitourinary: Negative for dysuria, flank pain and hematuria.  Musculoskeletal: Negative for back pain and joint pain.  Neurological: Negative for dizziness, sensory change, focal weakness, seizures, loss of consciousness, weakness and headaches.  Endo/Heme/Allergies: Negative for polydipsia.  Psychiatric/Behavioral: Negative for depression. The patient is not nervous/anxious.     MEDICATIONS AT HOME:   Prior to Admission medications   Medication Sig Start Date End Date Taking? Authorizing Provider  aspirin 81 MG tablet Take 81 mg by mouth daily.   Yes [provider]  atorvastatin (LIPITOR) 20 MG tablet Take 1 tablet (20 mg total) by mouth at bedtime. 12/02/18  Yes Boscia, Greer Ee, NP  budesonide-formoterol (SYMBICORT) 160-4.5 MCG/ACT inhaler Inhale 2 puffs into the lungs 2 (two) times daily.    Yes [provider]  cholecalciferol (VITAMIN D3) 25 MCG (1000  UT) tablet Take 1,000 Units by mouth daily.   Yes [provider]  Dulaglutide (TRULICITY) 1.5 UQ/3.3HL SOPN Inject 1.5 mg into the skin once a week. 11/30/18  Yes Scarboro, Audie Clear, NP  ferrous sulfate 325 (65 FE) MG tablet Take 1 tablet (325 mg total) by mouth 2 (two) times daily with a meal. 03/11/19  Yes Boscia, Heather E, NP  furosemide (LASIX) 40 MG tablet Take 0.5 tablets (20 mg total) by mouth daily. 11/26/18  Yes Boscia, Heather E, NP  gabapentin (NEURONTIN) 300 MG  capsule TAKE 1 CAPSULE BY MOUTH DAILY AND 1 CAPSULE NIGHTLY Patient taking differently: Take 600 mg by mouth at bedtime.  02/02/19  Yes Boscia, Greer Ee, NP  HUMALOG KWIKPEN 100 UNIT/ML KwikPen Inject 6-10 Units into the skin See admin instructions. Inject 6u under the skin daily at breakfast-time, 8u at lunch-time and inject 10u under the skin daily at dinner-time 11/23/18  Yes [provider]  ipratropium-albuterol (DUONEB) 0.5-2.5 (3) MG/3ML SOLN USE 3 ML VIA NEBULIZER EVERY 6 HOURS AS NEEDED Patient taking differently: Inhale 3 mLs into the lungs every 6 (six) hours as needed (shortness of breath).  12/02/18  Yes Scarboro, Audie Clear, NP  loratadine (CLARITIN) 10 MG tablet Take 1 tablet (10 mg total) by mouth daily. 01/12/19  Yes Ronnell Freshwater, NP  Magnesium 250 MG TABS Take 250 mg by mouth 2 (two) times daily.   Yes [provider]  metFORMIN (GLUMETZA) 1000 MG (MOD) 24 hr tablet Take 1 tablet (1,000 mg total) by mouth daily with breakfast. 10/13/18  Yes Boscia, Heather E, NP  mometasone (NASONEX) 50 MCG/ACT nasal spray Place 2 sprays into the nose daily. 11/30/18  Yes Scarboro, Audie Clear, NP  montelukast (SINGULAIR) 10 MG tablet Take 1 tablet (10 mg total) by mouth daily. Patient taking differently: Take 10 mg by mouth at bedtime.  12/08/18  Yes Ronnell Freshwater, NP  Multiple Vitamin (MULTIVITAMIN WITH MINERALS) TABS tablet Take 1 tablet by mouth daily.   Yes [provider]  niacin 500 MG CR capsule Take 1 capsule (500 mg total) by mouth at bedtime. Patient taking differently: Take 500 mg by mouth daily.  03/11/19  Yes Ronnell Freshwater, NP  ONE TOUCH ULTRA TEST test strip USE THREE TIMES DAILY 02/18/18  Yes Ronnell Freshwater, NP  senna (SENOKOT) 8.6 MG tablet Take 1 tablet by mouth daily.   Yes [provider]  simethicone (MYLICON) 80 MG chewable tablet Chew 2 tablets (160 mg total) by mouth 2 (two) times daily. 01/12/19  Yes Boscia, Heather E, NP  TOUJEO SOLOSTAR  300 UNIT/ML SOPN Inject 25 Units into the skin at bedtime.  11/23/18  Yes [provider]  vitamin B-12 (CYANOCOBALAMIN) 1000 MCG tablet Take 1,000 mcg by mouth daily.   Yes [provider]      VITAL SIGNS:  Blood pressure (!) 153/81, pulse (!) 110, temperature 97.8 F (36.6 C), temperature source Oral, resp. rate 18, height 5\' 6"  (1.676 m), weight 76.2 kg, SpO2 100 %.  PHYSICAL EXAMINATION:  Physical Exam  GENERAL:  78 y.o.-year-old patient lying in the bed with no acute distress.  EYES: Pupils equal, round, reactive to light and accommodation. No scleral icterus. Extraocular muscles intact.  HEENT: Head atraumatic, normocephalic. Oropharynx and nasopharynx clear.  NECK:  Supple, no jugular venous distention. No thyroid enlargement, no tenderness.  LUNGS: Normal breath sounds bilaterally, no wheezing, rales,rhonchi or crepitation. No use of accessory muscles of respiration.  CARDIOVASCULAR: S1, S2 normal. No murmurs, rubs, or gallops.  ABDOMEN: Diffuse abdominal tenderness and highly distended.  Hypoactive bowel sounds.  Unable to estimate organomegaly or mass.  EXTREMITIES: No pedal edema, cyanosis, or clubbing.  NEUROLOGIC: Cranial nerves II through XII are intact. Muscle strength 5/5 in all extremities. Sensation intact. Gait not checked.  PSYCHIATRIC: The patient is alert and oriented x 3.  SKIN: No obvious rash, lesion, or ulcer.   LABORATORY PANEL:   CBC Recent Labs  Lab 03/12/19 1233  WBC 14.0*  HGB 13.8  HCT 43.6  PLT 239   ------------------------------------------------------------------------------------------------------------------  Chemistries  Recent Labs  Lab 03/12/19 1233  NA 135  K 5.2*  CL 100  CO2 24  GLUCOSE 227*  BUN 20  CREATININE 1.24  CALCIUM 9.2  AST 23  ALT 22  ALKPHOS 111  BILITOT 0.7   ------------------------------------------------------------------------------------------------------------------  Cardiac  Enzymes No results for input(s): TROPONINI in the last 168 hours. ------------------------------------------------------------------------------------------------------------------  RADIOLOGY:  Ct Head Wo Contrast  Result Date: 03/12/2019 CLINICAL DATA:  Dizziness. EXAM: CT HEAD WITHOUT CONTRAST TECHNIQUE: Contiguous axial images were obtained from the base of the skull through the vertex without intravenous contrast. COMPARISON:  CT scan dated 01/08/2008 FINDINGS: Brain: No evidence of acute infarction, hemorrhage, hydrocephalus, extra-axial collection or mass lesion/mass effect. There is diffuse mild atrophy with secondary slight dilatation of the ventricles. Vascular: No hyperdense vessel or unexpected calcification. Skull: Normal. Negative for fracture or focal lesion. Sinuses/Orbits: Normal. Other: None IMPRESSION: No acute intracranial abnormality. Mild diffuse atrophy with secondary dilatation of the ventricles primarily in the frontal horns. Electronically Signed   By: Lorriane Shire M.D.   On: 03/12/2019 17:26   Ct Abdomen Pelvis W Contrast  Result Date: 03/12/2019 CLINICAL DATA:  Abdominal pain, dizziness, nausea, and vomiting since this morning, history asthma, coronary artery disease, diabetes mellitus, hypertension EXAM: CT ABDOMEN AND PELVIS WITH CONTRAST TECHNIQUE: Multidetector CT imaging of the abdomen and pelvis was performed using the standard protocol following bolus administration of intravenous contrast. Sagittal and coronal MPR images reconstructed from axial data set. Patient developed nausea and vomiting following IV contrast administration. Renal delayed imaging could not be performed. CONTRAST:  143mL OMNIPAQUE IOHEXOL 300 MG/ML SOLN IV. No oral contrast. COMPARISON:  12/20/2017 FINDINGS: Lower chest: Minimal atelectasis or scarring at lung bases. Hepatobiliary: Gallbladder and liver normal appearance Pancreas: Normal appearance Spleen: Normal appearance Adrenals/Urinary Tract:  Adrenal glands, kidneys, ureters, and bladder normal appearance Stomach/Bowel: Appendix surgically absent by history. Distal esophagus distended by fluid. Markedly distended stomach, proximal small bowel and mid small bowel containing fluid. Transition from dilated to nondilated small bowel occurs in the anterior RIGHT mid abdomen image 60 where a small bowel loop appears tethered to the anterior abdominal wall favor adhesion images 50 7-62. A small bowel stool sign and mild bowel wall thickening are seen immediately proximal to the point of obstruction. Distal small bowel loops appear decompressed. No obvious mass. Colon decompressed. Minimal sigmoid diverticulosis. Vascular/Lymphatic: Atherosclerotic calcifications aorta and coronary arteries. Aorta normal caliber. Vascular structures appear patent. No adenopathy. Reproductive: Prostatic enlargement, gland measuring 5.9 x 4.3 x 3.6 cm. Other: LEFT inguinal hernia containing fat and fluid. Small amount of ascites within the LEFT inguinal canal, pelvis, and inferior LEFT pericolic gutter. No free air. Musculoskeletal: No acute osseous findings. Scattered Schmorl's nodes lumbar spine. IMPRESSION: High-grade mid to distal small bowel obstruction likely due a to adhesion in the anterior RIGHT mid abdomen, with significant dilatation of proximal small bowel loops  and stomach as well as distal esophagus. No evidence of perforation. Minimal free pelvic fluid. Prostatic enlargement. LEFT inguinal hernia containing fat and minimal fluid. Atherosclerotic disease calcifications including coronary arteries. Electronically Signed   By: [name] M.D.   On: 03/12/2019 17:34      IMPRESSION AND PLAN:   Small bowel obstruction. N.p.o. with NGT suction, IV fluid support, pain control and follow-up surgeon.  Hyperkalemia.  IV fluid support and follow-up potassium. Leukocytosis, possible due to reaction, follow-up CBC. Diabetes.  Start sliding scale. Hypertension.   Hold hypertension medication due to n.p.o. status, IV hydralazine PRN.  All the records are reviewed and case discussed with ED provider. Management plans discussed with the patient, family and they are in agreement.  CODE STATUS: Full code.  TOTAL TIME TAKING CARE OF THIS PATIENT:43 minutes.    [name] M.D on 03/12/2019 at 8:37 PM  Between 7am to 6pm - Pager - [phone]  After 6pm go to [email or web address] - password [email or web address] [facility]  [facility]  Office  [phone]  CC: Primary care physician; Lavera Guise, MD   Note: This dictation was prepared with Dragon dictation along with smaller phrase technology. Any transcriptional errors that result from this process are unin

## 2019-03-12 NOTE — Progress Notes (Signed)
Advanced Care Plan.  Purpose of Encounter: CODE STATUS. Parties in Attendance: The patient and me. Patient's Decisional Capacity: Yes. Medical Story: Daniel Hodges  is a 78 y.o. male with a known history of asthma, CAD, hypertension, diabetes, hyperlipidemia and sleep apnea.  Patient is being admitted for small bowel obstruction.  I discussed with patient about his current condition, prognosis and CODE STATUS.  The patient hesitated to decide whether he wants to be resuscitated.  Or intubated if he has cardiopulmonary rest.  He said he will talk with his wife.  Plan:  Code Status: Full code for now. Time spent discussing advance care planning: 17 minutes.

## 2019-03-13 ENCOUNTER — Inpatient Hospital Stay: Payer: Medicare Other

## 2019-03-13 LAB — CBC WITH DIFFERENTIAL/PLATELET
Abs Immature Granulocytes: 0.04 10*3/uL (ref 0.00–0.07)
Basophils Absolute: 0 10*3/uL (ref 0.0–0.1)
Basophils Relative: 0 %
Eosinophils Absolute: 0 10*3/uL (ref 0.0–0.5)
Eosinophils Relative: 0 %
HCT: 40.5 % (ref 39.0–52.0)
Hemoglobin: 12.8 g/dL — ABNORMAL LOW (ref 13.0–17.0)
Immature Granulocytes: 0 %
Lymphocytes Relative: 6 %
Lymphs Abs: 0.6 10*3/uL — ABNORMAL LOW (ref 0.7–4.0)
MCH: 26.9 pg (ref 26.0–34.0)
MCHC: 31.6 g/dL (ref 30.0–36.0)
MCV: 85.1 fL (ref 80.0–100.0)
Monocytes Absolute: 1.3 10*3/uL — ABNORMAL HIGH (ref 0.1–1.0)
Monocytes Relative: 12 %
Neutro Abs: 9 10*3/uL — ABNORMAL HIGH (ref 1.7–7.7)
Neutrophils Relative %: 82 %
Platelets: 208 10*3/uL (ref 150–400)
RBC: 4.76 MIL/uL (ref 4.22–5.81)
RDW: 13.9 % (ref 11.5–15.5)
WBC: 11 10*3/uL — ABNORMAL HIGH (ref 4.0–10.5)
nRBC: 0 % (ref 0.0–0.2)

## 2019-03-13 LAB — GLUCOSE, CAPILLARY
Glucose-Capillary: 102 mg/dL — ABNORMAL HIGH (ref 70–99)
Glucose-Capillary: 130 mg/dL — ABNORMAL HIGH (ref 70–99)
Glucose-Capillary: 131 mg/dL — ABNORMAL HIGH (ref 70–99)
Glucose-Capillary: 143 mg/dL — ABNORMAL HIGH (ref 70–99)
Glucose-Capillary: 72 mg/dL (ref 70–99)
Glucose-Capillary: 79 mg/dL (ref 70–99)
Glucose-Capillary: 99 mg/dL (ref 70–99)

## 2019-03-13 LAB — BASIC METABOLIC PANEL
Anion gap: 10 (ref 5–15)
BUN: 26 mg/dL — ABNORMAL HIGH (ref 8–23)
CO2: 25 mmol/L (ref 22–32)
Calcium: 8.6 mg/dL — ABNORMAL LOW (ref 8.9–10.3)
Chloride: 100 mmol/L (ref 98–111)
Creatinine, Ser: 1.46 mg/dL — ABNORMAL HIGH (ref 0.61–1.24)
GFR calc Af Amer: 53 mL/min — ABNORMAL LOW (ref >60)
GFR calc non Af Amer: 45 mL/min — ABNORMAL LOW (ref >60)
Glucose, Bld: 139 mg/dL — ABNORMAL HIGH (ref 70–99)
Potassium: 5.3 mmol/L — ABNORMAL HIGH (ref 3.5–5.1)
Sodium: 135 mmol/L (ref 135–145)

## 2019-03-13 LAB — HEMOGLOBIN A1C
Hgb A1c MFr Bld: 6.8 % — ABNORMAL HIGH (ref 4.8–5.6)
Mean Plasma Glucose: 148.46 mg/dL

## 2019-03-13 LAB — MAGNESIUM: Magnesium: 2.2 mg/dL (ref 1.7–2.4)

## 2019-03-13 MED ORDER — HYDROCORTISONE 1 % EX CREA
TOPICAL_CREAM | Freq: Two times a day (BID) | CUTANEOUS | Status: DC
  Administered 2019-03-13 – 2019-03-15 (×6): via TOPICAL
  Filled 2019-03-13: qty 28

## 2019-03-13 MED ORDER — SODIUM CHLORIDE 0.9 % IV BOLUS
500.0000 mL | Freq: Once | INTRAVENOUS | Status: AC
  Administered 2019-03-13: 09:00:00 500 mL via INTRAVENOUS

## 2019-03-13 MED ORDER — DEXTROSE 50 % IV SOLN
25.0000 mL | Freq: Once | INTRAVENOUS | Status: AC
  Administered 2019-03-13: 25 mL via INTRAVENOUS

## 2019-03-13 MED ORDER — DEXTROSE 50 % IV SOLN
INTRAVENOUS | Status: AC
  Filled 2019-03-13: qty 50

## 2019-03-13 NOTE — Progress Notes (Signed)
03/13/2019  Subjective: No acute events.  Patient feeling better. Denies any nausea with NG tube.  No flatus yet.  High output from NG.  Vital signs: Temp:  [98 F (36.7 C)-98.7 F (37.1 C)] 98.7 F (37.1 C) (06/28 1213) Pulse Rate:  [73-110] 73 (06/28 1213) Resp:  [16-18] 16 (06/28 1213) BP: (109-153)/(60-81) 109/60 (06/28 1213) SpO2:  [96 %-100 %] 100 % (06/28 1213)   Intake/Output: 06/27 0701 - 06/28 0700 In: -  Out: 700 [Emesis/NG output:700]    Physical Exam: Constitutional: No acute distress Abdomen:  Soft, distended, nontender to palpation.  NG tube in place.  Labs:  Recent Labs    03/12/19 1233 03/13/19 0551  WBC 14.0* 11.0*  HGB 13.8 12.8*  HCT 43.6 40.5  PLT 239 208   Recent Labs    03/12/19 1233 03/13/19 0551  NA 135 135  K 5.2* 5.3*  CL 100 100  CO2 24 25  GLUCOSE 227* 139*  BUN 20 26*  CREATININE 1.24 1.46*  CALCIUM 9.2 8.6*   No results for input(s): LABPROT, INR in the last 72 hours.  Imaging: Dg Abd 1 View  Result Date: 03/13/2019 CLINICAL DATA:  Small-bowel obstruction EXAM: ABDOMEN - 1 VIEW COMPARISON:  Plain film of the abdomen dated 03/12/2019. CT abdomen dated 03/12/2019. FINDINGS: Persistent gaseous distension of small bowel loops within the abdomen and upper pelvis, mild to moderate in degree, perhaps mildly improved compared to the CT abdomen of 03/12/2019. Enteric tube within the stomach. No evidence of soft tissue mass or abnormal fluid collection. No evidence of free intraperitoneal air. IMPRESSION: Persistent evidence of small bowel obstruction, mild to moderate in degree, perhaps mildly improved compared to CT abdomen of 03/12/2019. Electronically Signed   By: Franki Cabot M.D.   On: 03/13/2019 06:59   Dg Abd 1 View  Result Date: 03/12/2019 CLINICAL DATA:  NG tube placement EXAM: ABDOMEN - 1 VIEW COMPARISON:  None. FINDINGS: The NG tube projects over the gastric body. Again noted are dilated loops of small bowel in the abdomen.  IMPRESSION: NG tube projects over the patient's gastric body. Electronically Signed   By: [name] M.D.   On: 03/12/2019 22:32    Assessment/Plan: This is a 78 y.o. male with SBO  --continue NPO with IV fluid hydration, awaiting return of bowel function. --repeat labs in [place], [place] [facility]

## 2019-03-13 NOTE — Progress Notes (Signed)
[place] at [place] NAME: Daniel Hodges    MR#:  [id number]  DATE OF BIRTH:  [birth year]  SUBJECTIVE:   Patient admitted to the hospital secondary abdominal pain and nausea noted to have partial small bowel obstruction.  Hospitalist services contacted for medical management.  Patient still complaining of some abdominal pain and intermittent nausea.  No other acute complaints or events overnight.  REVIEW OF SYSTEMS:    Review of Systems  Constitutional: Negative for chills and fever.  HENT: Negative for congestion and tinnitus.   Eyes: Negative for blurred vision and double vision.  Respiratory: Negative for cough, shortness of breath and wheezing.   Cardiovascular: Negative for chest pain, orthopnea and PND.  Gastrointestinal: Positive for abdominal pain. Negative for diarrhea, nausea and vomiting.  Genitourinary: Negative for dysuria and hematuria.  Neurological: Negative for dizziness, sensory change and focal weakness.  All other systems reviewed and are negative.   Nutrition: NPO Tolerating Diet: No Tolerating PT: Await Eval.   DRUG ALLERGIES:   Allergies  Allergen Reactions  . Penicillins Anaphylaxis  . Neostigmine     PEA arrest    VITALS:  Blood pressure 109/60, pulse 73, temperature 98.7 F (37.1 C), temperature source Oral, resp. rate 16, height 5\' 6"  (1.676 m), weight 76.2 kg, SpO2 100 %.  PHYSICAL EXAMINATION:   Physical Exam  GENERAL:  78 y.o.-year-old patient lying in bed in no acute distress.  EYES: Pupils equal, round, reactive to light and accommodation. No scleral icterus. Extraocular muscles intact.  HEENT: Head atraumatic, normocephalic. NG tube in place with bilious drainage noted.  NECK:  Supple, no jugular venous distention. No thyroid enlargement, no tenderness.  LUNGS: Normal breath sounds bilaterally, no wheezing, rales, rhonchi. No use of accessory muscles of respiration.  CARDIOVASCULAR: S1,  S2 normal. No murmurs, rubs, or gallops.  ABDOMEN: Soft, nontender, nondistended. Bowel sounds present. No organomegaly or mass. Mid-abdomen scar with previous surgery.   EXTREMITIES: No cyanosis, clubbing or edema b/l.    NEUROLOGIC: Cranial nerves II through XII are intact. No focal Motor or sensory deficits b/l.   PSYCHIATRIC: The patient is alert and oriented x 3.  SKIN: No obvious rash, lesion, or ulcer.    LABORATORY PANEL:   CBC Recent Labs  Lab 03/13/19 0551  WBC 11.0*  HGB 12.8*  HCT 40.5  PLT 208   ------------------------------------------------------------------------------------------------------------------  Chemistries  Recent Labs  Lab 03/12/19 1233 03/13/19 0551  NA 135 135  K 5.2* 5.3*  CL 100 100  CO2 24 25  GLUCOSE 227* 139*  BUN 20 26*  CREATININE 1.24 1.46*  CALCIUM 9.2 8.6*  MG  --  2.2  AST 23  --   ALT 22  --   ALKPHOS 111  --   BILITOT 0.7  --    ------------------------------------------------------------------------------------------------------------------  Cardiac Enzymes No results for input(s): TROPONINI in the last 168 hours. ------------------------------------------------------------------------------------------------------------------  RADIOLOGY:  Dg Abd 1 View  Result Date: 03/13/2019 CLINICAL DATA:  Small-bowel obstruction EXAM: ABDOMEN - 1 VIEW COMPARISON:  Plain film of the abdomen dated 03/12/2019. CT abdomen dated 03/12/2019. FINDINGS: Persistent gaseous distension of small bowel loops within the abdomen and upper pelvis, mild to moderate in degree, perhaps mildly improved compared to the CT abdomen of 03/12/2019. Enteric tube within the stomach. No evidence of soft tissue mass or abnormal fluid collection. No evidence of free intraperitoneal air. IMPRESSION: Persistent evidence of small bowel obstruction, mild to moderate in degree,  perhaps mildly improved compared to CT abdomen of 03/12/2019. Electronically Signed   By:  Franki Cabot M.D.   On: 03/13/2019 06:59   Dg Abd 1 View  Result Date: 03/12/2019 CLINICAL DATA:  NG tube placement EXAM: ABDOMEN - 1 VIEW COMPARISON:  None. FINDINGS: The NG tube projects over the gastric body. Again noted are dilated loops of small bowel in the abdomen. IMPRESSION: NG tube projects over the patient's gastric body. Electronically Signed   By: [name] M.D.   On: 03/12/2019 22:32   Ct Head Wo Contrast  Result Date: 03/12/2019 CLINICAL DATA:  Dizziness. EXAM: CT HEAD WITHOUT CONTRAST TECHNIQUE: Contiguous axial images were obtained from the base of the skull through the vertex without intravenous contrast. COMPARISON:  CT scan dated 01/08/2008 FINDINGS: Brain: No evidence of acute infarction, hemorrhage, hydrocephalus, extra-axial collection or mass lesion/mass effect. There is diffuse mild atrophy with secondary slight dilatation of the ventricles. Vascular: No hyperdense vessel or unexpected calcification. Skull: Normal. Negative for fracture or focal lesion. Sinuses/Orbits: Normal. Other: None IMPRESSION: No acute intracranial abnormality. Mild diffuse atrophy with secondary dilatation of the ventricles primarily in the frontal horns. Electronically Signed   By: Lorriane Shire M.D.   On: 03/12/2019 17:26   Ct Abdomen Pelvis W Contrast  Result Date: 03/12/2019 CLINICAL DATA:  Abdominal pain, dizziness, nausea, and vomiting since this morning, history asthma, coronary artery disease, diabetes mellitus, hypertension EXAM: CT ABDOMEN AND PELVIS WITH CONTRAST TECHNIQUE: Multidetector CT imaging of the abdomen and pelvis was performed using the standard protocol following bolus administration of intravenous contrast. Sagittal and coronal MPR images reconstructed from axial data set. Patient developed nausea and vomiting following IV contrast administration. Renal delayed imaging could not be performed. CONTRAST:  130mL OMNIPAQUE IOHEXOL 300 MG/ML SOLN IV. No oral contrast.  COMPARISON:  12/20/2017 FINDINGS: Lower chest: Minimal atelectasis or scarring at lung bases. Hepatobiliary: Gallbladder and liver normal appearance Pancreas: Normal appearance Spleen: Normal appearance Adrenals/Urinary Tract: Adrenal glands, kidneys, ureters, and bladder normal appearance Stomach/Bowel: Appendix surgically absent by history. Distal esophagus distended by fluid. Markedly distended stomach, proximal small bowel and mid small bowel containing fluid. Transition from dilated to nondilated small bowel occurs in the anterior RIGHT mid abdomen image 60 where a small bowel loop appears tethered to the anterior abdominal wall favor adhesion images 50 7-62. A small bowel stool sign and mild bowel wall thickening are seen immediately proximal to the point of obstruction. Distal small bowel loops appear decompressed. No obvious mass. Colon decompressed. Minimal sigmoid diverticulosis. Vascular/Lymphatic: Atherosclerotic calcifications aorta and coronary arteries. Aorta normal caliber. Vascular structures appear patent. No adenopathy. Reproductive: Prostatic enlargement, gland measuring 5.9 x 4.3 x 3.6 cm. Other: LEFT inguinal hernia containing fat and fluid. Small amount of ascites within the LEFT inguinal canal, pelvis, and inferior LEFT pericolic gutter. No free air. Musculoskeletal: No acute osseous findings. Scattered Schmorl's nodes lumbar spine. IMPRESSION: High-grade mid to distal small bowel obstruction likely due a to adhesion in the anterior RIGHT mid abdomen, with significant dilatation of proximal small bowel loops and stomach as well as distal esophagus. No evidence of perforation. Minimal free pelvic fluid. Prostatic enlargement. LEFT inguinal hernia containing fat and minimal fluid. Atherosclerotic disease calcifications including coronary arteries. Electronically Signed   By: [name] M.D.   On: 03/12/2019 17:34     ASSESSMENT AND PLAN:   78 year old male with past medical history of  coronary artery disease status post previous bypass, previous history of small bowel obstruction,  struct of sleep apnea, diabetes, hypertension, hyperlipidemia who presented to the hospital due to abdominal pain and noted to have a small bowel obstruction.  1.  Small bowel obstruction-this is a source of patient's abdominal pain nausea vomiting. - Patient admitted to the surgical service and status post NG tube decompression. - Abdominal x-ray still showing evidence of SBO today.  Continue supportive care IV fluids, antiemetics and NG tube as mentioned.  Further care as per general surgery.  2.  Hyperkalemia-mild, no acute EKG changes. -We will repeat potassium in the morning.  3.  Diabetes type 2 with neuropathy- continue sliding scale insulin for now.  Hold patient's metformin, Toujeo.  4.  COPD-no acute exacerbation-continue duo nebs as needed. - cont. Dulera.   5. Hyperlipidemia - will reorder atorvastatin once pt. Can take PO.   All the records are reviewed and case discussed with Care Management/Social Worker. Management plans discussed with the patient, family and they are in agreement.  CODE STATUS: Full code  DVT Prophylaxis: Lovenox  TOTAL TIME TAKING CARE OF THIS PATIENT: 30 minutes.   POSSIBLE D/C unclear DAYS, DEPENDING ON CLINICAL CONDITION and progress.   Henreitta Leber M.D on 03/13/2019 at 2:49 PM  Between 7am to 6pm - Pager - [phone]  After 6pm go to [email or web address] - password [email or web address] [facility]  Office  [phone]  CC: Primary care physician; Lavera Guise, MD

## 2019-03-14 ENCOUNTER — Inpatient Hospital Stay: Payer: Medicare Other

## 2019-03-14 LAB — BASIC METABOLIC PANEL
Anion gap: 6 (ref 5–15)
BUN: 26 mg/dL — ABNORMAL HIGH (ref 8–23)
CO2: 24 mmol/L (ref 22–32)
Calcium: 8 mg/dL — ABNORMAL LOW (ref 8.9–10.3)
Chloride: 110 mmol/L (ref 98–111)
Creatinine, Ser: 1.33 mg/dL — ABNORMAL HIGH (ref 0.61–1.24)
GFR calc Af Amer: 59 mL/min — ABNORMAL LOW (ref >60)
GFR calc non Af Amer: 51 mL/min — ABNORMAL LOW (ref >60)
Glucose, Bld: 85 mg/dL (ref 70–99)
Potassium: 4.5 mmol/L (ref 3.5–5.1)
Sodium: 140 mmol/L (ref 135–145)

## 2019-03-14 LAB — GLUCOSE, CAPILLARY
Glucose-Capillary: 111 mg/dL — ABNORMAL HIGH (ref 70–99)
Glucose-Capillary: 118 mg/dL — ABNORMAL HIGH (ref 70–99)
Glucose-Capillary: 64 mg/dL — ABNORMAL LOW (ref 70–99)
Glucose-Capillary: 72 mg/dL (ref 70–99)
Glucose-Capillary: 79 mg/dL (ref 70–99)
Glucose-Capillary: 81 mg/dL (ref 70–99)
Glucose-Capillary: 85 mg/dL (ref 70–99)

## 2019-03-14 MED ORDER — KCL IN DEXTROSE-NACL 20-5-0.45 MEQ/L-%-% IV SOLN
INTRAVENOUS | Status: DC
  Administered 2019-03-14 – 2019-03-16 (×5): via INTRAVENOUS
  Filled 2019-03-14 (×5): qty 1000

## 2019-03-14 MED ORDER — DEXTROSE 50 % IV SOLN
12.5000 g | INTRAVENOUS | Status: AC
  Administered 2019-03-14: 17:00:00 12.5 g via INTRAVENOUS
  Filled 2019-03-14: qty 50

## 2019-03-14 MED ORDER — DIATRIZOATE MEGLUMINE & SODIUM 66-10 % PO SOLN
90.0000 mL | Freq: Once | ORAL | Status: AC
  Administered 2019-03-14: 13:00:00 90 mL via NASOGASTRIC

## 2019-03-14 NOTE — Progress Notes (Signed)
[place] at [place] NAME: Daniel Hodges    MR#:  [id number]  DATE OF BIRTH:  November 25, 1940  SUBJECTIVE:   Patient here due to small bowel obstruction.  Still having some mild abdominal pain plan for KUB with Gastrografin to assess today.  No nausea or vomiting.  No other acute events overnight.  Pt. Passing flatus today.   REVIEW OF SYSTEMS:    Review of Systems  Constitutional: Negative for chills and fever.  HENT: Negative for congestion and tinnitus.   Eyes: Negative for blurred vision and double vision.  Respiratory: Negative for cough, shortness of breath and wheezing.   Cardiovascular: Negative for chest pain, orthopnea and PND.  Gastrointestinal: Positive for abdominal pain. Negative for diarrhea, nausea and vomiting.  Genitourinary: Negative for dysuria and hematuria.  Neurological: Negative for dizziness, sensory change and focal weakness.  All other systems reviewed and are negative.   Nutrition: NPO Tolerating Diet: No Tolerating PT: Await Eval.   DRUG ALLERGIES:   Allergies  Allergen Reactions  . Penicillins Anaphylaxis  . Neostigmine     PEA arrest    VITALS:  Blood pressure 116/67, pulse 87, temperature 98.4 F (36.9 C), temperature source Oral, resp. rate 18, height 5\' 6"  (1.676 m), weight 76.2 kg, SpO2 99 %.  PHYSICAL EXAMINATION:   Physical Exam  GENERAL:  78 y.o.-year-old patient lying in bed in no acute distress.  EYES: Pupils equal, round, reactive to light and accommodation. No scleral icterus. Extraocular muscles intact.  HEENT: Head atraumatic, normocephalic. NG tube in place with bilious drainage noted.  NECK:  Supple, no jugular venous distention. No thyroid enlargement, no tenderness.  LUNGS: Normal breath sounds bilaterally, no wheezing, rales, rhonchi. No use of accessory muscles of respiration.  CARDIOVASCULAR: S1, S2 normal. No murmurs, rubs, or gallops.  ABDOMEN: Soft, slightly tender, no  rebound, rigidity, nondistended. Bowel sounds Hypoactive. No organomegaly or mass. Mid-abdomen scar with previous surgery.   EXTREMITIES: No cyanosis, clubbing or edema b/l.    NEUROLOGIC: Cranial nerves II through XII are intact. No focal Motor or sensory deficits b/l.   PSYCHIATRIC: The patient is alert and oriented x 3.  SKIN: No obvious rash, lesion, or ulcer.    LABORATORY PANEL:   CBC Recent Labs  Lab 03/13/19 0551  WBC 11.0*  HGB 12.8*  HCT 40.5  PLT 208   ------------------------------------------------------------------------------------------------------------------  Chemistries  Recent Labs  Lab 03/12/19 1233 03/13/19 0551 03/14/19 0347  NA 135 135 140  K 5.2* 5.3* 4.5  CL 100 100 110  CO2 24 25 24   GLUCOSE 227* 139* 85  BUN 20 26* 26*  CREATININE 1.24 1.46* 1.33*  CALCIUM 9.2 8.6* 8.0*  MG  --  2.2  --   AST 23  --   --   ALT 22  --   --   ALKPHOS 111  --   --   BILITOT 0.7  --   --    ------------------------------------------------------------------------------------------------------------------  Cardiac Enzymes No results for input(s): TROPONINI in the last 168 hours. ------------------------------------------------------------------------------------------------------------------  RADIOLOGY:  Dg Abd 1 View  Result Date: 03/13/2019 CLINICAL DATA:  Small-bowel obstruction EXAM: ABDOMEN - 1 VIEW COMPARISON:  Plain film of the abdomen dated 03/12/2019. CT abdomen dated 03/12/2019. FINDINGS: Persistent gaseous distension of small bowel loops within the abdomen and upper pelvis, mild to moderate in degree, perhaps mildly improved compared to the CT abdomen of 03/12/2019. Enteric tube within the stomach. No evidence of  soft tissue mass or abnormal fluid collection. No evidence of free intraperitoneal air. IMPRESSION: Persistent evidence of small bowel obstruction, mild to moderate in degree, perhaps mildly improved compared to CT abdomen of 03/12/2019.  Electronically Signed   By: Franki Cabot M.D.   On: 03/13/2019 06:59   Dg Abd 1 View  Result Date: 03/12/2019 CLINICAL DATA:  NG tube placement EXAM: ABDOMEN - 1 VIEW COMPARISON:  None. FINDINGS: The NG tube projects over the gastric body. Again noted are dilated loops of small bowel in the abdomen. IMPRESSION: NG tube projects over the patient's gastric body. Electronically Signed   By: [name] M.D.   On: 03/12/2019 22:32   Ct Head Wo Contrast  Result Date: 03/12/2019 CLINICAL DATA:  Dizziness. EXAM: CT HEAD WITHOUT CONTRAST TECHNIQUE: Contiguous axial images were obtained from the base of the skull through the vertex without intravenous contrast. COMPARISON:  CT scan dated 01/08/2008 FINDINGS: Brain: No evidence of acute infarction, hemorrhage, hydrocephalus, extra-axial collection or mass lesion/mass effect. There is diffuse mild atrophy with secondary slight dilatation of the ventricles. Vascular: No hyperdense vessel or unexpected calcification. Skull: Normal. Negative for fracture or focal lesion. Sinuses/Orbits: Normal. Other: None IMPRESSION: No acute intracranial abnormality. Mild diffuse atrophy with secondary dilatation of the ventricles primarily in the frontal horns. Electronically Signed   By: Lorriane Shire M.D.   On: 03/12/2019 17:26   Ct Abdomen Pelvis W Contrast  Result Date: 03/12/2019 CLINICAL DATA:  Abdominal pain, dizziness, nausea, and vomiting since this morning, history asthma, coronary artery disease, diabetes mellitus, hypertension EXAM: CT ABDOMEN AND PELVIS WITH CONTRAST TECHNIQUE: Multidetector CT imaging of the abdomen and pelvis was performed using the standard protocol following bolus administration of intravenous contrast. Sagittal and coronal MPR images reconstructed from axial data set. Patient developed nausea and vomiting following IV contrast administration. Renal delayed imaging could not be performed. CONTRAST:  170mL OMNIPAQUE IOHEXOL 300 MG/ML SOLN  IV. No oral contrast. COMPARISON:  12/20/2017 FINDINGS: Lower chest: Minimal atelectasis or scarring at lung bases. Hepatobiliary: Gallbladder and liver normal appearance Pancreas: Normal appearance Spleen: Normal appearance Adrenals/Urinary Tract: Adrenal glands, kidneys, ureters, and bladder normal appearance Stomach/Bowel: Appendix surgically absent by history. Distal esophagus distended by fluid. Markedly distended stomach, proximal small bowel and mid small bowel containing fluid. Transition from dilated to nondilated small bowel occurs in the anterior RIGHT mid abdomen image 60 where a small bowel loop appears tethered to the anterior abdominal wall favor adhesion images 50 7-62. A small bowel stool sign and mild bowel wall thickening are seen immediately proximal to the point of obstruction. Distal small bowel loops appear decompressed. No obvious mass. Colon decompressed. Minimal sigmoid diverticulosis. Vascular/Lymphatic: Atherosclerotic calcifications aorta and coronary arteries. Aorta normal caliber. Vascular structures appear patent. No adenopathy. Reproductive: Prostatic enlargement, gland measuring 5.9 x 4.3 x 3.6 cm. Other: LEFT inguinal hernia containing fat and fluid. Small amount of ascites within the LEFT inguinal canal, pelvis, and inferior LEFT pericolic gutter. No free air. Musculoskeletal: No acute osseous findings. Scattered Schmorl's nodes lumbar spine. IMPRESSION: High-grade mid to distal small bowel obstruction likely due a to adhesion in the anterior RIGHT mid abdomen, with significant dilatation of proximal small bowel loops and stomach as well as distal esophagus. No evidence of perforation. Minimal free pelvic fluid. Prostatic enlargement. LEFT inguinal hernia containing fat and minimal fluid. Atherosclerotic disease calcifications including coronary arteries. Electronically Signed   By: [name] M.D.   On: 03/12/2019 17:34   Dg Abd Portable  1v-small Bowel Obstruction  Protocol-initial, 8 Hr Delay  Result Date: 03/14/2019 CLINICAL DATA:  Small-bowel obstruction EXAM: PORTABLE ABDOMEN - 1 VIEW COMPARISON:  Portable exam 1058 hours compared to 03/13/2019 FINDINGS: Persistent dilatation of small bowel loops in the mid and LEFT abdomen up to 5.2 cm transverse. Scattered small amounts of colonic gas are present. Minimal bowel wall thickening of a single small bowel loop in the LEFT mid abdomen. Tip of nasogastric tube is within a small hiatal hernia. Bones demineralized. IMPRESSION: Persistent small bowel obstruction. Electronically Signed   By: [name] M.D.   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  1.  Small bowel obstruction-this is a=the source of patient's abdominal pain nausea vomiting. - Patient admitted to the surgical service and status post NG tube decompression. -Plan for Gastrografin follow-through today.  Continue supportive care with IV fluids, NG tube decompression, antiemetics.  No plans for urgent surgical intervention as per surgery.  2.  Hyperkalemia-mild, no acute EKG changes. -Potassium level normalized today.  3.  Diabetes type 2 with neuropathy- continue sliding scale insulin for now.  Hold patient's metformin, Toujeo. -Blood sugar stable.  4.  COPD-no acute exacerbation-continue duo nebs as needed. - cont. Dulera.   5. Hyperlipidemia - will reorder atorvastatin once pt. Can take PO.   All the records are reviewed and case discussed with Care Management/Social Worker. Management plans discussed with the patient, family and they are in agreement.  CODE STATUS: Full code  DVT Prophylaxis: Lovenox  TOTAL TIME TAKING CARE OF  THIS PATIENT: 25 minutes.   POSSIBLE D/C unclear DAYS, DEPENDING ON CLINICAL CONDITION and progress.   Henreitta Leber M.D on 03/14/2019 at 2:54 PM  Between 7am to 6pm - Pager - [phone]  After 6pm go to [email or web address] - password [email or web address] [facility]  Office  [phone]  CC: Primary care physician; Lavera Guise, MD

## 2019-03-14 NOTE — Progress Notes (Signed)
Talked with patient's daughter, Maudie Mercury, at patient's request to update her on how things are going.  All questions answered and understanding verbalized.

## 2019-03-14 NOTE — Progress Notes (Signed)
[facility] SURGICAL PROGRESS NOTE (cpt [phone])  Hospital Day(s): 2.   Post op day(s):  Marland Kitchen   Interval History: Patient seen and examined, no acute events or new complaints overnight. Patient reports that his abdomen is feeling better "nutit feels like something is there." No reports of fever, chills, nausea, or emesis. He did note that he had a small bowel movement and has passed "some flatus" this morning. NGT with 350 ccs out yesterday per chart review. No other complaints.   Review of Systems:  Constitutional: denies fever, chills  HEENT: denies cough or congestion  Respiratory: denies any shortness of breath  Cardiovascular: denies chest pain or palpitations  Gastrointestinal: denies abdominal pain, N/V, or diarrhea/and bowel function as per interval history Genitourinary: denies burning with urination or urinary frequency   Vital signs in last 24 hours: [min-max] current  Temp:  [98 F (36.7 C)-98.7 F (37.1 C)] 98.6 F (37 C) (06/29 0516) Pulse Rate:  [73-91] 91 (06/29 0516) Resp:  [16-18] 17 (06/29 0516) BP: (109-143)/(60-76) 143/76 (06/29 0516) SpO2:  [99 %-100 %] 100 % (06/29 0516)     Height: 5\' 6"  (167.6 cm) Weight: 76.2 kg BMI (Calculated): 27.13   Intake/Output last 2 shifts:  06/28 0701 - 06/29 0700 In: 2843.9 [I.V.:2843.9] Out: 350 [Emesis/NG output:350]   Physical Exam:  Constitutional: alert, cooperative and no distress  HENT: normocephalic without obvious abnormality, NGT in place  Eyes: PERRL, EOM's grossly intact and symmetric  Respiratory: breathing non-labored at rest  Gastrointestinal: soft, mild tenderness epigastric and LUQ, mild distension, tympanic to percussion in epigastric and LUQ    Labs:  CBC Latest Ref Rng & Units 03/13/2019 03/12/2019 12/20/2017  WBC 4.0 - 10.5 K/uL 11.0(H) 14.0(H) 11.2(H)  Hemoglobin 13.0 - 17.0 g/dL 12.8(L) 13.8 13.0  Hematocrit 39.0 - 52.0 % 40.5 43.6 39.5(L)  Platelets 150 - 400 K/uL 208 239 231   CMP  Latest Ref Rng & Units 03/14/2019 03/13/2019 03/12/2019  Glucose 70 - 99 mg/dL 85 139(H) 227(H)  BUN 8 - 23 mg/dL 26(H) 26(H) 20  Creatinine 0.61 - 1.24 mg/dL 1.33(H) 1.46(H) 1.24  Sodium 135 - 145 mmol/L 140 135 135  Potassium 3.5 - 5.1 mmol/L 4.5 5.3(H) 5.2(H)  Chloride 98 - 111 mmol/L 110 100 100  CO2 22 - 32 mmol/L 24 25 24   Calcium 8.9 - 10.3 mg/dL 8.0(L) 8.6(L) 9.2  Total Protein 6.5 - 8.1 g/dL - - 8.3(H)  Total Bilirubin 0.3 - 1.2 mg/dL - - 0.7  Alkaline Phos 38 - 126 U/L - - 111  AST 15 - 41 U/L - - 23  ALT 0 - 44 U/L - - 22     Imaging studies: No new pertinent imaging studies   Assessment/Plan: (ICD-10's: K109.60) 78 y.o. male with partial small bowel obstruction.    - Continue NPO + IVF  - Continue NGT for decompression; monitor output  - Will obtain KUB this morning and give gastrografin and get 8-hour delay KUB   - pain control prn; antiemetics prn  - Monitor abdominal examination; on-going bowel function  - no indication for surgical intervention   - mobilization encouraged   - medical management of cormobidities   All of the above findings and recommendations were discussed with the patient, patient's family (wife via phone), and the medical team, and all of patient's and family's questions were answered to their expressed satisfaction.  -- Edison Simon, PA-C [facility] 03/14/2019, 11:05 AM [phone] M-F: 7am - 4pm

## 2019-03-15 LAB — GLUCOSE, CAPILLARY
Glucose-Capillary: 114 mg/dL — ABNORMAL HIGH (ref 70–99)
Glucose-Capillary: 117 mg/dL — ABNORMAL HIGH (ref 70–99)
Glucose-Capillary: 119 mg/dL — ABNORMAL HIGH (ref 70–99)
Glucose-Capillary: 136 mg/dL — ABNORMAL HIGH (ref 70–99)
Glucose-Capillary: 139 mg/dL — ABNORMAL HIGH (ref 70–99)
Glucose-Capillary: 224 mg/dL — ABNORMAL HIGH (ref 70–99)
Glucose-Capillary: 99 mg/dL (ref 70–99)

## 2019-03-15 LAB — BASIC METABOLIC PANEL
Anion gap: 6 (ref 5–15)
BUN: 17 mg/dL (ref 8–23)
CO2: 25 mmol/L (ref 22–32)
Calcium: 8.2 mg/dL — ABNORMAL LOW (ref 8.9–10.3)
Chloride: 109 mmol/L (ref 98–111)
Creatinine, Ser: 1.02 mg/dL (ref 0.61–1.24)
GFR calc Af Amer: >60 mL/min (ref >60)
GFR calc non Af Amer: >60 mL/min (ref >60)
Glucose, Bld: 146 mg/dL — ABNORMAL HIGH (ref 70–99)
Potassium: 4.6 mmol/L (ref 3.5–5.1)
Sodium: 140 mmol/L (ref 135–145)

## 2019-03-15 LAB — MAGNESIUM: Magnesium: 1.9 mg/dL (ref 1.7–2.4)

## 2019-03-15 MED ORDER — BOOST / RESOURCE BREEZE PO LIQD CUSTOM
1.0000 | Freq: Three times a day (TID) | ORAL | Status: DC
  Administered 2019-03-15: 15:00:00 via ORAL
  Administered 2019-03-15 – 2019-03-16 (×2): 1 via ORAL

## 2019-03-15 NOTE — Care Management Important Message (Signed)
Important Message  Patient Details  Name: Daniel Hodges MRN: [id number] Date of Birth: Jun 29, 1941   Medicare Important Message Given:  Yes  Initial Medicare IM given by Patient Access Associate on 03/14/2019 at 10:02am.  Still valid.     Sheyla Zaffino 03/15/2019, 8:16 AM

## 2019-03-15 NOTE — Progress Notes (Signed)
[facility] SUR[facility]ICAL PROGRESS NOTE (cpt [phone])  Hospital Day(s): 3.   Post op day(s):  Marland Kitchen   Interval History: Patient seen and examined, no acute events or new complaints overnight. Patient reports resolution in his abdominal discomfort. He continues to endorse flatus and bowel movements. KUB with gastrografin showed contrast reaching the rectum. NGT with only 150 ccs out. Otherwise no complaints.   Review of Systems:  Constitutional: denies fever, chills  HEENT: denies cough or congestion  Respiratory: denies any shortness of breath  Cardiovascular: denies chest pain or palpitations  Gastrointestinal: denies abdominal pain, N/V, or diarrhea/and bowel function as per interval history Genitourinary: denies burning with urination or urinary frequency   Vital signs in last 24 hours: [min-max] current  Temp:  [98.4 F (36.9 C)-98.6 F (37 C)] 98.6 F (37 C) (06/30 0412) Pulse Rate:  [77-87] 77 (06/30 0412) Resp:  [18] 18 (06/30 0412) BP: (116-128)/(67) 128/67 (06/30 0412) SpO2:  [99 %] 99 % (06/30 0412)     Height: 5\' 6"  (167.6 cm) Weight: 76.2 kg BMI (Calculated): 27.13   Intake/Output last 2 shifts:  06/29 0701 - 06/30 0700 In: 2694.8 [I.V.:2694.8] Out: 425 [Urine:275; Emesis/NG output:150]   Physical Exam:  Constitutional: alert, cooperative and no distress  HENT: normocephalic without obvious abnormality, NGT in place  Eyes: PERRL, EOM's grossly intact and symmetric  Respiratory: breathing non-labored at rest  Gastrointestinal: soft, non-tender, non-distended, no rebound/guarding   Labs:  CBC Latest Ref Rng & Units 03/13/2019 03/12/2019 12/20/2017  WBC 4.0 - 10.5 K/uL 11.0(H) 14.0(H) 11.2(H)  Hemoglobin 13.0 - 17.0 g/dL 12.8(L) 13.8 13.0  Hematocrit 39.0 - 52.0 % 40.5 43.6 39.5(L)  Platelets 150 - 400 K/uL 208 239 231   CMP Latest Ref Rng & Units 03/15/2019 03/14/2019 03/13/2019  Glucose 70 - 99 mg/dL 146(H) 85 139(H)  BUN 8 - 23 mg/dL 17 26(H) 26(H)   Creatinine 0.61 - 1.24 mg/dL 1.02 1.33(H) 1.46(H)  Sodium 135 - 145 mmol/L 140 140 135  Potassium 3.5 - 5.1 mmol/L 4.6 4.5 5.3(H)  Chloride 98 - 111 mmol/L 109 110 100  CO2 22 - 32 mmol/L 25 24 25   Calcium 8.9 - 10.3 mg/dL 8.2(L) 8.0(L) 8.6(L)  Total Protein 6.5 - 8.1 g/dL - - -  Total Bilirubin 0.3 - 1.2 mg/dL - - -  Alkaline Phos 38 - 126 U/L - - -  AST 15 - 41 U/L - - -  ALT 0 - 44 U/L - - -     Imaging studies:   KUB - 8 hour delay with gastrografin (03/14/2019) personally reviewed and contrast is reaching rectum, radiologist report reviewed below:  IMPRESSION: 1. Oral contrast is seen to the level of the rectum. 2. Persistent dilated loops of small bowel, somewhat improved from prior study.   Assessment/Plan: (ICD-10's: K67.51) 78 y.o. male with clinically improved partial small bowel obstruction , most likely attributable to post-surgical adhesions.    - Will remove NGT this afternoon  - Initiate clear liquid diet this afternoon; advance as tolerates   - pain control prn;              - Monitor abdominal examination; on-going bowel function             - no indication for surgical intervention              - mobilization encouraged              - medical management of cormobidities  All of the above findings and recommendations were discussed with the patient, and the medical team, and all of patient's questions were answered to his expressed satisfaction.  Will discuss with daughter via phone today as well.   -- Edison Simon, PA-C [facility] 03/15/2019, 9:51 AM [phone] M-F: 7am - 4pm

## 2019-03-15 NOTE — Progress Notes (Signed)
Spoke with daughter, Ulis Rias, regarding pt request to have outpatient labs drawn while inpatient, plan for discontinuing NGT and advancing to clear liquid as tolerated, pain management plan for intermittent abdominal pain. Daughter verbalized understanding and voiced concerns about pt's difficulty swallowing. No further questions, surgical team to contact with plan of care details.

## 2019-03-15 NOTE — Progress Notes (Signed)
[facility] NAME: Daniel Hodges    MR#:  [id number]  DATE OF BIRTH:  1941/07/10  SUBJECTIVE:   Patient's Gastrografin study from yesterday showed that contrast had passed all the way down to his large bowel.  He still complains of abdominal pain but no nausea or vomiting.  As per surgery plan on starting on clear liquids and removing NG tube today.  No other acute events overnight.  REVIEW OF SYSTEMS:    Review of Systems  Constitutional: Negative for chills and fever.  HENT: Negative for congestion and tinnitus.   Eyes: Negative for blurred vision and double vision.  Respiratory: Negative for cough, shortness of breath and wheezing.   Cardiovascular: Negative for chest pain, orthopnea and PND.  Gastrointestinal: Positive for abdominal pain. Negative for diarrhea, nausea and vomiting.  Genitourinary: Negative for dysuria and hematuria.  Neurological: Negative for dizziness, sensory change and focal weakness.  All other systems reviewed and are negative.   Nutrition: Clear liquids Tolerating Diet: Yes Tolerating PT: Await Eval.   DRUG ALLERGIES:   Allergies  Allergen Reactions  . Penicillins Anaphylaxis  . Neostigmine     PEA arrest    VITALS:  Blood pressure 131/71, pulse 72, temperature 99.1 F (37.3 C), temperature source Oral, resp. rate 20, height 5\' 6"  (1.676 m), weight 76.2 kg, SpO2 100 %.  PHYSICAL EXAMINATION:   Physical Exam  GENERAL:  78 y.o.-year-old patient lying in bed in no acute distress.  EYES: Pupils equal, round, reactive to light and accommodation. No scleral icterus. Extraocular muscles intact.  HEENT: Head atraumatic, normocephalic. NG tube in place with bilious drainage noted.  NECK:  Supple, no jugular venous distention. No thyroid enlargement, no tenderness.  LUNGS: Normal breath sounds bilaterally, no wheezing, rales, rhonchi. No use of accessory muscles of respiration.  CARDIOVASCULAR:  S1, S2 normal. No murmurs, rubs, or gallops.  ABDOMEN: Soft, slightly tender, no rebound, rigidity, nondistended. + Bowel sounds. No organomegaly or mass. Mid-abdomen scar with previous surgery.   EXTREMITIES: No cyanosis, clubbing or edema b/l.    NEUROLOGIC: Cranial nerves II through XII are intact. No focal Motor or sensory deficits b/l.   PSYCHIATRIC: The patient is alert and oriented x 3.  SKIN: No obvious rash, lesion, or ulcer.    LABORATORY PANEL:   CBC Recent Labs  Lab 03/13/19 0551  WBC 11.0*  HGB 12.8*  HCT 40.5  PLT 208   ------------------------------------------------------------------------------------------------------------------  Chemistries  Recent Labs  Lab 03/12/19 1233  03/15/19 0430  NA 135   < > 140  K 5.2*   < > 4.6  CL 100   < > 109  CO2 24   < > 25  GLUCOSE 227*   < > 146*  BUN 20   < > 17  CREATININE 1.24   < > 1.02  CALCIUM 9.2   < > 8.2*  MG  --    < > 1.9  AST 23  --   --   ALT 22  --   --   ALKPHOS 111  --   --   BILITOT 0.7  --   --    < > = values in this interval not displayed.   ------------------------------------------------------------------------------------------------------------------  Cardiac Enzymes No results for input(s): TROPONINI in the last 168 hours. ------------------------------------------------------------------------------------------------------------------  RADIOLOGY:  Dg Abd Portable 1v  Result Date: 03/14/2019 CLINICAL DATA:  Small-bowel obstruction EXAM: PORTABLE ABDOMEN - 1 VIEW COMPARISON:  11/13/2018 FINDINGS: Oral contrast is seen to the level of the rectum. The enteric tube projects over the midline upper abdomen. Again identified are multiple dilated loops of small bowel scattered throughout the abdomen, somewhat improved from prior study. There is no definite pneumatosis or free air. IMPRESSION: 1. Oral contrast is seen to the level of the rectum. 2. Persistent dilated loops of small bowel, somewhat  improved from prior study. Electronically Signed   By: [name] M.D.   On: 03/14/2019 21:02   Dg Abd Portable 1v-small Bowel Obstruction Protocol-initial, 8 Hr Delay  Result Date: 03/14/2019 CLINICAL DATA:  Small-bowel obstruction EXAM: PORTABLE ABDOMEN - 1 VIEW COMPARISON:  Portable exam 1058 hours compared to 03/13/2019 FINDINGS: Persistent dilatation of small bowel loops in the mid and LEFT abdomen up to 5.2 cm transverse. Scattered small amounts of colonic gas are present. Minimal bowel wall thickening of a single small bowel loop in the LEFT mid abdomen. Tip of nasogastric tube is within a small hiatal hernia. Bones demineralized. IMPRESSION: Persistent small bowel obstruction. Electronically Signed   By: [name] M.D.   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  1.  Small bowel obstruction-this was the source of patient's abdominal pain nausea vomiting. - Patient admitted to the surgical service and status post NG tube decompression. -Gastrografin study from yesterday shows improvement in his bowel obstruction with contrast following all the way through to the large bowel.  As per surgery plan to remove NG tube and start clear liquids. -Continue supportive care with IV fluids, antiemetics.  2.  Hyperkalemia-mild, no acute EKG changes. -Resolved  3.  Diabetes type 2 with neuropathy- continue sliding scale insulin for now.  Hold patient's metformin, Toujeo -Blood sugar stable.  4.  COPD-no acute exacerbation-continue duo nebs as needed. - cont. Dulera.   5. Hyperlipidemia - will reorder atorvastatin once pt. Can take PO.   All the records are reviewed and case  discussed with Care Management/Social Worker. Management plans discussed with the patient, family and they are in agreement.  CODE STATUS: Full code  DVT Prophylaxis: Lovenox  TOTAL TIME TAKING CARE OF THIS PATIENT: 25 minutes.   POSSIBLE D/C unclear DAYS, DEPENDING ON CLINICAL CONDITION and progress.   Henreitta Leber M.D on 03/15/2019 at 3:09 PM  Between 7am to 6pm - Pager - [phone]  After 6pm go to [email or web address] - password [email or web address] [facility]  Office  [phone]  CC: Primary care physician; Lavera Guise, MD

## 2019-03-16 LAB — GLUCOSE, CAPILLARY
Glucose-Capillary: 156 mg/dL — ABNORMAL HIGH (ref 70–99)
Glucose-Capillary: 145 mg/dL — ABNORMAL HIGH (ref 70–99)
Glucose-Capillary: 203 mg/dL — ABNORMAL HIGH (ref 70–99)

## 2019-03-16 SURGERY — LAPAROTOMY, EXPLORATORY
Anesthesia: General

## 2019-03-16 NOTE — Discharge Summary (Signed)
[facility] SURGICAL DISCHARGE SUMMARY (cpt: [phone])  Patient ID: IDREES QUAM MRN: [id number] DOB/AGE: 17-Mar-1941 78 y.o.  Admit date: 03/12/2019 Discharge date: 03/16/2019  Discharge Diagnoses Patient Active Problem List   Diagnosis Date Noted  . 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 male presenting of evaluation of abdominal pain, nausea, vomiting.  He reports doing well yesterday but this morning started having abdominal distention, pain, and has progressed to include nausea and vomiting.  Patient presented to the [facility] clinic today but was sent to the ED due to dizziness and concerns for presyncopal event.  ED workup included head CT which was negative for acute findings.  CT of abdomen and pelvis was also done which showed small bowel obstruction with transition point in the right lower quadrant.  Patient reports that he's been admitted before for SBO and required NG tube placement.  He reports having an open appendectomy, ventral hernia [place] in [place] many years ago.  He also has medical comorbidities including HTN, CAD with MI s/p CABG x 4, prior SBO, DM, kidney disease.  His WBC is elevated to 14, with normal LFTs, elevated K to 5.2, and Cr 1.24.  U/A was negative and COVID test was negative. Reports he's had emesis multiple times in the ED and soiled his facemask.   Hospital Course: Patient was admitted to the general surgery service for conservative management with NGT decompression. This was attempted for HD1-2 without any evidence of Bowel function return. On HD2 (06/29), patient underwent KUB with gastrografin to reassess bowel obstruction. After 8 hour delay it appeared contrast had reached the rectum. On HD3, the patient had reported multiple bowel movements and had low output from NGT, which was removed. Diet was initiated and advancement over two days was  tolerated. On day of discharge (07/01), he was pain free, did not have any nausea/emesis, tolerated a soft diet, and continued to have flatus and bowel movements. The remainder of patient's hospital course was essentially unremarkable, and discharge planning was initiated accordingly with patient safely able to be discharged home with appropriate discharge instructions, and outpatient follow-up after all of his and family's questions were answered to their expressed satisfaction.  Discussed the patients clinical course, current condition, plan of care, and outpatient recommendations including follow up with PCP extensively with both the patient's wife and daughter via phone.   Discharge Condition: Good   Physical Examination:  Constitutional: Well appearing male, MAD Pulmonary: Normal effort, no respiratory distress Gastrointestinal: Soft, non-tender, non-distended, no rebound/guarding Skin: warm, Dry   Allergies as of 03/16/2019      Reactions   Penicillins Anaphylaxis   Neostigmine    PEA arrest      Medication List    TAKE these medications   aspirin 81 MG tablet Take 81 mg by mouth daily.   atorvastatin 20 MG tablet Commonly known as: LIPITOR Take 1 tablet (20 mg total) by mouth at bedtime.   budesonide-formoterol 160-4.5 MCG/ACT inhaler Commonly known as: SYMBICORT Inhale 2 puffs into the lungs 2 (two) times daily.   cholecalciferol 25 MCG (1000 UT) tablet Commonly known as: VITAMIN D3 Take 1,000 Units by mouth daily.   Dulaglutide 1.5 MG/0.5ML Sopn Commonly known as: Trulicity Inject 1.5 mg into the skin once a week.   ferrous sulfate 325 (65 FE) MG tablet Take 1 tablet (325 mg total) by mouth 2 (two) times  daily with a meal.   furosemide 40 MG tablet Commonly known as: LASIX Take 0.5 tablets (20 mg total) by mouth daily.   gabapentin 300 MG capsule Commonly known as: NEURONTIN TAKE 1 CAPSULE BY MOUTH DAILY AND 1 CAPSULE NIGHTLY What changed:   how much to  take  how to take this  when to take this  additional instructions   HumaLOG KwikPen 100 UNIT/ML KwikPen Generic drug: insulin lispro Inject 6-10 Units into the skin See admin instructions. Inject 6u under the skin daily at breakfast-time, 8u at lunch-time and inject 10u under the skin daily at dinner-time   ipratropium-albuterol 0.5-2.5 (3) MG/3ML Soln Commonly known as: DUONEB USE 3 ML VIA NEBULIZER EVERY 6 HOURS AS NEEDED What changed: See the new instructions.   loratadine 10 MG tablet Commonly known as: CLARITIN Take 1 tablet (10 mg total) by mouth daily.   Magnesium 250 MG Tabs Take 250 mg by mouth 2 (two) times daily.   metFORMIN 1000 MG (MOD) 24 hr tablet Commonly known as: GLUMETZA Take 1 tablet (1,000 mg total) by mouth daily with breakfast.   mometasone 50 MCG/ACT nasal spray Commonly known as: NASONEX Place 2 sprays into the nose daily.   montelukast 10 MG tablet Commonly known as: SINGULAIR Take 1 tablet (10 mg total) by mouth daily. What changed: when to take this   multivitamin with minerals Tabs tablet Take 1 tablet by mouth daily.   niacin 500 MG CR capsule Take 1 capsule (500 mg total) by mouth at bedtime. What changed: when to take this   ONE TOUCH ULTRA TEST test strip Generic drug: glucose blood USE THREE TIMES DAILY   senna 8.6 MG tablet Commonly known as: SENOKOT Take 1 tablet by mouth daily.   simethicone 80 MG chewable tablet Commonly known as: MYLICON Chew 2 tablets (160 mg total) by mouth 2 (two) times daily.   Toujeo SoloStar 300 UNIT/ML Sopn Generic drug: Insulin Glargine (1 Unit Dial) Inject 25 Units into the skin at bedtime.   vitamin B-12 1000 MCG tablet Commonly known as: CYANOCOBALAMIN Take 1,000 mcg by mouth daily.        Follow-up Information    Lavera Guise, MD. Schedule an appointment as soon as possible for a visit in 1 week(s).   Specialty: Internal Medicine Why: 1-2 week hospital follow up for small bowel  obstruction managed conservatively Contact information: [address] [place] [address] [phone]        [facility] Follow up.   Specialty: General Surgery Why: Patient DOES NOT need follow up appointment, this is to provide them our contact information Contact information: [place] [address] [place] [phone]           Time spent on discharge management including discussion of hospital course, clinical condition, outpatient instructions, prescriptions, and follow up with the patient and members of the medical team: >30 minutes  -- Edison Simon , PA-C [facility]  03/16/2019, 2:10 PM [phone] M-F: 7am - 4pm

## 2019-03-16 NOTE — Progress Notes (Signed)
Patient cleared for discharge.        Education complete. AVS printed. Discharge instructions given. All questions answered for patient clarification.  Prescriptions given, pharmacy verified.  IV removed.  Discharged to home Via POV          

## 2019-03-16 NOTE — Progress Notes (Signed)
[facility] [place] NAME: Daniel Hodges    MR#:  [id number]  DATE OF BIRTH:  November 27, 1940  SUBJECTIVE:   The patient tolerated liquid diet.  He has no complaints of abdominal pain, nausea or vomiting. REVIEW OF SYSTEMS:    Review of Systems  Constitutional: Negative for chills and fever.  HENT: Negative for congestion and tinnitus.   Eyes: Negative for blurred vision and double vision.  Respiratory: Negative for cough, shortness of breath and wheezing.   Cardiovascular: Negative for chest pain, orthopnea and PND.  Gastrointestinal: Negative for abdominal pain, diarrhea, nausea and vomiting.  Genitourinary: Negative for dysuria and hematuria.  Neurological: Negative for dizziness, sensory change and focal weakness.  All other systems reviewed and are negative.   Nutrition: Clear liquids Tolerating Diet: Yes Tolerating PT: Await Eval.   DRUG ALLERGIES:   Allergies  Allergen Reactions  . Penicillins Anaphylaxis  . Neostigmine     PEA arrest    VITALS:  Blood pressure 123/70, pulse 75, temperature 97.9 F (36.6 C), temperature source Oral, resp. rate 17, height 5\' 6"  (1.676 m), weight 76.2 kg, SpO2 99 %.  PHYSICAL EXAMINATION:   Physical Exam  GENERAL:  78 y.o.-year-old patient lying in bed in no acute distress.  EYES: Pupils equal, round, reactive to light and accommodation. No scleral icterus. Extraocular muscles intact.  HEENT: Head atraumatic, normocephalic. NG tube in place with bilious drainage noted.  NECK:  Supple, no jugular venous distention. No thyroid enlargement, no tenderness.  LUNGS: Normal breath sounds bilaterally, no wheezing, rales, rhonchi. No use of accessory muscles of respiration.  CARDIOVASCULAR: S1, S2 normal. No murmurs, rubs, or gallops.  ABDOMEN: Soft, slightly tender, no rebound, rigidity, nondistended. + Bowel sounds. No organomegaly or mass. Mid-abdomen scar with previous surgery.    EXTREMITIES: No cyanosis, clubbing or edema b/l.    NEUROLOGIC: Cranial nerves II through XII are intact. No focal Motor or sensory deficits b/l.   PSYCHIATRIC: The patient is alert and oriented x 3.  SKIN: No obvious rash, lesion, or ulcer.    LABORATORY PANEL:   CBC Recent Labs  Lab 03/13/19 0551  WBC 11.0*  HGB 12.8*  HCT 40.5  PLT 208   ------------------------------------------------------------------------------------------------------------------  Chemistries  Recent Labs  Lab 03/12/19 1233  03/15/19 0430  NA 135   < > 140  K 5.2*   < > 4.6  CL 100   < > 109  CO2 24   < > 25  GLUCOSE 227*   < > 146*  BUN 20   < > 17  CREATININE 1.24   < > 1.02  CALCIUM 9.2   < > 8.2*  MG  --    < > 1.9  AST 23  --   --   ALT 22  --   --   ALKPHOS 111  --   --   BILITOT 0.7  --   --    < > = values in this interval not displayed.   ------------------------------------------------------------------------------------------------------------------  Cardiac Enzymes No results for input(s): TROPONINI in the last 168 hours. ------------------------------------------------------------------------------------------------------------------  RADIOLOGY:  Dg Abd Portable 1v  Result Date: 03/14/2019 CLINICAL DATA:  Small-bowel obstruction EXAM: PORTABLE ABDOMEN - 1 VIEW COMPARISON:  11/13/2018 FINDINGS: Oral contrast is seen to the level of the rectum. The enteric tube projects over the midline upper abdomen. Again identified are multiple dilated loops of small bowel scattered throughout the abdomen, somewhat improved from  prior study. There is no definite pneumatosis or free air. IMPRESSION: 1. Oral contrast is seen to the level of the rectum. 2. Persistent dilated loops of small bowel, somewhat improved from prior study. Electronically Signed   By: [name] M.D.   On: 03/14/2019 21:02   Dg Abd Portable 1v-small Bowel Obstruction Protocol-initial, 8 Hr Delay  Result Date:  03/14/2019 CLINICAL DATA:  Small-bowel obstruction EXAM: PORTABLE ABDOMEN - 1 VIEW COMPARISON:  Portable exam 1058 hours compared to 03/13/2019 FINDINGS: Persistent dilatation of small bowel loops in the mid and LEFT abdomen up to 5.2 cm transverse. Scattered small amounts of colonic gas are present. Minimal bowel wall thickening of a single small bowel loop in the LEFT mid abdomen. Tip of nasogastric tube is within a small hiatal hernia. Bones demineralized. IMPRESSION: Persistent small bowel obstruction. Electronically Signed   By: [name] M.D.   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  1.  Small bowel obstruction-this was the source of patient's abdominal pain nausea vomiting. - Patient admitted to the surgical service and status post NG tube decompression. -Gastrografin study from yesterday shows improvement in his bowel obstruction with contrast following all the way through to the large bowel.  As per surgery, removed NG tube and started clear liquids. The patient tolerated liquid diet, advanced to soft diet.  2.  Hyperkalemia-mild, no acute EKG changes. -Resolved  3.  Diabetes type 2 with neuropathy- continue sliding scale insulin for now.  Hold patient's metformin, Toujeo -Blood sugar stable.  4.  COPD-no acute exacerbation-continue duo nebs as needed. - cont. Dulera.   5. Hyperlipidemia - will reorder atorvastatin once pt. Can take PO.  Medically stable.  Sign off. All the records are reviewed and case discussed with Care Management/Social Worker. Management plans discussed with the patient, family and they are in agreement.  CODE STATUS: Full code  DVT Prophylaxis:  Lovenox  TOTAL TIME TAKING CARE OF THIS PATIENT: 25 minutes.   POSSIBLE D/C today, DEPENDING ON CLINICAL CONDITION and progress.   Daniel Hodges M.D on 03/16/2019 at 11:12 AM  Between 7am to 6pm - Pager - [phone]  After 6pm go to [email or web address] - password [email or web address]  Office  [phone]  CC: Primary care physician; Lavera Guise, MD

## 2019-03-23 ENCOUNTER — Ambulatory Visit (INDEPENDENT_AMBULATORY_CARE_PROVIDER_SITE_OTHER): Payer: Medicare Other | Admitting: Internal Medicine

## 2019-03-23 ENCOUNTER — Other Ambulatory Visit: Payer: Self-pay | Admitting: Internal Medicine

## 2019-03-23 ENCOUNTER — Encounter: Payer: Self-pay | Admitting: Internal Medicine

## 2019-03-23 ENCOUNTER — Other Ambulatory Visit: Payer: Self-pay

## 2019-03-23 VITALS — BP 134/74 | HR 75 | Resp 16 | Ht 66.0 in | Wt 166.0 lb

## 2019-03-23 DIAGNOSIS — J4520 Mild intermittent asthma, uncomplicated: Secondary | ICD-10-CM

## 2019-03-23 DIAGNOSIS — J301 Allergic rhinitis due to pollen: Secondary | ICD-10-CM | POA: Diagnosis not present

## 2019-03-23 DIAGNOSIS — E1165 Type 2 diabetes mellitus with hyperglycemia: Secondary | ICD-10-CM | POA: Diagnosis not present

## 2019-03-23 DIAGNOSIS — K565 Intestinal adhesions [bands], unspecified as to partial versus complete obstruction: Secondary | ICD-10-CM | POA: Diagnosis not present

## 2019-03-23 MED ORDER — MOMETASONE FUROATE 50 MCG/ACT NA SUSP: 2.0000 | Freq: Every day | NASAL | 11 refills | Status: DC

## 2019-03-23 MED ORDER — PNEUMOVAX 23 25 MCG/0.5ML IJ INJ: 0.5000 mL | INJECTION | INTRAMUSCULAR | 0 refills | Status: AC

## 2019-03-23 NOTE — Progress Notes (Signed)
[facility] [place], [place] [address]  Internal MEDICINE  Office Visit Note  Patient Name: Daniel Hodges  [id number]  [id number]  Date of Service: 03/31/2019     Chief Complaint  Patient presents with  . small bowel obstrucion    hospital follow up     HPI Pt is here for recent hospital follow up. This hospital admission  Daniel Hodges a 78 y.o.malepresenting of evaluation of abdominal pain, nausea, vomiting. He reports doing well yesterday but this morning started having abdominal distention, pain, and has progressed to include nausea and vomiting. Patient presented to the [facility] clinic today but was sent to the ED due to dizziness and concerns for presyncopal event. ED workup included head CT which was negative for acute findings. CT of abdomen and pelvis was also done which showed small bowel obstruction with transition point in the right lower quadrant. Patient reports that he's been admitted before for SBO and required NG tube placement. He reports having an open appendectomy, ventral hernia repair in [place] many years ago. He also has medical comorbidities including HTN, CAD with MI s/p CABG x 4, prior SBO, DM, kidney disease. His WBC is elevated to 14, with normal LFTs, elevated K to 5.2, and Cr 1.24. U/A was negative and COVID test was negative. Reports he's had emesis multiple times in the ED and soiled his facemask. Pt was admitted for conservative therapy with NGT decompression, condition resolved  Current Medication: Outpatient Encounter Medications as of 03/23/2019  Medication Sig  . aspirin 81 MG tablet Take 81 mg by mouth daily.  Marland Kitchen atorvastatin (LIPITOR) 20 MG tablet Take 1 tablet (20 mg total) by mouth at bedtime.  . budesonide-formoterol (SYMBICORT) 160-4.5 MCG/ACT inhaler Inhale 2 puffs into the lungs 2 (two) times daily.   . cholecalciferol (VITAMIN D3) 25 MCG (1000 UT) tablet Take 1,000 Units by mouth daily.  .  ferrous sulfate 325 (65 FE) MG tablet Take 1 tablet (325 mg total) by mouth 2 (two) times daily with a meal.  . furosemide (LASIX) 40 MG tablet Take 0.5 tablets (20 mg total) by mouth daily.  Marland Kitchen gabapentin (NEURONTIN) 300 MG capsule TAKE 1 CAPSULE BY MOUTH DAILY AND 1 CAPSULE NIGHTLY (Patient taking differently: Take 600 mg by mouth at bedtime. )  . HUMALOG KWIKPEN 100 UNIT/ML KwikPen Inject 6-10 Units into the skin See admin instructions. Inject 6u under the skin daily at breakfast-time, 8u at lunch-time and inject 10u under the skin daily at dinner-time  . ipratropium-albuterol (DUONEB) 0.5-2.5 (3) MG/3ML SOLN USE 3 ML VIA NEBULIZER EVERY 6 HOURS AS NEEDED (Patient taking differently: Inhale 3 mLs into the lungs every 6 (six) hours as needed (shortness of breath). )  . loratadine (CLARITIN) 10 MG tablet Take 1 tablet (10 mg total) by mouth daily.  . Magnesium 250 MG TABS Take 250 mg by mouth 2 (two) times daily.  . metFORMIN (GLUMETZA) 1000 MG (MOD) 24 hr tablet Take 1 tablet (1,000 mg total) by mouth daily with breakfast.  . mometasone (NASONEX) 50 MCG/ACT nasal spray Place 2 sprays into the nose daily.  . montelukast (SINGULAIR) 10 MG tablet Take 1 tablet (10 mg total) by mouth daily. (Patient taking differently: Take 10 mg by mouth at bedtime. )  . Multiple Vitamin (MULTIVITAMIN WITH MINERALS) TABS tablet Take 1 tablet by mouth daily.  . niacin 500 MG CR capsule Take 1 capsule (500 mg total) by mouth at bedtime. (Patient taking differently: Take 500  mg by mouth daily. )  . ONE TOUCH ULTRA TEST test strip USE THREE TIMES DAILY  . senna (SENOKOT) 8.6 MG tablet Take 1 tablet by mouth daily.  . simethicone (MYLICON) 80 MG chewable tablet Chew 2 tablets (160 mg total) by mouth 2 (two) times daily.  [name] SOLOSTAR 300 UNIT/ML SOPN Inject 25 Units into the skin at bedtime.   . vitamin B-12 (CYANOCOBALAMIN) 1000 MCG tablet Take 1,000 mcg by mouth daily.  . [DISCONTINUED] Dulaglutide (TRULICITY) 1.5  AO/1.3YQ SOPN Inject 1.5 mg into the skin once a week.  . [DISCONTINUED] mometasone (NASONEX) 50 MCG/ACT nasal spray Place 2 sprays into the nose daily.   No facility-administered encounter medications on file as of 03/23/2019.     Surgical History: Past Surgical History:  Procedure Laterality Date  . APPENDECTOMY    . COLONOSCOPY WITH PROPOFOL N/A 06/11/2015   Procedure: COLONOSCOPY WITH PROPOFOL;  Surgeon: Manya Silvas, MD;  Location: [facility] ENDOSCOPY;  Service: Endoscopy;  Laterality: N/A;  . CORONARY ARTERY BYPASS GRAFT    . HERNIA REPAIR    . TEE WITHOUT CARDIOVERSION    . TRACHEOSTOMY    . VASCULAR SURGERY      Medical History: Past Medical History:  Diagnosis Date  . Anginal pain ([place])   . Asthma   . Coronary artery disease   . Diabetes mellitus without complication ([place])   . Hyperlipidemia   . Hypertension   . Sleep apnea     Family History: Family History  Problem Relation Age of Onset  . Cancer Sister   . Diabetes Daughter   . Diabetes Son     Social History   Socioeconomic History  . Marital status: Married    Spouse name: Not on file  . Number of children: Not on file  . Years of education: Not on file  . Highest education level: Not on file  Occupational History  . Not on file  Social Needs  . Financial resource strain: Not on file  . Food insecurity    Worry: Not on file    Inability: Not on file  . Transportation needs    Medical: Not on file    Non-medical: Not on file  Tobacco Use  . Smoking status: Never Smoker  . Smokeless tobacco: Never Used  Substance and Sexual Activity  . Alcohol use: No  . Drug use: No  . Sexual activity: Not on file  Lifestyle  . Physical activity    Days per week: Not on file    Minutes per session: Not on file  . Stress: Not on file  Relationships  . Social Herbalist on phone: Not on file    Gets together: Not on file    Attends religious service: Not on file    Active member of club or  organization: Not on file    Attends meetings of clubs or organizations: Not on file    Relationship status: Not on file  . Intimate partner violence    Fear of current or ex partner: Not on file    Emotionally abused: Not on file    Physically abused: Not on file    Forced sexual activity: Not on file  Other Topics Concern  . Not on file  Social History Narrative  . Not on file   Review of Systems  Constitutional: Negative for chills, fatigue and unexpected weight change.  HENT: Negative for congestion, postnasal drip, rhinorrhea, sneezing and sore throat.  Eyes: Negative for redness.  Respiratory: Negative for cough, chest tightness and shortness of breath.   Cardiovascular: Negative for chest pain and palpitations.  Gastrointestinal: Negative for abdominal pain, constipation, diarrhea, nausea and vomiting.  Genitourinary: Negative for dysuria and frequency.  Musculoskeletal: Negative for arthralgias, back pain, joint swelling and neck pain.  Skin: Negative for rash.  Neurological: Negative.  Negative for tremors and numbness.  Hematological: Negative for adenopathy. Does not bruise/bleed easily.  Psychiatric/Behavioral: Negative for behavioral problems (Depression), sleep disturbance and suicidal ideas. The patient is not nervous/anxious.     Vital Signs: BP 134/74   Pulse 75   Resp 16   Ht 5\' 6"  (1.676 m)   Wt 166 lb (75.3 kg)   SpO2 96%   BMI 26.79 kg/m    Physical Exam Constitutional:      General: He is not in acute distress.    Appearance: He is well-developed. He is not diaphoretic.  HENT:     Head: Normocephalic and atraumatic.     Mouth/Throat:     Pharynx: No oropharyngeal exudate.  Eyes:     Pupils: Pupils are equal, round, and reactive to light.  Neck:     Musculoskeletal: Normal range of motion and neck supple.     Thyroid: No thyromegaly.     Vascular: No JVD.     Trachea: No tracheal deviation.  Cardiovascular:     Rate and Rhythm: Normal rate  and regular rhythm.     Heart sounds: Normal heart sounds. No murmur. No friction rub. No gallop.   Pulmonary:     Effort: Pulmonary effort is normal. No respiratory distress.     Breath sounds: No wheezing or rales.  Chest:     Chest wall: No tenderness.  Abdominal:     General: Bowel sounds are normal.     Palpations: Abdomen is soft.  Musculoskeletal: Normal range of motion.  Lymphadenopathy:     Cervical: No cervical adenopathy.  Skin:    General: Skin is warm and dry.  Neurological:     Mental Status: He is alert and oriented to person, place, and time.     Cranial Nerves: No cranial nerve deficit.  Psychiatric:        Behavior: Behavior normal.        Thought Content: Thought content normal.        Judgment: Judgment normal.    Assessment/Plan: 1. Small bowel obstruction due to adhesions (HCC) Resolved, will monitor, DC Victoza due to excessive GI side effects, will monitor use of metformin as well, continue Toujeo and SS for now   2. 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 Uncontrolled type 2 diabetes mellitus with hyperglycemia (HCC) Dc victoza, continue meformin, Toujeo and Humalog SS   4. 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 I have discussed any further diagnostic evaluation that may be needed or ordered today. We also reviewed his medications today. he has been encouraged to call the office with any questions or concerns that should arise related to todays visit. Counseling: Diabetes Counseling:  1. Addition of ACE inh/ ARB'S for nephroprotection. Microalbumin is updated  2. Diabetic foot care, prevention of complications. Podiatry consult 3. Exercise and lose weight.  4. Diabetic eye examination, Diabetic eye exam is updated  5. Monitor blood sugar closlely. nutrition  counseling.  6. Sign and symptoms of hypoglycemia including shaking sweating,confusion and headaches.   I have reviewed all medical records from hospital follow up including radiology reports and consults from other physicians. Appropriate follow up diagnostics will be scheduled as needed. Patient/ Family understands the plan of treatment. Time spent25 minutes.   Dr Lavera Guise, MD Internal Medicine

## 2019-04-13 ENCOUNTER — Other Ambulatory Visit: Payer: Self-pay

## 2019-04-13 MED ORDER — GABAPENTIN 300 MG PO CAPS: ORAL_CAPSULE | ORAL | 0 refills | Status: DC

## 2019-04-13 MED ORDER — ATORVASTATIN CALCIUM 20 MG PO TABS: 20.0000 mg | ORAL_TABLET | Freq: Every day | ORAL | 1 refills | Status: DC

## 2019-04-15 ENCOUNTER — Other Ambulatory Visit: Payer: Self-pay

## 2019-04-15 DIAGNOSIS — E7800 Pure hypercholesterolemia, unspecified: Secondary | ICD-10-CM | POA: Diagnosis not present

## 2019-04-15 DIAGNOSIS — I10 Essential (primary) hypertension: Secondary | ICD-10-CM | POA: Diagnosis not present

## 2019-04-15 DIAGNOSIS — E0842 Diabetes mellitus due to underlying condition with diabetic polyneuropathy: Secondary | ICD-10-CM | POA: Diagnosis not present

## 2019-04-15 DIAGNOSIS — E083499 Diabetes mellitus due to underlying condition with severe nonproliferative diabetic retinopathy without macular edema, unspecified eye: Secondary | ICD-10-CM | POA: Diagnosis not present

## 2019-04-15 DIAGNOSIS — E1165 Type 2 diabetes mellitus with hyperglycemia: Secondary | ICD-10-CM | POA: Diagnosis not present

## 2019-04-15 DIAGNOSIS — J4520 Mild intermittent asthma, uncomplicated: Secondary | ICD-10-CM

## 2019-04-15 MED ORDER — SENNOSIDES 8.6 MG PO TABS: 1.0000 | ORAL_TABLET | Freq: Every day | ORAL | 1 refills | Status: DC

## 2019-04-15 MED ORDER — SIMETHICONE 80 MG PO CHEW: 160.0000 mg | CHEWABLE_TABLET | Freq: Two times a day (BID) | ORAL | 1 refills | Status: DC

## 2019-04-15 MED ORDER — IPRATROPIUM-ALBUTEROL 0.5-2.5 (3) MG/3ML IN SOLN: RESPIRATORY_TRACT | 1 refills | Status: DC

## 2019-04-21 ENCOUNTER — Other Ambulatory Visit: Payer: Self-pay

## 2019-04-21 MED ORDER — FUROSEMIDE 40 MG PO TABS: 20.0000 mg | ORAL_TABLET | Freq: Every day | ORAL | 0 refills | Status: DC

## 2019-04-22 ENCOUNTER — Other Ambulatory Visit: Payer: Self-pay

## 2019-04-22 DIAGNOSIS — J4520 Mild intermittent asthma, uncomplicated: Secondary | ICD-10-CM

## 2019-04-22 DIAGNOSIS — E1165 Type 2 diabetes mellitus with hyperglycemia: Secondary | ICD-10-CM | POA: Diagnosis not present

## 2019-04-22 DIAGNOSIS — I10 Essential (primary) hypertension: Secondary | ICD-10-CM | POA: Diagnosis not present

## 2019-04-22 DIAGNOSIS — G4730 Sleep apnea, unspecified: Secondary | ICD-10-CM | POA: Diagnosis not present

## 2019-04-22 DIAGNOSIS — E083499 Diabetes mellitus due to underlying condition with severe nonproliferative diabetic retinopathy without macular edema, unspecified eye: Secondary | ICD-10-CM | POA: Diagnosis not present

## 2019-04-22 DIAGNOSIS — E7800 Pure hypercholesterolemia, unspecified: Secondary | ICD-10-CM | POA: Diagnosis not present

## 2019-04-22 DIAGNOSIS — E0821 Diabetes mellitus due to underlying condition with diabetic nephropathy: Secondary | ICD-10-CM | POA: Diagnosis not present

## 2019-04-22 MED ORDER — IPRATROPIUM-ALBUTEROL 0.5-2.5 (3) MG/3ML IN SOLN: RESPIRATORY_TRACT | 1 refills | Status: DC

## 2019-04-27 ENCOUNTER — Other Ambulatory Visit: Payer: Self-pay

## 2019-04-27 ENCOUNTER — Ambulatory Visit (INDEPENDENT_AMBULATORY_CARE_PROVIDER_SITE_OTHER): Payer: Medicare Other | Admitting: Adult Health

## 2019-04-27 ENCOUNTER — Encounter: Payer: Self-pay | Admitting: Adult Health

## 2019-04-27 VITALS — BP 131/65 | HR 80 | Resp 16 | Ht 66.0 in | Wt 169.4 lb

## 2019-04-27 DIAGNOSIS — I10 Essential (primary) hypertension: Secondary | ICD-10-CM

## 2019-04-27 DIAGNOSIS — J4520 Mild intermittent asthma, uncomplicated: Secondary | ICD-10-CM

## 2019-04-27 DIAGNOSIS — E782 Mixed hyperlipidemia: Secondary | ICD-10-CM

## 2019-04-27 NOTE — Progress Notes (Signed)
[facility] [place], [place] [address]  Internal MEDICINE  Office Visit Note  Patient Name: Daniel Hodges  [id number]  [id number]  Date of Service: 04/27/2019  Chief Complaint  Patient presents with  . Follow-up  . Diabetes  . Hypertension  . Hyperlipidemia  . Coronary Artery Disease  . Asthma    HPI  Pt is here for follow up on HTN, HLD and asthma. He reports overall he is doing well.  Endocrine has his blood sugars under control at this time.  His blood pressure is well controlled.  His asthma is also under control, he is using his nebulizer and inhalers as needed.     Current Medication: Outpatient Encounter Medications as of 04/27/2019  Medication Sig  . aspirin 81 MG tablet Take 81 mg by mouth daily.  Marland Kitchen atorvastatin (LIPITOR) 20 MG tablet Take 1 tablet (20 mg total) by mouth at bedtime.  . budesonide-formoterol (SYMBICORT) 160-4.5 MCG/ACT inhaler Inhale 2 puffs into the lungs 2 (two) times daily.   . cholecalciferol (VITAMIN D3) 25 MCG (1000 UT) tablet Take 1,000 Units by mouth daily.  . ferrous sulfate 325 (65 FE) MG tablet Take 1 tablet (325 mg total) by mouth 2 (two) times daily with a meal.  . furosemide (LASIX) 40 MG tablet Take 0.5 tablets (20 mg total) by mouth daily.  Marland Kitchen gabapentin (NEURONTIN) 300 MG capsule TAKE 1 CAPSULE BY MOUTH DAILY AND 1 CAPSULE NIGHTLY  . HUMALOG KWIKPEN 100 UNIT/ML KwikPen Inject 6-10 Units into the skin See admin instructions. Inject 6u under the skin daily at breakfast-time, 8u at lunch-time and inject 10u under the skin daily at dinner-time  . ipratropium-albuterol (DUONEB) 0.5-2.5 (3) MG/3ML SOLN USE 3 ML VIA NEBULIZER EVERY 6 HOURS AS NEEDED  . loratadine (CLARITIN) 10 MG tablet Take 1 tablet (10 mg total) by mouth daily.  . Magnesium 250 MG TABS Take 250 mg by mouth 2 (two) times daily.  . metFORMIN (GLUMETZA) 1000 MG (MOD) 24 hr tablet Take 1 tablet (1,000 mg total) by mouth daily with breakfast.  .  mometasone (NASONEX) 50 MCG/ACT nasal spray Place 2 sprays into the nose daily.  . montelukast (SINGULAIR) 10 MG tablet Take 1 tablet (10 mg total) by mouth daily. (Patient taking differently: Take 10 mg by mouth at bedtime. )  . Multiple Vitamin (MULTIVITAMIN WITH MINERALS) TABS tablet Take 1 tablet by mouth daily.  . niacin 500 MG CR capsule Take 1 capsule (500 mg total) by mouth at bedtime. (Patient taking differently: Take 500 mg by mouth daily. )  . ONE TOUCH ULTRA TEST test strip USE THREE TIMES DAILY  . senna (SENOKOT) 8.6 MG tablet Take 1 tablet (8.6 mg total) by mouth daily.  . simethicone (MYLICON) 80 MG chewable tablet Chew 2 tablets (160 mg total) by mouth 2 (two) times daily.  [name] SOLOSTAR 300 UNIT/ML SOPN Inject 25 Units into the skin at bedtime.   . vitamin B-12 (CYANOCOBALAMIN) 1000 MCG tablet Take 1,000 mcg by mouth daily.   No facility-administered encounter medications on file as of 04/27/2019.     Surgical History: Past Surgical History:  Procedure Laterality Date  . APPENDECTOMY    . COLONOSCOPY WITH PROPOFOL N/A 06/11/2015   Procedure: COLONOSCOPY WITH PROPOFOL;  Surgeon: Manya Silvas, MD;  Location: [facility] ENDOSCOPY;  Service: Endoscopy;  Laterality: N/A;  . CORONARY ARTERY BYPASS GRAFT    . HERNIA REPAIR    . TEE WITHOUT CARDIOVERSION    .  TRACHEOSTOMY    . VASCULAR SURGERY      Medical History: Past Medical History:  Diagnosis Date  . Anginal pain ([place])   . Asthma   . Coronary artery disease   . Diabetes mellitus without complication ([place])   . Hyperlipidemia   . Hypertension   . Sleep apnea     Family History: Family History  Problem Relation Age of Onset  . Cancer Sister   . Diabetes Daughter   . Diabetes Son     Social History   Socioeconomic History  . Marital status: Married    Spouse name: Not on file  . Number of children: Not on file  . Years of education: Not on file  . Highest education level: Not on file  Occupational  History  . Not on file  Social Needs  . Financial resource strain: Not on file  . Food insecurity    Worry: Not on file    Inability: Not on file  . Transportation needs    Medical: Not on file    Non-medical: Not on file  Tobacco Use  . Smoking status: Never Smoker  . Smokeless tobacco: Never Used  Substance and Sexual Activity  . Alcohol use: No  . Drug use: No  . Sexual activity: Not on file  Lifestyle  . Physical activity    Days per week: Not on file    Minutes per session: Not on file  . Stress: Not on file  Relationships  . Social Herbalist on phone: Not on file    Gets together: Not on file    Attends religious service: Not on file    Active member of club or organization: Not on file    Attends meetings of clubs or organizations: Not on file    Relationship status: Not on file  . Intimate partner violence    Fear of current or ex partner: Not on file    Emotionally abused: Not on file    Physically abused: Not on file    Forced sexual activity: Not on file  Other Topics Concern  . Not on file  Social History Narrative  . Not on file      Review of Systems  Constitutional: Negative.  Negative for chills, fatigue and unexpected weight change.  HENT: Negative.  Negative for congestion, rhinorrhea, sneezing and sore throat.   Eyes: Negative for redness.  Respiratory: Negative.  Negative for cough, chest tightness and shortness of breath.   Cardiovascular: Negative.  Negative for chest pain and palpitations.  Gastrointestinal: Negative.  Negative for abdominal pain, constipation, diarrhea, nausea and vomiting.  Endocrine: Negative.   Genitourinary: Negative.  Negative for dysuria and frequency.  Musculoskeletal: Negative.  Negative for arthralgias, back pain, joint swelling and neck pain.  Skin: Negative.  Negative for rash.  Allergic/Immunologic: Negative.   Neurological: Negative.  Negative for tremors and numbness.  Hematological: Negative for  adenopathy. Does not bruise/bleed easily.  Psychiatric/Behavioral: Negative.  Negative for behavioral problems, sleep disturbance and suicidal ideas. The patient is not nervous/anxious.     Vital Signs: BP 131/65   Pulse 80   Resp 16   Ht 5\' 6"  (1.676 m)   Wt 169 lb 6.4 oz (76.8 kg)   SpO2 98%   BMI 27.34 kg/m    Physical Exam Vitals signs and nursing note reviewed.  Constitutional:      General: He is not in acute distress.    Appearance: He is  well-developed. He is not diaphoretic.  HENT:     Head: Normocephalic and atraumatic.     Mouth/Throat:     Pharynx: No oropharyngeal exudate.  Eyes:     Pupils: Pupils are equal, round, and reactive to light.  Neck:     Musculoskeletal: Normal range of motion and neck supple.     Thyroid: No thyromegaly.     Vascular: No JVD.     Trachea: No tracheal deviation.  Cardiovascular:     Rate and Rhythm: Normal rate and regular rhythm.     Heart sounds: Normal heart sounds. No murmur. No friction rub. No gallop.   Pulmonary:     Effort: Pulmonary effort is normal. No respiratory distress.     Breath sounds: Normal breath sounds. No wheezing or rales.  Chest:     Chest wall: No tenderness.  Abdominal:     Palpations: Abdomen is soft.     Tenderness: There is no abdominal tenderness. There is no guarding.  Musculoskeletal: Normal range of motion.  Lymphadenopathy:     Cervical: No cervical adenopathy.  Skin:    General: Skin is warm and dry.  Neurological:     Mental Status: He is alert and oriented to person, place, and time.     Cranial Nerves: No cranial nerve deficit.  Psychiatric:        Behavior: Behavior normal.        Thought Content: Thought content normal.        Judgment: Judgment normal.     Assessment/Plan: 1. Essential hypertension Stable, continue current medications.   2. 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   3. Mixed  hyperlipidemia Will repeat lipid panel  General Counseling: Sai verbalizes understanding of the findings of todays visit and agrees with plan of treatment. I have discussed any further diagnostic evaluation that may be needed or ordered today. We also reviewed his medications today. he has been encouraged to call the office with any questions or concerns that should arise related to todays visit.    No orders of the defined types were placed in this encounter.   No orders of the defined types were placed in this encounter.   Time spent: 25 Minutes   This patient was seen by Orson Gear AGNP-C in Collaboration with Dr Lavera Guise as a part of collaborative care agreement     Kendell Bane AGNP-C Internal medicine

## 2019-05-02 ENCOUNTER — Other Ambulatory Visit: Payer: Self-pay

## 2019-05-02 DIAGNOSIS — J301 Allergic rhinitis due to pollen: Secondary | ICD-10-CM

## 2019-05-02 MED ORDER — MONTELUKAST SODIUM 10 MG PO TABS: 10.0000 mg | ORAL_TABLET | Freq: Every day | ORAL | 5 refills | Status: DC

## 2019-05-18 DIAGNOSIS — G4733 Obstructive sleep apnea (adult) (pediatric): Secondary | ICD-10-CM | POA: Diagnosis not present

## 2019-05-25 DIAGNOSIS — G4733 Obstructive sleep apnea (adult) (pediatric): Secondary | ICD-10-CM | POA: Diagnosis not present

## 2019-05-25 DIAGNOSIS — E1122 Type 2 diabetes mellitus with diabetic chronic kidney disease: Secondary | ICD-10-CM | POA: Diagnosis not present

## 2019-05-25 DIAGNOSIS — I10 Essential (primary) hypertension: Secondary | ICD-10-CM | POA: Diagnosis not present

## 2019-05-25 DIAGNOSIS — I2510 Atherosclerotic heart disease of native coronary artery without angina pectoris: Secondary | ICD-10-CM | POA: Diagnosis not present

## 2019-05-25 DIAGNOSIS — E782 Mixed hyperlipidemia: Secondary | ICD-10-CM | POA: Diagnosis not present

## 2019-06-10 DIAGNOSIS — E119 Type 2 diabetes mellitus without complications: Secondary | ICD-10-CM | POA: Diagnosis not present

## 2019-06-21 ENCOUNTER — Other Ambulatory Visit: Payer: Self-pay

## 2019-06-21 MED ORDER — GABAPENTIN 300 MG PO CAPS: ORAL_CAPSULE | ORAL | 0 refills | Status: DC

## 2019-06-23 ENCOUNTER — Other Ambulatory Visit: Payer: Self-pay

## 2019-06-23 MED ORDER — FUROSEMIDE 40 MG PO TABS: 20.0000 mg | ORAL_TABLET | Freq: Every day | ORAL | 0 refills | Status: DC

## 2019-06-28 ENCOUNTER — Encounter: Payer: Self-pay | Admitting: Internal Medicine

## 2019-06-28 ENCOUNTER — Ambulatory Visit (INDEPENDENT_AMBULATORY_CARE_PROVIDER_SITE_OTHER): Payer: Medicare Other | Admitting: Internal Medicine

## 2019-06-28 VITALS — BP 141/76 | HR 67 | Temp 97.7°F | Resp 16 | Ht 66.0 in | Wt 172.0 lb

## 2019-06-28 DIAGNOSIS — Z9989 Dependence on other enabling machines and devices: Secondary | ICD-10-CM | POA: Diagnosis not present

## 2019-06-28 DIAGNOSIS — R0602 Shortness of breath: Secondary | ICD-10-CM | POA: Diagnosis not present

## 2019-06-28 DIAGNOSIS — J301 Allergic rhinitis due to pollen: Secondary | ICD-10-CM

## 2019-06-28 DIAGNOSIS — J4520 Mild intermittent asthma, uncomplicated: Secondary | ICD-10-CM | POA: Diagnosis not present

## 2019-06-28 DIAGNOSIS — G4733 Obstructive sleep apnea (adult) (pediatric): Secondary | ICD-10-CM | POA: Diagnosis not present

## 2019-06-28 MED ORDER — IPRATROPIUM-ALBUTEROL 0.5-2.5 (3) MG/3ML IN SOLN: RESPIRATORY_TRACT | 1 refills | Status: DC

## 2019-06-28 NOTE — Progress Notes (Signed)
[facility], [place] [address]  Pulmonary Sleep Medicine   Office Visit Note  Patient Name: Daniel Hodges DOB: 08/13/1941 MRN [id number]  Date of Service: 06/28/2019  Complaints/HPI: Pt is here for pulmonary follow up. Overall he is doing well.  He denies any recent issues with asthma or allergies.  He is requesting refills on his nebs, which will be done today.  He is wearing his cpap as discussed.  Pt reports good compliance with CPAP therapy. Cleaning machine by hand, and changing filters and tubing as directed. Denies headaches, sinus issues, palpitations, or hemoptysis.    ROS  General: (-) fever, (-) chills, (-) night sweats, (-) weakness Skin: (-) rashes, (-) itching,. Eyes: (-) visual changes, (-) redness, (-) itching. Nose and Sinuses: (-) nasal stuffiness or itchiness, (-) postnasal drip, (-) nosebleeds, (-) sinus trouble. Mouth and Throat: (-) sore throat, (-) hoarseness. Neck: (-) swollen glands, (-) enlarged thyroid, (-) neck pain. Respiratory: - cough, (-) bloody sputum, - shortness of breath, - wheezing. Cardiovascular: - ankle swelling, (-) chest pain. Lymphatic: (-) lymph node enlargement. Neurologic: (-) numbness, (-) tingling. Psychiatric: (-) anxiety, (-) depression   Current Medication: Outpatient Encounter Medications as of 06/28/2019  Medication Sig  . aspirin 81 MG tablet Take 81 mg by mouth daily.  Marland Kitchen atorvastatin (LIPITOR) 20 MG tablet Take 1 tablet (20 mg total) by mouth at bedtime.  . budesonide-formoterol (SYMBICORT) 160-4.5 MCG/ACT inhaler Inhale 2 puffs into the lungs 2 (two) times daily.   . cholecalciferol (VITAMIN D3) 25 MCG (1000 UT) tablet Take 1,000 Units by mouth daily.  . ferrous sulfate 325 (65 FE) MG tablet Take 1 tablet (325 mg total) by mouth 2 (two) times daily with a meal.  . furosemide (LASIX) 40 MG tablet Take 0.5 tablets (20 mg total) by mouth daily.  Marland Kitchen gabapentin (NEURONTIN) 300 MG capsule  TAKE 1 CAPSULE BY MOUTH DAILY AND 1 CAPSULE NIGHTLY  . HUMALOG KWIKPEN 100 UNIT/ML KwikPen Inject 6-10 Units into the skin See admin instructions. Inject 6u under the skin daily at breakfast-time, 8u at lunch-time and inject 10u under the skin daily at dinner-time  . ipratropium-albuterol (DUONEB) 0.5-2.5 (3) MG/3ML SOLN USE 3 ML VIA NEBULIZER EVERY 6 HOURS AS NEEDED  . loratadine (CLARITIN) 10 MG tablet Take 1 tablet (10 mg total) by mouth daily.  . Magnesium 250 MG TABS Take 250 mg by mouth 2 (two) times daily.  . metFORMIN (GLUMETZA) 1000 MG (MOD) 24 hr tablet Take 1 tablet (1,000 mg total) by mouth daily with breakfast.  . mometasone (NASONEX) 50 MCG/ACT nasal spray Place 2 sprays into the nose daily.  . montelukast (SINGULAIR) 10 MG tablet Take 1 tablet (10 mg total) by mouth daily.  . Multiple Vitamin (MULTIVITAMIN WITH MINERALS) TABS tablet Take 1 tablet by mouth daily.  . niacin 500 MG CR capsule Take 1 capsule (500 mg total) by mouth at bedtime. (Patient taking differently: Take 500 mg by mouth daily. )  . ONE TOUCH ULTRA TEST test strip USE THREE TIMES DAILY  . senna (SENOKOT) 8.6 MG tablet Take 1 tablet (8.6 mg total) by mouth daily.  . simethicone (MYLICON) 80 MG chewable tablet Chew 2 tablets (160 mg total) by mouth 2 (two) times daily.  [name] SOLOSTAR 300 UNIT/ML SOPN Inject 25 Units into the skin at bedtime.   . vitamin B-12 (CYANOCOBALAMIN) 1000 MCG tablet Take 1,000 mcg by mouth daily.   No facility-administered encounter medications on file  as of 06/28/2019.     Surgical History: Past Surgical History:  Procedure Laterality Date  . APPENDECTOMY    . COLONOSCOPY WITH PROPOFOL N/A 06/11/2015   Procedure: COLONOSCOPY WITH PROPOFOL;  Surgeon: Manya Silvas, MD;  Location: [facility] ENDOSCOPY;  Service: Endoscopy;  Laterality: N/A;  . CORONARY ARTERY BYPASS GRAFT    . HERNIA REPAIR    . TEE WITHOUT CARDIOVERSION    . TRACHEOSTOMY    . VASCULAR SURGERY      Medical  History: Past Medical History:  Diagnosis Date  . Anginal pain ([place])   . Asthma   . Coronary artery disease   . Diabetes mellitus without complication ([place])   . Hyperlipidemia   . Hypertension   . Sleep apnea     Family History: Family History  Problem Relation Age of Onset  . Cancer Sister   . Diabetes Daughter   . Diabetes Son     Social History: Social History   Socioeconomic History  . Marital status: Married    Spouse name: Not on file  . Number of children: Not on file  . Years of education: Not on file  . Highest education level: Not on file  Occupational History  . Not on file  Social Needs  . Financial resource strain: Not on file  . Food insecurity    Worry: Not on file    Inability: Not on file  . Transportation needs    Medical: Not on file    Non-medical: Not on file  Tobacco Use  . Smoking status: Never Smoker  . Smokeless tobacco: Never Used  Substance and Sexual Activity  . Alcohol use: No  . Drug use: No  . Sexual activity: Not on file  Lifestyle  . Physical activity    Days per week: Not on file    Minutes per session: Not on file  . Stress: Not on file  Relationships  . Social Herbalist on phone: Not on file    Gets together: Not on file    Attends religious service: Not on file    Active member of club or organization: Not on file    Attends meetings of clubs or organizations: Not on file    Relationship status: Not on file  . Intimate partner violence    Fear of current or ex partner: Not on file    Emotionally abused: Not on file    Physically abused: Not on file    Forced sexual activity: Not on file  Other Topics Concern  . Not on file  Social History Narrative  . Not on file    Vital Signs: Blood pressure (!) 141/76, pulse 67, temperature 97.7 F (36.5 C), resp. rate 16, height 5\' 6"  (1.676 m), weight 172 lb (78 kg), SpO2 97 %.  Examination: General Appearance: The patient is well-developed, well-nourished,  and in no distress. Skin: Gross inspection of skin unremarkable. Head: normocephalic, no gross deformities. Eyes: no gross deformities noted. ENT: ears appear grossly normal no exudates. Neck: Supple. No thyromegaly. No LAD. Respiratory: clear bilaterally. Cardiovascular: Normal S1 and S2 without murmur or rub. Extremities: No cyanosis. pulses are equal. Neurologic: Alert and oriented. No involuntary movements.  LABS: No results found for this or any previous visit (from the past 2160 hour(s)).  Radiology: Dg Abd 1 View  Result Date: 03/13/2019 CLINICAL DATA:  Small-bowel obstruction EXAM: ABDOMEN - 1 VIEW COMPARISON:  Plain film of the abdomen dated 03/12/2019. CT abdomen  dated 03/12/2019. FINDINGS: Persistent gaseous distension of small bowel loops within the abdomen and upper pelvis, mild to moderate in degree, perhaps mildly improved compared to the CT abdomen of 03/12/2019. Enteric tube within the stomach. No evidence of soft tissue mass or abnormal fluid collection. No evidence of free intraperitoneal air. IMPRESSION: Persistent evidence of small bowel obstruction, mild to moderate in degree, perhaps mildly improved compared to CT abdomen of 03/12/2019. Electronically Signed   By: Franki Cabot M.D.   On: 03/13/2019 06:59   Dg Abd 1 View  Result Date: 03/12/2019 CLINICAL DATA:  NG tube placement EXAM: ABDOMEN - 1 VIEW COMPARISON:  None. FINDINGS: The NG tube projects over the gastric body. Again noted are dilated loops of small bowel in the abdomen. IMPRESSION: NG tube projects over the patient's gastric body. Electronically Signed   By: [name] M.D.   On: 03/12/2019 22:32   Ct Head Wo Contrast  Result Date: 03/12/2019 CLINICAL DATA:  Dizziness. EXAM: CT HEAD WITHOUT CONTRAST TECHNIQUE: Contiguous axial images were obtained from the base of the skull through the vertex without intravenous contrast. COMPARISON:  CT scan dated 01/08/2008 FINDINGS: Brain: No evidence of acute  infarction, hemorrhage, hydrocephalus, extra-axial collection or mass lesion/mass effect. There is diffuse mild atrophy with secondary slight dilatation of the ventricles. Vascular: No hyperdense vessel or unexpected calcification. Skull: Normal. Negative for fracture or focal lesion. Sinuses/Orbits: Normal. Other: None IMPRESSION: No acute intracranial abnormality. Mild diffuse atrophy with secondary dilatation of the ventricles primarily in the frontal horns. Electronically Signed   By: Lorriane Shire M.D.   On: 03/12/2019 17:26   Ct Abdomen Pelvis W Contrast  Result Date: 03/12/2019 CLINICAL DATA:  Abdominal pain, dizziness, nausea, and vomiting since this morning, history asthma, coronary artery disease, diabetes mellitus, hypertension EXAM: CT ABDOMEN AND PELVIS WITH CONTRAST TECHNIQUE: Multidetector CT imaging of the abdomen and pelvis was performed using the standard protocol following bolus administration of intravenous contrast. Sagittal and coronal MPR images reconstructed from axial data set. Patient developed nausea and vomiting following IV contrast administration. Renal delayed imaging could not be performed. CONTRAST:  178mL OMNIPAQUE IOHEXOL 300 MG/ML SOLN IV. No oral contrast. COMPARISON:  12/20/2017 FINDINGS: Lower chest: Minimal atelectasis or scarring at lung bases. Hepatobiliary: Gallbladder and liver normal appearance Pancreas: Normal appearance Spleen: Normal appearance Adrenals/Urinary Tract: Adrenal glands, kidneys, ureters, and bladder normal appearance Stomach/Bowel: Appendix surgically absent by history. Distal esophagus distended by fluid. Markedly distended stomach, proximal small bowel and mid small bowel containing fluid. Transition from dilated to nondilated small bowel occurs in the anterior RIGHT mid abdomen image 60 where a small bowel loop appears tethered to the anterior abdominal wall favor adhesion images 50 7-62. A small bowel stool sign and mild bowel wall thickening are  seen immediately proximal to the point of obstruction. Distal small bowel loops appear decompressed. No obvious mass. Colon decompressed. Minimal sigmoid diverticulosis. Vascular/Lymphatic: Atherosclerotic calcifications aorta and coronary arteries. Aorta normal caliber. Vascular structures appear patent. No adenopathy. Reproductive: Prostatic enlargement, gland measuring 5.9 x 4.3 x 3.6 cm. Other: LEFT inguinal hernia containing fat and fluid. Small amount of ascites within the LEFT inguinal canal, pelvis, and inferior LEFT pericolic gutter. No free air. Musculoskeletal: No acute osseous findings. Scattered Schmorl's nodes lumbar spine. IMPRESSION: High-grade mid to distal small bowel obstruction likely due a to adhesion in the anterior RIGHT mid abdomen, with significant dilatation of proximal small bowel loops and stomach as well as distal esophagus. No evidence of perforation.  Minimal free pelvic fluid. Prostatic enlargement. LEFT inguinal hernia containing fat and minimal fluid. Atherosclerotic disease calcifications including coronary arteries. Electronically Signed   By: [name] M.D.   On: 03/12/2019 17:34    No results found.  No results found.    Assessment and Plan: Patient Active Problem List   Diagnosis Date Noted  . Small bowel obstruction due to adhesions ([place]) 03/12/2019  . Pleural effusion 08/06/2018  . Encounter for general adult medical examination with abnormal findings 03/20/2018  . Chronic otitis externa of both ears 03/20/2018  . Uncontrolled type 2 diabetes mellitus with hypoglycemia ([place]) 03/20/2018  . Dysuria 03/20/2018  . SOB (shortness of breath) 01/22/2018  . Cough 01/14/2018  . Constipation 12/30/2017  . Non-seasonal allergic rhinitis due to pollen 12/30/2017  . Dehydration 12/30/2017  . Essential hypertension 12/29/2017  . Uncontrolled type 2 diabetes mellitus with hyperglycemia ([place]) 12/27/2017  . Acute upper respiratory infection 12/27/2017  . Need for  vaccination against Streptococcus pneumoniae using pneumococcal conjugate vaccine 13 12/27/2017  . Coronary artery disease due to lipid rich plaque 12/27/2017  . Mixed hyperlipidemia 12/27/2017  . AKI (acute kidney injury) ([place]) 12/20/2017  . OSA on CPAP 02/01/2016  . Aortic ejection murmur 08/14/2015  . Tracheostomy in place [facility]) 09/22/2013  . Ileus ([place]) 09/20/2013  . Anemia 09/19/2013  . Hypercarbia 09/19/2013  . Postoperative anemia due to acute blood loss 09/06/2013  . Thrombocytopenia ([place]) 09/06/2013  . 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 Abnormal stress ECG 09/01/2013  . Asthma 09/01/2013  . S/P appendectomy 09/01/2013    1. OSA on CPAP Continue to use cpap as discussed.  2. 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 - ipratropium-albuterol (DUONEB) 0.5-2.5 (3) MG/3ML SOLN; USE 3 ML VIA NEBULIZER EVERY 6 HOURS AS NEEDED  Dispense: 1080 mL; Refill: 1  3. Non-seasonal allergic rhinitis due to pollen Continue present management.  4. SOB (shortness of breath) - Spirometry with Graph  General Counseling: I have discussed the findings of the evaluation and examination with Author.  I have also discussed any further diagnostic evaluation thatmay be needed or ordered today. Laquan verbalizes understanding of the findings of todays visit. We also reviewed his medications today and discussed drug interactions and side effects including but not limited excessive drowsiness and altered mental states. We also discussed that there is always a risk not just to him but also people around him. he has been encouraged to call the office with any questions or concerns that should arise related to todays visit.    Time spent: 20 This patient was seen by Orson Gear AGNP-C in Collaboration with Dr. Devona Konig as a part of collaborative care agreement.   I have personally obtained a history, examined the patient,  evaluated laboratory and imaging results, formulated the assessment and plan and placed orders.    Allyne Gee, MD [facility] Pulmonary and Critical Care Sleep medicine

## 2019-07-15 ENCOUNTER — Ambulatory Visit: Payer: Medicare Other | Admitting: Nurse Practitioner

## 2019-07-18 DIAGNOSIS — E1165 Type 2 diabetes mellitus with hyperglycemia: Secondary | ICD-10-CM | POA: Diagnosis not present

## 2019-07-18 DIAGNOSIS — I10 Essential (primary) hypertension: Secondary | ICD-10-CM | POA: Diagnosis not present

## 2019-07-18 DIAGNOSIS — E083499 Diabetes mellitus due to underlying condition with severe nonproliferative diabetic retinopathy without macular edema, unspecified eye: Secondary | ICD-10-CM | POA: Diagnosis not present

## 2019-07-18 DIAGNOSIS — E7800 Pure hypercholesterolemia, unspecified: Secondary | ICD-10-CM | POA: Diagnosis not present

## 2019-07-18 DIAGNOSIS — E0842 Diabetes mellitus due to underlying condition with diabetic polyneuropathy: Secondary | ICD-10-CM | POA: Diagnosis not present

## 2019-07-26 ENCOUNTER — Telehealth: Payer: Self-pay

## 2019-07-26 NOTE — Telephone Encounter (Signed)
CONFIRMED 07-28-19 APPOINTMENT AND SCREENED PATIENT.

## 2019-07-28 ENCOUNTER — Ambulatory Visit: Payer: Medicare Other | Admitting: Nurse Practitioner

## 2019-07-28 ENCOUNTER — Telehealth: Payer: Self-pay

## 2019-07-28 NOTE — Telephone Encounter (Signed)
CONFIRMED AND SCREENED PATIENT FOR 08-01-19 OV AT THE TIME APPOINTMENT WAS MADE.

## 2019-08-01 ENCOUNTER — Ambulatory Visit (INDEPENDENT_AMBULATORY_CARE_PROVIDER_SITE_OTHER): Payer: Medicare Other | Admitting: Nurse Practitioner

## 2019-08-01 ENCOUNTER — Encounter: Payer: Self-pay | Admitting: Nurse Practitioner

## 2019-08-01 ENCOUNTER — Other Ambulatory Visit: Payer: Self-pay

## 2019-08-01 VITALS — BP 145/73 | HR 70 | Temp 98.4°F | Resp 16 | Ht 66.0 in | Wt 174.0 lb

## 2019-08-01 DIAGNOSIS — J301 Allergic rhinitis due to pollen: Secondary | ICD-10-CM

## 2019-08-01 DIAGNOSIS — J4520 Mild intermittent asthma, uncomplicated: Secondary | ICD-10-CM | POA: Diagnosis not present

## 2019-08-01 DIAGNOSIS — I2510 Atherosclerotic heart disease of native coronary artery without angina pectoris: Secondary | ICD-10-CM

## 2019-08-01 DIAGNOSIS — E1165 Type 2 diabetes mellitus with hyperglycemia: Secondary | ICD-10-CM | POA: Diagnosis not present

## 2019-08-01 DIAGNOSIS — I10 Essential (primary) hypertension: Secondary | ICD-10-CM

## 2019-08-01 DIAGNOSIS — I2583 Coronary atherosclerosis due to lipid rich plaque: Secondary | ICD-10-CM

## 2019-08-01 MED ORDER — LORATADINE 10 MG PO TABS: 10.0000 mg | ORAL_TABLET | Freq: Every day | ORAL | 3 refills | Status: DC

## 2019-08-01 NOTE — Progress Notes (Signed)
[facility],  [place] [address]  Internal MEDICINE  Office Visit Note  Patient Name: Daniel Hodges  [id number]  [id number]  Date of Service: 08/14/2019  Chief Complaint  Patient presents with  . Diabetes    sugar levels are dropping at nighttime   . Hypertension    The patient is here for follow up visit. He presents to the office with his wife. They report patient has been having some low blood sugar readings in the overnight period and first thing in the morning. He is seeing an endocrinologist for blood sugar control. His blood pressure is generally well managed. He does see cardiology. He is complaining of nasal allergies and mild, persistent asthma. He has had no flares in relationship to his asthma, however, he states that his nose will not stop running.       Current Medication: Outpatient Encounter Medications as of 08/01/2019  Medication Sig  . aspirin 81 MG tablet Take 81 mg by mouth daily.  Marland Kitchen atorvastatin (LIPITOR) 20 MG tablet Take 1 tablet (20 mg total) by mouth at bedtime.  . budesonide-formoterol (SYMBICORT) 160-4.5 MCG/ACT inhaler Inhale 2 puffs into the lungs 2 (two) times daily.   . cholecalciferol (VITAMIN D3) 25 MCG (1000 UT) tablet Take 1,000 Units by mouth daily.  . ferrous sulfate 325 (65 FE) MG tablet Take 1 tablet (325 mg total) by mouth 2 (two) times daily with a meal.  . furosemide (LASIX) 40 MG tablet Take 0.5 tablets (20 mg total) by mouth daily.  Marland Kitchen gabapentin (NEURONTIN) 300 MG capsule TAKE 1 CAPSULE BY MOUTH DAILY AND 1 CAPSULE NIGHTLY  . HUMALOG KWIKPEN 100 UNIT/ML KwikPen Inject 6-10 Units into the skin See admin instructions. Inject 6u under the skin daily at breakfast-time, 8u at lunch-time and inject 10u under the skin daily at dinner-time  . ipratropium-albuterol (DUONEB) 0.5-2.5 (3) MG/3ML SOLN USE 3 ML VIA NEBULIZER EVERY 6 HOURS AS NEEDED  . loratadine (CLARITIN) 10 MG tablet Take 1 tablet (10 mg total)  by mouth daily.  . Magnesium 250 MG TABS Take 250 mg by mouth 2 (two) times daily.  . metFORMIN (GLUMETZA) 1000 MG (MOD) 24 hr tablet Take 1 tablet (1,000 mg total) by mouth daily with breakfast.  . mometasone (NASONEX) 50 MCG/ACT nasal spray Place 2 sprays into the nose daily.  . montelukast (SINGULAIR) 10 MG tablet Take 1 tablet (10 mg total) by mouth daily.  . Multiple Vitamin (MULTIVITAMIN WITH MINERALS) TABS tablet Take 1 tablet by mouth daily.  . niacin 500 MG CR capsule Take 1 capsule (500 mg total) by mouth at bedtime. (Patient taking differently: Take 500 mg by mouth daily. )  . ONE TOUCH ULTRA TEST test strip USE THREE TIMES DAILY  . senna (SENOKOT) 8.6 MG tablet Take 1 tablet (8.6 mg total) by mouth daily.  . simethicone (MYLICON) 80 MG chewable tablet Chew 2 tablets (160 mg total) by mouth 2 (two) times daily.  [name] SOLOSTAR 300 UNIT/ML SOPN Inject 25 Units into the skin at bedtime.   . vitamin B-12 (CYANOCOBALAMIN) 1000 MCG tablet Take 1,000 mcg by mouth daily.  . [DISCONTINUED] loratadine (CLARITIN) 10 MG tablet Take 1 tablet (10 mg total) by mouth daily.   No facility-administered encounter medications on file as of 08/01/2019.     Surgical History: Past Surgical History:  Procedure Laterality Date  . APPENDECTOMY    . COLONOSCOPY WITH PROPOFOL N/A 06/11/2015   Procedure: COLONOSCOPY WITH PROPOFOL;  Surgeon: Manya Silvas, MD;  Location: [facility] ENDOSCOPY;  Service: Endoscopy;  Laterality: N/A;  . CORONARY ARTERY BYPASS GRAFT    . HERNIA REPAIR    . TEE WITHOUT CARDIOVERSION    . TRACHEOSTOMY    . VASCULAR SURGERY      Medical History: Past Medical History:  Diagnosis Date  . Anginal pain ([place])   . Asthma   . Coronary artery disease   . Diabetes mellitus without complication ([place])   . Hyperlipidemia   . Hypertension   . Sleep apnea     Family History: Family History  Problem Relation Age of Onset  . Cancer Sister   . Diabetes Daughter   . Diabetes Son      Social History   Socioeconomic History  . Marital status: Married    Spouse name: Not on file  . Number of children: Not on file  . Years of education: Not on file  . Highest education level: Not on file  Occupational History  . Not on file  Social Needs  . Financial resource strain: Not on file  . Food insecurity    Worry: Not on file    Inability: Not on file  . Transportation needs    Medical: Not on file    Non-medical: Not on file  Tobacco Use  . Smoking status: Never Smoker  . Smokeless tobacco: Never Used  Substance and Sexual Activity  . Alcohol use: No  . Drug use: No  . Sexual activity: Not on file  Lifestyle  . Physical activity    Days per week: Not on file    Minutes per session: Not on file  . Stress: Not on file  Relationships  . Social Herbalist on phone: Not on file    Gets together: Not on file    Attends religious service: Not on file    Active member of club or organization: Not on file    Attends meetings of clubs or organizations: Not on file    Relationship status: Not on file  . Intimate partner violence    Fear of current or ex partner: Not on file    Emotionally abused: Not on file    Physically abused: Not on file    Forced sexual activity: Not on file  Other Topics Concern  . Not on file  Social History Narrative  . Not on file      Review of Systems  Constitutional: Negative for activity change, chills, fatigue and unexpected weight change.  HENT: Positive for congestion, postnasal drip and rhinorrhea. Negative for sneezing and sore throat.   Respiratory: Negative for cough, chest tightness, shortness of breath and wheezing.   Cardiovascular: Negative for chest pain and palpitations.  Gastrointestinal: Negative for abdominal pain, constipation, diarrhea, nausea and vomiting.  Endocrine: Negative for cold intolerance, heat intolerance, polydipsia and polyuria.       Fluctuating blood sugars. Patient is seeing  endocrinologist for diabetic control.   Musculoskeletal: Negative for arthralgias, back pain, joint swelling and neck pain.  Skin: Negative for rash.  Allergic/Immunologic: Positive for environmental allergies.  Neurological: Negative for dizziness, tremors, numbness and headaches.  Hematological: Negative for adenopathy. Does not bruise/bleed easily.  Psychiatric/Behavioral: Negative for behavioral problems (Depression), sleep disturbance and suicidal ideas. The patient is not nervous/anxious.     Today's Vitals   08/01/19 1556  BP: (!) 145/73  Pulse: 70  Resp: 16  Temp: 98.4 F (36.9 C)  SpO2: 94%  Weight: 174 lb (78.9 kg)  Height: 5\' 6"  (1.676 m)   Body mass index is 28.08 kg/m.  Physical Exam Vitals signs and nursing note reviewed.  Constitutional:      General: He is not in acute distress.    Appearance: Normal appearance. He is well-developed. He is not diaphoretic.  HENT:     Head: Normocephalic and atraumatic.     Right Ear: External ear normal.     Left Ear: External ear normal.     Nose: Congestion present.     Mouth/Throat:     Pharynx: No oropharyngeal exudate.  Eyes:     Extraocular Movements: Extraocular movements intact.     Pupils: Pupils are equal, round, and reactive to light.  Neck:     Musculoskeletal: Normal range of motion and neck supple.     Thyroid: No thyromegaly.     Vascular: No carotid bruit or JVD.     Trachea: No tracheal deviation.  Cardiovascular:     Rate and Rhythm: Normal rate. Rhythm irregular.     Heart sounds: Normal heart sounds. No murmur. No friction rub. No gallop.   Pulmonary:     Effort: Pulmonary effort is normal. No respiratory distress.     Breath sounds: Normal breath sounds. No wheezing or rales.  Chest:     Chest wall: No tenderness.  Abdominal:     General: Bowel sounds are normal.     Palpations: Abdomen is soft.     Tenderness: There is no abdominal tenderness.  Musculoskeletal: Normal range of motion.   Lymphadenopathy:     Cervical: No cervical adenopathy.  Skin:    General: Skin is warm and dry.  Neurological:     Mental Status: He is alert and oriented to person, place, and time.     Cranial Nerves: No cranial nerve deficit.  Psychiatric:        Mood and Affect: Mood normal.        Behavior: Behavior normal.        Thought Content: Thought content normal.        Judgment: Judgment normal.    Assessment/Plan: 1. Mild intermittent asthma in adult without complication Well controlled. Continue symbicort twice daily. May use rescue inhaler as needed and as prescribed   2. Non-seasonal allergic rhinitis due to pollen Add claritin 10mg  daily.  - loratadine (CLARITIN) 10 MG tablet; Take 1 tablet (10 mg total) by mouth daily.  Dispense: 90 tablet; Refill: 3  3. Essential hypertension Stable. Continue bp medication as prescribed   4. Coronary artery disease due to lipid rich plaque Regular visits with cardiology as scheduled.  5. Uncontrolled type 2 diabetes mellitus with hyperglycemia ([place]) Continue regular visits with endocrinology as scheduled.   General Counseling: Janiel verbalizes understanding of the findings of todays visit and agrees with plan of treatment. I have discussed any further diagnostic evaluation that may be needed or ordered today. We also reviewed his medications today. he has been encouraged to call the office with any questions or concerns that should arise related to todays visit.   This patient was seen by [place] with Dr Lavera Guise as a part of collaborative care agreement  Meds ordered this encounter  Medications  . loratadine (CLARITIN) 10 MG tablet    Sig: Take 1 tablet (10 mg total) by mouth daily.    Dispense:  90 tablet    Refill:  3    Order Specific Question:  Supervising Provider    Answer:   Lavera Guise [[id number]]    Time spent: 25 Minutes      Dr Lavera Guise Internal medicine

## 2019-08-05 ENCOUNTER — Other Ambulatory Visit: Payer: Self-pay | Admitting: Nurse Practitioner

## 2019-08-05 DIAGNOSIS — Z0001 Encounter for general adult medical examination with abnormal findings: Secondary | ICD-10-CM | POA: Diagnosis not present

## 2019-08-05 DIAGNOSIS — D509 Iron deficiency anemia, unspecified: Secondary | ICD-10-CM | POA: Diagnosis not present

## 2019-08-05 DIAGNOSIS — I10 Essential (primary) hypertension: Secondary | ICD-10-CM | POA: Diagnosis not present

## 2019-08-06 LAB — COMPREHENSIVE METABOLIC PANEL
ALT: 21 IU/L (ref 0–44)
AST: 21 IU/L (ref 0–40)
Albumin/Globulin Ratio: 1.6 (ref 1.2–2.2)
Albumin: 4.6 g/dL (ref 3.7–4.7)
Alkaline Phosphatase: 126 IU/L — ABNORMAL HIGH (ref 39–117)
BUN/Creatinine Ratio: 15 (ref 10–24)
BUN: 21 mg/dL (ref 8–27)
Bilirubin Total: 0.6 mg/dL (ref 0.0–1.2)
CO2: 22 mmol/L (ref 20–29)
Calcium: 9.3 mg/dL (ref 8.6–10.2)
Chloride: 99 mmol/L (ref 96–106)
Creatinine, Ser: 1.4 mg/dL — ABNORMAL HIGH (ref 0.76–1.27)
GFR calc Af Amer: 55 mL/min/{1.73_m2} — ABNORMAL LOW (ref >59)
GFR calc non Af Amer: 48 mL/min/{1.73_m2} — ABNORMAL LOW (ref >59)
Globulin, Total: 2.9 g/dL (ref 1.5–4.5)
Glucose: 165 mg/dL — ABNORMAL HIGH (ref 65–99)
Potassium: 5.3 mmol/L — ABNORMAL HIGH (ref 3.5–5.2)
Sodium: 140 mmol/L (ref 134–144)
Total Protein: 7.5 g/dL (ref 6.0–8.5)

## 2019-08-06 LAB — CBC
Hematocrit: 41.5 % (ref 37.5–51.0)
Hemoglobin: 13.5 g/dL (ref 13.0–17.7)
MCH: 26.5 pg — ABNORMAL LOW (ref 26.6–33.0)
MCHC: 32.5 g/dL (ref 31.5–35.7)
MCV: 81 fL (ref 79–97)
Platelets: 242 10*3/uL (ref 150–450)
RBC: 5.1 x10E6/uL (ref 4.14–5.80)
RDW: 12.9 % (ref 11.6–15.4)
WBC: 6.3 10*3/uL (ref 3.4–10.8)

## 2019-08-06 LAB — LIPID PANEL W/O CHOL/HDL RATIO
Cholesterol, Total: 104 mg/dL (ref 100–199)
HDL: 61 mg/dL (ref >39)
LDL Chol Calc (NIH): 31 mg/dL (ref 0–99)
Triglycerides: 48 mg/dL (ref 0–149)
VLDL Cholesterol Cal: 12 mg/dL (ref 5–40)

## 2019-08-06 LAB — PSA: Prostate Specific Ag, Serum: 2.8 ng/mL (ref 0.0–4.0)

## 2019-08-06 LAB — TSH: TSH: 3.08 u[IU]/mL (ref 0.450–4.500)

## 2019-08-06 LAB — IRON AND TIBC
Iron Saturation: 28 % (ref 15–55)
Iron: 84 ug/dL (ref 38–169)
Total Iron Binding Capacity: 301 ug/dL (ref 250–450)
UIBC: 217 ug/dL (ref 111–343)

## 2019-08-06 LAB — T4, FREE: Free T4: 1.33 ng/dL (ref 0.82–1.77)

## 2019-08-06 LAB — B12 AND FOLATE PANEL
Folate: >20 ng/mL (ref >3.0)
Vitamin B-12: 1658 pg/mL — ABNORMAL HIGH (ref 232–1245)

## 2019-08-06 LAB — FERRITIN: Ferritin: 67 ng/mL (ref 30–400)

## 2019-08-16 ENCOUNTER — Other Ambulatory Visit: Payer: Self-pay

## 2019-08-16 DIAGNOSIS — J301 Allergic rhinitis due to pollen: Secondary | ICD-10-CM

## 2019-08-16 MED ORDER — LORATADINE 10 MG PO TABS: 10.0000 mg | ORAL_TABLET | Freq: Every day | ORAL | 0 refills | Status: DC

## 2019-08-16 MED ORDER — LORATADINE 10 MG PO TABS: 10.0000 mg | ORAL_TABLET | Freq: Every day | ORAL | 3 refills | Status: DC

## 2019-08-22 ENCOUNTER — Ambulatory Visit: Payer: Medicare Other | Admitting: Nurse Practitioner

## 2019-08-25 ENCOUNTER — Emergency Department
Admission: EM | Admit: 2019-08-25 | Discharge: 2019-08-25 | Disposition: A | Discharge status: Home / Self Care | Payer: Medicare Other | Attending: Emergency Medicine | Admitting: Emergency Medicine

## 2019-08-25 ENCOUNTER — Other Ambulatory Visit: Payer: Self-pay

## 2019-08-25 ENCOUNTER — Encounter: Payer: Self-pay | Admitting: Intensive Care

## 2019-08-25 DIAGNOSIS — Z7982 Long term (current) use of aspirin: Secondary | ICD-10-CM | POA: Diagnosis not present

## 2019-08-25 DIAGNOSIS — R55 Syncope and collapse: Secondary | ICD-10-CM | POA: Diagnosis not present

## 2019-08-25 DIAGNOSIS — Z743 Need for continuous supervision: Secondary | ICD-10-CM | POA: Diagnosis not present

## 2019-08-25 DIAGNOSIS — Z794 Long term (current) use of insulin: Secondary | ICD-10-CM | POA: Insufficient documentation

## 2019-08-25 DIAGNOSIS — E119 Type 2 diabetes mellitus without complications: Secondary | ICD-10-CM | POA: Insufficient documentation

## 2019-08-25 DIAGNOSIS — I2510 Atherosclerotic heart disease of native coronary artery without angina pectoris: Secondary | ICD-10-CM | POA: Diagnosis not present

## 2019-08-25 DIAGNOSIS — Z79899 Other long term (current) drug therapy: Secondary | ICD-10-CM | POA: Insufficient documentation

## 2019-08-25 DIAGNOSIS — R42 Dizziness and giddiness: Secondary | ICD-10-CM | POA: Diagnosis not present

## 2019-08-25 DIAGNOSIS — J45909 Unspecified asthma, uncomplicated: Secondary | ICD-10-CM | POA: Insufficient documentation

## 2019-08-25 DIAGNOSIS — R0902 Hypoxemia: Secondary | ICD-10-CM | POA: Diagnosis not present

## 2019-08-25 DIAGNOSIS — I10 Essential (primary) hypertension: Secondary | ICD-10-CM | POA: Diagnosis not present

## 2019-08-25 LAB — CBC
HCT: 40.5 % (ref 39.0–52.0)
Hemoglobin: 12.9 g/dL — ABNORMAL LOW (ref 13.0–17.0)
MCH: 27.2 pg (ref 26.0–34.0)
MCHC: 31.9 g/dL (ref 30.0–36.0)
MCV: 85.3 fL (ref 80.0–100.0)
Platelets: 196 10*3/uL (ref 150–400)
RBC: 4.75 MIL/uL (ref 4.22–5.81)
RDW: 13.8 % (ref 11.5–15.5)
WBC: 9.4 10*3/uL (ref 4.0–10.5)
nRBC: 0 % (ref 0.0–0.2)

## 2019-08-25 LAB — BASIC METABOLIC PANEL
Anion gap: 11 (ref 5–15)
BUN: 18 mg/dL (ref 8–23)
CO2: 23 mmol/L (ref 22–32)
Calcium: 9.1 mg/dL (ref 8.9–10.3)
Chloride: 102 mmol/L (ref 98–111)
Creatinine, Ser: 1.17 mg/dL (ref 0.61–1.24)
GFR calc Af Amer: >60 mL/min (ref >60)
GFR calc non Af Amer: 59 mL/min — ABNORMAL LOW (ref >60)
Glucose, Bld: 218 mg/dL — ABNORMAL HIGH (ref 70–99)
Potassium: 4.8 mmol/L (ref 3.5–5.1)
Sodium: 136 mmol/L (ref 135–145)

## 2019-08-25 LAB — TROPONIN I (HIGH SENSITIVITY): Troponin I (High Sensitivity): 4 ng/L (ref <18)

## 2019-08-25 NOTE — ED Notes (Signed)
Patient given water per MD

## 2019-08-25 NOTE — ED Notes (Signed)
ED Provider at bedside. 

## 2019-08-25 NOTE — ED Triage Notes (Signed)
First RN Note: Pt presents to ED via [facility] from home, per EMS initially called out for choking, per EMS upon arrival pt c/o dizziness, EMS reports pt c/o dizziness with sitting up. EMS reports pt is A&O at this time. EMS reports VSS en route.   BP 101/56 HR 80 NSR CBG 208 97.7 Oral 98% RA

## 2019-08-25 NOTE — ED Notes (Signed)
Patient continues to have questions concerning follow-up with cardiologist. This RN assisted patient to set up follow-up appointment with cardiology.

## 2019-08-25 NOTE — ED Notes (Signed)
Called lab to add on trop. State they will run now.

## 2019-08-25 NOTE — ED Notes (Signed)
Patient changed into paper scrubs and given socks and shoes. Taken in wheel chair to car by EDT. Signature pad not working so unable to obtain discharge signature.

## 2019-08-25 NOTE — ED Triage Notes (Signed)
Patient c/o dizziness that started this AM. Denies any other symptoms

## 2019-08-26 ENCOUNTER — Other Ambulatory Visit: Payer: Self-pay

## 2019-08-26 DIAGNOSIS — J301 Allergic rhinitis due to pollen: Secondary | ICD-10-CM

## 2019-08-26 DIAGNOSIS — G4733 Obstructive sleep apnea (adult) (pediatric): Secondary | ICD-10-CM | POA: Diagnosis not present

## 2019-08-26 MED ORDER — LORATADINE 10 MG PO TABS: 10.0000 mg | ORAL_TABLET | Freq: Every day | ORAL | 1 refills | Status: DC

## 2019-08-26 NOTE — ED Provider Notes (Signed)
[facility] Emergency Department Provider Note   ____________________________________________    I have reviewed the triage vital signs and the nursing notes.   HISTORY  Chief Complaint Dizziness     HPI Daniel Hodges is a 78 y.o. male with a history of coronary artery disease, diabetes, hypertension who presents after a syncopal episode.  Patient notes that after drinking tea he felt lightheaded and apparently slumped over per his wife.  He denies any chest pain.  No nausea or vomiting.  Currently he feels well and has no complaints.  He did not want to come to the emergency department.  Denies shortness of breath.  No dizziness at this time.  Otherwise felt well this morning  Past Medical History:  Diagnosis Date  . Anginal pain ([place])   . Asthma   . Coronary artery disease   . Diabetes mellitus without complication ([place])   . Hyperlipidemia   . Hypertension   . Sleep apnea     Patient Active Problem List   Diagnosis Date Noted  . Small bowel obstruction due to adhesions ([place]) 03/12/2019  . Pleural effusion 08/06/2018  . Encounter for general adult medical examination with abnormal findings 03/20/2018  . Chronic otitis externa of both ears 03/20/2018  . Uncontrolled type 2 diabetes mellitus with hypoglycemia ([place]) 03/20/2018  . Dysuria 03/20/2018  . SOB (shortness of breath) 01/22/2018  . Cough 01/14/2018  . Constipation 12/30/2017  . Non-seasonal allergic rhinitis due to pollen 12/30/2017  . Dehydration 12/30/2017  . Essential hypertension 12/29/2017  . Uncontrolled type 2 diabetes mellitus with hyperglycemia ([place]) 12/27/2017  . Acute upper respiratory infection 12/27/2017  . Need for vaccination against Streptococcus pneumoniae using pneumococcal conjugate vaccine 13 12/27/2017  . Coronary artery disease due to lipid rich plaque 12/27/2017  . Mixed hyperlipidemia 12/27/2017  . AKI (acute kidney injury) ([place]) 12/20/2017  . OSA on  CPAP 02/01/2016  . Aortic ejection murmur 08/14/2015  . 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 Ileus ([place]) 09/20/2013  . Anemia 09/19/2013  . Hypercarbia 09/19/2013  . Postoperative anemia due to acute blood loss 09/06/2013  . Thrombocytopenia ([place]) 09/06/2013  . 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 Abnormal stress ECG 09/01/2013  . Asthma in adult 09/01/2013  . S/P appendectomy 09/01/2013    Past Surgical History:  Procedure Laterality Date  . APPENDECTOMY    . COLONOSCOPY WITH PROPOFOL N/A 06/11/2015   Procedure: COLONOSCOPY WITH PROPOFOL;  Surgeon: Manya Silvas, MD;  Location: [facility] ENDOSCOPY;  Service: Endoscopy;  Laterality: N/A;  . CORONARY ARTERY BYPASS GRAFT    . HERNIA REPAIR    . TEE WITHOUT CARDIOVERSION    . TRACHEOSTOMY    . VASCULAR SURGERY      Prior to Admission medications   Medication Sig Start Date End Date Taking? Authorizing Provider  aspirin 81 MG tablet Take 81 mg by mouth daily.    [provider]  atorvastatin (LIPITOR) 20 MG tablet Take 1 tablet (20 mg total) by mouth at bedtime. 04/13/19   Ronnell Freshwater, NP  budesonide-formoterol (SYMBICORT) 160-4.5 MCG/ACT inhaler Inhale 2 puffs into the lungs 2 (two) times daily.     [provider]  cholecalciferol (VITAMIN D3) 25 MCG (1000 UT) tablet Take 1,000 Units by mouth daily.    [provider]  ferrous sulfate 325 (65 FE) MG tablet Take 1 tablet (325 mg total) by mouth 2 (two) times daily with a meal. 03/11/19  Ronnell Freshwater, NP  furosemide (LASIX) 40 MG tablet Take 0.5 tablets (20 mg total) by mouth daily. 06/23/19   Ronnell Freshwater, NP  gabapentin (NEURONTIN) 300 MG capsule TAKE 1 CAPSULE BY MOUTH DAILY AND 1 CAPSULE NIGHTLY 06/21/19   Boscia, Heather E, NP  HUMALOG KWIKPEN 100 UNIT/ML KwikPen Inject 6-10 Units into the skin See admin instructions. Inject 6u under the skin daily at breakfast-time, 8u at lunch-time and inject 10u under the skin daily at  dinner-time 11/23/18   [provider]  ipratropium-albuterol (DUONEB) 0.5-2.5 (3) MG/3ML SOLN USE 3 ML VIA NEBULIZER EVERY 6 HOURS AS NEEDED 06/28/19   Kendell Bane, NP  loratadine (CLARITIN) 10 MG tablet Take 1 tablet (10 mg total) by mouth daily. 08/26/19   Ronnell Freshwater, NP  Magnesium 250 MG TABS Take 250 mg by mouth 2 (two) times daily.    [provider]  metFORMIN (GLUMETZA) 1000 MG (MOD) 24 hr tablet Take 1 tablet (1,000 mg total) by mouth daily with breakfast. 10/13/18   Boscia, Greer Ee, NP  mometasone (NASONEX) 50 MCG/ACT nasal spray Place 2 sprays into the nose daily. 03/23/19   Lavera Guise, MD  montelukast (SINGULAIR) 10 MG tablet Take 1 tablet (10 mg total) by mouth daily. 05/02/19   Ronnell Freshwater, NP  Multiple Vitamin (MULTIVITAMIN WITH MINERALS) TABS tablet Take 1 tablet by mouth daily.    [provider]  niacin 500 MG CR capsule Take 1 capsule (500 mg total) by mouth at bedtime. Patient taking differently: Take 500 mg by mouth daily.  03/11/19   Ronnell Freshwater, NP  ONE TOUCH ULTRA TEST test strip USE THREE TIMES DAILY 02/18/18   Ronnell Freshwater, NP  senna (SENOKOT) 8.6 MG tablet Take 1 tablet (8.6 mg total) by mouth daily. 04/15/19   Ronnell Freshwater, NP  simethicone (MYLICON) 80 MG chewable tablet Chew 2 tablets (160 mg total) by mouth 2 (two) times daily. 04/15/19   Boscia, Greer Ee, NP  TOUJEO SOLOSTAR 300 UNIT/ML SOPN Inject 25 Units into the skin at bedtime.  11/23/18   [provider]  vitamin B-12 (CYANOCOBALAMIN) 1000 MCG tablet Take 1,000 mcg by mouth daily.    [provider]     Allergies Penicillins and Neostigmine  Family History  Problem Relation Age of Onset  . Cancer Sister   . Diabetes Daughter   . Diabetes Son     Social History Social History   Tobacco Use  . Smoking status: Never Smoker  . Smokeless tobacco: Never Used  Substance Use Topics  . Alcohol use: No  . Drug use: No    Review  of Systems  Constitutional: No fever/chills Eyes: No visual changes.  ENT: No sore throat. Cardiovascular: Denies chest pain. Respiratory: Denies shortness of breath. Gastrointestinal: No abdominal pain.  No nausea, no vomiting.   Genitourinary: Negative for dysuria. Musculoskeletal: Negative for back pain. Skin: Negative for rash. Neurological: Negative for headaches or weakness   ____________________________________________   PHYSICAL EXAM:  VITAL SIGNS: ED Triage Vitals [08/25/19 0905]  Enc Vitals Group     BP (!) 113/52     Pulse Rate 76     Resp 16     Temp 97.7 F (36.5 C)     Temp Source Oral     SpO2 100 %     Weight 77.1 kg (170 lb)     Height 1.676 m (5\' 6" )     Head Circumference  Peak Flow      Pain Score 0     Pain Loc      Pain Edu?      Excl. in [place]?     Constitutional: Alert and oriented.   Nose: No congestion/rhinnorhea. Mouth/Throat: Mucous membranes are moist.    Cardiovascular: Normal rate, regular rhythm. Grossly normal heart sounds.  Good peripheral circulation. Respiratory: Normal respiratory effort.  No retractions. Lungs CTAB. Gastrointestinal: Soft and nontender. No distention.  No CVA tenderness.  Musculoskeletal: No lower extremity tenderness nor edema.  Warm and well perfused Neurologic:  Normal speech and language. No gross focal neurologic deficits are appreciated.  Skin:  Skin is warm, dry and intact. No rash noted. Psychiatric: Mood and affect are normal. Speech and behavior are normal.  ____________________________________________   LABS (all labs ordered are listed, but only abnormal results are displayed)  Labs Reviewed  BASIC METABOLIC PANEL - Abnormal; Notable for the following components:      Result Value   Glucose, Bld 218 (*)    GFR calc non Af Amer 59 (*)    All other components within normal limits  CBC - Abnormal; Notable for the following components:   Hemoglobin 12.9 (*)    All other components within  normal limits  TROPONIN I (HIGH SENSITIVITY)   ____________________________________________  EKG  ED ECG REPORT I, Lavonia Drafts, the attending physician, personally viewed and interpreted this ECG.  Date: 08/26/2019  Rhythm: normal sinus rhythm QRS Axis: normal Intervals: Right bundle branch block, old ST/T Wave abnormalities: normal Narrative Interpretation: no evidence of acute ischemia  ____________________________________________  RADIOLOGY  None ____________________________________________   PROCEDURES  Procedure(s) performed: No  Procedures   Critical Care performed: No ____________________________________________   INITIAL IMPRESSION / ASSESSMENT AND PLAN / ED COURSE  Pertinent labs & imaging results that were available during my care of the patient were reviewed by me and considered in my medical decision making (see chart for details).  Patient presents after what sounds like a near syncopal episode.  Feeling quite well now and anxious to leave.  Lab work is overall reassuring.  No chest pain, palpitations.  However unclear cause of syncope.  EKG is unchanged from prior.  Recommended adding on heart enzyme however patient is anxious to leave and would like to forego further testing, he understands that unable to determine whether any ischemia without further lab testing but he says he feels well and would like to leave as he can follow-up with his cardiologist    ____________________________________________   FINAL CLINICAL IMPRESSION(S) / ED DIAGNOSES  Final diagnoses:  Near syncope        Note:  This document was prepared using Dragon voice recognition software and may include unintentional dictation errors.   Lavonia Drafts, MD 08/26/19 2155

## 2019-08-29 DIAGNOSIS — I10 Essential (primary) hypertension: Secondary | ICD-10-CM | POA: Diagnosis not present

## 2019-08-29 DIAGNOSIS — I2510 Atherosclerotic heart disease of native coronary artery without angina pectoris: Secondary | ICD-10-CM | POA: Diagnosis not present

## 2019-08-29 DIAGNOSIS — E782 Mixed hyperlipidemia: Secondary | ICD-10-CM | POA: Diagnosis not present

## 2019-08-29 DIAGNOSIS — R55 Syncope and collapse: Secondary | ICD-10-CM | POA: Diagnosis not present

## 2019-09-06 ENCOUNTER — Other Ambulatory Visit: Payer: Self-pay

## 2019-09-06 DIAGNOSIS — J301 Allergic rhinitis due to pollen: Secondary | ICD-10-CM

## 2019-09-06 MED ORDER — LORATADINE 10 MG PO TABS: 10.0000 mg | ORAL_TABLET | Freq: Every day | ORAL | 1 refills | Status: DC

## 2019-09-13 ENCOUNTER — Other Ambulatory Visit: Payer: Self-pay

## 2019-09-13 MED ORDER — FUROSEMIDE 40 MG PO TABS: 20.0000 mg | ORAL_TABLET | Freq: Every day | ORAL | 1 refills | Status: DC

## 2019-09-15 ENCOUNTER — Ambulatory Visit (INDEPENDENT_AMBULATORY_CARE_PROVIDER_SITE_OTHER): Payer: Medicare Other | Admitting: Adult Health

## 2019-09-15 ENCOUNTER — Other Ambulatory Visit: Payer: Self-pay

## 2019-09-15 ENCOUNTER — Encounter: Payer: Self-pay | Admitting: Adult Health

## 2019-09-15 ENCOUNTER — Other Ambulatory Visit: Payer: Self-pay | Admitting: Adult Health

## 2019-09-15 VITALS — BP 142/76 | HR 80 | Temp 97.6°F | Resp 16 | Ht 66.0 in | Wt 177.0 lb

## 2019-09-15 DIAGNOSIS — J4520 Mild intermittent asthma, uncomplicated: Secondary | ICD-10-CM | POA: Diagnosis not present

## 2019-09-15 DIAGNOSIS — I2583 Coronary atherosclerosis due to lipid rich plaque: Secondary | ICD-10-CM

## 2019-09-15 DIAGNOSIS — I2510 Atherosclerotic heart disease of native coronary artery without angina pectoris: Secondary | ICD-10-CM | POA: Diagnosis not present

## 2019-09-15 DIAGNOSIS — I10 Essential (primary) hypertension: Secondary | ICD-10-CM | POA: Diagnosis not present

## 2019-09-15 DIAGNOSIS — E1165 Type 2 diabetes mellitus with hyperglycemia: Secondary | ICD-10-CM | POA: Diagnosis not present

## 2019-09-15 MED ORDER — AMLODIPINE BESYLATE 2.5 MG PO TABS: 2.5000 mg | ORAL_TABLET | Freq: Every day | ORAL | 0 refills | Status: DC

## 2019-09-15 NOTE — Progress Notes (Signed)
[facility] [place], [place] [address]  Internal MEDICINE  Office Visit Note  Patient Name: Daniel Hodges  [id number]  [id number]  Date of Service: 09/15/2019  Chief Complaint  Patient presents with  . Hospitalization Follow-up    near syncope, seeing cardiologist in january, had a heart monitor, will be doing a stress test, was questioned why he is not on bp meds    HPI  Pt is here for hospital follow up. He called EMS on 12/10 for dizziness, and possible choking. He described a syncopal episode that morning after breakfast.  In the ER.  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  He was sent home to follow up with cardiologist for stress test.  Since home from hospital he has been at baseline.  He denies any further episodes of syncope.       Current Medication: Outpatient Encounter Medications as of 09/15/2019  Medication Sig  . aspirin 81 MG tablet Take 81 mg by mouth daily.  Marland Kitchen atorvastatin (LIPITOR) 20 MG tablet Take 1 tablet (20 mg total) by mouth at bedtime.  . budesonide-formoterol (SYMBICORT) 160-4.5 MCG/ACT inhaler Inhale 2 puffs into the lungs 2 (two) times daily.   . cholecalciferol (VITAMIN D3) 25 MCG (1000 UT) tablet Take 1,000 Units by mouth daily.  . ferrous sulfate 325 (65 FE) MG tablet Take 1 tablet (325 mg total) by mouth 2 (two) times daily with a meal.  . furosemide (LASIX) 40 MG tablet Take 0.5 tablets (20 mg total) by mouth daily.  Marland Kitchen gabapentin (NEURONTIN) 300 MG capsule TAKE 1 CAPSULE BY MOUTH DAILY AND 1 CAPSULE NIGHTLY  . HUMALOG KWIKPEN 100 UNIT/ML KwikPen Inject 6-10 Units into the skin See admin instructions. Inject 6u under the skin daily at breakfast-time, 8u at lunch-time and inject 10u under the skin daily at dinner-time  . ipratropium-albuterol (DUONEB) 0.5-2.5 (3) MG/3ML SOLN USE 3 ML VIA NEBULIZER EVERY 6 HOURS AS NEEDED  . loratadine (CLARITIN) 10 MG tablet Take 1  tablet (10 mg total) by mouth daily.  . Magnesium 250 MG TABS Take 250 mg by mouth 2 (two) times daily.  . metFORMIN (GLUMETZA) 1000 MG (MOD) 24 hr tablet Take 1 tablet (1,000 mg total) by mouth daily with breakfast.  . mometasone (NASONEX) 50 MCG/ACT nasal spray Place 2 sprays into the nose daily.  . montelukast (SINGULAIR) 10 MG tablet Take 1 tablet (10 mg total) by mouth daily.  . Multiple Vitamin (MULTIVITAMIN WITH MINERALS) TABS tablet Take 1 tablet by mouth daily.  . niacin 500 MG CR capsule Take 1 capsule (500 mg total) by mouth at bedtime. (Patient taking differently: Take 500 mg by mouth daily. )  . ONE TOUCH ULTRA TEST test strip USE THREE TIMES DAILY  . senna (SENOKOT) 8.6 MG tablet Take 1 tablet (8.6 mg total) by mouth daily.  . simethicone (MYLICON) 80 MG chewable tablet Chew 2 tablets (160 mg total) by mouth 2 (two) times daily.  [name] SOLOSTAR 300 UNIT/ML SOPN Inject 25 Units into the skin at bedtime.   . vitamin B-12 (CYANOCOBALAMIN) 1000 MCG tablet Take 1,000 mcg by mouth daily.  Marland Kitchen amLODipine (NORVASC) 2.5 MG tablet Take 1 tablet (2.5 mg total) by mouth daily.   No facility-administered encounter medications on file as of 09/15/2019.    Surgical History: Past Surgical History:  Procedure Laterality Date  . APPENDECTOMY    . COLONOSCOPY WITH PROPOFOL N/A  06/11/2015   Procedure: COLONOSCOPY WITH PROPOFOL;  Surgeon: Manya Silvas, MD;  Location: [facility] ENDOSCOPY;  Service: Endoscopy;  Laterality: N/A;  . CORONARY ARTERY BYPASS GRAFT    . HERNIA REPAIR    . TEE WITHOUT CARDIOVERSION    . TRACHEOSTOMY    . VASCULAR SURGERY      Medical History: Past Medical History:  Diagnosis Date  . Anginal pain ([place])   . Asthma   . Coronary artery disease   . Diabetes mellitus without complication ([place])   . Hyperlipidemia   . Hypertension   . Sleep apnea     Family History: Family History  Problem Relation Age of Onset  . Cancer Sister   . Diabetes Daughter   .  Diabetes Son     Social History   Socioeconomic History  . Marital status: Married    Spouse name: Not on file  . Number of children: Not on file  . Years of education: Not on file  . Highest education level: Not on file  Occupational History  . Not on file  Tobacco Use  . Smoking status: Never Smoker  . Smokeless tobacco: Never Used  Substance and Sexual Activity  . Alcohol use: No  . Drug use: No  . Sexual activity: Not on file  Other Topics Concern  . Not on file  Social History Narrative  . Not on file   Social Determinants of Health   Financial Resource Strain:   . Difficulty of Paying Living Expenses: Not on file  Food Insecurity:   . Worried About Charity fundraiser in the Last Year: Not on file  . Ran Out of Food in the Last Year: Not on file  Transportation Needs:   . Lack of Transportation (Medical): Not on file  . Lack of Transportation (Non-Medical): Not on file  Physical Activity:   . Days of Exercise per Week: Not on file  . Minutes of Exercise per Session: Not on file  Stress:   . Feeling of Stress : Not on file  Social Connections:   . Frequency of Communication with Friends and Family: Not on file  . Frequency of Social Gatherings with Friends and Family: Not on file  . Attends Religious Services: Not on file  . Active Member of Clubs or Organizations: Not on file  . Attends Archivist Meetings: Not on file  . Marital Status: Not on file  Intimate Partner Violence:   . Fear of Current or Ex-Partner: Not on file  . Emotionally Abused: Not on file  . Physically Abused: Not on file  . Sexually Abused: Not on file      Review of Systems  Constitutional: Negative.  Negative for chills, fatigue and unexpected weight change.  HENT: Negative.  Negative for congestion, rhinorrhea, sneezing and sore throat.   Eyes: Negative for redness.  Respiratory: Negative.  Negative for cough, chest tightness and shortness of breath.   Cardiovascular:  Negative.  Negative for chest pain and palpitations.  Gastrointestinal: Negative.  Negative for abdominal pain, constipation, diarrhea, nausea and vomiting.  Endocrine: Negative.   Genitourinary: Negative.  Negative for dysuria and frequency.  Musculoskeletal: Negative.  Negative for arthralgias, back pain, joint swelling and neck pain.  Skin: Negative.  Negative for rash.  Allergic/Immunologic: Negative.   Neurological: Negative.  Negative for tremors and numbness.  Hematological: Negative for adenopathy. Does not bruise/bleed easily.  Psychiatric/Behavioral: Negative.  Negative for behavioral problems, sleep disturbance and suicidal ideas. The  patient is not nervous/anxious.     Vital Signs: BP (!) 142/76   Pulse 80   Temp 97.6 F (36.4 C)   Resp 16   Ht 5\' 6"  (1.676 m)   Wt 177 lb (80.3 kg)   SpO2 96%   BMI 28.57 kg/m    Physical Exam Vitals and nursing note reviewed.  Constitutional:      General: He is not in acute distress.    Appearance: He is well-developed. He is not diaphoretic.  HENT:     Head: Normocephalic and atraumatic.     Mouth/Throat:     Pharynx: No oropharyngeal exudate.  Eyes:     Pupils: Pupils are equal, round, and reactive to light.  Neck:     Thyroid: No thyromegaly.     Vascular: No JVD.     Trachea: No tracheal deviation.  Cardiovascular:     Rate and Rhythm: Normal rate and regular rhythm.     Heart sounds: Normal heart sounds. No murmur. No friction rub. No gallop.   Pulmonary:     Effort: Pulmonary effort is normal. No respiratory distress.     Breath sounds: Normal breath sounds. No wheezing or rales.  Chest:     Chest wall: No tenderness.  Abdominal:     Palpations: Abdomen is soft.     Tenderness: There is no abdominal tenderness. There is no guarding.  Musculoskeletal:        General: Normal range of motion.     Cervical back: Normal range of motion and neck supple.  Lymphadenopathy:     Cervical: No cervical adenopathy.   Skin:    General: Skin is warm and dry.  Neurological:     Mental Status: He is alert and oriented to person, place, and time.     Cranial Nerves: No cranial nerve deficit.  Psychiatric:        Behavior: Behavior normal.        Thought Content: Thought content normal.        Judgment: Judgment normal.    Assessment/Plan: 1. Essential hypertension Start low dose Norvasc for intermittent HTN. Monitor bp at home and bring to office.  - amLODipine (NORVASC) 2.5 MG tablet; Take 1 tablet (2.5 mg total) by mouth daily.  Dispense: 30 tablet; Refill: 0  2. 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  3. Coronary artery disease due to lipid rich plaque Follow up with Cardiology as planned. Go back to ER for any further episodes of syncope.   4. Uncontrolled type 2 diabetes mellitus with hyperglycemia ([place]) Continue to follow up with endocrinology as directed.   General Counseling: Doron verbalizes understanding of the findings of todays visit and agrees with plan of treatment. I have discussed any further diagnostic evaluation that may be needed or ordered today. We also reviewed his medications today. he has been encouraged to call the office with any questions or concerns that should arise related to todays visit.    No orders of the defined types were placed in this encounter.   Meds ordered this encounter  Medications  . amLODipine (NORVASC) 2.5 MG tablet    Sig: Take 1 tablet (2.5 mg total) by mouth daily.    Dispense:  30 tablet    Refill:  0    Time spent: 25 Minutes   This patient was seen by Orson Gear AGNP-C in Collaboration with Dr Lavera Guise as a part of collaborative care agreement  Kendell Bane AGNP-C Internal medicine

## 2019-09-21 DIAGNOSIS — I10 Essential (primary) hypertension: Secondary | ICD-10-CM | POA: Diagnosis not present

## 2019-09-21 DIAGNOSIS — I2510 Atherosclerotic heart disease of native coronary artery without angina pectoris: Secondary | ICD-10-CM | POA: Diagnosis not present

## 2019-09-21 DIAGNOSIS — G4730 Sleep apnea, unspecified: Secondary | ICD-10-CM | POA: Diagnosis not present

## 2019-09-21 DIAGNOSIS — E1165 Type 2 diabetes mellitus with hyperglycemia: Secondary | ICD-10-CM | POA: Diagnosis not present

## 2019-09-21 DIAGNOSIS — E7800 Pure hypercholesterolemia, unspecified: Secondary | ICD-10-CM | POA: Diagnosis not present

## 2019-09-22 ENCOUNTER — Telehealth: Payer: Self-pay

## 2019-09-22 NOTE — Telephone Encounter (Signed)
Confirmed appointment with patient. klh °

## 2019-09-26 ENCOUNTER — Encounter: Payer: Self-pay | Admitting: Internal Medicine

## 2019-09-26 ENCOUNTER — Other Ambulatory Visit: Payer: Self-pay

## 2019-09-26 ENCOUNTER — Ambulatory Visit: Payer: Medicare Other | Admitting: Internal Medicine

## 2019-09-26 VITALS — BP 122/74 | HR 97 | Temp 97.5°F | Resp 16 | Ht 66.0 in | Wt 174.0 lb

## 2019-09-26 DIAGNOSIS — G4733 Obstructive sleep apnea (adult) (pediatric): Secondary | ICD-10-CM | POA: Diagnosis not present

## 2019-09-26 DIAGNOSIS — J301 Allergic rhinitis due to pollen: Secondary | ICD-10-CM

## 2019-09-26 DIAGNOSIS — Z9989 Dependence on other enabling machines and devices: Secondary | ICD-10-CM

## 2019-09-26 DIAGNOSIS — J4520 Mild intermittent asthma, uncomplicated: Secondary | ICD-10-CM | POA: Diagnosis not present

## 2019-09-26 DIAGNOSIS — R0602 Shortness of breath: Secondary | ICD-10-CM

## 2019-09-26 DIAGNOSIS — I10 Essential (primary) hypertension: Secondary | ICD-10-CM | POA: Diagnosis not present

## 2019-09-26 NOTE — Progress Notes (Signed)
[facility] [place], [place] [address]  Pulmonary Sleep Medicine   Office Visit Note  Patient Name: Daniel Hodges DOB: 04-29-1941 MRN [id number]  Date of Service: 09/26/2019  Complaints/HPI: Pt is here for pulmonary follow up. Pt reports good compliance with CPAP therapy. Cleaning machine by hand, and changing filters and tubing as directed. Denies headaches, sinus issues, palpitations, or hemoptysis. He is doing well currently.  He continues to use his inhalers without difficulty.  Denies any medication needs at this time.  He has not had any recent issues with allergies or asthma.  Continues to use inhalers and nebulizer as discussed.   ROS  General: (-) fever, (-) chills, (-) night sweats, (-) weakness Skin: (-) rashes, (-) itching,. Eyes: (-) visual changes, (-) redness, (-) itching. Nose and Sinuses: (-) nasal stuffiness or itchiness, (-) postnasal drip, (-) nosebleeds, (-) sinus trouble. Mouth and Throat: (-) sore throat, (-) hoarseness. Neck: (-) swollen glands, (-) enlarged thyroid, (-) neck pain. Respiratory: - cough, (-) bloody sputum, - shortness of breath, - wheezing. Cardiovascular: - ankle swelling, (-) chest pain. Lymphatic: (-) lymph node enlargement. Neurologic: (-) numbness, (-) tingling. Psychiatric: (-) anxiety, (-) depression   Current Medication: Outpatient Encounter Medications as of 09/26/2019  Medication Sig  . amLODipine (NORVASC) 2.5 MG tablet TAKE 1 TABLET(2.5 MG) BY MOUTH DAILY  . aspirin 81 MG tablet Take 81 mg by mouth daily.  Marland Kitchen atorvastatin (LIPITOR) 20 MG tablet Take 1 tablet (20 mg total) by mouth at bedtime.  . budesonide-formoterol (SYMBICORT) 160-4.5 MCG/ACT inhaler Inhale 2 puffs into the lungs 2 (two) times daily.   . cholecalciferol (VITAMIN D3) 25 MCG (1000 UT) tablet Take 1,000 Units by mouth daily.  . ferrous sulfate 325 (65 FE) MG tablet Take 1 tablet (325 mg total) by mouth 2 (two) times daily with a meal.   . furosemide (LASIX) 40 MG tablet Take 0.5 tablets (20 mg total) by mouth daily.  Marland Kitchen gabapentin (NEURONTIN) 300 MG capsule TAKE 1 CAPSULE BY MOUTH DAILY AND 1 CAPSULE NIGHTLY  . HUMALOG KWIKPEN 100 UNIT/ML KwikPen Inject 6-10 Units into the skin See admin instructions. Inject 6u under the skin daily at breakfast-time, 8u at lunch-time and inject 10u under the skin daily at dinner-time  . ipratropium-albuterol (DUONEB) 0.5-2.5 (3) MG/3ML SOLN USE 3 ML VIA NEBULIZER EVERY 6 HOURS AS NEEDED  . loratadine (CLARITIN) 10 MG tablet Take 1 tablet (10 mg total) by mouth daily.  . Magnesium 250 MG TABS Take 250 mg by mouth 2 (two) times daily.  . metFORMIN (GLUMETZA) 1000 MG (MOD) 24 hr tablet Take 1 tablet (1,000 mg total) by mouth daily with breakfast.  . mometasone (NASONEX) 50 MCG/ACT nasal spray Place 2 sprays into the nose daily.  . montelukast (SINGULAIR) 10 MG tablet Take 1 tablet (10 mg total) by mouth daily.  . Multiple Vitamin (MULTIVITAMIN WITH MINERALS) TABS tablet Take 1 tablet by mouth daily.  . niacin 500 MG CR capsule Take 1 capsule (500 mg total) by mouth at bedtime. (Patient taking differently: Take 500 mg by mouth daily. )  . ONE TOUCH ULTRA TEST test strip USE THREE TIMES DAILY  . senna (SENOKOT) 8.6 MG tablet Take 1 tablet (8.6 mg total) by mouth daily.  . simethicone (MYLICON) 80 MG chewable tablet Chew 2 tablets (160 mg total) by mouth 2 (two) times daily.  [name] SOLOSTAR 300 UNIT/ML SOPN Inject 25 Units into the skin at bedtime.   . vitamin  B-12 (CYANOCOBALAMIN) 1000 MCG tablet Take 1,000 mcg by mouth daily.   No facility-administered encounter medications on file as of 09/26/2019.    Surgical History: Past Surgical History:  Procedure Laterality Date  . APPENDECTOMY    . COLONOSCOPY WITH PROPOFOL N/A 06/11/2015   Procedure: COLONOSCOPY WITH PROPOFOL;  Surgeon: Manya Silvas, MD;  Location: [facility] ENDOSCOPY;  Service: Endoscopy;  Laterality: N/A;  . CORONARY ARTERY BYPASS  GRAFT    . HERNIA REPAIR    . TEE WITHOUT CARDIOVERSION    . TRACHEOSTOMY    . VASCULAR SURGERY      Medical History: Past Medical History:  Diagnosis Date  . Anginal pain ([place])   . Asthma   . Coronary artery disease   . Diabetes mellitus without complication ([place])   . Hyperlipidemia   . Hypertension   . Sleep apnea     Family History: Family History  Problem Relation Age of Onset  . Cancer Sister   . Diabetes Daughter   . Diabetes Son     Social History: Social History   Socioeconomic History  . Marital status: Married    Spouse name: Not on file  . Number of children: Not on file  . Years of education: Not on file  . Highest education level: Not on file  Occupational History  . Not on file  Tobacco Use  . Smoking status: Never Smoker  . Smokeless tobacco: Never Used  Substance and Sexual Activity  . Alcohol use: No  . Drug use: No  . Sexual activity: Not on file  Other Topics Concern  . Not on file  Social History Narrative  . Not on file   Social Determinants of Health   Financial Resource Strain:   . Difficulty of Paying Living Expenses: Not on file  Food Insecurity:   . Worried About Charity fundraiser in the Last Year: Not on file  . Ran Out of Food in the Last Year: Not on file  Transportation Needs:   . Lack of Transportation (Medical): Not on file  . Lack of Transportation (Non-Medical): Not on file  Physical Activity:   . Days of Exercise per Week: Not on file  . Minutes of Exercise per Session: Not on file  Stress:   . Feeling of Stress : Not on file  Social Connections:   . Frequency of Communication with Friends and Family: Not on file  . Frequency of Social Gatherings with Friends and Family: Not on file  . Attends Religious Services: Not on file  . Active Member of Clubs or Organizations: Not on file  . Attends Archivist Meetings: Not on file  . Marital Status: Not on file  Intimate Partner Violence:   . Fear of  Current or Ex-Partner: Not on file  . Emotionally Abused: Not on file  . Physically Abused: Not on file  . Sexually Abused: Not on file    Vital Signs: Blood pressure 122/74, pulse 97, temperature (!) 97.5 F (36.4 C), resp. rate 16, height 5\' 6"  (1.676 m), weight 174 lb (78.9 kg), SpO2 95 %.  Examination: General Appearance: The patient is well-developed, well-nourished, and in no distress. Skin: Gross inspection of skin unremarkable. Head: normocephalic, no gross deformities. Eyes: no gross deformities noted. ENT: ears appear grossly normal no exudates. Neck: Supple. No thyromegaly. No LAD. Respiratory: clear bilaterally. Cardiovascular: Normal S1 and S2 without murmur or rub. Extremities: No cyanosis. pulses are equal. Neurologic: Alert and oriented. No involuntary  movements.  LABS: Recent Results (from the past 2160 hour(s))  Comprehensive metabolic panel     Status: Abnormal   Collection Time: 08/05/19  8:12 AM  Result Value Ref Range   Glucose 165 (H) 65 - 99 mg/dL   BUN 21 8 - 27 mg/dL   Creatinine, Ser 1.40 (H) 0.76 - 1.27 mg/dL   GFR calc non Af Amer 48 (L) >59 mL/min/1.73   GFR calc Af Amer 55 (L) >59 mL/min/1.73   BUN/Creatinine Ratio 15 10 - 24   Sodium 140 134 - 144 mmol/L   Potassium 5.3 (H) 3.5 - 5.2 mmol/L   Chloride 99 96 - 106 mmol/L   CO2 22 20 - 29 mmol/L   Calcium 9.3 8.6 - 10.2 mg/dL   Total Protein 7.5 6.0 - 8.5 g/dL   Albumin 4.6 3.7 - 4.7 g/dL   Globulin, Total 2.9 1.5 - 4.5 g/dL   Albumin/Globulin Ratio 1.6 1.2 - 2.2   Bilirubin Total 0.6 0.0 - 1.2 mg/dL   Alkaline Phosphatase 126 (H) 39 - 117 IU/L   AST 21 0 - 40 IU/L   ALT 21 0 - 44 IU/L  CBC     Status: Abnormal   Collection Time: 08/05/19  8:12 AM  Result Value Ref Range   WBC 6.3 3.4 - 10.8 x10E3/uL   RBC 5.10 4.14 - 5.80 x10E6/uL   Hemoglobin 13.5 13.0 - 17.7 g/dL   Hematocrit 41.5 37.5 - 51.0 %   MCV 81 79 - 97 fL   MCH 26.5 (L) 26.6 - 33.0 pg   MCHC 32.5 31.5 - 35.7 g/dL   RDW  12.9 11.6 - 15.4 %   Platelets 242 150 - 450 x10E3/uL  Lipid Panel w/o Chol/HDL Ratio     Status: None   Collection Time: 08/05/19  8:12 AM  Result Value Ref Range   Cholesterol, Total 104 100 - 199 mg/dL   Triglycerides 48 0 - 149 mg/dL   HDL 61 >39 mg/dL   VLDL Cholesterol Cal 12 5 - 40 mg/dL   LDL Chol Calc (NIH) 31 0 - 99 mg/dL  Iron and TIBC     Status: None   Collection Time: 08/05/19  8:12 AM  Result Value Ref Range   Total Iron Binding Capacity 301 250 - 450 ug/dL   UIBC 217 111 - 343 ug/dL   Iron 84 38 - 169 ug/dL   Iron Saturation 28 15 - 55 %  B12 and Folate Panel     Status: Abnormal   Collection Time: 08/05/19  8:12 AM  Result Value Ref Range   Vitamin B-12 1,658 (H) 232 - 1,245 pg/mL   Folate >20.0 >3.0 ng/mL    Comment: A serum folate concentration of less than 3.1 ng/mL is considered to represent clinical deficiency.   T4, free     Status: None   Collection Time: 08/05/19  8:12 AM  Result Value Ref Range   Free T4 1.33 0.82 - 1.77 ng/dL  TSH     Status: None   Collection Time: 08/05/19  8:12 AM  Result Value Ref Range   TSH 3.080 0.450 - 4.500 uIU/mL  PSA     Status: None   Collection Time: 08/05/19  8:12 AM  Result Value Ref Range   Prostate Specific Ag, Serum 2.8 0.0 - 4.0 ng/mL    Comment: Roche ECLIA methodology. According to the American Urological Association, Serum PSA should decrease and remain at undetectable levels after radical prostatectomy. The  AUA defines biochemical recurrence as an initial PSA value 0.2 ng/mL or greater followed by a subsequent confirmatory PSA value 0.2 ng/mL or greater. Values obtained with different assay methods or kits cannot be used interchangeably. Results cannot be interpreted as absolute evidence of the presence or absence of malignant disease.   Ferritin     Status: None   Collection Time: 08/05/19  8:12 AM  Result Value Ref Range   Ferritin 67 30 - 400 ng/mL  Basic metabolic panel     Status: Abnormal    Collection Time: 08/25/19  9:08 AM  Result Value Ref Range   Sodium 136 135 - 145 mmol/L   Potassium 4.8 3.5 - 5.1 mmol/L   Chloride 102 98 - 111 mmol/L   CO2 23 22 - 32 mmol/L   Glucose, Bld 218 (H) 70 - 99 mg/dL   BUN 18 8 - 23 mg/dL   Creatinine, Ser 1.17 0.61 - 1.24 mg/dL   Calcium 9.1 8.9 - 10.3 mg/dL   GFR calc non Af Amer 59 (L) >60 mL/min   GFR calc Af Amer >60 >60 mL/min   Anion gap 11 5 - 15    Comment: Performed at [facility], [address] [place], [place] [address]  CBC     Status: Abnormal   Collection Time: 08/25/19  9:08 AM  Result Value Ref Range   WBC 9.4 4.0 - 10.5 K/uL   RBC 4.75 4.22 - 5.81 MIL/uL   Hemoglobin 12.9 (L) 13.0 - 17.0 g/dL   HCT 40.5 39.0 - 52.0 %   MCV 85.3 80.0 - 100.0 fL   MCH 27.2 26.0 - 34.0 pg   MCHC 31.9 30.0 - 36.0 g/dL   RDW 13.8 11.5 - 15.5 %   Platelets 196 150 - 400 K/uL   nRBC 0.0 0.0 - 0.2 %    Comment: Performed at [facility], [address] [place], [place] [address]  Troponin I (High Sensitivity)     Status: None   Collection Time: 08/25/19  9:08 AM  Result Value Ref Range   Troponin I (High Sensitivity) 4 <18 ng/L    Comment: (NOTE) Elevated high sensitivity troponin I (hsTnI) values and significant  changes across serial measurements may suggest ACS but many other  chronic and acute conditions are known to elevate hsTnI results.  Refer to the "Links" section for chest pain algorithms and additional  guidance. Performed at [facility], [address], [place], [place] [address]     Radiology: No results found.  No results found.  No results found.    Assessment and Plan: Patient Active Problem List   Diagnosis Date Noted  . Small bowel obstruction due to adhesions ([place]) 03/12/2019  . Pleural effusion 08/06/2018  . Encounter for general adult medical examination with abnormal findings 03/20/2018  . Chronic otitis externa of both ears 03/20/2018  . Uncontrolled type 2  diabetes mellitus with hypoglycemia ([place]) 03/20/2018  . Dysuria 03/20/2018  . SOB (shortness of breath) 01/22/2018  . Cough 01/14/2018  . Constipation 12/30/2017  . Non-seasonal allergic rhinitis due to pollen 12/30/2017  . Dehydration 12/30/2017  . Essential hypertension 12/29/2017  . Uncontrolled type 2 diabetes mellitus with hyperglycemia ([place]) 12/27/2017  . Acute upper respiratory infection 12/27/2017  . Need for vaccination against Streptococcus pneumoniae using pneumococcal conjugate vaccine 13 12/27/2017  . Coronary artery disease due to lipid rich plaque 12/27/2017  . Mixed hyperlipidemia 12/27/2017  . AKI (acute kidney injury) ([place])  12/20/2017  . OSA on CPAP 02/01/2016  . Aortic ejection murmur 08/14/2015  . Tracheostomy in place [facility]) 09/22/2013  . Ileus ([place]) 09/20/2013  . Anemia 09/19/2013  . Hypercarbia 09/19/2013  . Postoperative anemia due to acute blood loss 09/06/2013  . Thrombocytopenia ([place]) 09/06/2013  . 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 Abnormal stress ECG 09/01/2013  . Asthma in adult 09/01/2013  . S/P appendectomy 09/01/2013    1. OSA on CPAP Continue to use cpap as directed.   2. Essential hypertension Controlled, continue present management.  3. Mild intermittent asthma in adult without complication Stable, continue present management.  4. Non-seasonal allergic rhinitis due to pollen Controlled, continue current therapy.  5. SOB (shortness of breath) - Spirometry with graph  General Counseling: I have discussed the findings of the evaluation and examination with Augusto.  I have also discussed any further diagnostic evaluation thatmay be needed or ordered today. Haydyn verbalizes understanding of the findings of todays visit. We also reviewed his medications today and discussed drug interactions and side effects including but not limited excessive drowsiness and altered mental states. We also discussed that there is always  a risk not just to him but also people around him. he has been encouraged to call the office with any questions or concerns that should arise related to todays visit.  Orders Placed This Encounter  Procedures  . Spirometry with graph    Order Specific Question:   Where should this test be performed?    Answer:   [facility]     Time spent: 25 This patient was seen by Orson Gear AGNP-C in Collaboration with Dr. Devona Konig as a part of collaborative care agreement.   I have personally obtained a history, examined the patient, evaluated laboratory and imaging results, formulated the assessment and plan and placed orders.    Allyne Gee, MD [facility] Pulmonary and Critical Care Sleep medicine

## 2019-09-28 DIAGNOSIS — E1165 Type 2 diabetes mellitus with hyperglycemia: Secondary | ICD-10-CM | POA: Diagnosis not present

## 2019-09-28 DIAGNOSIS — E7800 Pure hypercholesterolemia, unspecified: Secondary | ICD-10-CM | POA: Diagnosis not present

## 2019-09-28 DIAGNOSIS — I10 Essential (primary) hypertension: Secondary | ICD-10-CM | POA: Diagnosis not present

## 2019-09-28 DIAGNOSIS — G4730 Sleep apnea, unspecified: Secondary | ICD-10-CM | POA: Diagnosis not present

## 2019-09-28 DIAGNOSIS — E083499 Diabetes mellitus due to underlying condition with severe nonproliferative diabetic retinopathy without macular edema, unspecified eye: Secondary | ICD-10-CM | POA: Diagnosis not present

## 2019-09-29 DIAGNOSIS — I2510 Atherosclerotic heart disease of native coronary artery without angina pectoris: Secondary | ICD-10-CM | POA: Diagnosis not present

## 2019-09-29 DIAGNOSIS — E782 Mixed hyperlipidemia: Secondary | ICD-10-CM | POA: Diagnosis not present

## 2019-09-29 DIAGNOSIS — I25118 Atherosclerotic heart disease of native coronary artery with other forms of angina pectoris: Secondary | ICD-10-CM | POA: Diagnosis not present

## 2019-09-29 DIAGNOSIS — I10 Essential (primary) hypertension: Secondary | ICD-10-CM | POA: Diagnosis not present

## 2019-09-29 DIAGNOSIS — R55 Syncope and collapse: Secondary | ICD-10-CM | POA: Diagnosis not present

## 2019-09-29 DIAGNOSIS — G4733 Obstructive sleep apnea (adult) (pediatric): Secondary | ICD-10-CM | POA: Diagnosis not present

## 2019-10-11 ENCOUNTER — Telehealth: Payer: Self-pay

## 2019-10-11 NOTE — Telephone Encounter (Signed)
Confirmed telephone visit with patient.klh 

## 2019-10-13 ENCOUNTER — Other Ambulatory Visit: Payer: Self-pay

## 2019-10-13 ENCOUNTER — Encounter: Payer: Self-pay | Admitting: Adult Health

## 2019-10-13 ENCOUNTER — Ambulatory Visit (INDEPENDENT_AMBULATORY_CARE_PROVIDER_SITE_OTHER): Payer: Medicare Other | Admitting: Adult Health

## 2019-10-13 VITALS — BP 130/72 | HR 78 | Ht 66.0 in | Wt 170.0 lb

## 2019-10-13 DIAGNOSIS — E1165 Type 2 diabetes mellitus with hyperglycemia: Secondary | ICD-10-CM | POA: Diagnosis not present

## 2019-10-13 DIAGNOSIS — I10 Essential (primary) hypertension: Secondary | ICD-10-CM | POA: Diagnosis not present

## 2019-10-13 DIAGNOSIS — J4520 Mild intermittent asthma, uncomplicated: Secondary | ICD-10-CM

## 2019-10-13 MED ORDER — AMLODIPINE BESYLATE 2.5 MG PO TABS: ORAL_TABLET | ORAL | 0 refills | Status: DC

## 2019-10-13 NOTE — Progress Notes (Signed)
[facility] [address], [place] [address]  Internal MEDICINE  Telephone Visit  Patient Name: Daniel Hodges  [id number]  [id number]  Date of Service: 10/13/2019  I connected with the patient at 936 by telephone and verified the patients identity using two identifiers.   I discussed the limitations, risks, security and privacy concerns of performing an evaluation and management service by telephone and the availability of in person appointments. I also discussed with the patient that there may be a patient responsible charge related to the service.  The patient expressed understanding and agrees to proceed.    Chief Complaint  Patient presents with  . Telephone Assessment  . Telephone Screen  . Diabetes    BLOOD SUGAR: 129  . Hypertension    HPI  Pt seen via telephone. Pt is seen for 4 week follow up on HTN.  His bp is improved with low dose Norvasc.  He Denies Chest pain, Shortness of breath, palpitations, headache, or blurred vision. He denies any side effects of new medication.  His blood sugar continues to be stable.  He sees endocrinology. Denies any issues with his breathing, asthma appears well controlled.      Current Medication: Outpatient Encounter Medications as of 10/13/2019  Medication Sig  . amLODipine (NORVASC) 2.5 MG tablet TAKE 1 TABLET(2.5 MG) BY MOUTH DAILY  . aspirin 81 MG tablet Take 81 mg by mouth daily.  Marland Kitchen atorvastatin (LIPITOR) 20 MG tablet Take 1 tablet (20 mg total) by mouth at bedtime.  . budesonide-formoterol (SYMBICORT) 160-4.5 MCG/ACT inhaler Inhale 2 puffs into the lungs 2 (two) times daily.   . cholecalciferol (VITAMIN D3) 25 MCG (1000 UT) tablet Take 1,000 Units by mouth daily.  . ferrous sulfate 325 (65 FE) MG tablet Take 1 tablet (325 mg total) by mouth 2 (two) times daily with a meal.  . furosemide (LASIX) 40 MG tablet Take 0.5 tablets (20 mg total) by mouth daily.  Marland Kitchen gabapentin (NEURONTIN) 300 MG capsule TAKE 1 CAPSULE  BY MOUTH DAILY AND 1 CAPSULE NIGHTLY  . HUMALOG KWIKPEN 100 UNIT/ML KwikPen Inject 6-10 Units into the skin See admin instructions. Inject 6u under the skin daily at breakfast-time, 8u at lunch-time and inject 10u under the skin daily at dinner-time  . ipratropium-albuterol (DUONEB) 0.5-2.5 (3) MG/3ML SOLN USE 3 ML VIA NEBULIZER EVERY 6 HOURS AS NEEDED  . loratadine (CLARITIN) 10 MG tablet Take 1 tablet (10 mg total) by mouth daily.  . Magnesium 250 MG TABS Take 250 mg by mouth 2 (two) times daily.  . metFORMIN (GLUMETZA) 1000 MG (MOD) 24 hr tablet Take 1 tablet (1,000 mg total) by mouth daily with breakfast.  . mometasone (NASONEX) 50 MCG/ACT nasal spray Place 2 sprays into the nose daily.  . montelukast (SINGULAIR) 10 MG tablet Take 1 tablet (10 mg total) by mouth daily.  . Multiple Vitamin (MULTIVITAMIN WITH MINERALS) TABS tablet Take 1 tablet by mouth daily.  . niacin 500 MG CR capsule Take 1 capsule (500 mg total) by mouth at bedtime. (Patient taking differently: Take 500 mg by mouth daily. )  . ONE TOUCH ULTRA TEST test strip USE THREE TIMES DAILY  . senna (SENOKOT) 8.6 MG tablet Take 1 tablet (8.6 mg total) by mouth daily.  . simethicone (MYLICON) 80 MG chewable tablet Chew 2 tablets (160 mg total) by mouth 2 (two) times daily.  [name] SOLOSTAR 300 UNIT/ML SOPN Inject 25 Units into the skin at bedtime.   . vitamin  B-12 (CYANOCOBALAMIN) 1000 MCG tablet Take 1,000 mcg by mouth daily.  . [DISCONTINUED] amLODipine (NORVASC) 2.5 MG tablet TAKE 1 TABLET(2.5 MG) BY MOUTH DAILY  . [DISCONTINUED] amLODipine (NORVASC) 2.5 MG tablet Take 1 tablet (2.5 mg total) by mouth daily.   No facility-administered encounter medications on file as of 10/13/2019.    Surgical History: Past Surgical History:  Procedure Laterality Date  . APPENDECTOMY    . COLONOSCOPY WITH PROPOFOL N/A 06/11/2015   Procedure: COLONOSCOPY WITH PROPOFOL;  Surgeon: Manya Silvas, MD;  Location: [facility] ENDOSCOPY;  Service:  Endoscopy;  Laterality: N/A;  . CORONARY ARTERY BYPASS GRAFT    . HERNIA REPAIR    . TEE WITHOUT CARDIOVERSION    . TRACHEOSTOMY    . VASCULAR SURGERY      Medical History: Past Medical History:  Diagnosis Date  . Anginal pain ([place])   . Asthma   . Coronary artery disease   . Diabetes mellitus without complication ([place])   . Hyperlipidemia   . Hypertension   . Sleep apnea     Family History: Family History  Problem Relation Age of Onset  . Cancer Sister   . Diabetes Daughter   . Diabetes Son     Social History   Socioeconomic History  . Marital status: Married    Spouse name: Not on file  . Number of children: Not on file  . Years of education: Not on file  . Highest education level: Not on file  Occupational History  . Not on file  Tobacco Use  . Smoking status: Never Smoker  . Smokeless tobacco: Never Used  Substance and Sexual Activity  . Alcohol use: No  . Drug use: No  . Sexual activity: Not on file  Other Topics Concern  . Not on file  Social History Narrative  . Not on file   Social Determinants of Health   Financial Resource Strain:   . Difficulty of Paying Living Expenses: Not on file  Food Insecurity:   . Worried About Charity fundraiser in the Last Year: Not on file  . Ran Out of Food in the Last Year: Not on file  Transportation Needs:   . Lack of Transportation (Medical): Not on file  . Lack of Transportation (Non-Medical): Not on file  Physical Activity:   . Days of Exercise per Week: Not on file  . Minutes of Exercise per Session: Not on file  Stress:   . Feeling of Stress : Not on file  Social Connections:   . Frequency of Communication with Friends and Family: Not on file  . Frequency of Social Gatherings with Friends and Family: Not on file  . Attends Religious Services: Not on file  . Active Member of Clubs or Organizations: Not on file  . Attends Archivist Meetings: Not on file  . Marital Status: Not on file   Intimate Partner Violence:   . Fear of Current or Ex-Partner: Not on file  . Emotionally Abused: Not on file  . Physically Abused: Not on file  . Sexually Abused: Not on file      Review of Systems  Constitutional: Negative.  Negative for chills, fatigue and unexpected weight change.  HENT: Negative.  Negative for congestion, rhinorrhea, sneezing and sore throat.   Eyes: Negative for redness.  Respiratory: Negative.  Negative for cough, chest tightness and shortness of breath.   Cardiovascular: Negative.  Negative for chest pain and palpitations.  Gastrointestinal: Negative.  Negative for  abdominal pain, constipation, diarrhea, nausea and vomiting.  Endocrine: Negative.   Genitourinary: Negative.  Negative for dysuria and frequency.  Musculoskeletal: Negative.  Negative for arthralgias, back pain, joint swelling and neck pain.  Skin: Negative.  Negative for rash.  Allergic/Immunologic: Negative.   Neurological: Negative.  Negative for tremors and numbness.  Hematological: Negative for adenopathy. Does not bruise/bleed easily.  Psychiatric/Behavioral: Negative.  Negative for behavioral problems, sleep disturbance and suicidal ideas. The patient is not nervous/anxious.     Vital Signs: BP 130/72   Pulse 78   Ht 5\' 6"  (1.676 m)   Wt 170 lb (77.1 kg)   BMI 27.44 kg/m    Observation/Objective: Well sounding, NAD at this time.     Assessment/Plan: 1. Essential hypertension Stable, continue to use norvas as ordered.  - amLODipine (NORVASC) 2.5 MG tablet; TAKE 1 TABLET(2.5 MG) BY MOUTH DAILY  Dispense: 90 tablet; Refill: 0  2. Uncontrolled type 2 diabetes mellitus with hyperglycemia (HCC) Stable, continue to follow endocrinology plan of care.   3. 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   General Counseling: Aston verbalizes understanding of the findings of today's phone visit and agrees with plan of treatment. I  have discussed any further diagnostic evaluation that may be needed or ordered today. We also reviewed his medications today. he has been encouraged to call the office with any questions or concerns that should arise related to todays visit.    No orders of the defined types were placed in this encounter.   Meds ordered this encounter  Medications  . amLODipine (NORVASC) 2.5 MG tablet    Sig: TAKE 1 TABLET(2.5 MG) BY MOUTH DAILY    Dispense:  90 tablet    Refill:  0    **Patient requests 90 days supply**    Time spent: 20 Minutes    Orson Gear AGNP-C Internal medicine

## 2019-10-21 DIAGNOSIS — Z23 Encounter for immunization: Secondary | ICD-10-CM | POA: Diagnosis not present

## 2019-11-04 ENCOUNTER — Ambulatory Visit: Payer: Medicare Other | Admitting: Nurse Practitioner

## 2019-11-08 ENCOUNTER — Other Ambulatory Visit: Payer: Self-pay

## 2019-11-08 MED ORDER — ATORVASTATIN CALCIUM 20 MG PO TABS: 20.0000 mg | ORAL_TABLET | Freq: Every day | ORAL | 1 refills | Status: DC

## 2019-11-18 DIAGNOSIS — Z23 Encounter for immunization: Secondary | ICD-10-CM | POA: Diagnosis not present

## 2019-12-06 ENCOUNTER — Telehealth: Payer: Self-pay

## 2019-12-06 NOTE — Telephone Encounter (Signed)
Called lmom informing patient of appointment on 12/08/2019. klh

## 2019-12-07 ENCOUNTER — Telehealth: Payer: Self-pay

## 2019-12-07 NOTE — Telephone Encounter (Signed)
CONFIRMED AND SCREENED FOR 12-08-19 OV.

## 2019-12-08 ENCOUNTER — Ambulatory Visit (INDEPENDENT_AMBULATORY_CARE_PROVIDER_SITE_OTHER): Payer: Medicare Other | Admitting: Adult Health

## 2019-12-08 ENCOUNTER — Other Ambulatory Visit: Payer: Self-pay

## 2019-12-08 ENCOUNTER — Encounter: Payer: Self-pay | Admitting: Adult Health

## 2019-12-08 VITALS — BP 130/80 | HR 77 | Temp 97.4°F | Resp 16 | Ht 66.0 in

## 2019-12-08 DIAGNOSIS — J301 Allergic rhinitis due to pollen: Secondary | ICD-10-CM | POA: Diagnosis not present

## 2019-12-08 DIAGNOSIS — J4520 Mild intermittent asthma, uncomplicated: Secondary | ICD-10-CM

## 2019-12-08 DIAGNOSIS — I10 Essential (primary) hypertension: Secondary | ICD-10-CM | POA: Diagnosis not present

## 2019-12-08 DIAGNOSIS — E1165 Type 2 diabetes mellitus with hyperglycemia: Secondary | ICD-10-CM | POA: Diagnosis not present

## 2019-12-08 DIAGNOSIS — Z283 Underimmunization status: Secondary | ICD-10-CM

## 2019-12-08 DIAGNOSIS — G4733 Obstructive sleep apnea (adult) (pediatric): Secondary | ICD-10-CM

## 2019-12-08 DIAGNOSIS — Z9989 Dependence on other enabling machines and devices: Secondary | ICD-10-CM

## 2019-12-08 DIAGNOSIS — Z2839 Other underimmunization status: Secondary | ICD-10-CM

## 2019-12-08 LAB — POCT GLYCOSYLATED HEMOGLOBIN (HGB A1C): Hemoglobin A1C: 7.2 % — AB (ref 4.0–5.6)

## 2019-12-08 NOTE — Progress Notes (Signed)
[facility] [place], [place] [address]  Internal MEDICINE  Office Visit Note  Patient Name: Daniel Hodges  [id number]  [id number]  Date of Service: 12/12/2019  Chief Complaint  Patient presents with  . Follow-up    may need new blood pressure medicine  . Hypertension  . Hyperlipidemia  . Diabetes    HPI  Pt is seen for follow up.  Pt is here for follow up on HTN, HLD and DM.  Overall he is at baseline. Complaining today about coughing over the last month. His lungs are completely clear on exam.  He is on allergy medication daily.  He coughs more with his cpap on at night. He is due for a new cpap machine next month, and should have a titration study done to evaluate optimal pressure, or consider aut-otitrating machine.  He has been using his nebulizer daily, with good results.  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    Current Medication: Outpatient Encounter Medications as of 12/08/2019  Medication Sig  . aspirin 81 MG tablet Take 81 mg by mouth daily.  Marland Kitchen atorvastatin (LIPITOR) 20 MG tablet Take 1 tablet (20 mg total) by mouth at bedtime.  . budesonide-formoterol (SYMBICORT) 160-4.5 MCG/ACT inhaler Inhale 2 puffs into the lungs 2 (two) times daily.   . cholecalciferol (VITAMIN D3) 25 MCG (1000 UT) tablet Take 1,000 Units by mouth daily.  . ferrous sulfate 325 (65 FE) MG tablet Take 1 tablet (325 mg total) by mouth 2 (two) times daily with a meal.  . furosemide (LASIX) 40 MG tablet Take 0.5 tablets (20 mg total) by mouth daily.  Marland Kitchen gabapentin (NEURONTIN) 300 MG capsule TAKE 1 CAPSULE BY MOUTH DAILY AND 1 CAPSULE NIGHTLY  . HUMALOG KWIKPEN 100 UNIT/ML KwikPen Inject 6-10 Units into the skin See admin instructions. Inject 6u under the skin daily at breakfast-time, 8u at lunch-time and inject 10u under the skin daily at dinner-time  . ipratropium-albuterol (DUONEB) 0.5-2.5 (3) MG/3ML SOLN USE 3 ML VIA NEBULIZER EVERY 6 HOURS AS  NEEDED  . loratadine (CLARITIN) 10 MG tablet Take 1 tablet (10 mg total) by mouth daily.  . Magnesium 250 MG TABS Take 250 mg by mouth 2 (two) times daily.  . metFORMIN (GLUMETZA) 1000 MG (MOD) 24 hr tablet Take 1 tablet (1,000 mg total) by mouth daily with breakfast.  . mometasone (NASONEX) 50 MCG/ACT nasal spray Place 2 sprays into the nose daily.  . montelukast (SINGULAIR) 10 MG tablet Take 1 tablet (10 mg total) by mouth daily.  . Multiple Vitamin (MULTIVITAMIN WITH MINERALS) TABS tablet Take 1 tablet by mouth daily.  . niacin 500 MG CR capsule Take 1 capsule (500 mg total) by mouth at bedtime. (Patient taking differently: Take 500 mg by mouth daily. )  . ONE TOUCH ULTRA TEST test strip USE THREE TIMES DAILY  . senna (SENOKOT) 8.6 MG tablet Take 1 tablet (8.6 mg total) by mouth daily.  . simethicone (MYLICON) 80 MG chewable tablet Chew 2 tablets (160 mg total) by mouth 2 (two) times daily.  [name] SOLOSTAR 300 UNIT/ML SOPN Inject 25 Units into the skin at bedtime.   . vitamin B-12 (CYANOCOBALAMIN) 1000 MCG tablet Take 1,000 mcg by mouth daily.  . [DISCONTINUED] amLODipine (NORVASC) 2.5 MG tablet TAKE 1 TABLET(2.5 MG) BY MOUTH DAILY   No facility-administered encounter medications on file as of 12/08/2019.    Surgical History: Past Surgical History:  Procedure Laterality Date  .  APPENDECTOMY    . COLONOSCOPY WITH PROPOFOL N/A 06/11/2015   Procedure: COLONOSCOPY WITH PROPOFOL;  Surgeon: Manya Silvas, MD;  Location: [facility] ENDOSCOPY;  Service: Endoscopy;  Laterality: N/A;  . CORONARY ARTERY BYPASS GRAFT    . HERNIA REPAIR    . TEE WITHOUT CARDIOVERSION    . TRACHEOSTOMY    . VASCULAR SURGERY      Medical History: Past Medical History:  Diagnosis Date  . Anginal pain ([place])   . Asthma   . Coronary artery disease   . Diabetes mellitus without complication ([place])   . Hyperlipidemia   . Hypertension   . Sleep apnea     Family History: Family History  Problem Relation Age of  Onset  . Cancer Sister   . Diabetes Daughter   . Diabetes Son     Social History   Socioeconomic History  . Marital status: Married    Spouse name: Not on file  . Number of children: Not on file  . Years of education: Not on file  . Highest education level: Not on file  Occupational History  . Not on file  Tobacco Use  . Smoking status: Never Smoker  . Smokeless tobacco: Never Used  Substance and Sexual Activity  . Alcohol use: No  . Drug use: No  . Sexual activity: Not on file  Other Topics Concern  . Not on file  Social History Narrative  . Not on file   Social Determinants of Health   Financial Resource Strain:   . Difficulty of Paying Living Expenses:   Food Insecurity:   . Worried About Charity fundraiser in the Last Year:   . Arboriculturist in the Last Year:   Transportation Needs:   . Film/video editor (Medical):   Marland Kitchen Lack of Transportation (Non-Medical):   Physical Activity:   . Days of Exercise per Week:   . Minutes of Exercise per Session:   Stress:   . Feeling of Stress :   Social Connections:   . Frequency of Communication with Friends and Family:   . Frequency of Social Gatherings with Friends and Family:   . Attends Religious Services:   . Active Member of Clubs or Organizations:   . Attends Archivist Meetings:   Marland Kitchen Marital Status:   Intimate Partner Violence:   . Fear of Current or Ex-Partner:   . Emotionally Abused:   Marland Kitchen Physically Abused:   . Sexually Abused:       Review of Systems  Constitutional: Negative.  Negative for chills, fatigue and unexpected weight change.  HENT: Negative.  Negative for congestion, rhinorrhea, sneezing and sore throat.   Eyes: Negative for redness.  Respiratory: Negative.  Negative for cough, chest tightness and shortness of breath.   Cardiovascular: Negative.  Negative for chest pain and palpitations.  Gastrointestinal: Negative.  Negative for abdominal pain, constipation, diarrhea, nausea  and vomiting.  Endocrine: Negative.   Genitourinary: Negative.  Negative for dysuria and frequency.  Musculoskeletal: Negative.  Negative for arthralgias, back pain, joint swelling and neck pain.  Skin: Negative.  Negative for rash.  Allergic/Immunologic: Negative.   Neurological: Negative.  Negative for tremors and numbness.  Hematological: Negative for adenopathy. Does not bruise/bleed easily.  Psychiatric/Behavioral: Negative.  Negative for behavioral problems, sleep disturbance and suicidal ideas. The patient is not nervous/anxious.     Vital Signs: BP 130/80   Pulse 77   Temp (!) 97.4 F (36.3 C)   Resp  16   Ht 5\' 6"  (1.676 m)   SpO2 98%   BMI 27.44 kg/m    Physical Exam Vitals and nursing note reviewed.  Constitutional:      General: He is not in acute distress.    Appearance: He is well-developed. He is not diaphoretic.  HENT:     Head: Normocephalic and atraumatic.     Mouth/Throat:     Pharynx: No oropharyngeal exudate.  Eyes:     Pupils: Pupils are equal, round, and reactive to light.  Neck:     Thyroid: No thyromegaly.     Vascular: No JVD.     Trachea: No tracheal deviation.  Cardiovascular:     Rate and Rhythm: Normal rate and regular rhythm.     Heart sounds: Normal heart sounds. No murmur. No friction rub. No gallop.   Pulmonary:     Effort: Pulmonary effort is normal. No respiratory distress.     Breath sounds: Normal breath sounds. No wheezing or rales.  Chest:     Chest wall: No tenderness.  Abdominal:     Palpations: Abdomen is soft.     Tenderness: There is no abdominal tenderness. There is no guarding.  Musculoskeletal:        General: Normal range of motion.     Cervical back: Normal range of motion and neck supple.  Lymphadenopathy:     Cervical: No cervical adenopathy.  Skin:    General: Skin is warm and dry.  Neurological:     Mental Status: He is alert and oriented to person, place, and time.     Cranial Nerves: No cranial nerve  deficit.  Psychiatric:        Behavior: Behavior normal.        Thought Content: Thought content normal.        Judgment: Judgment normal.    Assessment/Plan: 1. Essential hypertension Stable, BP elevated initially. Recheck was improved.  This is normal for him.     2. Uncontrolled type 2 diabetes mellitus with hyperglycemia [facility]) Patient sees endocrinology.  Requested A1C today, currently 7.2. Will continue medications per current orders.  - POCT HgB A1C  3. Mild intermittent asthma in adult without complication Stable on current medications.  Continue to use nebulizer  4. OSA on CPAP Encouraged continued compliance with cpap at this time.  Will get new machine when eligible.    5. Non-seasonal allergic rhinitis due to pollen Continue to use medications as directed.   6. Immunization deficiency - Pneumococcal polysaccharide vaccine 23-valent greater than or equal to 2yo subcutaneous/IM; Future - Varicella-zoster vaccine subcutaneous  General Counseling: Hillman verbalizes understanding of the findings of todays visit and agrees with plan of treatment. I have discussed any further diagnostic evaluation that may be needed or ordered today. We also reviewed his medications today. he has been encouraged to call the office with any questions or concerns that should arise related to todays visit.    Orders Placed This Encounter  Procedures  . Pneumococcal polysaccharide vaccine 23-valent greater than or equal to 2yo subcutaneous/IM  . Varicella-zoster vaccine subcutaneous  . POCT HgB A1C    No orders of the defined types were placed in this encounter.   Time spent: 25 Minutes   This patient was seen by Orson Gear AGNP-C in Collaboration with Dr Lavera Guise as a part of collaborative care agreement     Kendell Bane AGNP-C Internal medicine

## 2019-12-12 ENCOUNTER — Other Ambulatory Visit: Payer: Self-pay

## 2019-12-12 DIAGNOSIS — I10 Essential (primary) hypertension: Secondary | ICD-10-CM

## 2019-12-12 MED ORDER — AMLODIPINE BESYLATE 2.5 MG PO TABS: ORAL_TABLET | ORAL | 0 refills | Status: DC

## 2019-12-15 ENCOUNTER — Ambulatory Visit
Admission: RE | Admit: 2019-12-15 | Discharge: 2019-12-15 | Disposition: A | Discharge status: Home / Self Care | Payer: Medicare Other | Source: Ambulatory Visit | Attending: Adult Health | Admitting: Adult Health

## 2019-12-15 ENCOUNTER — Ambulatory Visit (INDEPENDENT_AMBULATORY_CARE_PROVIDER_SITE_OTHER): Payer: Medicare Other | Admitting: Adult Health

## 2019-12-15 ENCOUNTER — Other Ambulatory Visit: Payer: Self-pay

## 2019-12-15 ENCOUNTER — Encounter: Payer: Self-pay | Admitting: Adult Health

## 2019-12-15 ENCOUNTER — Ambulatory Visit
Admission: RE | Admit: 2019-12-15 | Discharge: 2019-12-15 | Disposition: A | Discharge status: Home / Self Care | Payer: Medicare Other | Attending: Adult Health | Admitting: Adult Health

## 2019-12-15 VITALS — BP 140/63 | HR 84 | Temp 97.5°F | Resp 16 | Ht 66.0 in | Wt 177.0 lb

## 2019-12-15 DIAGNOSIS — M25561 Pain in right knee: Secondary | ICD-10-CM

## 2019-12-15 NOTE — Progress Notes (Signed)
[facility] [place], [place] [address]  Internal MEDICINE  Office Visit Note  Patient Name: Daniel Hodges  [id number]  [id number]  Date of Service: 12/18/2019  Chief Complaint  Patient presents with  . Knee Pain    right going for 6 days      HPI Pt is here for a sick visit. Pt is here reporting one week of right knee pain. He reports he was crawling under the house, and a day later he noticed his knee was hurting.  He does report some intermittent knee pain that makes it briefly difficult to walk.  This has been going on for the last year.  He recently has some near falls due to this pain.       Current Medication:  Outpatient Encounter Medications as of 12/15/2019  Medication Sig  . amLODipine (NORVASC) 2.5 MG tablet TAKE 1 TABLET(2.5 MG) BY MOUTH DAILY  . aspirin 81 MG tablet Take 81 mg by mouth daily.  Marland Kitchen atorvastatin (LIPITOR) 20 MG tablet Take 1 tablet (20 mg total) by mouth at bedtime.  . budesonide-formoterol (SYMBICORT) 160-4.5 MCG/ACT inhaler Inhale 2 puffs into the lungs 2 (two) times daily.   . cholecalciferol (VITAMIN D3) 25 MCG (1000 UT) tablet Take 1,000 Units by mouth daily.  . ferrous sulfate 325 (65 FE) MG tablet Take 1 tablet (325 mg total) by mouth 2 (two) times daily with a meal.  . furosemide (LASIX) 40 MG tablet Take 0.5 tablets (20 mg total) by mouth daily.  Marland Kitchen gabapentin (NEURONTIN) 300 MG capsule TAKE 1 CAPSULE BY MOUTH DAILY AND 1 CAPSULE NIGHTLY  . HUMALOG KWIKPEN 100 UNIT/ML KwikPen Inject 6-10 Units into the skin See admin instructions. Inject 6u under the skin daily at breakfast-time, 8u at lunch-time and inject 10u under the skin daily at dinner-time  . ipratropium-albuterol (DUONEB) 0.5-2.5 (3) MG/3ML SOLN USE 3 ML VIA NEBULIZER EVERY 6 HOURS AS NEEDED  . loratadine (CLARITIN) 10 MG tablet Take 1 tablet (10 mg total) by mouth daily.  . Magnesium 250 MG TABS Take 250 mg by mouth 2 (two) times daily.  . metFORMIN  (GLUMETZA) 1000 MG (MOD) 24 hr tablet Take 1 tablet (1,000 mg total) by mouth daily with breakfast.  . mometasone (NASONEX) 50 MCG/ACT nasal spray Place 2 sprays into the nose daily.  . montelukast (SINGULAIR) 10 MG tablet Take 1 tablet (10 mg total) by mouth daily.  . Multiple Vitamin (MULTIVITAMIN WITH MINERALS) TABS tablet Take 1 tablet by mouth daily.  . niacin 500 MG CR capsule Take 1 capsule (500 mg total) by mouth at bedtime. (Patient taking differently: Take 500 mg by mouth daily. )  . ONE TOUCH ULTRA TEST test strip USE THREE TIMES DAILY  . senna (SENOKOT) 8.6 MG tablet Take 1 tablet (8.6 mg total) by mouth daily.  . simethicone (MYLICON) 80 MG chewable tablet Chew 2 tablets (160 mg total) by mouth 2 (two) times daily.  [name] SOLOSTAR 300 UNIT/ML SOPN Inject 25 Units into the skin at bedtime.   . vitamin B-12 (CYANOCOBALAMIN) 1000 MCG tablet Take 1,000 mcg by mouth daily.   No facility-administered encounter medications on file as of 12/15/2019.      Medical History: Past Medical History:  Diagnosis Date  . Anginal pain ([place])   . Asthma   . Coronary artery disease   . Diabetes mellitus without complication ([place])   . Hyperlipidemia   . Hypertension   . Sleep apnea  Vital Signs: BP 140/63   Pulse 84   Temp (!) 97.5 F (36.4 C)   Resp 16   Ht 5\' 6"  (1.676 m)   Wt 177 lb (80.3 kg)   SpO2 96%   BMI 28.57 kg/m    Review of Systems  Constitutional: Negative.  Negative for chills, fatigue and unexpected weight change.  HENT: Negative.  Negative for congestion, rhinorrhea, sneezing and sore throat.   Eyes: Negative for redness.  Respiratory: Negative.  Negative for cough, chest tightness and shortness of breath.   Cardiovascular: Negative.  Negative for chest pain and palpitations.  Gastrointestinal: Negative.  Negative for abdominal pain, constipation, diarrhea, nausea and vomiting.  Endocrine: Negative.   Genitourinary: Negative.  Negative for dysuria and  frequency.  Musculoskeletal: Negative.  Negative for arthralgias, back pain, joint swelling and neck pain.       Right knee pain  Skin: Negative.  Negative for rash.  Allergic/Immunologic: Negative.   Neurological: Negative.  Negative for tremors and numbness.  Hematological: Negative for adenopathy. Does not bruise/bleed easily.  Psychiatric/Behavioral: Negative.  Negative for behavioral problems, sleep disturbance and suicidal ideas. The patient is not nervous/anxious.     Physical Exam Vitals and nursing note reviewed.  Constitutional:      General: He is not in acute distress.    Appearance: He is well-developed. He is not diaphoretic.  HENT:     Head: Normocephalic and atraumatic.     Mouth/Throat:     Pharynx: No oropharyngeal exudate.  Eyes:     Pupils: Pupils are equal, round, and reactive to light.  Neck:     Thyroid: No thyromegaly.     Vascular: No JVD.     Trachea: No tracheal deviation.  Cardiovascular:     Rate and Rhythm: Normal rate and regular rhythm.     Heart sounds: Normal heart sounds. No murmur. No friction rub. No gallop.   Pulmonary:     Effort: Pulmonary effort is normal. No respiratory distress.     Breath sounds: Normal breath sounds. No wheezing or rales.  Chest:     Chest wall: No tenderness.  Abdominal:     Palpations: Abdomen is soft.     Tenderness: There is no abdominal tenderness. There is no guarding.  Musculoskeletal:        General: Normal range of motion.     Cervical back: Normal range of motion and neck supple.     Comments: Right knee pain.  Tender to palpation. Stable with lateral movement.  Lymphadenopathy:     Cervical: No cervical adenopathy.  Skin:    General: Skin is warm and dry.  Neurological:     Mental Status: He is alert and oriented to person, place, and time.     Cranial Nerves: No cranial nerve deficit.  Psychiatric:        Behavior: Behavior normal.        Thought Content: Thought content normal.         Judgment: Judgment normal.    Assessment/Plan: 1. Acute pain of right knee Use topical applications, as well as motrin per bottle instructions for pain.  Also may use heating pad.  Rest and elevate as tolerated.   - DG Knee Complete 4 Views Right; Future  General Counseling: George verbalizes understanding of the findings of todays visit and agrees with plan of treatment. I have discussed any further diagnostic evaluation that may be needed or ordered today. We also reviewed his medications today. he  has been encouraged to call the office with any questions or concerns that should arise related to todays visit.   Orders Placed This Encounter  Procedures  . DG Knee Complete 4 Views Right    No orders of the defined types were placed in this encounter.   Time spent: 25 Minutes  This patient was seen by Orson Gear AGNP-C in Collaboration with Dr Lavera Guise as a part of collaborative care agreement.  Kendell Bane AGNP-C Internal Medicine

## 2019-12-19 ENCOUNTER — Telehealth: Payer: Self-pay

## 2019-12-19 NOTE — Telephone Encounter (Signed)
Pt wife advised showed osteoarthritis and need to see orthopedic due to he having bad pain

## 2019-12-20 DIAGNOSIS — M1711 Unilateral primary osteoarthritis, right knee: Secondary | ICD-10-CM | POA: Diagnosis not present

## 2019-12-28 ENCOUNTER — Telehealth: Payer: Self-pay | Admitting: Internal Medicine

## 2019-12-28 ENCOUNTER — Other Ambulatory Visit: Payer: Self-pay

## 2019-12-28 DIAGNOSIS — M1711 Unilateral primary osteoarthritis, right knee: Secondary | ICD-10-CM | POA: Diagnosis not present

## 2019-12-28 DIAGNOSIS — M1712 Unilateral primary osteoarthritis, left knee: Secondary | ICD-10-CM | POA: Diagnosis not present

## 2019-12-28 MED ORDER — GABAPENTIN 300 MG PO CAPS: ORAL_CAPSULE | ORAL | 0 refills | Status: DC

## 2019-12-28 NOTE — Telephone Encounter (Signed)
Left a message explaining his next appointment we will discuss ordering a new cpap machine, he will be due and will need RX for this at next visit. beth

## 2020-01-02 DIAGNOSIS — M1711 Unilateral primary osteoarthritis, right knee: Secondary | ICD-10-CM | POA: Diagnosis not present

## 2020-01-06 ENCOUNTER — Telehealth: Payer: Self-pay

## 2020-01-06 NOTE — Telephone Encounter (Signed)
Confirmed and screened for 01-10-20 ov. Advised to bring all prescription bottles.

## 2020-01-10 ENCOUNTER — Ambulatory Visit (INDEPENDENT_AMBULATORY_CARE_PROVIDER_SITE_OTHER): Payer: Medicare Other | Admitting: Internal Medicine

## 2020-01-10 ENCOUNTER — Other Ambulatory Visit: Payer: Self-pay

## 2020-01-10 ENCOUNTER — Encounter: Payer: Self-pay | Admitting: Internal Medicine

## 2020-01-10 VITALS — BP 130/82 | HR 76 | Temp 97.4°F | Resp 16 | Ht 66.0 in | Wt 177.0 lb

## 2020-01-10 DIAGNOSIS — I739 Peripheral vascular disease, unspecified: Secondary | ICD-10-CM

## 2020-01-10 DIAGNOSIS — E11649 Type 2 diabetes mellitus with hypoglycemia without coma: Secondary | ICD-10-CM

## 2020-01-10 NOTE — Progress Notes (Signed)
[facility] [place], [place] [address]  Internal MEDICINE  Office Visit Note  Patient Name: Daniel Hodges  [id number]  [id number]  Date of Service: 01/15/2020  Chief Complaint  Patient presents with  . Diabetes  . Hyperlipidemia  . Hypertension  . Follow-up    referral for vascular specialist  . Knee Pain   HPI Pt is here for follow up. 1. Worsening pain and redness in legs. He is being seen by ortho and was told to get vascular evaluation since the pain is not getting better. Pt is on Lasix as well. 2. C/O having low blood sugars at night, takes multiple injections of insulin and oral hypoglycemics as well. 3. Knee pain right more than left, followed by ortho  Current Medication: Outpatient Encounter Medications as of 01/10/2020  Medication Sig  . amLODipine (NORVASC) 2.5 MG tablet TAKE 1 TABLET(2.5 MG) BY MOUTH DAILY  . aspirin 81 MG tablet Take 81 mg by mouth daily.  Marland Kitchen atorvastatin (LIPITOR) 20 MG tablet Take 1 tablet (20 mg total) by mouth at bedtime.  . budesonide-formoterol (SYMBICORT) 160-4.5 MCG/ACT inhaler Inhale 2 puffs into the lungs 2 (two) times daily.   . cholecalciferol (VITAMIN D3) 25 MCG (1000 UT) tablet Take 1,000 Units by mouth daily.  . ferrous sulfate 325 (65 FE) MG tablet Take 1 tablet (325 mg total) by mouth 2 (two) times daily with a meal.  . furosemide (LASIX) 40 MG tablet Take 0.5 tablets (20 mg total) by mouth daily.  Marland Kitchen gabapentin (NEURONTIN) 300 MG capsule TAKE 1 CAPSULE BY MOUTH DAILY AND 1 CAPSULE NIGHTLY  . HUMALOG KWIKPEN 100 UNIT/ML KwikPen Inject 6-10 Units into the skin See admin instructions. Inject 6u under the skin daily at breakfast-time, 8u at lunch-time and inject 10u under the skin daily at dinner-time  . ipratropium-albuterol (DUONEB) 0.5-2.5 (3) MG/3ML SOLN USE 3 ML VIA NEBULIZER EVERY 6 HOURS AS NEEDED  . loratadine (CLARITIN) 10 MG tablet Take 1 tablet (10 mg total) by mouth daily.  . Magnesium 250 MG  TABS Take 250 mg by mouth 2 (two) times daily.  . metFORMIN (GLUMETZA) 1000 MG (MOD) 24 hr tablet Take 1 tablet (1,000 mg total) by mouth daily with breakfast.  . mometasone (NASONEX) 50 MCG/ACT nasal spray Place 2 sprays into the nose daily.  . montelukast (SINGULAIR) 10 MG tablet Take 1 tablet (10 mg total) by mouth daily.  . Multiple Vitamin (MULTIVITAMIN WITH MINERALS) TABS tablet Take 1 tablet by mouth daily.  . niacin 500 MG CR capsule Take 1 capsule (500 mg total) by mouth at bedtime. (Patient taking differently: Take 500 mg by mouth daily. )  . ONE TOUCH ULTRA TEST test strip USE THREE TIMES DAILY  . senna (SENOKOT) 8.6 MG tablet Take 1 tablet (8.6 mg total) by mouth daily.  . simethicone (MYLICON) 80 MG chewable tablet Chew 2 tablets (160 mg total) by mouth 2 (two) times daily.  [name] SOLOSTAR 300 UNIT/ML SOPN Inject 25 Units into the skin at bedtime.   . vitamin B-12 (CYANOCOBALAMIN) 1000 MCG tablet Take 1,000 mcg by mouth daily.   No facility-administered encounter medications on file as of 01/10/2020.    Surgical History: Past Surgical History:  Procedure Laterality Date  . APPENDECTOMY    . COLONOSCOPY WITH PROPOFOL N/A 06/11/2015   Procedure: COLONOSCOPY WITH PROPOFOL;  Surgeon: Manya Silvas, MD;  Location: [facility] ENDOSCOPY;  Service: Endoscopy;  Laterality: N/A;  . CORONARY ARTERY BYPASS GRAFT    .  HERNIA REPAIR    . TEE WITHOUT CARDIOVERSION    . TRACHEOSTOMY    . VASCULAR SURGERY      Medical History: Past Medical History:  Diagnosis Date  . Anginal pain ([place])   . Asthma   . Coronary artery disease   . Diabetes mellitus without complication ([place])   . Hyperlipidemia   . Hypertension   . Sleep apnea     Family History: Family History  Problem Relation Age of Onset  . Cancer Sister   . Diabetes Daughter   . Diabetes Son     Social History   Socioeconomic History  . Marital status: Married    Spouse name: Not on file  . Number of children: Not on  file  . Years of education: Not on file  . Highest education level: Not on file  Occupational History  . Not on file  Tobacco Use  . Smoking status: Never Smoker  . Smokeless tobacco: Never Used  Substance and Sexual Activity  . Alcohol use: No  . Drug use: No  . Sexual activity: Not on file  Other Topics Concern  . Not on file  Social History Narrative  . Not on file   Social Determinants of Health   Financial Resource Strain:   . Difficulty of Paying Living Expenses:   Food Insecurity:   . Worried About Charity fundraiser in the Last Year:   . Arboriculturist in the Last Year:   Transportation Needs:   . Film/video editor (Medical):   Marland Kitchen Lack of Transportation (Non-Medical):   Physical Activity:   . Days of Exercise per Week:   . Minutes of Exercise per Session:   Stress:   . Feeling of Stress :   Social Connections:   . Frequency of Communication with Friends and Family:   . Frequency of Social Gatherings with Friends and Family:   . Attends Religious Services:   . Active Member of Clubs or Organizations:   . Attends Archivist Meetings:   Marland Kitchen Marital Status:   Intimate Partner Violence:   . Fear of Current or Ex-Partner:   . Emotionally Abused:   Marland Kitchen Physically Abused:   . Sexually Abused:    Review of Systems  Constitutional: Negative for chills, fatigue and unexpected weight change.  HENT: Negative for congestion, postnasal drip, rhinorrhea, sneezing and sore throat.   Eyes: Negative for redness.  Respiratory: Negative for cough, chest tightness and shortness of breath.   Cardiovascular: Negative for chest pain and palpitations.  Gastrointestinal: Positive for abdominal pain. Negative for constipation, diarrhea, nausea and vomiting.  Genitourinary: Negative for dysuria and frequency.  Musculoskeletal: Negative for arthralgias, back pain, joint swelling and neck pain.  Skin: Negative for rash.  Neurological: Negative.  Negative for tremors and  numbness.  Hematological: Negative for adenopathy. Does not bruise/bleed easily.  Psychiatric/Behavioral: Negative for behavioral problems (Depression), sleep disturbance and suicidal ideas. The patient is not nervous/anxious.    Vital Signs: BP 130/82   Pulse 76   Temp (!) 97.4 F (36.3 C)   Resp 16   Ht 5\' 6"  (1.676 m)   Wt 177 lb (80.3 kg)   SpO2 98%   BMI 28.57 kg/m   Physical Exam Constitutional:      Appearance: Normal appearance.  HENT:     Mouth/Throat:     Mouth: Mucous membranes are dry.  Cardiovascular:     Rate and Rhythm: Normal rate and regular rhythm.  Pulmonary:     Effort: Pulmonary effort is normal.     Breath sounds: Normal breath sounds.  Skin:    General: Skin is warm and dry.  Neurological:     General: No focal deficit present.     Mental Status: He is alert and oriented to person, place, and time.    Assessment/Plan: 1. Peripheral angiopathy ([place]) - Pt will need prelim testing before referring to vascular. - Ankle Brachial Index Assessment (BFP) - POCT ABI Screening Pilot No Charge - US ARTERIAL LOWER EXTREMITY DUPLEX BILATERAL; Future  2. 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 I have discussed any further diagnostic evaluation that may be needed or ordered today. We also reviewed his medications today. he has been encouraged to call the office with any questions or concerns that should arise related to todays visit.  Orders Placed This Encounter  Procedures  . US ARTERIAL LOWER EXTREMITY DUPLEX BILATERAL  . Ankle Brachial Index Assessment (BFP)  . POCT ABI Screening Pilot No Charge    Total time spent: 30 Minutes Time spent includes review of chart, medications, test results, and  follow up plan with the patient.   Dr Lavera Guise Internal medicine

## 2020-01-13 DIAGNOSIS — J4530 Mild persistent asthma, uncomplicated: Secondary | ICD-10-CM | POA: Diagnosis not present

## 2020-01-13 DIAGNOSIS — I10 Essential (primary) hypertension: Secondary | ICD-10-CM | POA: Diagnosis not present

## 2020-01-13 DIAGNOSIS — E113499 Type 2 diabetes mellitus with severe nonproliferative diabetic retinopathy without macular edema, unspecified eye: Secondary | ICD-10-CM | POA: Diagnosis not present

## 2020-01-13 DIAGNOSIS — E1165 Type 2 diabetes mellitus with hyperglycemia: Secondary | ICD-10-CM | POA: Diagnosis not present

## 2020-01-19 ENCOUNTER — Telehealth: Payer: Self-pay

## 2020-01-19 NOTE — Telephone Encounter (Signed)
Called lmom informing patient of appointment on 01/23/2020. klh

## 2020-01-23 ENCOUNTER — Ambulatory Visit: Payer: Medicare Other | Admitting: Internal Medicine

## 2020-01-23 ENCOUNTER — Other Ambulatory Visit: Payer: Self-pay

## 2020-01-23 ENCOUNTER — Encounter: Payer: Self-pay | Admitting: Internal Medicine

## 2020-01-23 VITALS — BP 138/78 | HR 74 | Temp 97.5°F | Resp 16 | Ht 66.0 in | Wt 172.4 lb

## 2020-01-23 DIAGNOSIS — J4520 Mild intermittent asthma, uncomplicated: Secondary | ICD-10-CM | POA: Diagnosis not present

## 2020-01-23 DIAGNOSIS — I10 Essential (primary) hypertension: Secondary | ICD-10-CM | POA: Diagnosis not present

## 2020-01-23 DIAGNOSIS — G4733 Obstructive sleep apnea (adult) (pediatric): Secondary | ICD-10-CM

## 2020-01-23 DIAGNOSIS — J301 Allergic rhinitis due to pollen: Secondary | ICD-10-CM

## 2020-01-23 DIAGNOSIS — Z9989 Dependence on other enabling machines and devices: Secondary | ICD-10-CM

## 2020-01-23 NOTE — Progress Notes (Signed)
[facility] [place], [place] [address]  Pulmonary Sleep Medicine   Office Visit Note  Patient Name: Daniel Hodges DOB: 10/14/1940 MRN [id number]  Date of Service: 01/23/2020  Complaints/HPI: PT is here for pulmonary follow up.  He reports he is doing well overall. He has an old cpap and is in need of a new machine.  Pt reports good compliance with CPAP therapy. Cleaning machine by hand, and changing filters and tubing as directed. Denies headaches, sinus issues, palpitations, or hemoptysis.  He is using his inhalers intermittently.  He uses nebulizer regularly.   ROS  General: (-) fever, (-) chills, (-) night sweats, (-) weakness Skin: (-) rashes, (-) itching,. Eyes: (-) visual changes, (-) redness, (-) itching. Nose and Sinuses: (-) nasal stuffiness or itchiness, (-) postnasal drip, (-) nosebleeds, (-) sinus trouble. Mouth and Throat: (-) sore throat, (-) hoarseness. Neck: (-) swollen glands, (-) enlarged thyroid, (-) neck pain. Respiratory: - cough, (-) bloody sputum, - shortness of breath, - wheezing. Cardiovascular: - ankle swelling, (-) chest pain. Lymphatic: (-) lymph node enlargement. Neurologic: (-) numbness, (-) tingling. Psychiatric: (-) anxiety, (-) depression   Current Medication: Outpatient Encounter Medications as of 01/23/2020  Medication Sig  . amLODipine (NORVASC) 2.5 MG tablet TAKE 1 TABLET(2.5 MG) BY MOUTH DAILY  . aspirin 81 MG tablet Take 81 mg by mouth daily.  Marland Kitchen atorvastatin (LIPITOR) 20 MG tablet Take 1 tablet (20 mg total) by mouth at bedtime.  . budesonide-formoterol (SYMBICORT) 160-4.5 MCG/ACT inhaler Inhale 2 puffs into the lungs 2 (two) times daily.   . cholecalciferol (VITAMIN D3) 25 MCG (1000 UT) tablet Take 1,000 Units by mouth daily.  . ferrous sulfate 325 (65 FE) MG tablet Take 1 tablet (325 mg total) by mouth 2 (two) times daily with a meal.  . furosemide (LASIX) 40 MG tablet Take 0.5 tablets (20 mg total) by mouth  daily.  Marland Kitchen gabapentin (NEURONTIN) 300 MG capsule TAKE 1 CAPSULE BY MOUTH DAILY AND 1 CAPSULE NIGHTLY  . HUMALOG KWIKPEN 100 UNIT/ML KwikPen Inject 6-10 Units into the skin See admin instructions. Inject 6u under the skin daily at breakfast-time, 8u at lunch-time and inject 10u under the skin daily at dinner-time  . ipratropium-albuterol (DUONEB) 0.5-2.5 (3) MG/3ML SOLN USE 3 ML VIA NEBULIZER EVERY 6 HOURS AS NEEDED  . loratadine (CLARITIN) 10 MG tablet Take 1 tablet (10 mg total) by mouth daily.  . Magnesium 250 MG TABS Take 250 mg by mouth 2 (two) times daily.  . metFORMIN (GLUMETZA) 1000 MG (MOD) 24 hr tablet Take 1 tablet (1,000 mg total) by mouth daily with breakfast.  . mometasone (NASONEX) 50 MCG/ACT nasal spray Place 2 sprays into the nose daily.  . montelukast (SINGULAIR) 10 MG tablet Take 1 tablet (10 mg total) by mouth daily.  . Multiple Vitamin (MULTIVITAMIN WITH MINERALS) TABS tablet Take 1 tablet by mouth daily.  . niacin 500 MG CR capsule Take 1 capsule (500 mg total) by mouth at bedtime. (Patient taking differently: Take 500 mg by mouth daily. )  . ONE TOUCH ULTRA TEST test strip USE THREE TIMES DAILY  . senna (SENOKOT) 8.6 MG tablet Take 1 tablet (8.6 mg total) by mouth daily.  . simethicone (MYLICON) 80 MG chewable tablet Chew 2 tablets (160 mg total) by mouth 2 (two) times daily.  [name] SOLOSTAR 300 UNIT/ML SOPN Inject 25 Units into the skin at bedtime.   . vitamin B-12 (CYANOCOBALAMIN) 1000 MCG tablet Take 1,000 mcg by  mouth daily.   No facility-administered encounter medications on file as of 01/23/2020.    Surgical History: Past Surgical History:  Procedure Laterality Date  . APPENDECTOMY    . COLONOSCOPY WITH PROPOFOL N/A 06/11/2015   Procedure: COLONOSCOPY WITH PROPOFOL;  Surgeon: Manya Silvas, MD;  Location: [facility] ENDOSCOPY;  Service: Endoscopy;  Laterality: N/A;  . CORONARY ARTERY BYPASS GRAFT    . HERNIA REPAIR    . TEE WITHOUT CARDIOVERSION    . TRACHEOSTOMY     . VASCULAR SURGERY      Medical History: Past Medical History:  Diagnosis Date  . Anginal pain ([place])   . Asthma   . Coronary artery disease   . Diabetes mellitus without complication ([place])   . Hyperlipidemia   . Hypertension   . Sleep apnea     Family History: Family History  Problem Relation Age of Onset  . Cancer Sister   . Diabetes Daughter   . Diabetes Son     Social History: Social History   Socioeconomic History  . Marital status: Married    Spouse name: Not on file  . Number of children: Not on file  . Years of education: Not on file  . Highest education level: Not on file  Occupational History  . Not on file  Tobacco Use  . Smoking status: Never Smoker  . Smokeless tobacco: Never Used  Substance and Sexual Activity  . Alcohol use: No  . Drug use: No  . Sexual activity: Not on file  Other Topics Concern  . Not on file  Social History Narrative  . Not on file   Social Determinants of Health   Financial Resource Strain:   . Difficulty of Paying Living Expenses:   Food Insecurity:   . Worried About Charity fundraiser in the Last Year:   . Arboriculturist in the Last Year:   Transportation Needs:   . Film/video editor (Medical):   Marland Kitchen Lack of Transportation (Non-Medical):   Physical Activity:   . Days of Exercise per Week:   . Minutes of Exercise per Session:   Stress:   . Feeling of Stress :   Social Connections:   . Frequency of Communication with Friends and Family:   . Frequency of Social Gatherings with Friends and Family:   . Attends Religious Services:   . Active Member of Clubs or Organizations:   . Attends Archivist Meetings:   Marland Kitchen Marital Status:   Intimate Partner Violence:   . Fear of Current or Ex-Partner:   . Emotionally Abused:   Marland Kitchen Physically Abused:   . Sexually Abused:     Vital Signs: Blood pressure (!) 153/77, pulse 74, temperature (!) 97.5 F (36.4 C), resp. rate 16, height 5\' 6"  (1.676 m), weight 172  lb 6.4 oz (78.2 kg), SpO2 98 %.  Examination: General Appearance: The patient is well-developed, well-nourished, and in no distress. Skin: Gross inspection of skin unremarkable. Head: normocephalic, no gross deformities. Eyes: no gross deformities noted. ENT: ears appear grossly normal no exudates. Neck: Supple. No thyromegaly. No LAD. Respiratory: clear bilaterally. Cardiovascular: Normal S1 and S2 without murmur or rub. Extremities: No cyanosis. pulses are equal. Neurologic: Alert and oriented. No involuntary movements.  LABS: Recent Results (from the past 2160 hour(s))  POCT HgB A1C     Status: Abnormal   Collection Time: 12/08/19 10:14 AM  Result Value Ref Range   Hemoglobin A1C 7.2 (A) 4.0 - 5.6 %  HbA1c POC (<> result, manual entry)     HbA1c, POC (prediabetic range)     HbA1c, POC (controlled diabetic range)      Radiology: DG Knee Complete 4 Views Right  Result Date: 12/15/2019 CLINICAL DATA:  Anterior right knee pain, pain with ambulation EXAM: RIGHT KNEE - COMPLETE 4+ VIEW COMPARISON:  None. FINDINGS: Frontal, lateral, tunnel view, and sunrise view of the right knee are obtained. There is 3 compartmental osteoarthritis greatest in the medial compartment. Lucency involving the posterior aspect of the medial femoral condyle likely reflects an osteochondral defect. There is significant medial compartmental joint space narrowing and marginal osteophyte formation. No acute displaced fracture.  Moderate joint effusion. IMPRESSION: 1. 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 2. Likely osteochondral defect posterior aspect medial femoral condyle. 3. Moderate joint effusion. Electronically Signed   By: Randa Ngo M.D.   On: 12/15/2019 15:27    No results found.  No results found.    Assessment and Plan: Patient Active Problem List   Diagnosis Date Noted  . Small bowel obstruction due to adhesions ([place]) 03/12/2019  .  Pleural effusion 08/06/2018  . Encounter for general adult medical examination with abnormal findings 03/20/2018  . Chronic otitis externa of both ears 03/20/2018  . Uncontrolled type 2 diabetes mellitus with hypoglycemia ([place]) 03/20/2018  . Dysuria 03/20/2018  . SOB (shortness of breath) 01/22/2018  . Cough 01/14/2018  . Constipation 12/30/2017  . Non-seasonal allergic rhinitis due to pollen 12/30/2017  . Dehydration 12/30/2017  . Essential hypertension 12/29/2017  . Uncontrolled type 2 diabetes mellitus with hyperglycemia ([place]) 12/27/2017  . Acute upper respiratory infection 12/27/2017  . Need for vaccination against Streptococcus pneumoniae using pneumococcal conjugate vaccine 13 12/27/2017  . Coronary artery disease due to lipid rich plaque 12/27/2017  . Mixed hyperlipidemia 12/27/2017  . AKI (acute kidney injury) ([place]) 12/20/2017  . OSA on CPAP 02/01/2016  . Aortic ejection murmur 08/14/2015  . Tracheostomy in place [facility]) 09/22/2013  . Ileus ([place]) 09/20/2013  . Anemia 09/19/2013  . Hypercarbia 09/19/2013  . Postoperative anemia due to acute blood loss 09/06/2013  . Thrombocytopenia ([place]) 09/06/2013  . 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 Abnormal stress ECG 09/01/2013  . Asthma in adult 09/01/2013  . S/P appendectomy 09/01/2013    1. OSA on CPAP New cpap machine ordered for patient at this time.  - For home use only DME continuous positive airway pressure (CPAP)  2. Mild intermittent asthma in adult without complication Controlled, continue present mgmt.  3. Non-seasonal allergic rhinitis due to pollen No recent issues, continue to monitor.   4. Essential hypertension Stable, continue to monitor.   General Counseling: I have discussed the findings of the evaluation and examination with Jaysin.  I have also discussed any further diagnostic evaluation thatmay be needed or ordered today. Quoc verbalizes understanding of the findings of todays  visit. We also reviewed his medications today and discussed drug interactions and side effects including but not limited excessive drowsiness and altered mental states. We also discussed that there is always a risk not just to him but also people around him. he has been encouraged to call the office with any questions or concerns that should arise related to todays visit.  Orders Placed This Encounter  Procedures  . For home use only DME continuous positive airway pressure (CPAP)    Order Specific Question:   Length of Need    Answer:   Lifetime  Order Specific Question:   Patient has OSA or probable OSA    Answer:   Yes    Order Specific Question:   Settings    Answer:   Other see comments    Order Specific Question:   CPAP supplies needed    Answer:   Mask, headgear, cushions, filters, heated tubing and water chamber     Time spent: 30 This patient was seen by Orson Gear AGNP-C in Collaboration with Dr. Devona Konig as a part of collaborative care agreement.   I have personally obtained a history, examined the patient, evaluated laboratory and imaging results, formulated the assessment and plan and placed orders.    Allyne Gee, MD [facility] Pulmonary and Critical Care Sleep medicine

## 2020-01-26 ENCOUNTER — Telehealth: Payer: Self-pay

## 2020-01-26 NOTE — Telephone Encounter (Signed)
Gave [facility] orders for new cpap, he is due and does not want to go forward with any other sleep studies. Beth

## 2020-02-07 ENCOUNTER — Other Ambulatory Visit: Payer: Self-pay

## 2020-02-07 ENCOUNTER — Emergency Department
Admission: EM | Admit: 2020-02-07 | Discharge: 2020-02-07 | Disposition: A | Discharge status: Home / Self Care | Payer: Medicare Other | Attending: Emergency Medicine | Admitting: Emergency Medicine

## 2020-02-07 ENCOUNTER — Encounter: Payer: Self-pay | Admitting: Emergency Medicine

## 2020-02-07 ENCOUNTER — Emergency Department: Payer: Medicare Other

## 2020-02-07 DIAGNOSIS — Z5321 Procedure and treatment not carried out due to patient leaving prior to being seen by health care provider: Secondary | ICD-10-CM | POA: Insufficient documentation

## 2020-02-07 DIAGNOSIS — R55 Syncope and collapse: Secondary | ICD-10-CM | POA: Diagnosis not present

## 2020-02-07 LAB — URINALYSIS, COMPLETE (UACMP) WITH MICROSCOPIC
Bacteria, UA: NONE SEEN
Bilirubin Urine: NEGATIVE
Glucose, UA: >=500 mg/dL — AB
Hgb urine dipstick: NEGATIVE
Ketones, ur: NEGATIVE mg/dL
Leukocytes,Ua: NEGATIVE
Nitrite: NEGATIVE
Protein, ur: NEGATIVE mg/dL
Specific Gravity, Urine: 1.013 (ref 1.005–1.030)
Squamous Epithelial / HPF: NONE SEEN (ref 0–5)
pH: 5 (ref 5.0–8.0)

## 2020-02-07 LAB — BASIC METABOLIC PANEL
Anion gap: 9 (ref 5–15)
BUN: 19 mg/dL (ref 8–23)
CO2: 26 mmol/L (ref 22–32)
Calcium: 8.6 mg/dL — ABNORMAL LOW (ref 8.9–10.3)
Chloride: 92 mmol/L — ABNORMAL LOW (ref 98–111)
Creatinine, Ser: 1.31 mg/dL — ABNORMAL HIGH (ref 0.61–1.24)
GFR calc Af Amer: 60 mL/min — ABNORMAL LOW (ref >60)
GFR calc non Af Amer: 51 mL/min — ABNORMAL LOW (ref >60)
Glucose, Bld: 325 mg/dL — ABNORMAL HIGH (ref 70–99)
Potassium: 4.6 mmol/L (ref 3.5–5.1)
Sodium: 127 mmol/L — ABNORMAL LOW (ref 135–145)

## 2020-02-07 LAB — CBC
HCT: 39.9 % (ref 39.0–52.0)
Hemoglobin: 13.1 g/dL (ref 13.0–17.0)
MCH: 27.9 pg (ref 26.0–34.0)
MCHC: 32.8 g/dL (ref 30.0–36.0)
MCV: 84.9 fL (ref 80.0–100.0)
Platelets: 205 10*3/uL (ref 150–400)
RBC: 4.7 MIL/uL (ref 4.22–5.81)
RDW: 13.2 % (ref 11.5–15.5)
WBC: 11.9 10*3/uL — ABNORMAL HIGH (ref 4.0–10.5)
nRBC: 0 % (ref 0.0–0.2)

## 2020-02-07 NOTE — ED Triage Notes (Signed)
Pt arrived via EMS from home where pt had syncopal episode in car port after becoming dizzy white cleaning outside of house. Pt remembers waking up on car port and then calling wife when he seen blood from head. Pt has laceration to the left side of forehead approx. 1 inch and several abrasions around area. Skin tear to the left hand as well. Pt is A&O x4.

## 2020-02-08 ENCOUNTER — Telehealth: Payer: Self-pay

## 2020-02-08 ENCOUNTER — Other Ambulatory Visit: Payer: Self-pay

## 2020-02-08 ENCOUNTER — Encounter: Payer: Self-pay | Admitting: Emergency Medicine

## 2020-02-08 ENCOUNTER — Emergency Department
Admission: EM | Admit: 2020-02-08 | Discharge: 2020-02-08 | Disposition: A | Discharge status: Home / Self Care | Payer: Medicare Other | Attending: Emergency Medicine | Admitting: Emergency Medicine

## 2020-02-08 DIAGNOSIS — Y939 Activity, unspecified: Secondary | ICD-10-CM | POA: Diagnosis not present

## 2020-02-08 DIAGNOSIS — J45909 Unspecified asthma, uncomplicated: Secondary | ICD-10-CM | POA: Insufficient documentation

## 2020-02-08 DIAGNOSIS — Z79899 Other long term (current) drug therapy: Secondary | ICD-10-CM | POA: Diagnosis not present

## 2020-02-08 DIAGNOSIS — Y998 Other external cause status: Secondary | ICD-10-CM | POA: Diagnosis not present

## 2020-02-08 DIAGNOSIS — Z23 Encounter for immunization: Secondary | ICD-10-CM | POA: Diagnosis not present

## 2020-02-08 DIAGNOSIS — Z794 Long term (current) use of insulin: Secondary | ICD-10-CM | POA: Diagnosis not present

## 2020-02-08 DIAGNOSIS — Y929 Unspecified place or not applicable: Secondary | ICD-10-CM | POA: Diagnosis not present

## 2020-02-08 DIAGNOSIS — I2510 Atherosclerotic heart disease of native coronary artery without angina pectoris: Secondary | ICD-10-CM | POA: Diagnosis not present

## 2020-02-08 DIAGNOSIS — W19XXXA Unspecified fall, initial encounter: Secondary | ICD-10-CM | POA: Insufficient documentation

## 2020-02-08 DIAGNOSIS — Z7982 Long term (current) use of aspirin: Secondary | ICD-10-CM | POA: Insufficient documentation

## 2020-02-08 DIAGNOSIS — E119 Type 2 diabetes mellitus without complications: Secondary | ICD-10-CM | POA: Insufficient documentation

## 2020-02-08 DIAGNOSIS — Z4800 Encounter for change or removal of nonsurgical wound dressing: Secondary | ICD-10-CM | POA: Diagnosis not present

## 2020-02-08 DIAGNOSIS — Z5189 Encounter for other specified aftercare: Secondary | ICD-10-CM | POA: Insufficient documentation

## 2020-02-08 DIAGNOSIS — Z951 Presence of aortocoronary bypass graft: Secondary | ICD-10-CM | POA: Diagnosis not present

## 2020-02-08 DIAGNOSIS — E871 Hypo-osmolality and hyponatremia: Secondary | ICD-10-CM | POA: Insufficient documentation

## 2020-02-08 DIAGNOSIS — I10 Essential (primary) hypertension: Secondary | ICD-10-CM | POA: Diagnosis not present

## 2020-02-08 DIAGNOSIS — S0990XA Unspecified injury of head, initial encounter: Secondary | ICD-10-CM | POA: Diagnosis not present

## 2020-02-08 MED ORDER — TETANUS-DIPHTH-ACELL PERTUSSIS 5-2.5-18.5 LF-MCG/0.5 IM SUSP
0.5000 mL | Freq: Once | INTRAMUSCULAR | Status: AC
  Administered 2020-02-08: 0.5 mL via INTRAMUSCULAR
  Filled 2020-02-08: qty 0.5

## 2020-02-08 NOTE — ED Provider Notes (Signed)
[facility] Emergency Department Provider Note  ____________________________________________   First MD Initiated Contact with Patient 02/08/20 1003     (approximate)  I have reviewed the triage vital signs and the nursing notes.   HISTORY  Chief Complaint Wound Check    HPI Daniel Hodges is a 79 y.o. male presents emergency department after a fall yesterday.  Patient was seen here and protocols were performed but he left without being seen.  He states that his head is still bleeding areas.  Did know if he needed stitches.  He denies any other complaints at this time.    Past Medical History:  Diagnosis Date  . Anginal pain ([place])   . Asthma   . Coronary artery disease   . Diabetes mellitus without complication ([place])   . Hyperlipidemia   . Hypertension   . Sleep apnea     Patient Active Problem List   Diagnosis Date Noted  . Small bowel obstruction due to adhesions ([place]) 03/12/2019  . Pleural effusion 08/06/2018  . Encounter for general adult medical examination with abnormal findings 03/20/2018  . Chronic otitis externa of both ears 03/20/2018  . Uncontrolled type 2 diabetes mellitus with hypoglycemia ([place]) 03/20/2018  . Dysuria 03/20/2018  . SOB (shortness of breath) 01/22/2018  . Cough 01/14/2018  . Constipation 12/30/2017  . Non-seasonal allergic rhinitis due to pollen 12/30/2017  . Dehydration 12/30/2017  . Essential hypertension 12/29/2017  . Uncontrolled type 2 diabetes mellitus with hyperglycemia ([place]) 12/27/2017  . Acute upper respiratory infection 12/27/2017  . Need for vaccination against Streptococcus pneumoniae using pneumococcal conjugate vaccine 13 12/27/2017  . Coronary artery disease due to lipid rich plaque 12/27/2017  . Mixed hyperlipidemia 12/27/2017  . AKI (acute kidney injury) ([place]) 12/20/2017  . OSA on CPAP 02/01/2016  . Aortic ejection murmur 08/14/2015  . Tracheostomy in place [facility]) 09/22/2013  . Ileus ([place])  09/20/2013  . Anemia 09/19/2013  . Hypercarbia 09/19/2013  . Postoperative anemia due to acute blood loss 09/06/2013  . Thrombocytopenia ([place]) 09/06/2013  . 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 Abnormal stress ECG 09/01/2013  . Asthma in adult 09/01/2013  . S/P appendectomy 09/01/2013    Past Surgical History:  Procedure Laterality Date  . APPENDECTOMY    . COLONOSCOPY WITH PROPOFOL N/A 06/11/2015   Procedure: COLONOSCOPY WITH PROPOFOL;  Surgeon: Manya Silvas, MD;  [facility] ENDOSCOPY;  Service: Endoscopy;  Laterality: N/A;  . CORONARY ARTERY BYPASS GRAFT    . HERNIA REPAIR    . TEE WITHOUT CARDIOVERSION    . TRACHEOSTOMY    . VASCULAR SURGERY      Prior to Admission medications   Medication Sig Start Date End Date Taking? Authorizing Provider  amLODipine (NORVASC) 2.5 MG tablet TAKE 1 TABLET(2.5 MG) BY MOUTH DAILY 12/12/19   Kendell Bane, NP  aspirin 81 MG tablet Take 81 mg by mouth daily.    [provider]  atorvastatin (LIPITOR) 20 MG tablet Take 1 tablet (20 mg total) by mouth at bedtime. 11/08/19   Kendell Bane, NP  budesonide-formoterol (SYMBICORT) 160-4.5 MCG/ACT inhaler Inhale 2 puffs into the lungs 2 (two) times daily.     [provider]  cholecalciferol (VITAMIN D3) 25 MCG (1000 UT) tablet Take 1,000 Units by mouth daily.    [provider]  ferrous sulfate 325 (65 FE) MG tablet Take 1 tablet (325 mg total) by mouth 2 (two) times daily with a meal. 03/11/19  Ronnell Freshwater, NP  furosemide (LASIX) 40 MG tablet Take 0.5 tablets (20 mg total) by mouth daily. 09/13/19   Ronnell Freshwater, NP  gabapentin (NEURONTIN) 300 MG capsule TAKE 1 CAPSULE BY MOUTH DAILY AND 1 CAPSULE NIGHTLY 12/28/19   Kendell Bane, NP  HUMALOG KWIKPEN 100 UNIT/ML KwikPen Inject 6-10 Units into the skin See admin instructions. Inject 6u under the skin daily at breakfast-time, 8u at lunch-time and inject 10u under the skin daily at  dinner-time 11/23/18   [provider]  ipratropium-albuterol (DUONEB) 0.5-2.5 (3) MG/3ML SOLN USE 3 ML VIA NEBULIZER EVERY 6 HOURS AS NEEDED 06/28/19   Kendell Bane, NP  loratadine (CLARITIN) 10 MG tablet Take 1 tablet (10 mg total) by mouth daily. 09/06/19   Ronnell Freshwater, NP  Magnesium 250 MG TABS Take 250 mg by mouth 2 (two) times daily.    [provider]  metFORMIN (GLUMETZA) 1000 MG (MOD) 24 hr tablet Take 1 tablet (1,000 mg total) by mouth daily with breakfast. 10/13/18   Boscia, Greer Ee, NP  mometasone (NASONEX) 50 MCG/ACT nasal spray Place 2 sprays into the nose daily. 03/23/19   Lavera Guise, MD  montelukast (SINGULAIR) 10 MG tablet Take 1 tablet (10 mg total) by mouth daily. 05/02/19   Ronnell Freshwater, NP  Multiple Vitamin (MULTIVITAMIN WITH MINERALS) TABS tablet Take 1 tablet by mouth daily.    [provider]  niacin 500 MG CR capsule Take 1 capsule (500 mg total) by mouth at bedtime. Patient taking differently: Take 500 mg by mouth daily.  03/11/19   Ronnell Freshwater, NP  ONE TOUCH ULTRA TEST test strip USE THREE TIMES DAILY 02/18/18   Ronnell Freshwater, NP  senna (SENOKOT) 8.6 MG tablet Take 1 tablet (8.6 mg total) by mouth daily. 04/15/19   Ronnell Freshwater, NP  simethicone (MYLICON) 80 MG chewable tablet Chew 2 tablets (160 mg total) by mouth 2 (two) times daily. 04/15/19   Boscia, Greer Ee, NP  TOUJEO SOLOSTAR 300 UNIT/ML SOPN Inject 25 Units into the skin at bedtime.  11/23/18   [provider]  vitamin B-12 (CYANOCOBALAMIN) 1000 MCG tablet Take 1,000 mcg by mouth daily.    [provider]    Allergies Penicillins and Neostigmine  Family History  Problem Relation Age of Onset  . Cancer Sister   . Diabetes Daughter   . Diabetes Son     Social History Social History   Tobacco Use  . Smoking status: Never Smoker  . Smokeless tobacco: Never Used  Substance Use Topics  . Alcohol use: No  . Drug use: No    Review of  Systems  Constitutional: No fever/chills Eyes: No visual changes. ENT: No sore throat. Respiratory: Denies cough Cardiovascular: Denies chest pain Gastrointestinal: Denies abdominal pain Genitourinary: Negative for dysuria. Musculoskeletal: Negative for back pain. Skin: Negative for rash.  Positive laceration Psychiatric: no mood changes,     ____________________________________________   PHYSICAL EXAM:  VITAL SIGNS: ED Triage Vitals  Enc Vitals Group     BP 02/08/20 0924 121/66     Pulse Rate 02/08/20 0924 84     Resp 02/08/20 0924 18     Temp 02/08/20 0924 98.3 F (36.8 C)     Temp Source 02/08/20 0924 Oral     SpO2 02/08/20 0924 98 %     Weight 02/08/20 0924 168 lb (76.2 kg)     Height 02/08/20 0924 5\' 6"  (1.676 m)  Head Circumference --      Peak Flow --      Pain Score 02/08/20 0926 3     Pain Loc --      Pain Edu? --      Excl. in [place]? --     Constitutional: Alert and oriented. Well appearing and in no acute distress. Eyes: Conjunctivae are normal.  Head: Multiple skin avulsions noted to the top of the scalp, skin tear noted at the left hand Nose: No congestion/rhinnorhea. Mouth/Throat: Mucous membranes are moist.   Neck:  supple no lymphadenopathy noted Cardiovascular: Normal rate, regular rhythm. Heart sounds are normal Respiratory: Normal respiratory effort.  No retractions, lungs c t a  GU: deferred Musculoskeletal: FROM all extremities, warm and well perfused Neurologic:  Normal speech and language.  Skin:  Skin is warm, dry  No rash noted. Psychiatric: Mood and affect are normal. Speech and behavior are normal.  ____________________________________________   LABS (all labs ordered are listed, but only abnormal results are displayed)  Labs Reviewed - No data to  display ____________________________________________   ____________________________________________  RADIOLOGY    ____________________________________________   PROCEDURES  Procedure(s) performed: No  Procedures    ____________________________________________   INITIAL IMPRESSION / ASSESSMENT AND PLAN / ED COURSE  Pertinent labs & imaging results that were available during my care of the patient were reviewed by me and considered in my medical decision making (see chart for details).   Patient 79 year old male presents emergency department after leaving without being seen yesterday.  Patient is concerned about the wound on his head and his CT results.  See HPI  Physical exam patient has multiple skin tears.  No wound to suture.  Remainder the exam is unremarkable at this time.  He is able to stand without getting dizzy.  Able to ambulate around the room without difficulty.  CT from yesterday of the head and C-spine are very reassuring did not show any acute abnormalities.  Lab work was also reassuring except his sodium level was low at 127.  Due to the fact that he is asymptomatic at this time I feel he would be stable to discharge and have him add more salt to his diet.  I did discuss this with him and his wife via phone.  States he understands.  Nursing staff to apply nonstick dressing to the scalp.  He was discharged stable condition and Tdap was updated.    Daniel Hodges was evaluated in Emergency Department on 02/08/2020 for the symptoms described in the history of present illness. He was evaluated in the context of the global COVID-19 pandemic, which necessitated consideration that the patient might be at risk for infection with the SARS-CoV-2 virus that causes COVID-19. Institutional protocols and algorithms that pertain to the evaluation of patients at risk for COVID-19 are in a state of rapid change based on information released by regulatory bodies including the CDC  and federal and state organizations. These policies and algorithms were followed during the patient's care in the ED.   As part of my medical decision making, I reviewed the following data within the [place] History obtained from family, Nursing notes reviewed and incorporated, Labs reviewed from yesterday are as above, Old chart reviewed, Radiograph reviewed CT of the head and C-spine are negative for any injury from yesterday, Notes from prior ED visits and [place] Controlled Substance Database  ____________________________________________   FINAL CLINICAL IMPRESSION(S) / ED DIAGNOSES  Final diagnoses:  Visit for wound  check  Fall, initial encounter  Minor head injury, initial encounter  Hyponatremia      NEW MEDICATIONS STARTED DURING THIS VISIT:  New Prescriptions   No medications on file     Note:  This document was prepared using Dragon voice recognition software and may include unintentional dictation errors.    Versie Starks, PA-C 02/08/20 1048    Vanessa Anthony, MD 02/09/20 [phone]

## 2020-02-08 NOTE — Telephone Encounter (Signed)
done

## 2020-02-08 NOTE — Telephone Encounter (Signed)
Confirmed appointment on 02/10/2020. klh

## 2020-02-08 NOTE — ED Notes (Addendum)
See triage note  States he fell yesterday  Returns b/c he thinks his head wound is still bleeding   Min discharge noted to dressing  States he fell yesterday while in the carport   Hit head  Abrasions to top of head

## 2020-02-08 NOTE — ED Triage Notes (Signed)
Patient presents to the ED after being seen in the ED yesterday for a fall.  Patient has a large bandage on his head with slight weeping through on the bandage.  Patient's wife states she is concerned that patient's wound may still be bleeding.  Patient is in no obvious distress at this time.  Patient is alert and oriented x 4.  Answering questions appropriately.

## 2020-02-08 NOTE — Discharge Instructions (Addendum)
Follow-up with your regular doctor in 2 days for a recheck of your sodium level.  Please eat extra salt in your diet.  Return emergency department if you become weak or dizzy.

## 2020-02-10 ENCOUNTER — Ambulatory Visit: Payer: Medicare Other

## 2020-02-10 DIAGNOSIS — I739 Peripheral vascular disease, unspecified: Secondary | ICD-10-CM

## 2020-02-12 ENCOUNTER — Other Ambulatory Visit: Payer: Self-pay | Admitting: Adult Health

## 2020-02-12 DIAGNOSIS — I10 Essential (primary) hypertension: Secondary | ICD-10-CM

## 2020-02-14 ENCOUNTER — Ambulatory Visit: Payer: Medicare Other | Admitting: Internal Medicine

## 2020-02-16 ENCOUNTER — Telehealth: Payer: Self-pay

## 2020-02-16 NOTE — Telephone Encounter (Signed)
Confirmed and screened for 02-21-20 ov. 

## 2020-02-21 ENCOUNTER — Ambulatory Visit (INDEPENDENT_AMBULATORY_CARE_PROVIDER_SITE_OTHER): Payer: Medicare Other | Admitting: Internal Medicine

## 2020-02-21 ENCOUNTER — Other Ambulatory Visit: Payer: Self-pay

## 2020-02-21 ENCOUNTER — Encounter: Payer: Self-pay | Admitting: Internal Medicine

## 2020-02-21 DIAGNOSIS — I10 Essential (primary) hypertension: Secondary | ICD-10-CM

## 2020-02-21 DIAGNOSIS — M1711 Unilateral primary osteoarthritis, right knee: Secondary | ICD-10-CM | POA: Diagnosis not present

## 2020-02-21 DIAGNOSIS — L259 Unspecified contact dermatitis, unspecified cause: Secondary | ICD-10-CM

## 2020-02-21 DIAGNOSIS — E11649 Type 2 diabetes mellitus with hypoglycemia without coma: Secondary | ICD-10-CM

## 2020-02-21 NOTE — Progress Notes (Signed)
[facility] [place], [place] [address]  Internal MEDICINE  Office Visit Note  Patient Name: Daniel Hodges  [id number]  [id number]  Date of Service: 02/27/2020  Chief Complaint  Patient presents with  . Hospitalization Follow-up    hyponatremia, fall, head injury   . Hypertension  . Hyperlipidemia  . Asthma  . Diabetes  . Follow-up    review ultrasound     HPI Pt is here for routine follow up. R knee is better, waiting on insurance approval for possible knee replacement. ABI- arterial stuy is normal. He does feel better, there is itching and redness of right lower ext Wife in the room with him, thinks blood sugar is better, less hypoglycemics events, he does take metformin Recent cr is 1.3 with gfr of 60.  Bp is under good control  Current Medication: Outpatient Encounter Medications as of 02/21/2020  Medication Sig  . amLODipine (NORVASC) 2.5 MG tablet TAKE 1 TABLET BY MOUTH  DAILY  . aspirin 81 MG tablet Take 81 mg by mouth daily.  Marland Kitchen atorvastatin (LIPITOR) 20 MG tablet Take 1 tablet (20 mg total) by mouth at bedtime.  . budesonide-formoterol (SYMBICORT) 160-4.5 MCG/ACT inhaler Inhale 2 puffs into the lungs 2 (two) times daily.   . cholecalciferol (VITAMIN D3) 25 MCG (1000 UT) tablet Take 1,000 Units by mouth daily.  . furosemide (LASIX) 40 MG tablet Take 0.5 tablets (20 mg total) by mouth daily.  Marland Kitchen gabapentin (NEURONTIN) 300 MG capsule TAKE 1 CAPSULE BY MOUTH DAILY AND 1 CAPSULE NIGHTLY  . HUMALOG KWIKPEN 100 UNIT/ML KwikPen Inject 6-10 Units into the skin See admin instructions. Inject 6u under the skin daily at breakfast-time, 8u at lunch-time and inject 10u under the skin daily at dinner-time  . ipratropium-albuterol (DUONEB) 0.5-2.5 (3) MG/3ML SOLN USE 3 ML VIA NEBULIZER EVERY 6 HOURS AS NEEDED  . loratadine (CLARITIN) 10 MG tablet Take 1 tablet (10 mg total) by mouth daily.  . Magnesium 250 MG TABS Take 250 mg by mouth 2 (two) times daily.  .  mometasone (NASONEX) 50 MCG/ACT nasal spray Place 2 sprays into the nose daily.  . montelukast (SINGULAIR) 10 MG tablet Take 1 tablet (10 mg total) by mouth daily.  . Multiple Vitamin (MULTIVITAMIN WITH MINERALS) TABS tablet Take 1 tablet by mouth daily.  . ONE TOUCH ULTRA TEST test strip USE THREE TIMES DAILY  . senna (SENOKOT) 8.6 MG tablet Take 1 tablet (8.6 mg total) by mouth daily.  . simethicone (MYLICON) 80 MG chewable tablet Chew 2 tablets (160 mg total) by mouth 2 (two) times daily.  [name] SOLOSTAR 300 UNIT/ML SOPN Inject 25 Units into the skin at bedtime.   . vitamin B-12 (CYANOCOBALAMIN) 1000 MCG tablet Take 1,000 mcg by mouth daily.  . [DISCONTINUED] ferrous sulfate 325 (65 FE) MG tablet Take 1 tablet (325 mg total) by mouth 2 (two) times daily with a meal.  . [DISCONTINUED] metFORMIN (GLUMETZA) 1000 MG (MOD) 24 hr tablet Take 1 tablet (1,000 mg total) by mouth daily with breakfast.  . [DISCONTINUED] niacin 500 MG CR capsule Take 1 capsule (500 mg total) by mouth at bedtime. (Patient taking differently: Take 500 mg by mouth daily. )  . hydrocortisone 1 % ointment Apply small amount mix with lotion at night for leg rash   No facility-administered encounter medications on file as of 02/21/2020.    Surgical History: Past Surgical History:  Procedure Laterality Date  . APPENDECTOMY    . COLONOSCOPY  WITH PROPOFOL N/A 06/11/2015   Procedure: COLONOSCOPY WITH PROPOFOL;  Surgeon: Manya Silvas, MD;  Location: [facility] ENDOSCOPY;  Service: Endoscopy;  Laterality: N/A;  . CORONARY ARTERY BYPASS GRAFT    . HERNIA REPAIR    . TEE WITHOUT CARDIOVERSION    . TRACHEOSTOMY    . VASCULAR SURGERY      Medical History: Past Medical History:  Diagnosis Date  . Anginal pain ([place])   . Asthma   . Coronary artery disease   . Diabetes mellitus without complication ([place])   . Hyperlipidemia   . Hypertension   . Sleep apnea     Family History: Family History  Problem Relation Age of Onset   . Cancer Sister   . Diabetes Daughter   . Diabetes Son     Social History   Socioeconomic History  . Marital status: Married    Spouse name: Not on file  . Number of children: Not on file  . Years of education: Not on file  . Highest education level: Not on file  Occupational History  . Not on file  Tobacco Use  . Smoking status: Never Smoker  . Smokeless tobacco: Never Used  Vaping Use  . Vaping Use: Never used  Substance and Sexual Activity  . Alcohol use: No  . Drug use: No  . Sexual activity: Not on file  Other Topics Concern  . Not on file  Social History Narrative  . Not on file   Social Determinants of Health   Financial Resource Strain:   . Difficulty of Paying Living Expenses:   Food Insecurity:   . Worried About Charity fundraiser in the Last Year:   . Arboriculturist in the Last Year:   Transportation Needs:   . Film/video editor (Medical):   Marland Kitchen Lack of Transportation (Non-Medical):   Physical Activity:   . Days of Exercise per Week:   . Minutes of Exercise per Session:   Stress:   . Feeling of Stress :   Social Connections:   . Frequency of Communication with Friends and Family:   . Frequency of Social Gatherings with Friends and Family:   . Attends Religious Services:   . Active Member of Clubs or Organizations:   . Attends Archivist Meetings:   Marland Kitchen Marital Status:   Intimate Partner Violence:   . Fear of Current or Ex-Partner:   . Emotionally Abused:   Marland Kitchen Physically Abused:   . Sexually Abused:    Review of Systems  Constitutional: Negative for chills, fatigue and unexpected weight change.  HENT: Negative for congestion, rhinorrhea, sneezing and sore throat.   Eyes: Negative for redness.  Respiratory: Negative for cough, chest tightness and shortness of breath.   Cardiovascular: Negative for chest pain and palpitations.  Gastrointestinal: Negative for abdominal pain, constipation, diarrhea, nausea and vomiting.  Endocrine:        Hypoglycemic events ( improving )  Genitourinary: Negative for dysuria and frequency.  Musculoskeletal: Positive for arthralgias and joint swelling. Negative for back pain and neck pain.  Skin: Positive for rash.  Neurological: Negative.  Negative for tremors and numbness.  Hematological: Negative for adenopathy. Bruises/bleeds easily.  Psychiatric/Behavioral: Negative for behavioral problems (Depression), sleep disturbance and suicidal ideas. The patient is not nervous/anxious.     Vital Signs: BP 132/76   Pulse 76   Temp (!) 97.2 F (36.2 C)   Resp 16   Ht 5\' 6"  (1.676 m)   Wt  167 lb 6.4 oz (75.9 kg)   SpO2 99%   BMI 27.02 kg/m    Physical Exam Constitutional:      General: He is not in acute distress.    Appearance: He is well-developed. He is not diaphoretic.  HENT:     Head: Normocephalic and atraumatic.     Mouth/Throat:     Pharynx: No oropharyngeal exudate.  Eyes:     Pupils: Pupils are equal, round, and reactive to light.  Neck:     Thyroid: No thyromegaly.     Vascular: No JVD.     Trachea: No tracheal deviation.  Cardiovascular:     Rate and Rhythm: Normal rate and regular rhythm.     Heart sounds: Normal heart sounds. No murmur heard.  No friction rub. No gallop.   Pulmonary:     Effort: Pulmonary effort is normal. No respiratory distress.     Breath sounds: No wheezing or rales.  Chest:     Chest wall: No tenderness.  Abdominal:     General: Bowel sounds are normal.     Palpations: Abdomen is soft.  Musculoskeletal:        General: Normal range of motion.     Cervical back: Normal range of motion and neck supple.  Lymphadenopathy:     Cervical: No cervical adenopathy.  Skin:    General: Skin is warm and dry.     Findings: Rash present.     Comments: Right shin   Neurological:     Mental Status: He is alert and oriented to person, place, and time.     Cranial Nerves: No cranial nerve deficit.  Psychiatric:        Behavior: Behavior  normal.        Thought Content: Thought content normal.        Judgment: Judgment normal.    Assessment/Plan: 1. Uncontrolled type 2 diabetes mellitus with hypoglycemia without coma ([place]) - DC metformin due to elevated cr.Contine Humalog, Toujeo as before  - Basic metabolic panel  2. Contact dermatitis and eczema - Apply HC ointment 1% bid prn.  3. Primary osteoarthritis of right knee - per ortho// ABI arterial study is reviewed with him, it is normal   4. Essential hypertension - Stable, follow up cr, labs ordered   General Counseling: Kimon verbalizes understanding of the findings of todays visit and agrees with plan of treatment. I have discussed any further diagnostic evaluation that may be needed or ordered today. We also reviewed his medications today. he has been encouraged to call the office with any questions or concerns that should arise related to todays visit.  Orders Placed This Encounter  Procedures  . Basic metabolic panel   Meds ordered this encounter  Medications  . hydrocortisone 1 % ointment    Sig: Apply small amount mix with lotion at night for leg rash    Dispense:  30 g    Refill:  0    Total time spent:35 Minutes Time spent includes review of chart, medications, test results, and follow up plan with the patient.   Dr Lavera Guise Internal medicine

## 2020-02-27 ENCOUNTER — Other Ambulatory Visit: Payer: Self-pay

## 2020-02-27 MED ORDER — HYDROCORTISONE 1 % EX OINT: TOPICAL_OINTMENT | CUTANEOUS | 0 refills | Status: AC

## 2020-03-08 ENCOUNTER — Other Ambulatory Visit: Payer: Self-pay

## 2020-03-08 DIAGNOSIS — J301 Allergic rhinitis due to pollen: Secondary | ICD-10-CM

## 2020-03-08 MED ORDER — MONTELUKAST SODIUM 10 MG PO TABS: 10.0000 mg | ORAL_TABLET | Freq: Every day | ORAL | 5 refills | Status: DC

## 2020-03-16 ENCOUNTER — Ambulatory Visit (INDEPENDENT_AMBULATORY_CARE_PROVIDER_SITE_OTHER): Payer: Medicare Other | Admitting: Nurse Practitioner

## 2020-03-16 ENCOUNTER — Other Ambulatory Visit: Payer: Self-pay

## 2020-03-16 ENCOUNTER — Encounter: Payer: Self-pay | Admitting: Nurse Practitioner

## 2020-03-16 ENCOUNTER — Telehealth: Payer: Self-pay

## 2020-03-16 VITALS — BP 142/72 | HR 64 | Temp 97.2°F | Resp 16 | Ht 66.0 in | Wt 164.0 lb

## 2020-03-16 DIAGNOSIS — I10 Essential (primary) hypertension: Secondary | ICD-10-CM

## 2020-03-16 DIAGNOSIS — R0602 Shortness of breath: Secondary | ICD-10-CM

## 2020-03-16 DIAGNOSIS — I2510 Atherosclerotic heart disease of native coronary artery without angina pectoris: Secondary | ICD-10-CM

## 2020-03-16 DIAGNOSIS — Z0001 Encounter for general adult medical examination with abnormal findings: Secondary | ICD-10-CM

## 2020-03-16 DIAGNOSIS — E1165 Type 2 diabetes mellitus with hyperglycemia: Secondary | ICD-10-CM | POA: Diagnosis not present

## 2020-03-16 DIAGNOSIS — J45909 Unspecified asthma, uncomplicated: Secondary | ICD-10-CM | POA: Diagnosis not present

## 2020-03-16 DIAGNOSIS — I2583 Coronary atherosclerosis due to lipid rich plaque: Secondary | ICD-10-CM

## 2020-03-16 DIAGNOSIS — R300 Dysuria: Secondary | ICD-10-CM | POA: Diagnosis not present

## 2020-03-16 NOTE — Telephone Encounter (Signed)
Called [facility] that we gave nebulizer to pt and also put order in epic

## 2020-03-16 NOTE — Progress Notes (Signed)
[facility] [place], [place] [address]  Internal MEDICINE  Office Visit Note  Patient Name: Daniel Hodges  [id number]  [id number]  Date of Service: 03/24/2020   Pt is here for routine health maintenance examination  Chief Complaint  Patient presents with  . Medicare Wellness  . Diabetes  . Hyperlipidemia  . Hypertension     The patient is here for health maintenance exam. Seeing endocrinology for blood sugar control. Blood sugars are good in the mornings and higher in the afternoons and evenings. Does snack a bit in between meals.  Blood pressure a little elevated today. Continues to see cardiology for heart related issues.  He is treated in our office for moderate persistent asthma. Uses a nebulizer for acute exacerbations nad wheezing. He needs to have new nebulizer machine. He has been using his daughter's old nebulizer machine and tubing he has is falling apart.     Current Medication: Outpatient Encounter Medications as of 03/16/2020  Medication Sig  . amLODipine (NORVASC) 2.5 MG tablet TAKE 1 TABLET BY MOUTH  DAILY  . aspirin 81 MG tablet Take 81 mg by mouth daily.  Marland Kitchen atorvastatin (LIPITOR) 20 MG tablet Take 1 tablet (20 mg total) by mouth at bedtime.  . budesonide-formoterol (SYMBICORT) 160-4.5 MCG/ACT inhaler Inhale 2 puffs into the lungs 2 (two) times daily.   . cholecalciferol (VITAMIN D3) 25 MCG (1000 UT) tablet Take 1,000 Units by mouth daily.  . furosemide (LASIX) 40 MG tablet Take 0.5 tablets (20 mg total) by mouth daily.  Marland Kitchen gabapentin (NEURONTIN) 300 MG capsule TAKE 1 CAPSULE BY MOUTH DAILY AND 1 CAPSULE NIGHTLY  . HUMALOG KWIKPEN 100 UNIT/ML KwikPen Inject 6-10 Units into the skin See admin instructions. Inject 6u under the skin daily at breakfast-time, 8u at lunch-time and inject 10u under the skin daily at dinner-time  . hydrocortisone 1 % ointment Apply small amount mix with lotion at night for leg rash  . ipratropium-albuterol  (DUONEB) 0.5-2.5 (3) MG/3ML SOLN USE 3 ML VIA NEBULIZER EVERY 6 HOURS AS NEEDED  . loratadine (CLARITIN) 10 MG tablet Take 1 tablet (10 mg total) by mouth daily.  . Magnesium 250 MG TABS Take 250 mg by mouth 2 (two) times daily.  . mometasone (NASONEX) 50 MCG/ACT nasal spray Place 2 sprays into the nose daily.  . montelukast (SINGULAIR) 10 MG tablet Take 1 tablet (10 mg total) by mouth daily.  . Multiple Vitamin (MULTIVITAMIN WITH MINERALS) TABS tablet Take 1 tablet by mouth daily.  . ONE TOUCH ULTRA TEST test strip USE THREE TIMES DAILY  . senna (SENOKOT) 8.6 MG tablet Take 1 tablet (8.6 mg total) by mouth daily.  . simethicone (MYLICON) 80 MG chewable tablet Chew 2 tablets (160 mg total) by mouth 2 (two) times daily.  [name] SOLOSTAR 300 UNIT/ML SOPN Inject 25 Units into the skin at bedtime.   . vitamin B-12 (CYANOCOBALAMIN) 1000 MCG tablet Take 1,000 mcg by mouth daily.  . [DISCONTINUED] atorvastatin (LIPITOR) 20 MG tablet Take 1 tablet (20 mg total) by mouth at bedtime.  . [DISCONTINUED] ferrous sulfate 325 (65 FE) MG tablet Take 1 tablet (325 mg total) by mouth 2 (two) times daily with a meal.  . [DISCONTINUED] furosemide (LASIX) 40 MG tablet Take 0.5 tablets (20 mg total) by mouth daily.  . [DISCONTINUED] gabapentin (NEURONTIN) 300 MG capsule TAKE 1 CAPSULE BY MOUTH DAILY AND 1 CAPSULE NIGHTLY  . [DISCONTINUED] loratadine (CLARITIN) 10 MG tablet Take 1 tablet (10  mg total) by mouth daily.  . [DISCONTINUED] metFORMIN (GLUMETZA) 1000 MG (MOD) 24 hr tablet Take 1 tablet (1,000 mg total) by mouth daily with breakfast.  . [DISCONTINUED] montelukast (SINGULAIR) 10 MG tablet Take 1 tablet (10 mg total) by mouth daily.  . [DISCONTINUED] niacin 500 MG CR capsule Take 1 capsule (500 mg total) by mouth at bedtime. (Patient taking differently: Take 500 mg by mouth daily. )   No facility-administered encounter medications on file as of 03/16/2020.    Surgical History: Past Surgical History:   Procedure Laterality Date  . APPENDECTOMY    . COLONOSCOPY WITH PROPOFOL N/A 06/11/2015   Procedure: COLONOSCOPY WITH PROPOFOL;  Surgeon: Manya Silvas, MD;  Location: [facility] ENDOSCOPY;  Service: Endoscopy;  Laterality: N/A;  . CORONARY ARTERY BYPASS GRAFT    . HERNIA REPAIR    . TEE WITHOUT CARDIOVERSION    . TRACHEOSTOMY    . VASCULAR SURGERY      Medical History: Past Medical History:  Diagnosis Date  . Anginal pain ([place])   . Asthma   . Coronary artery disease   . Diabetes mellitus without complication ([place])   . Hyperlipidemia   . Hypertension   . Sleep apnea     Family History: Family History  Problem Relation Age of Onset  . Cancer Sister   . Diabetes Daughter   . Diabetes Son       Review of Systems  Constitutional: Negative for activity change, chills, fatigue and unexpected weight change.  HENT: Negative for congestion, postnasal drip, rhinorrhea, sneezing and sore throat.   Respiratory: Positive for shortness of breath and wheezing. Negative for cough and chest tightness.        The patient has intermittent asthma with some wheezing and shortness of breath. Uses a nebulizer for breaathing treatments when needed.   Cardiovascular: Negative for chest pain and palpitations.  Gastrointestinal: Negative for abdominal pain, constipation, diarrhea, nausea and vomiting.  Endocrine: Negative for cold intolerance, heat intolerance, polydipsia and polyuria.       Fluctuating blood sugars. Patient is seeing endocrinologist for diabetic control.   Genitourinary: Negative for dysuria, frequency, hematuria and urgency.  Musculoskeletal: Negative for arthralgias, back pain, joint swelling and neck pain.  Skin: Negative for rash.  Allergic/Immunologic: Positive for environmental allergies.  Neurological: Negative for dizziness, tremors, numbness and headaches.  Hematological: Negative for adenopathy. Does not bruise/bleed easily.  Psychiatric/Behavioral: Negative for  behavioral problems (Depression), sleep disturbance and suicidal ideas. The patient is not nervous/anxious.     Today's Vitals   03/16/20 1038  BP: (!) 142/72  Pulse: 64  Resp: 16  Temp: (!) 97.2 F (36.2 C)  SpO2: 99%  Weight: 164 lb (74.4 kg)  Height: 5\' 6"  (1.676 m)   Body mass index is 26.47 kg/m.   Physical Exam Vitals and nursing note reviewed.  Constitutional:      General: He is not in acute distress.    Appearance: Normal appearance. He is well-developed. He is not diaphoretic.  HENT:     Head: Normocephalic and atraumatic.     Nose: Nose normal.     Mouth/Throat:     Pharynx: No oropharyngeal exudate.  Eyes:     Pupils: Pupils are equal, round, and reactive to light.  Neck:     Thyroid: No thyromegaly.     Vascular: No carotid bruit or JVD.     Trachea: No tracheal deviation.  Cardiovascular:     Rate and Rhythm: Normal rate and regular  rhythm.     Pulses: Normal pulses.          Dorsalis pedis pulses are 2+ on the right side and 2+ on the left side.       Posterior tibial pulses are 2+ on the right side and 2+ on the left side.     Heart sounds: Normal heart sounds. No murmur heard.  No friction rub. No gallop.   Pulmonary:     Effort: Pulmonary effort is normal. No respiratory distress.     Breath sounds: Normal breath sounds. No wheezing or rales.  Chest:     Chest wall: No tenderness.  Abdominal:     General: Bowel sounds are normal.     Palpations: Abdomen is soft.     Tenderness: There is no abdominal tenderness.  Musculoskeletal:        General: Normal range of motion.     Cervical back: Normal range of motion and neck supple.     Right foot: Normal range of motion. No deformity or bunion.     Left foot: Normal range of motion. No deformity or bunion.  Feet:     Right foot:     Protective Sensation: 10 sites tested. 10 sites sensed.     Skin integrity: Skin integrity normal.     Toenail Condition: Right toenails are normal.     Left foot:      Protective Sensation: 10 sites tested. 10 sites sensed.     Skin integrity: Skin integrity normal.     Toenail Condition: Left toenails are normal.  Lymphadenopathy:     Cervical: No cervical adenopathy.  Skin:    General: Skin is warm and dry.  Neurological:     Mental Status: He is alert and oriented to person, place, and time.     Cranial Nerves: No cranial nerve deficit.  Psychiatric:        Behavior: Behavior normal.        Thought Content: Thought content normal.        Judgment: Judgment normal.    Depression screen [facility] 2/9 03/16/2020 02/21/2020 12/15/2019 08/01/2019 03/11/2019  Decreased Interest 0 0 0 0 0  Down, Depressed, Hopeless 0 0 0 0 0  PHQ - 2 Score 0 0 0 0 0    Functional Status Survey: Is the patient deaf or have difficulty hearing?: Yes Does the patient have difficulty seeing, even when wearing glasses/contacts?: No Does the patient have difficulty concentrating, remembering, or making decisions?: Yes Does the patient have difficulty walking or climbing stairs?: Yes Does the patient have difficulty dressing or bathing?: No Does the patient have difficulty doing errands alone such as visiting a doctor's office or shopping?: Yes  MMSE - [place] Exam 03/16/2020 03/11/2019 02/26/2018  Orientation to time 5 5 5   Orientation to Place 5 5 5   Registration 3 3 3   Attention/ Calculation 5 5 5   Recall 3 3 3   Language- name 2 objects 2 2 2   Language- repeat 1 1 1   Language- follow 3 step command 3 3 3   Language- read & follow direction 1 1 1   Write a sentence 1 1 1   Copy design 1 1 1   Total score 30 30 30     Fall Risk  03/16/2020 02/21/2020 12/15/2019 08/01/2019 03/11/2019  Falls in the past year? 0 1 0 0 0  Number falls in past yr: - 0 - - -  Injury with Fall? - 1 - - -  LABS: Recent Results (from the past 2160 hour(s))  Basic metabolic panel     Status: Abnormal   Collection Time: 02/07/20  8:09 PM  Result Value Ref Range   Sodium 127 (L) 135 - 145  mmol/L   Potassium 4.6 3.5 - 5.1 mmol/L   Chloride 92 (L) 98 - 111 mmol/L   CO2 26 22 - 32 mmol/L   Glucose, Bld 325 (H) 70 - 99 mg/dL    Comment: Glucose reference range applies only to samples taken after fasting for at least 8 hours.   BUN 19 8 - 23 mg/dL   Creatinine, Ser 1.31 (H) 0.61 - 1.24 mg/dL   Calcium 8.6 (L) 8.9 - 10.3 mg/dL   GFR calc non Af Amer 51 (L) >60 mL/min   GFR calc Af Amer 60 (L) >60 mL/min   Anion gap 9 5 - 15    Comment: Performed at [facility], [address] [place], [place] [address]  CBC     Status: Abnormal   Collection Time: 02/07/20  8:09 PM  Result Value Ref Range   WBC 11.9 (H) 4.0 - 10.5 K/uL   RBC 4.70 4.22 - 5.81 MIL/uL   Hemoglobin 13.1 13.0 - 17.0 g/dL   HCT 39.9 39 - 52 %   MCV 84.9 80.0 - 100.0 fL   MCH 27.9 26.0 - 34.0 pg   MCHC 32.8 30.0 - 36.0 g/dL   RDW 13.2 11.5 - 15.5 %   Platelets 205 150 - 400 K/uL   nRBC 0.0 0.0 - 0.2 %    Comment: Performed at [facility], [address] [place], [place] [address]  Urinalysis, Complete w Microscopic     Status: Abnormal   Collection Time: 02/07/20  8:09 PM  Result Value Ref Range   Color, Urine YELLOW (A) YELLOW   APPearance CLEAR (A) CLEAR   Specific Gravity, Urine 1.013 1.005 - 1.030   pH 5.0 5.0 - 8.0   Glucose, UA >=500 (A) NEGATIVE mg/dL   Hgb urine dipstick NEGATIVE NEGATIVE   Bilirubin Urine NEGATIVE NEGATIVE   Ketones, ur NEGATIVE NEGATIVE mg/dL   Protein, ur NEGATIVE NEGATIVE mg/dL   Nitrite NEGATIVE NEGATIVE   Leukocytes,Ua NEGATIVE NEGATIVE   RBC / HPF 0-5 0 - 5 RBC/hpf   WBC, UA 0-5 0 - 5 WBC/hpf   Bacteria, UA NONE SEEN NONE SEEN   Squamous Epithelial / LPF NONE SEEN 0 - 5    Comment: Performed at [facility], [address] [place], [place] [address]  Urinalysis, Routine w reflex microscopic     Status: Abnormal   Collection Time: 03/16/20 12:00 AM  Result Value Ref Range   Specific Gravity, UA      <=1.005 (A) 1.005 - 1.030   pH, UA  7.0 5.0 - 7.5   Color, UA Yellow Yellow   Appearance Ur Clear Clear   Leukocytes,UA Negative Negative   Protein,UA Negative Negative/Trace   Glucose, UA Negative Negative   Ketones, UA Negative Negative   RBC, UA Negative Negative   Bilirubin, UA Negative Negative   Urobilinogen, Ur 0.2 0.2 - 1.0 mg/dL   Nitrite, UA Negative Negative   Microscopic Examination Comment     Comment: Microscopic not indicated and not performed.    Assessment/Plan:  1. Encounter for general adult medical examination with abnormal findings Annual health maintenance exam today.   2. Uncontrolled type 2 diabetes mellitus with hyperglycemia (HCC) Blood sugars are up and down. He  should continue to see endocrinology as scheduled.   3. Coronary artery disease due to lipid rich plaque Stable. Continue regular visits with cardiology as scheduled.   4. Asthma, unspecified asthma severity, unspecified whether complicated, unspecified whether persistent Patient should continue to use nebulizer treatments as needed and as prescribed. An order for new home nebulizer was sent to DME provider.  - For home use only DME Nebulizer machine  5. SOB (shortness of breath) Patient should continue to use nebulizer treatments as needed and as prescribed. An order for new home nebulizer was sent to DME provider.   6. Essential hypertension Stable. Continue bp medication as prescribed  7. Dysuria - Urinalysis, Routine w reflex microscopic  General Counseling: Trell verbalizes understanding of the findings of todays visit and agrees with plan of treatment. I have discussed any further diagnostic evaluation that may be needed or ordered today. We also reviewed his medications today. he has been encouraged to call the office with any questions or concerns that should arise related to todays visit.    Counseling:  This patient was seen by Leretha Pol FNP Collaboration with Dr Lavera Guise as a part of collaborative  care agreement  Orders Placed This Encounter  Procedures  . For home use only DME Nebulizer machine  . Urinalysis, Routine w reflex microscopic      Total time spent: 45 Minutes  Time spent includes review of chart, medications, test results, and follow up plan with the patient.     Lavera Guise, MD  Internal Medicine

## 2020-03-17 LAB — URINALYSIS, ROUTINE W REFLEX MICROSCOPIC
Bilirubin, UA: NEGATIVE
Glucose, UA: NEGATIVE
Ketones, UA: NEGATIVE
Leukocytes,UA: NEGATIVE
Nitrite, UA: NEGATIVE
Protein,UA: NEGATIVE
RBC, UA: NEGATIVE
Specific Gravity, UA: <=1.005 — AB (ref 1.005–1.030)
Urobilinogen, Ur: 0.2 mg/dL (ref 0.2–1.0)
pH, UA: 7 (ref 5.0–7.5)

## 2020-03-27 DIAGNOSIS — G4733 Obstructive sleep apnea (adult) (pediatric): Secondary | ICD-10-CM | POA: Diagnosis not present

## 2020-03-27 DIAGNOSIS — E7800 Pure hypercholesterolemia, unspecified: Secondary | ICD-10-CM | POA: Diagnosis not present

## 2020-03-27 DIAGNOSIS — E1165 Type 2 diabetes mellitus with hyperglycemia: Secondary | ICD-10-CM | POA: Diagnosis not present

## 2020-03-27 DIAGNOSIS — E782 Mixed hyperlipidemia: Secondary | ICD-10-CM | POA: Diagnosis not present

## 2020-03-27 DIAGNOSIS — E1122 Type 2 diabetes mellitus with diabetic chronic kidney disease: Secondary | ICD-10-CM | POA: Diagnosis not present

## 2020-03-27 DIAGNOSIS — I10 Essential (primary) hypertension: Secondary | ICD-10-CM | POA: Diagnosis not present

## 2020-03-27 DIAGNOSIS — I2510 Atherosclerotic heart disease of native coronary artery without angina pectoris: Secondary | ICD-10-CM | POA: Diagnosis not present

## 2020-04-03 DIAGNOSIS — I2510 Atherosclerotic heart disease of native coronary artery without angina pectoris: Secondary | ICD-10-CM | POA: Diagnosis not present

## 2020-04-03 DIAGNOSIS — E7800 Pure hypercholesterolemia, unspecified: Secondary | ICD-10-CM | POA: Diagnosis not present

## 2020-04-03 DIAGNOSIS — E0842 Diabetes mellitus due to underlying condition with diabetic polyneuropathy: Secondary | ICD-10-CM | POA: Diagnosis not present

## 2020-04-03 DIAGNOSIS — E1165 Type 2 diabetes mellitus with hyperglycemia: Secondary | ICD-10-CM | POA: Diagnosis not present

## 2020-04-03 DIAGNOSIS — J4530 Mild persistent asthma, uncomplicated: Secondary | ICD-10-CM | POA: Diagnosis not present

## 2020-04-04 ENCOUNTER — Telehealth: Payer: Self-pay

## 2020-04-04 NOTE — Telephone Encounter (Signed)
Pt return nebulizer machine due to they don't want they have machine and also advised kelly that pt return its not open

## 2020-04-20 ENCOUNTER — Telehealth: Payer: Self-pay

## 2020-04-20 NOTE — Telephone Encounter (Signed)
Confirmed and screened for 04-24-20 ov. 

## 2020-04-24 ENCOUNTER — Encounter: Payer: Self-pay | Admitting: Internal Medicine

## 2020-04-24 ENCOUNTER — Ambulatory Visit (INDEPENDENT_AMBULATORY_CARE_PROVIDER_SITE_OTHER): Payer: Medicare Other | Admitting: Internal Medicine

## 2020-04-24 ENCOUNTER — Other Ambulatory Visit: Payer: Self-pay

## 2020-04-24 DIAGNOSIS — E782 Mixed hyperlipidemia: Secondary | ICD-10-CM

## 2020-04-24 DIAGNOSIS — Z8701 Personal history of pneumonia (recurrent): Secondary | ICD-10-CM | POA: Diagnosis not present

## 2020-04-24 DIAGNOSIS — E1165 Type 2 diabetes mellitus with hyperglycemia: Secondary | ICD-10-CM

## 2020-04-24 DIAGNOSIS — Z794 Long term (current) use of insulin: Secondary | ICD-10-CM

## 2020-04-24 DIAGNOSIS — M1711 Unilateral primary osteoarthritis, right knee: Secondary | ICD-10-CM | POA: Diagnosis not present

## 2020-04-24 DIAGNOSIS — Z9189 Other specified personal risk factors, not elsewhere classified: Secondary | ICD-10-CM

## 2020-04-24 NOTE — Progress Notes (Signed)
[facility] [place], [place] [address]  Internal MEDICINE  Office Visit Note  Patient Name: Daniel Hodges  [id number]  [id number]  Date of Service: 05/01/2020  Chief Complaint  Patient presents with   Follow-up   Diabetes    Pt see endocrinology    Hyperlipidemia   Hypertension   Quality Metric Gaps    Influenza Vaccine    HPI  Pt is here for routine follow up, feels well, needs podiatry app for new diabetic shoes. Has peripheral neuropathy. Blood sugars are better with less frequent episodes of hypoglycemia. Continues to have right knee pain off and on. BP is under good control    Current Medication: Outpatient Encounter Medications as of 04/24/2020  Medication Sig   amLODipine (NORVASC) 2.5 MG tablet TAKE 1 TABLET BY MOUTH  DAILY   aspirin 81 MG tablet Take 81 mg by mouth daily.   atorvastatin (LIPITOR) 20 MG tablet Take 1 tablet (20 mg total) by mouth at bedtime.   budesonide-formoterol (SYMBICORT) 160-4.5 MCG/ACT inhaler Inhale 2 puffs into the lungs 2 (two) times daily.    cholecalciferol (VITAMIN D3) 25 MCG (1000 UT) tablet Take 1,000 Units by mouth daily.   furosemide (LASIX) 40 MG tablet Take 0.5 tablets (20 mg total) by mouth daily.   gabapentin (NEURONTIN) 300 MG capsule TAKE 1 CAPSULE BY MOUTH DAILY AND 1 CAPSULE NIGHTLY   HUMALOG KWIKPEN 100 UNIT/ML KwikPen Inject 6-10 Units into the skin See admin instructions. Inject 6u under the skin daily at breakfast-time, 8u at lunch-time and inject 10u under the skin daily at dinner-time   hydrocortisone 1 % ointment Apply small amount mix with lotion at night for leg rash   ipratropium-albuterol (DUONEB) 0.5-2.5 (3) MG/3ML SOLN USE 3 ML VIA NEBULIZER EVERY 6 HOURS AS NEEDED   loratadine (CLARITIN) 10 MG tablet Take 1 tablet (10 mg total) by mouth daily.   Magnesium 250 MG TABS Take 250 mg by mouth 2 (two) times daily.   mometasone (NASONEX) 50 MCG/ACT nasal spray Place 2  sprays into the nose daily.   montelukast (SINGULAIR) 10 MG tablet Take 1 tablet (10 mg total) by mouth daily.   Multiple Vitamin (MULTIVITAMIN WITH MINERALS) TABS tablet Take 1 tablet by mouth daily.   ONE TOUCH ULTRA TEST test strip USE THREE TIMES DAILY   senna (SENOKOT) 8.6 MG tablet Take 1 tablet (8.6 mg total) by mouth daily.   simethicone (MYLICON) 80 MG chewable tablet Chew 2 tablets (160 mg total) by mouth 2 (two) times daily.   TOUJEO SOLOSTAR 300 UNIT/ML SOPN Inject 25 Units into the skin at bedtime.    vitamin B-12 (CYANOCOBALAMIN) 1000 MCG tablet Take 1,000 mcg by mouth daily.   No facility-administered encounter medications on file as of 04/24/2020.    Surgical History: Past Surgical History:  Procedure Laterality Date   APPENDECTOMY     COLONOSCOPY WITH PROPOFOL N/A 06/11/2015   Procedure: COLONOSCOPY WITH PROPOFOL;  Surgeon: Manya Silvas, MD;  Location: [facility] ENDOSCOPY;  Service: Endoscopy;  Laterality: N/A;   CORONARY ARTERY BYPASS GRAFT     HERNIA REPAIR     TEE WITHOUT CARDIOVERSION     TRACHEOSTOMY     VASCULAR SURGERY      Medical History: Past Medical History:  Diagnosis Date   Anginal pain ([place])    Asthma    Coronary artery disease    Diabetes mellitus without complication ([place])    Hyperlipidemia    Hypertension  Sleep apnea     Family History: Family History  Problem Relation Age of Onset   Cancer Sister    Diabetes Daughter    Diabetes Son     Social History   Socioeconomic History   Marital status: Married    Spouse name: Not on file   Number of children: Not on file   Years of education: Not on file   Highest education level: Not on file  Occupational History   Not on file  Tobacco Use   Smoking status: Never Smoker   Smokeless tobacco: Never Used  Vaping Use   Vaping Use: Never used  Substance and Sexual Activity   Alcohol use: No   Drug use: No   Sexual activity: Not on file  Other  Topics Concern   Not on file  Social History Narrative   Not on file   Social Determinants of Health   Financial Resource Strain:    Difficulty of Paying Living Expenses:   Food Insecurity:    Worried About Charity fundraiser in the Last Year:    Arboriculturist in the Last Year:   Transportation Needs:    Film/video editor (Medical):    Lack of Transportation (Non-Medical):   Physical Activity:    Days of Exercise per Week:    Minutes of Exercise per Session:   Stress:    Feeling of Stress :   Social Connections:    Frequency of Communication with Friends and Family:    Frequency of Social Gatherings with Friends and Family:    Attends Religious Services:    Active Member of Clubs or Organizations:    Attends Music therapist:    Marital Status:   Intimate Partner Violence:    Fear of Current or Ex-Partner:    Emotionally Abused:    Physically Abused:    Sexually Abused:       Review of Systems  Constitutional: Negative for chills, fatigue and unexpected weight change.  HENT: Positive for postnasal drip. Negative for congestion, rhinorrhea, sneezing and sore throat.   Eyes: Negative for redness.  Respiratory: Negative for cough, chest tightness and shortness of breath.   Cardiovascular: Negative for chest pain and palpitations.  Gastrointestinal: Negative for abdominal pain, constipation, diarrhea, nausea and vomiting.  Genitourinary: Negative for dysuria and frequency.  Musculoskeletal: Negative for arthralgias, back pain, joint swelling and neck pain.  Skin: Negative for rash.  Neurological: Negative.  Negative for tremors and numbness.  Hematological: Negative for adenopathy. Does not bruise/bleed easily.  Psychiatric/Behavioral: Negative for behavioral problems (Depression), sleep disturbance and suicidal ideas. The patient is not nervous/anxious.     Vital Signs: BP (!) 141/73    Pulse 80    Temp 97.8 F (36.6 C)     Resp 16    Ht 5\' 6"  (1.676 m)    Wt 166 lb 9.6 oz (75.6 kg)    SpO2 97%    BMI 26.89 kg/m    Physical Exam Constitutional:      Appearance: Normal appearance.  HENT:     Head: Normocephalic and atraumatic.     Mouth/Throat:     Mouth: Mucous membranes are dry.  Cardiovascular:     Rate and Rhythm: Regular rhythm.  Pulmonary:     Effort: Pulmonary effort is normal.     Breath sounds: Normal breath sounds.  Skin:    General: Skin is warm and dry.  Neurological:     Mental Status:  He is alert.     Assessment/Plan: 1. Type 2 diabetes mellitus with hyperglycemia, with long-term current use of insulin (HCC) - Pt to continue to follow up endocrinology  - Basic Metabolic Panel (BMET) - Ambulatory referral to Podiatry  2. Mixed hyperlipidemia - Controlled   3. History of pneumonia as indication for 23-polyvalent pneumococcal polysaccharide vaccine - Pneumococcal polysaccharide vaccine 23-valent greater than or equal to 2yo subcutaneous/IM  4. Primary osteoarthritis of right knee - Continue care with ortho   General Counseling: Hawley verbalizes understanding of the findings of todays visit and agrees with plan of treatment. I have discussed any further diagnostic evaluation that may be needed or ordered today. We also reviewed his medications today. he has been encouraged to call the office with any questions or concerns that should arise related to todays visit. Orders Placed This Encounter  Procedures   Basic Metabolic Panel (BMET)   Ambulatory referral to Podiatry    Total time spent: 35 Minutes Time spent includes review of chart, medications, test results, and follow up plan with the patient.   Dr Lavera Guise Internal medicine

## 2020-04-25 LAB — BASIC METABOLIC PANEL
BUN/Creatinine Ratio: 11 (ref 10–24)
BUN: 12 mg/dL (ref 8–27)
CO2: 23 mmol/L (ref 20–29)
Calcium: 9.1 mg/dL (ref 8.6–10.2)
Chloride: 96 mmol/L (ref 96–106)
Creatinine, Ser: 1.12 mg/dL (ref 0.76–1.27)
GFR calc Af Amer: 72 mL/min/{1.73_m2} (ref >59)
GFR calc non Af Amer: 62 mL/min/{1.73_m2} (ref >59)
Glucose: 129 mg/dL — ABNORMAL HIGH (ref 65–99)
Potassium: 4.8 mmol/L (ref 3.5–5.2)
Sodium: 136 mmol/L (ref 134–144)

## 2020-05-08 ENCOUNTER — Other Ambulatory Visit: Payer: Self-pay | Admitting: Adult Health

## 2020-05-15 DIAGNOSIS — E1165 Type 2 diabetes mellitus with hyperglycemia: Secondary | ICD-10-CM | POA: Diagnosis not present

## 2020-05-15 DIAGNOSIS — I10 Essential (primary) hypertension: Secondary | ICD-10-CM | POA: Diagnosis not present

## 2020-05-15 DIAGNOSIS — J4530 Mild persistent asthma, uncomplicated: Secondary | ICD-10-CM | POA: Diagnosis not present

## 2020-05-15 DIAGNOSIS — E113499 Type 2 diabetes mellitus with severe nonproliferative diabetic retinopathy without macular edema, unspecified eye: Secondary | ICD-10-CM | POA: Diagnosis not present

## 2020-05-16 DIAGNOSIS — B351 Tinea unguium: Secondary | ICD-10-CM | POA: Diagnosis not present

## 2020-05-16 DIAGNOSIS — E119 Type 2 diabetes mellitus without complications: Secondary | ICD-10-CM | POA: Diagnosis not present

## 2020-05-16 DIAGNOSIS — M2041 Other hammer toe(s) (acquired), right foot: Secondary | ICD-10-CM | POA: Diagnosis not present

## 2020-05-16 DIAGNOSIS — M2042 Other hammer toe(s) (acquired), left foot: Secondary | ICD-10-CM | POA: Diagnosis not present

## 2020-06-02 IMAGING — CT CT CERVICAL SPINE W/O CM
3 of 4 series · 13 of 33 positions shown, 16 images · non-contrast
Comparison: None.

CLINICAL DATA: Syncopal episode, fall

EXAM:
CT HEAD WITHOUT CONTRAST
CT CERVICAL SPINE WITHOUT CONTRAST
TECHNIQUE: Multidetector CT imaging of the head and cervical spine was
performed following the standard protocol without intravenous
contrast. Multiplanar CT image reconstructions of the cervical spine
were also generated.

[Series 4: sagittal bone · sagittal · 0.24mm/px · 5 of 43 slices shown, 6 images]
[im 15/43  bone]
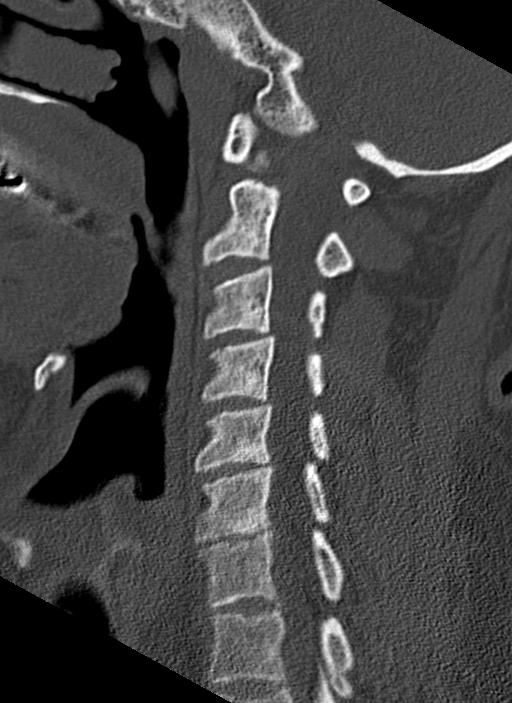
[im 18/43  bone]
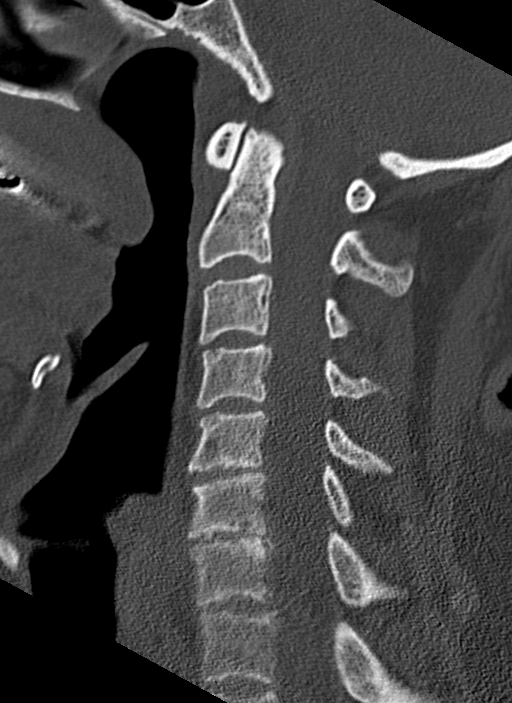
[im 22/43  soft-tissue]
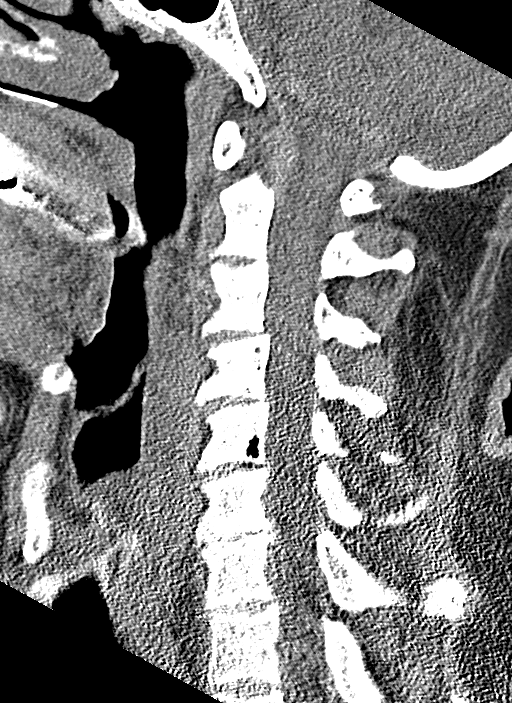
[im 22/43  bone]
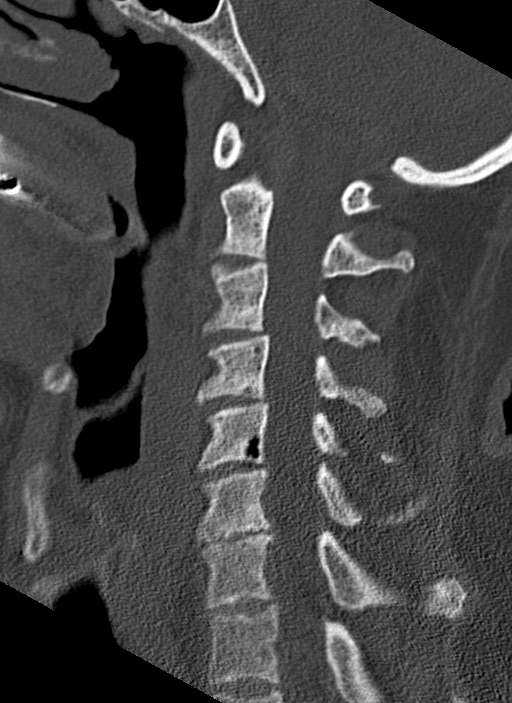
[im 25/43  bone]
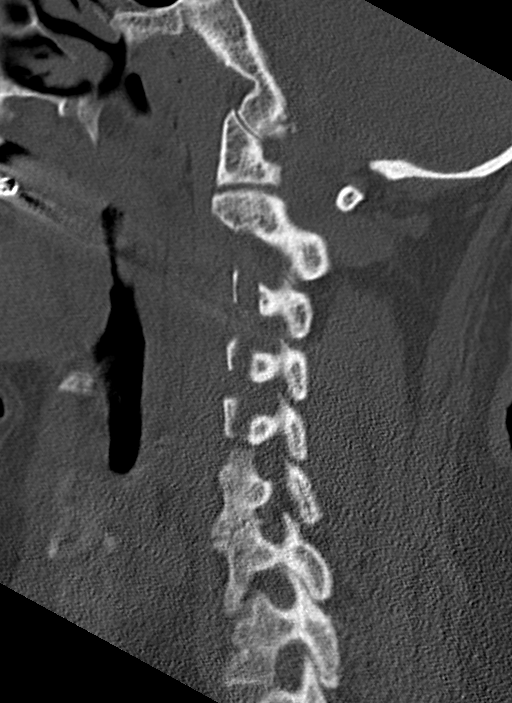
[im 29/43  bone]
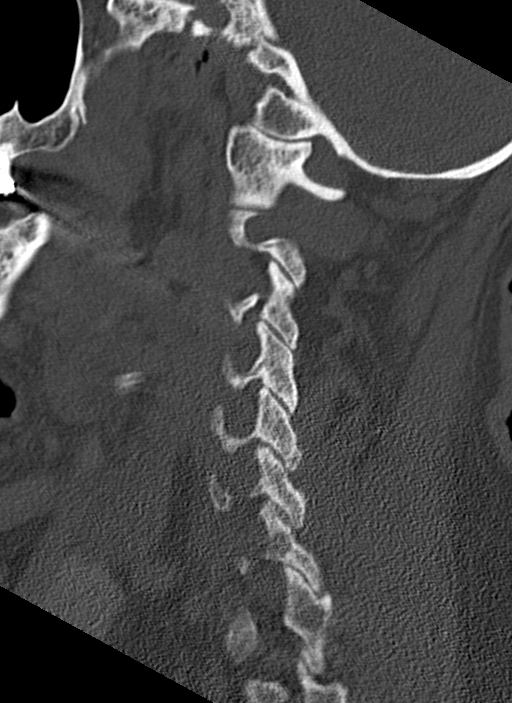

[Series 5: coronal bone · coronal · 0.24mm/px · 3 of 40 slices shown]
[im 8/40  bone]
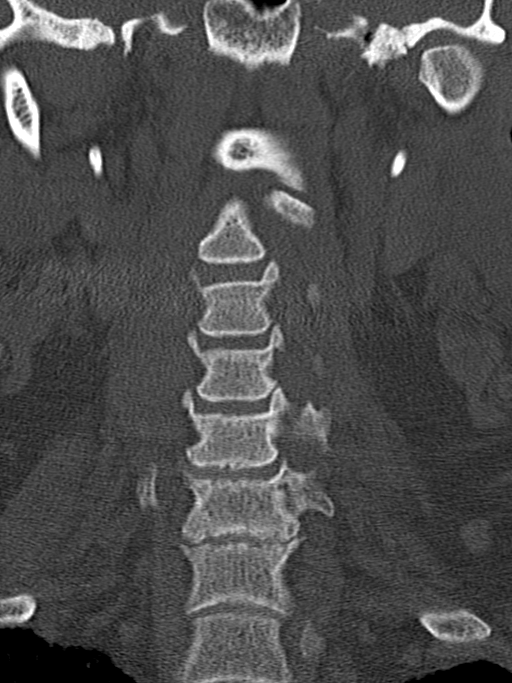
[im 16/40  bone]
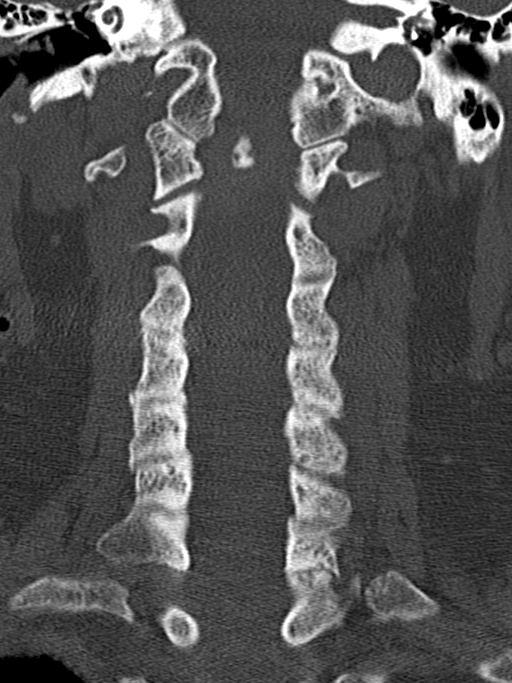
[im 24/40  bone]
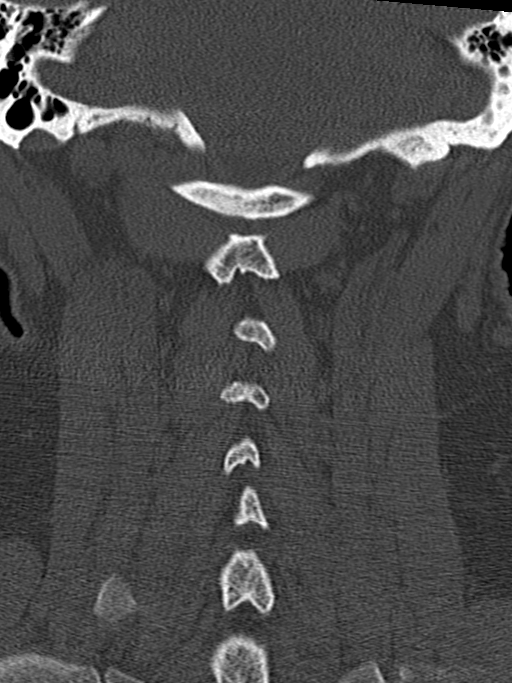

[Series 6: orthogonal bone · axial · 0.23mm/px · z∈[-279,-184]mm · 5 of 81 slices shown, 7 images]
[im 14/81  soft-tissue]
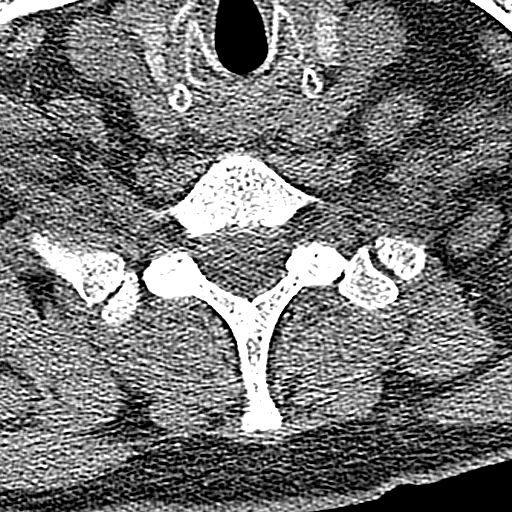
[im 14/81  bone]
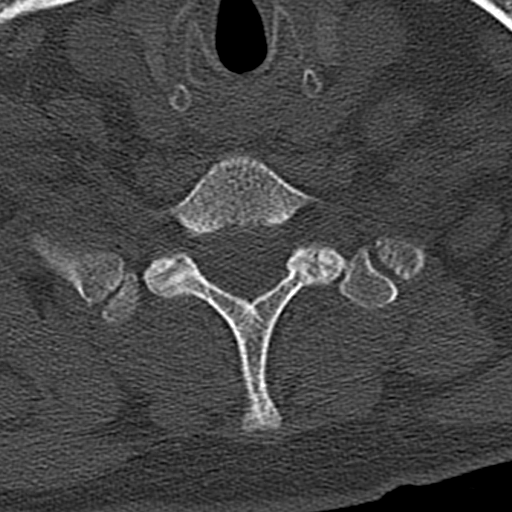
[im 27/81  bone]
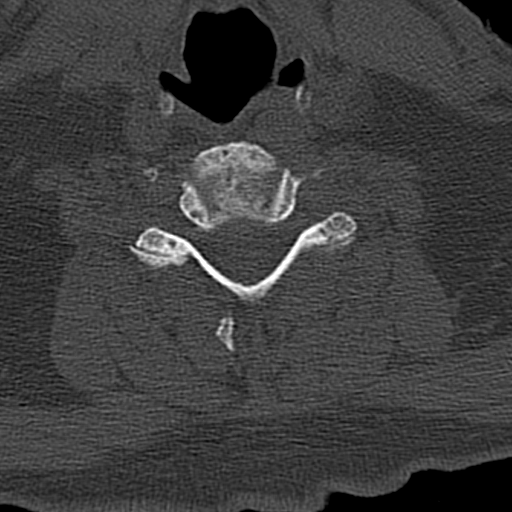
[im 41/81  bone]
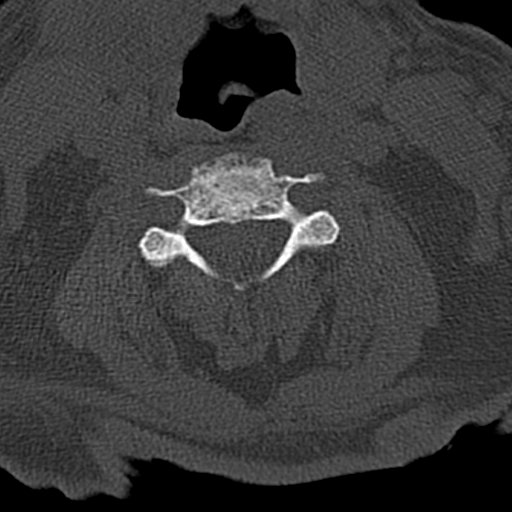
[im 54/81  bone]
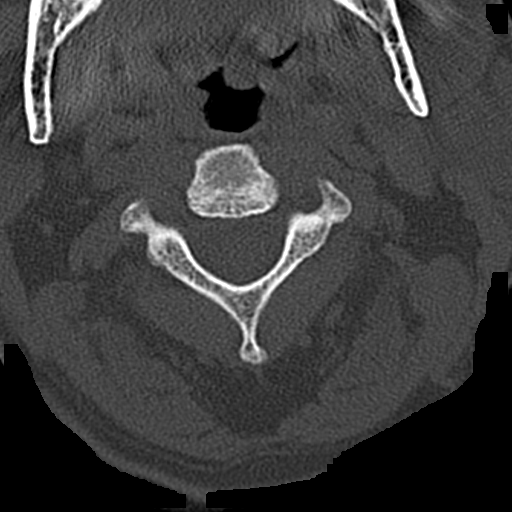
[im 67/81  soft-tissue]
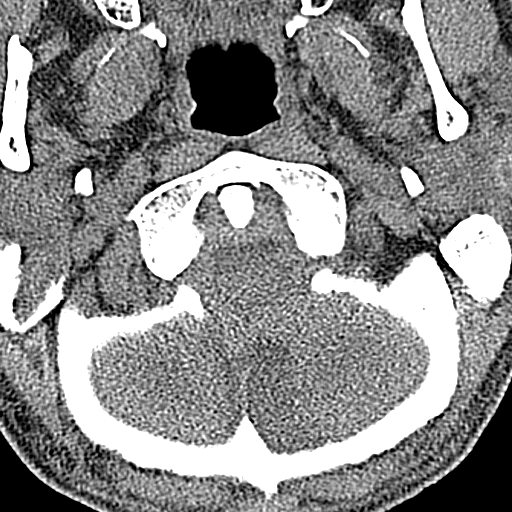
[im 67/81  bone]
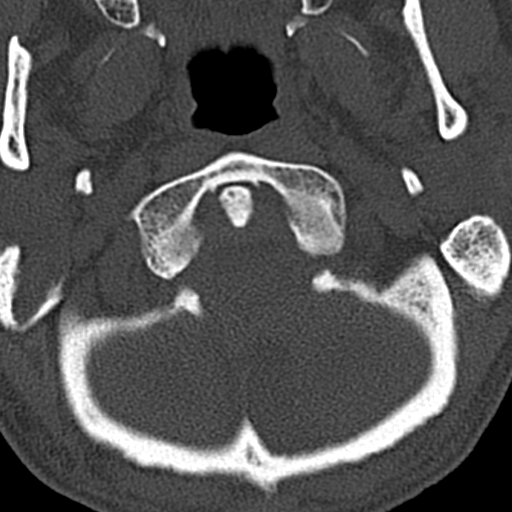

[13 of 33 positions shown; findings below may reference images not displayed]

FINDINGS: CT HEAD FINDINGS

Brain: There is no acute intracranial hemorrhage, mass effect, or
edema. Gray-white differentiation is preserved. There is no
extra-axial fluid collection. Prominence of the ventricles and sulci
reflects mild generalized parenchymal volume loss. Minimal patchy
hypoattenuation in the supratentorial white matter is nonspecific
but may reflect minor chronic microvascular ischemic changes.

Vascular: There is mild atherosclerotic calcification at the skull
base.

Skull: Calvarium is unremarkable.

Sinuses/Orbits: No acute finding.

Other: None.

CT CERVICAL SPINE FINDINGS

Alignment: Anteroposterior alignment is preserved.

Skull base and vertebrae: There is no acute cervical spine fracture.
Vertebral body heights are maintained apart from mild degenerative
endplate irregularity.

Soft tissues and spinal canal: No prevertebral fluid or swelling. No
visible canal hematoma.

Disc levels: Multilevel degenerative changes are present without
high-grade osseous encroachment the spinal canal or neural foramina.

Upper chest: Negative.

Other: None
IMPRESSION: No evidence of acute intracranial injury. No acute cervical spine
fracture.

## 2020-06-02 IMAGING — CT CT HEAD W/O CM
4 series · 15 of 47 positions shown, 17 images · non-contrast
Comparison: None.

CLINICAL DATA: Syncopal episode, fall

EXAM:
CT HEAD WITHOUT CONTRAST
CT CERVICAL SPINE WITHOUT CONTRAST
TECHNIQUE: Multidetector CT imaging of the head and cervical spine was
performed following the standard protocol without intravenous
contrast. Multiplanar CT image reconstructions of the cervical spine
were also generated.

[Series 2: head wo · axial · 0.42mm/px · z∈[-125,-25]mm · 7 of 28 slices shown, 9 images]
[im 4/28  brain]
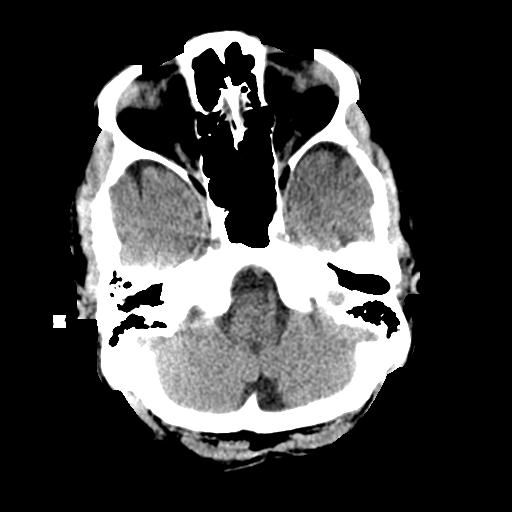
[im 4/28  bone]
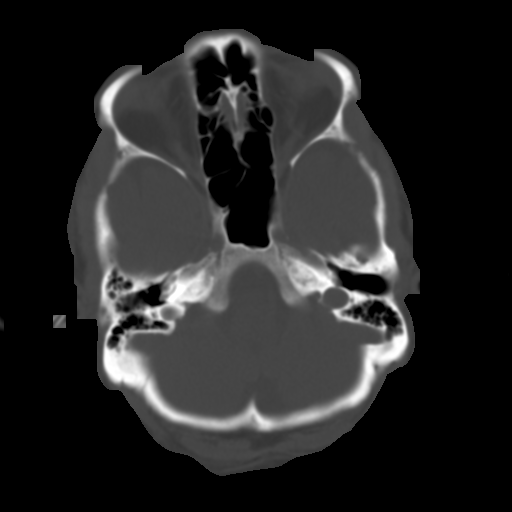
[im 7/28  brain]
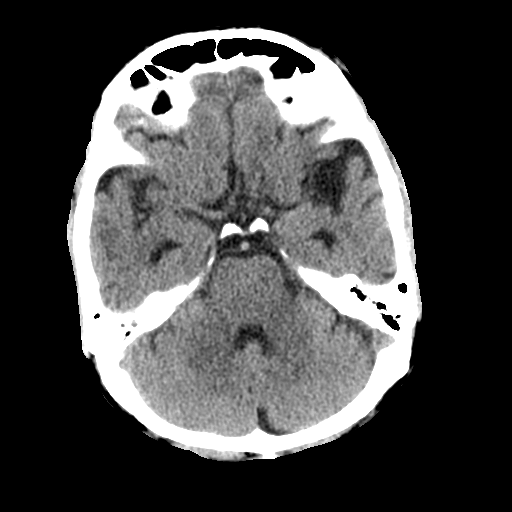
[im 11/28  brain]
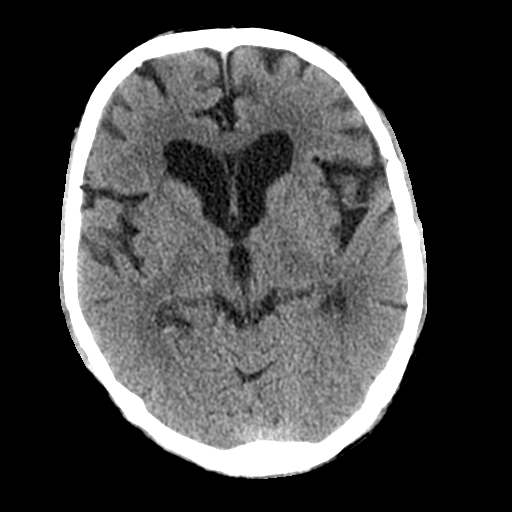
[im 14/28  brain]
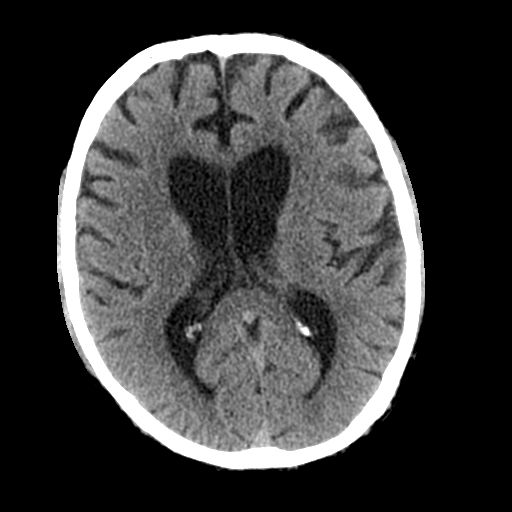
[im 17/28  brain]
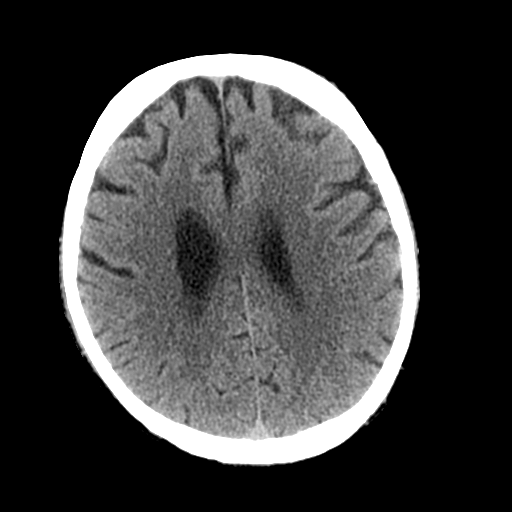
[im 17/28  bone]
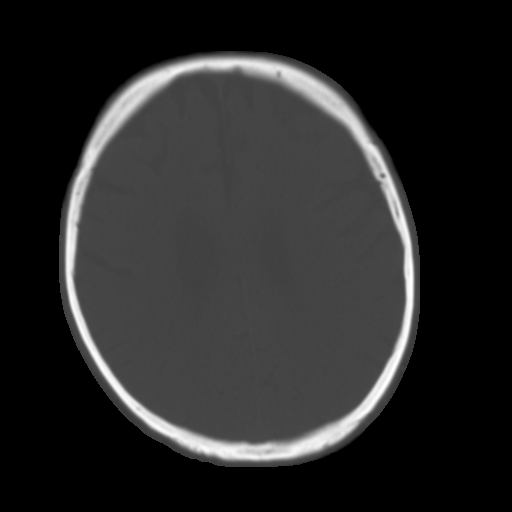
[im 21/28  brain]
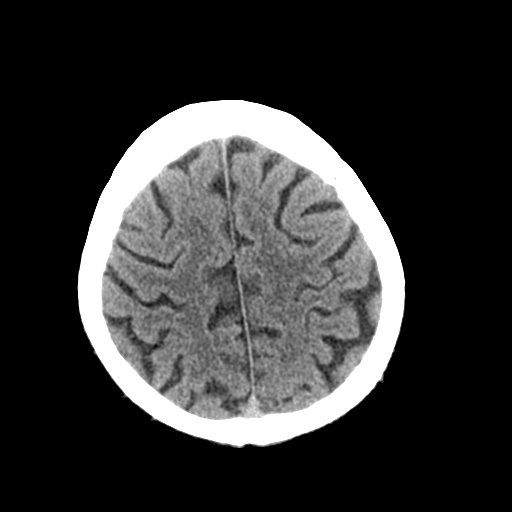
[im 24/28  brain]
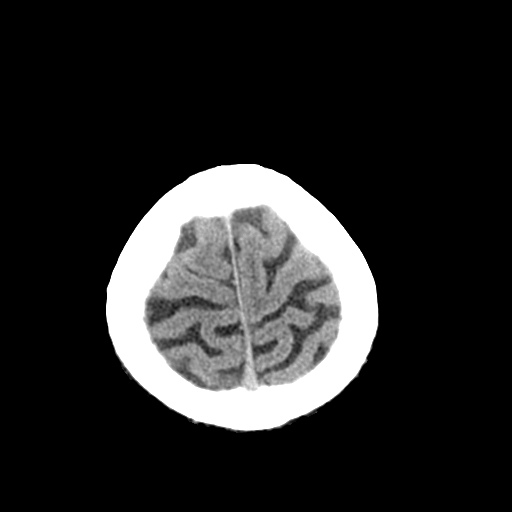

[Series 3: head bone · axial · 0.42mm/px · z∈[-128,-114]mm · 2 of 68 slices shown]
[im 7/68  bone]
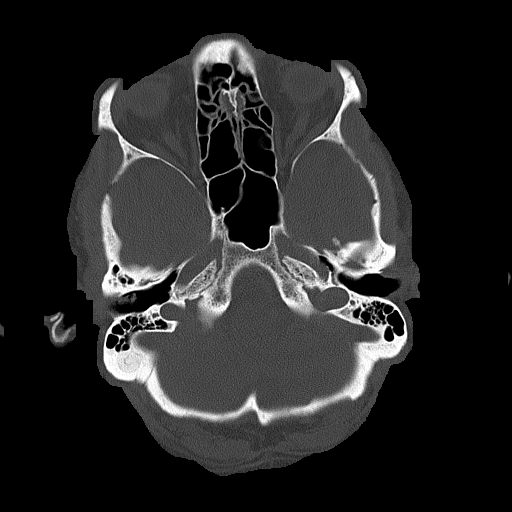
[im 14/68  bone]
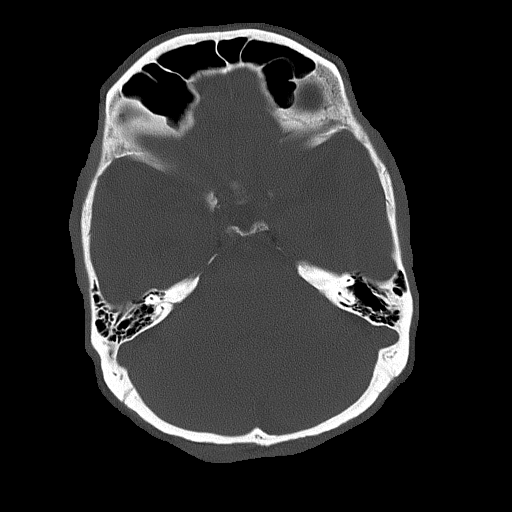

[Series 4: coronal soft tissue · coronal · 0.31mm/px · 3 of 64 slices shown]
[im 22/64  brain]
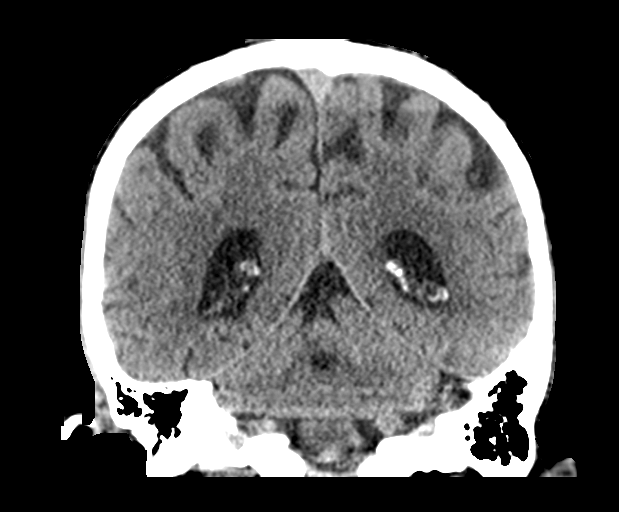
[im 29/64  brain]
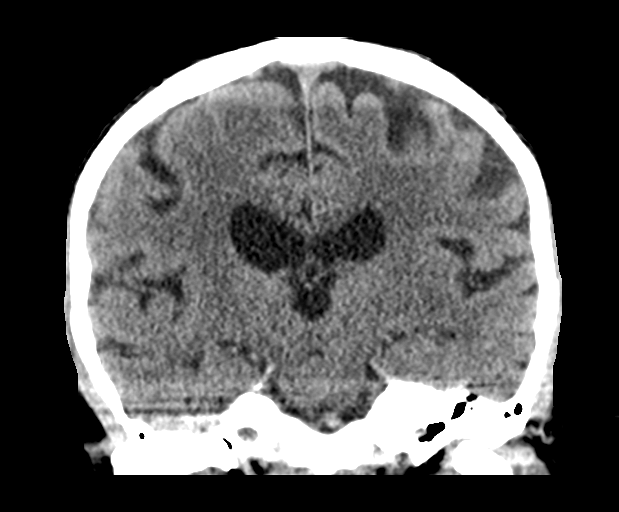
[im 36/64  brain]
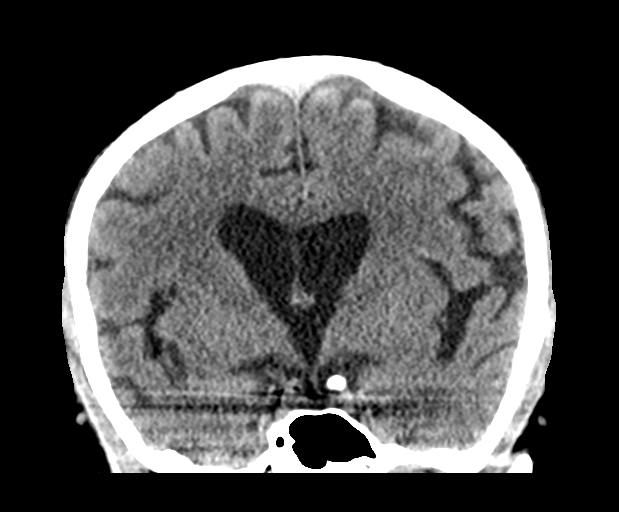

[Series 5: sagittal soft tissue · sagittal · 0.29mm/px · 3 of 55 slices shown]
[im 19/55  brain]
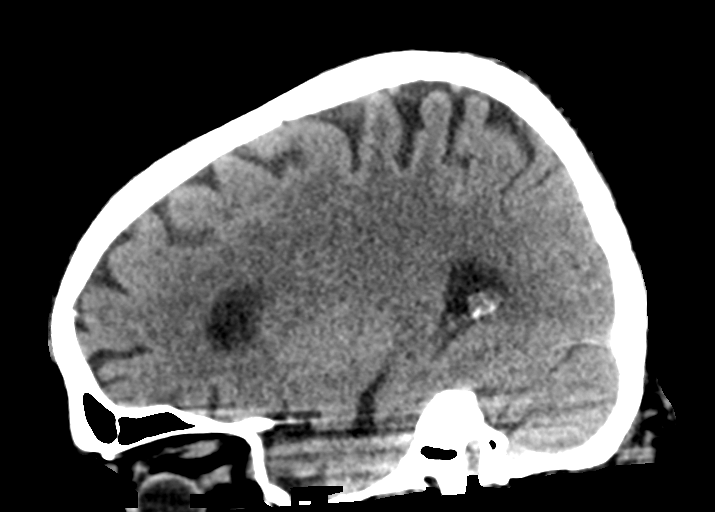
[im 28/55  brain]
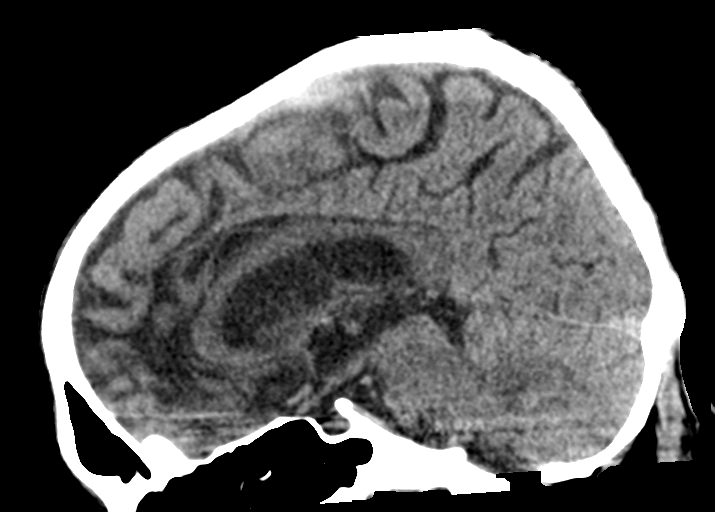
[im 37/55  brain]
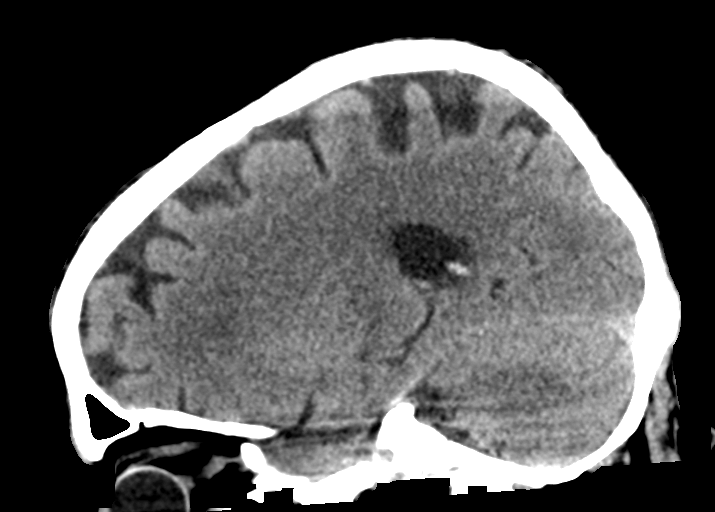

[15 of 47 positions shown; findings below may reference images not displayed]

FINDINGS: CT HEAD FINDINGS

Brain: There is no acute intracranial hemorrhage, mass effect, or
edema. Gray-white differentiation is preserved. There is no
extra-axial fluid collection. Prominence of the ventricles and sulci
reflects mild generalized parenchymal volume loss. Minimal patchy
hypoattenuation in the supratentorial white matter is nonspecific
but may reflect minor chronic microvascular ischemic changes.

Vascular: There is mild atherosclerotic calcification at the skull
base.

Skull: Calvarium is unremarkable.

Sinuses/Orbits: No acute finding.

Other: None.

CT CERVICAL SPINE FINDINGS

Alignment: Anteroposterior alignment is preserved.

Skull base and vertebrae: There is no acute cervical spine fracture.
Vertebral body heights are maintained apart from mild degenerative
endplate irregularity.

Soft tissues and spinal canal: No prevertebral fluid or swelling. No
visible canal hematoma.

Disc levels: Multilevel degenerative changes are present without
high-grade osseous encroachment the spinal canal or neural foramina.

Upper chest: Negative.

Other: None
IMPRESSION: No evidence of acute intracranial injury. No acute cervical spine
fracture.

## 2020-06-12 ENCOUNTER — Other Ambulatory Visit: Payer: Self-pay | Admitting: Adult Health

## 2020-06-29 DIAGNOSIS — E113293 Type 2 diabetes mellitus with mild nonproliferative diabetic retinopathy without macular edema, bilateral: Secondary | ICD-10-CM | POA: Diagnosis not present

## 2020-07-03 DIAGNOSIS — E1165 Type 2 diabetes mellitus with hyperglycemia: Secondary | ICD-10-CM | POA: Diagnosis not present

## 2020-07-10 DIAGNOSIS — I10 Essential (primary) hypertension: Secondary | ICD-10-CM | POA: Diagnosis not present

## 2020-07-10 DIAGNOSIS — G4730 Sleep apnea, unspecified: Secondary | ICD-10-CM | POA: Diagnosis not present

## 2020-07-10 DIAGNOSIS — E1165 Type 2 diabetes mellitus with hyperglycemia: Secondary | ICD-10-CM | POA: Diagnosis not present

## 2020-07-10 DIAGNOSIS — E7800 Pure hypercholesterolemia, unspecified: Secondary | ICD-10-CM | POA: Diagnosis not present

## 2020-07-10 DIAGNOSIS — I2510 Atherosclerotic heart disease of native coronary artery without angina pectoris: Secondary | ICD-10-CM | POA: Diagnosis not present

## 2020-07-10 DIAGNOSIS — E083499 Diabetes mellitus due to underlying condition with severe nonproliferative diabetic retinopathy without macular edema, unspecified eye: Secondary | ICD-10-CM | POA: Diagnosis not present

## 2020-07-25 ENCOUNTER — Ambulatory Visit (INDEPENDENT_AMBULATORY_CARE_PROVIDER_SITE_OTHER): Payer: Medicare Other | Admitting: Hospice and Palliative Medicine

## 2020-07-25 ENCOUNTER — Encounter: Payer: Self-pay | Admitting: Hospice and Palliative Medicine

## 2020-07-25 ENCOUNTER — Other Ambulatory Visit: Payer: Self-pay

## 2020-07-25 DIAGNOSIS — Z9989 Dependence on other enabling machines and devices: Secondary | ICD-10-CM

## 2020-07-25 DIAGNOSIS — Z794 Long term (current) use of insulin: Secondary | ICD-10-CM

## 2020-07-25 DIAGNOSIS — E1165 Type 2 diabetes mellitus with hyperglycemia: Secondary | ICD-10-CM | POA: Diagnosis not present

## 2020-07-25 DIAGNOSIS — G4733 Obstructive sleep apnea (adult) (pediatric): Secondary | ICD-10-CM | POA: Diagnosis not present

## 2020-07-25 DIAGNOSIS — I10 Essential (primary) hypertension: Secondary | ICD-10-CM

## 2020-07-25 DIAGNOSIS — Z23 Encounter for immunization: Secondary | ICD-10-CM | POA: Diagnosis not present

## 2020-07-25 DIAGNOSIS — J0140 Acute pansinusitis, unspecified: Secondary | ICD-10-CM | POA: Diagnosis not present

## 2020-07-25 NOTE — Progress Notes (Signed)
[facility] [place], [place] [address]  Internal MEDICINE  Office Visit Note  Patient Name: Daniel Hodges  [id number]  [id number]  Date of Service: 07/27/2020  Chief Complaint  Patient presents with  . Follow-up    3 month  . Hypertension    pt's wife concerned about his bp meds not being enough to help  . Cough    for 2 weeks  . Cpap    never got machine and sleep study   . controlled substance policy    acknowledged    HPI Patient is here for routine follow-up Wife is present in room--brings recording of glucose as well as BP levels from home Followed by endocrinology for DM--last A1C 7.7 last month, home glucose readings stable BP readings from home range 120-140/70-80  Wife mentions he never received CPAP--misunderstood, they have been contacted but due to financial concerns did not proceed with picking up machine  Complains today of having chest congestion and dry cough for about 2 weeks, denies shortness of breath or fevers  Current Medication: Outpatient Encounter Medications as of 07/25/2020  Medication Sig  . amLODipine (NORVASC) 2.5 MG tablet TAKE 1 TABLET BY MOUTH  DAILY  . aspirin 81 MG tablet Take 81 mg by mouth daily.  Marland Kitchen atorvastatin (LIPITOR) 20 MG tablet TAKE 1 TABLET BY MOUTH AT  BEDTIME  . budesonide-formoterol (SYMBICORT) 160-4.5 MCG/ACT inhaler Inhale 2 puffs into the lungs 2 (two) times daily.   . cholecalciferol (VITAMIN D3) 25 MCG (1000 UT) tablet Take 1,000 Units by mouth daily.  . furosemide (LASIX) 40 MG tablet Take 0.5 tablets (20 mg total) by mouth daily.  Marland Kitchen gabapentin (NEURONTIN) 300 MG capsule TAKE 1 CAPSULE BY MOUTH  DAILY AND 1 CAPSULE BY  MOUTH AT NIGHT  . HUMALOG KWIKPEN 100 UNIT/ML KwikPen Inject 6-10 Units into the skin See admin instructions. Inject 6u under the skin daily at breakfast-time, 8u at lunch-time and inject 10u under the skin daily at dinner-time  . hydrocortisone 1 % ointment Apply small  amount mix with lotion at night for leg rash  . ipratropium-albuterol (DUONEB) 0.5-2.5 (3) MG/3ML SOLN USE 3 ML VIA NEBULIZER EVERY 6 HOURS AS NEEDED  . loratadine (CLARITIN) 10 MG tablet Take 1 tablet (10 mg total) by mouth daily.  . Magnesium 250 MG TABS Take 250 mg by mouth 2 (two) times daily.  . mometasone (NASONEX) 50 MCG/ACT nasal spray Place 2 sprays into the nose daily.  . montelukast (SINGULAIR) 10 MG tablet Take 1 tablet (10 mg total) by mouth daily.  . Multiple Vitamin (MULTIVITAMIN WITH MINERALS) TABS tablet Take 1 tablet by mouth daily.  . ONE TOUCH ULTRA TEST test strip USE THREE TIMES DAILY  . senna (SENOKOT) 8.6 MG tablet Take 1 tablet (8.6 mg total) by mouth daily.  . simethicone (MYLICON) 80 MG chewable tablet Chew 2 tablets (160 mg total) by mouth 2 (two) times daily.  [name] SOLOSTAR 300 UNIT/ML SOPN Inject 25 Units into the skin at bedtime.   . vitamin B-12 (CYANOCOBALAMIN) 1000 MCG tablet Take 1,000 mcg by mouth daily.   No facility-administered encounter medications on file as of 07/25/2020.    Surgical History: Past Surgical History:  Procedure Laterality Date  . APPENDECTOMY    . COLONOSCOPY WITH PROPOFOL N/A 06/11/2015   Procedure: COLONOSCOPY WITH PROPOFOL;  Surgeon: Manya Silvas, MD;  Location: [facility] ENDOSCOPY;  Service: Endoscopy;  Laterality: N/A;  . CORONARY ARTERY BYPASS GRAFT    .  HERNIA REPAIR    . TEE WITHOUT CARDIOVERSION    . TRACHEOSTOMY    . VASCULAR SURGERY      Medical History: Past Medical History:  Diagnosis Date  . Anginal pain ([place])   . Asthma   . Coronary artery disease   . Diabetes mellitus without complication ([place])   . Hyperlipidemia   . Hypertension   . Sleep apnea     Family History: Family History  Problem Relation Age of Onset  . Cancer Sister   . Diabetes Daughter   . Diabetes Son     Social History   Socioeconomic History  . Marital status: Married    Spouse name: Not on file  . Number of children: Not  on file  . Years of education: Not on file  . Highest education level: Not on file  Occupational History  . Not on file  Tobacco Use  . Smoking status: Never Smoker  . Smokeless tobacco: Never Used  Vaping Use  . Vaping Use: Never used  Substance and Sexual Activity  . Alcohol use: No  . Drug use: No  . Sexual activity: Not on file  Other Topics Concern  . Not on file  Social History Narrative  . Not on file   Social Determinants of Health   Financial Resource Strain:   . Difficulty of Paying Living Expenses: Not on file  Food Insecurity:   . Worried About Charity fundraiser in the Last Year: Not on file  . Ran Out of Food in the Last Year: Not on file  Transportation Needs:   . Lack of Transportation (Medical): Not on file  . Lack of Transportation (Non-Medical): Not on file  Physical Activity:   . Days of Exercise per Week: Not on file  . Minutes of Exercise per Session: Not on file  Stress:   . Feeling of Stress : Not on file  Social Connections:   . Frequency of Communication with Friends and Family: Not on file  . Frequency of Social Gatherings with Friends and Family: Not on file  . Attends Religious Services: Not on file  . Active Member of Clubs or Organizations: Not on file  . Attends Archivist Meetings: Not on file  . Marital Status: Not on file  Intimate Partner Violence:   . Fear of Current or Ex-Partner: Not on file  . Emotionally Abused: Not on file  . Physically Abused: Not on file  . Sexually Abused: Not on file   Review of Systems  Constitutional: Negative for chills, fatigue and unexpected weight change.  HENT: Positive for congestion. Negative for postnasal drip, rhinorrhea, sinus pressure, sneezing and sore throat.   Eyes: Negative for photophobia, redness and visual disturbance.  Respiratory: Negative for cough, chest tightness and shortness of breath.   Cardiovascular: Negative for chest pain, palpitations and leg swelling.   Gastrointestinal: Negative for abdominal pain, constipation, diarrhea, nausea and vomiting.  Genitourinary: Negative for dysuria and frequency.  Musculoskeletal: Negative for arthralgias, back pain, joint swelling and neck pain.  Skin: Negative for rash.  Neurological: Negative for tremors and numbness.  Hematological: Negative for adenopathy. Does not bruise/bleed easily.  Psychiatric/Behavioral: Negative for behavioral problems (Depression), sleep disturbance and suicidal ideas. The patient is not nervous/anxious.     Vital Signs: BP 136/74   Pulse 83   Temp 98.3 F (36.8 C)   Resp 16   Ht 5\' 6"  (1.676 m)   Wt 168 lb 9.6 oz (76.5  kg)   SpO2 96%   BMI 27.21 kg/m    Physical Exam Vitals reviewed.  Constitutional:      Appearance: Normal appearance.  HENT:     Right Ear: Tympanic membrane normal.     Left Ear: Tympanic membrane normal.     Nose: Nose normal.     Mouth/Throat:     Mouth: Mucous membranes are moist.  Cardiovascular:     Rate and Rhythm: Normal rate and regular rhythm.     Pulses: Normal pulses.     Heart sounds: Normal heart sounds.  Pulmonary:     Effort: Pulmonary effort is normal.     Breath sounds: Normal breath sounds.  Musculoskeletal:        General: Normal range of motion.     Cervical back: Normal range of motion.  Skin:    General: Skin is warm.  Neurological:     General: No focal deficit present.     Mental Status: He is alert and oriented to person, place, and time. Mental status is at baseline.  Psychiatric:        Mood and Affect: Mood normal.        Behavior: Behavior normal.        Thought Content: Thought content normal.    Assessment/Plan: 1. Type 2 diabetes mellitus with hyperglycemia, with long-term current use of insulin ([place]) Managed and followed by endocrinology-last A1C 7.7--no documented hypoglycemic events from home monitoring CMP reviewed--stable Has appt to see podiatry--encouraged to discuss diabetic shoes at  appt  2. Essential hypertension BP and HR stable today--also home readings stable, discussed normal readings with wife 140/80  3. OSA on CPAP Continue to use current machine--will need to further discuss financial aspect of new machine with company  4. Acute non-recurrent pansinusitis Pt is feeling better--would like to try samples of Mucinex, gave in office today Encouraged to increase fluid intake while taking Mucinex  5. Flu vaccine need - Flu Vaccine MDCK QUAD PF  General Counseling: Daniel Hodges verbalizes understanding of the findings of todays visit and agrees with plan of treatment. I have discussed any further diagnostic evaluation that may be needed or ordered today. We also reviewed his medications today. he has been encouraged to call the office with any questions or concerns that should arise related to todays visit.    Orders Placed This Encounter  Procedures  . Flu Vaccine MDCK QUAD PF      Time spent: 30 Minutes Time spent includes review of chart, medications, test results and follow-up plan with the patient.  This patient was seen by Theodoro Grist AGNP-C in Collaboration with Dr Lavera Guise as a part of collaborative care agreement     Echavarria Furry. Akira Adelsberger AGNP-C Internal medicine

## 2020-07-27 ENCOUNTER — Encounter: Payer: Self-pay | Admitting: Hospice and Palliative Medicine

## 2020-08-03 ENCOUNTER — Other Ambulatory Visit: Payer: Self-pay | Admitting: Internal Medicine

## 2020-08-03 DIAGNOSIS — I10 Essential (primary) hypertension: Secondary | ICD-10-CM

## 2020-08-06 ENCOUNTER — Ambulatory Visit: Payer: Medicare Other | Admitting: Hospice and Palliative Medicine

## 2020-08-14 DIAGNOSIS — E1165 Type 2 diabetes mellitus with hyperglycemia: Secondary | ICD-10-CM | POA: Diagnosis not present

## 2020-08-14 DIAGNOSIS — J4530 Mild persistent asthma, uncomplicated: Secondary | ICD-10-CM | POA: Diagnosis not present

## 2020-08-14 DIAGNOSIS — E113499 Type 2 diabetes mellitus with severe nonproliferative diabetic retinopathy without macular edema, unspecified eye: Secondary | ICD-10-CM | POA: Diagnosis not present

## 2020-08-14 DIAGNOSIS — I10 Essential (primary) hypertension: Secondary | ICD-10-CM | POA: Diagnosis not present

## 2020-08-20 ENCOUNTER — Ambulatory Visit
Admission: RE | Admit: 2020-08-20 | Discharge: 2020-08-20 | Disposition: A | Discharge status: Home / Self Care | Payer: Medicare Other | Source: Ambulatory Visit | Attending: Hospice and Palliative Medicine | Admitting: Hospice and Palliative Medicine

## 2020-08-20 ENCOUNTER — Ambulatory Visit
Admission: RE | Admit: 2020-08-20 | Discharge: 2020-08-20 | Disposition: A | Discharge status: Home / Self Care | Payer: Medicare Other | Attending: Hospice and Palliative Medicine | Admitting: Hospice and Palliative Medicine

## 2020-08-20 ENCOUNTER — Ambulatory Visit (INDEPENDENT_AMBULATORY_CARE_PROVIDER_SITE_OTHER): Payer: Medicare Other | Admitting: Hospice and Palliative Medicine

## 2020-08-20 ENCOUNTER — Encounter: Payer: Self-pay | Admitting: Hospice and Palliative Medicine

## 2020-08-20 ENCOUNTER — Other Ambulatory Visit: Payer: Self-pay

## 2020-08-20 DIAGNOSIS — Z794 Long term (current) use of insulin: Secondary | ICD-10-CM

## 2020-08-20 DIAGNOSIS — E1165 Type 2 diabetes mellitus with hyperglycemia: Secondary | ICD-10-CM | POA: Diagnosis not present

## 2020-08-20 DIAGNOSIS — J9811 Atelectasis: Secondary | ICD-10-CM | POA: Diagnosis not present

## 2020-08-20 DIAGNOSIS — R059 Cough, unspecified: Secondary | ICD-10-CM

## 2020-08-20 DIAGNOSIS — I10 Essential (primary) hypertension: Secondary | ICD-10-CM

## 2020-08-20 DIAGNOSIS — J0140 Acute pansinusitis, unspecified: Secondary | ICD-10-CM

## 2020-08-20 DIAGNOSIS — J9809 Other diseases of bronchus, not elsewhere classified: Secondary | ICD-10-CM | POA: Diagnosis not present

## 2020-08-20 MED ORDER — ENALAPRIL MALEATE 2.5 MG PO TABS: 2.5000 mg | ORAL_TABLET | Freq: Every day | ORAL | 0 refills | Status: DC

## 2020-08-20 MED ORDER — AZITHROMYCIN 250 MG PO TABS: ORAL_TABLET | ORAL | 0 refills | Status: DC

## 2020-08-20 MED ORDER — ENALAPRIL MALEATE 2.5 MG PO TABS: 2.5000 mg | ORAL_TABLET | Freq: Every day | ORAL | 1 refills | Status: DC

## 2020-08-20 NOTE — Progress Notes (Signed)
[facility] [place], [place] [address]  Internal MEDICINE  Office Visit Note  Patient Name: Daniel Hodges  [id number]  [id number]  Date of Service: 08/21/2020  Chief Complaint  Patient presents with  . Follow-up    cpap machine not received, coughing a lot x2 or 3 months  . Diabetes  . Sleep Apnea  . Asthma  . Hypertension  . Hyperlipidemia  . policy update form    received    HPI Patient is here for routine follow-up Wife is present in the room for exam He presents a log of home blood pressure readings--noted several readings SBP >140, he and wife are both concerned about elevated readings Noted, he has history of long standing uncontrolled DM-not currently prescribed ACE/ARB Endocrinology manages his DM management and treatment--unsure of last A1C reading He continues to complain of sinus congestion as well as coughing--has tried OTC medications, has been ongoing for several weeks  Current Medication: Outpatient Encounter Medications as of 08/20/2020  Medication Sig  . amLODipine (NORVASC) 2.5 MG tablet TAKE 1 TABLET BY MOUTH  DAILY  . aspirin 81 MG tablet Take 81 mg by mouth daily.  Marland Kitchen atorvastatin (LIPITOR) 20 MG tablet TAKE 1 TABLET BY MOUTH AT  BEDTIME  . budesonide-formoterol (SYMBICORT) 160-4.5 MCG/ACT inhaler Inhale 2 puffs into the lungs 2 (two) times daily.   . cholecalciferol (VITAMIN D3) 25 MCG (1000 UT) tablet Take 1,000 Units by mouth daily.  . furosemide (LASIX) 40 MG tablet Take 0.5 tablets (20 mg total) by mouth daily.  Marland Kitchen gabapentin (NEURONTIN) 300 MG capsule TAKE 1 CAPSULE BY MOUTH  DAILY AND 1 CAPSULE BY  MOUTH AT NIGHT  . HUMALOG KWIKPEN 100 UNIT/ML KwikPen Inject 6-10 Units into the skin See admin instructions. Inject 6u under the skin daily at breakfast-time, 8u at lunch-time and inject 10u under the skin daily at dinner-time  . hydrocortisone 1 % ointment Apply small amount mix with lotion at night for leg rash  .  ipratropium-albuterol (DUONEB) 0.5-2.5 (3) MG/3ML SOLN USE 3 ML VIA NEBULIZER EVERY 6 HOURS AS NEEDED  . loratadine (CLARITIN) 10 MG tablet Take 1 tablet (10 mg total) by mouth daily.  . Magnesium 250 MG TABS Take 250 mg by mouth 2 (two) times daily.  . mometasone (NASONEX) 50 MCG/ACT nasal spray Place 2 sprays into the nose daily.  . montelukast (SINGULAIR) 10 MG tablet Take 1 tablet (10 mg total) by mouth daily.  . ONE TOUCH ULTRA TEST test strip USE THREE TIMES DAILY  . senna (SENOKOT) 8.6 MG tablet Take 1 tablet (8.6 mg total) by mouth daily.  . simethicone (MYLICON) 80 MG chewable tablet Chew 2 tablets (160 mg total) by mouth 2 (two) times daily.  [name] SOLOSTAR 300 UNIT/ML SOPN Inject 25 Units into the skin at bedtime.   . vitamin B-12 (CYANOCOBALAMIN) 1000 MCG tablet Take 1,000 mcg by mouth daily.  Marland Kitchen azithromycin (ZITHROMAX Z-PAK) 250 MG tablet Take two 250 mg tablets on day one followed by one 250 mg tablet each day for four days.  . enalapril (VASOTEC) 2.5 MG tablet Take 1 tablet (2.5 mg total) by mouth daily.  . enalapril (VASOTEC) 2.5 MG tablet Take 1 tablet (2.5 mg total) by mouth daily.  . Multiple Vitamin (MULTIVITAMIN WITH MINERALS) TABS tablet Take 1 tablet by mouth daily. (Patient not taking: Reported on 08/20/2020)   No facility-administered encounter medications on file as of 08/20/2020.    Surgical History: Past Surgical History:  Procedure Laterality Date  . APPENDECTOMY    . COLONOSCOPY WITH PROPOFOL N/A 06/11/2015   Procedure: COLONOSCOPY WITH PROPOFOL;  Surgeon: Manya Silvas, MD;  Location: [facility] ENDOSCOPY;  Service: Endoscopy;  Laterality: N/A;  . CORONARY ARTERY BYPASS GRAFT    . HERNIA REPAIR    . TEE WITHOUT CARDIOVERSION    . TRACHEOSTOMY    . VASCULAR SURGERY      Medical History: Past Medical History:  Diagnosis Date  . Anginal pain ([place])   . Asthma   . Coronary artery disease   . Diabetes mellitus without complication ([place])   . Hyperlipidemia    . Hypertension   . Sleep apnea     Family History: Family History  Problem Relation Age of Onset  . Cancer Sister   . Diabetes Daughter   . Diabetes Son     Social History   Socioeconomic History  . Marital status: Married    Spouse name: Not on file  . Number of children: Not on file  . Years of education: Not on file  . Highest education level: Not on file  Occupational History  . Not on file  Tobacco Use  . Smoking status: Never Smoker  . Smokeless tobacco: Never Used  Vaping Use  . Vaping Use: Never used  Substance and Sexual Activity  . Alcohol use: No  . Drug use: No  . Sexual activity: Not on file  Other Topics Concern  . Not on file  Social History Narrative  . Not on file   Social Determinants of Health   Financial Resource Strain:   . Difficulty of Paying Living Expenses: Not on file  Food Insecurity:   . Worried About Charity fundraiser in the Last Year: Not on file  . Ran Out of Food in the Last Year: Not on file  Transportation Needs:   . Lack of Transportation (Medical): Not on file  . Lack of Transportation (Non-Medical): Not on file  Physical Activity:   . Days of Exercise per Week: Not on file  . Minutes of Exercise per Session: Not on file  Stress:   . Feeling of Stress : Not on file  Social Connections:   . Frequency of Communication with Friends and Family: Not on file  . Frequency of Social Gatherings with Friends and Family: Not on file  . Attends Religious Services: Not on file  . Active Member of Clubs or Organizations: Not on file  . Attends Archivist Meetings: Not on file  . Marital Status: Not on file  Intimate Partner Violence:   . Fear of Current or Ex-Partner: Not on file  . Emotionally Abused: Not on file  . Physically Abused: Not on file  . Sexually Abused: Not on file   Review of Systems  Constitutional: Negative for chills, fatigue and unexpected weight change.  HENT: Positive for congestion and sinus  pressure. Negative for postnasal drip, rhinorrhea, sneezing and sore throat.   Eyes: Negative for photophobia, redness and visual disturbance.  Respiratory: Positive for cough. Negative for chest tightness and shortness of breath.   Cardiovascular: Negative for chest pain, palpitations and leg swelling.  Gastrointestinal: Negative for abdominal pain, constipation, diarrhea, nausea and vomiting.  Genitourinary: Negative for dysuria and frequency.  Musculoskeletal: Negative for arthralgias, back pain, joint swelling and neck pain.  Skin: Negative for color change and rash.  Neurological: Negative for tremors and numbness.  Hematological: Negative for adenopathy. Does not bruise/bleed easily.  Psychiatric/Behavioral:  Negative for behavioral problems (Depression), sleep disturbance and suicidal ideas. The patient is not nervous/anxious.     Vital Signs: BP 128/72 Comment: 144/76  Pulse 88   Temp (!) 97.4 F (36.3 C)   Resp 16   Ht 5\' 6"  (1.676 m)   Wt 167 lb 3.2 oz (75.8 kg)   SpO2 98%   BMI 26.99 kg/m    Physical Exam Vitals reviewed.  Constitutional:      Appearance: Normal appearance. He is normal weight.  HENT:     Right Ear: Tympanic membrane normal.     Left Ear: Tympanic membrane normal.     Nose: Congestion present.     Mouth/Throat:     Mouth: Mucous membranes are moist.     Pharynx: Oropharynx is clear.  Eyes:     Pupils: Pupils are equal, round, and reactive to light.  Cardiovascular:     Rate and Rhythm: Normal rate and regular rhythm.     Pulses: Normal pulses.     Heart sounds: Normal heart sounds.  Pulmonary:     Effort: Pulmonary effort is normal.     Breath sounds: Normal breath sounds.  Musculoskeletal:        General: Normal range of motion.     Cervical back: Normal range of motion.  Skin:    General: Skin is warm.  Neurological:     General: No focal deficit present.     Mental Status: He is alert and oriented to person, place, and time. Mental  status is at baseline.  Psychiatric:        Mood and Affect: Mood normal.        Behavior: Behavior normal.        Thought Content: Thought content normal.        Judgment: Judgment normal.    Assessment/Plan: 1. Acute non-recurrent pansinusitis Treat ongoing symptoms with Z-Pak, sent for x-ray due to prolonged coughing - DG Chest 2 View; Future - azithromycin (ZITHROMAX Z-PAK) 250 MG tablet; Take two 250 mg tablets on day one followed by one 250 mg tablet each day for four days.  Dispense: 6 each; Refill: 0 - DG Chest 2 View  2. Type 2 diabetes mellitus with hyperglycemia, with long-term current use of insulin ([place]) Followed by endocrinology, home readings appear stable and improved   3. Essential hypertension Add low dose enalapril for kidney protection and elevated home BP readings, will continue to monitor - enalapril (VASOTEC) 2.5 MG tablet; Take 1 tablet (2.5 mg total) by mouth daily.  Dispense: 90 tablet; Refill: 1 - enalapril (VASOTEC) 2.5 MG tablet; Take 1 tablet (2.5 mg total) by mouth daily.  Dispense: 10 tablet; Refill: 0   General Counseling: [place] verbalizes understanding of the findings of todays visit and agrees with plan of treatment. I have discussed any further diagnostic evaluation that may be needed or ordered today. We also reviewed his medications today. he has been encouraged to call the office with any questions or concerns that should arise related to todays visit.    Orders Placed This Encounter  Procedures  . DG Chest 2 View    Meds ordered this encounter  Medications  . enalapril (VASOTEC) 2.5 MG tablet    Sig: Take 1 tablet (2.5 mg total) by mouth daily.    Dispense:  90 tablet    Refill:  1  . enalapril (VASOTEC) 2.5 MG tablet    Sig: Take 1 tablet (2.5 mg total) by mouth daily.  Dispense:  10 tablet    Refill:  0  . azithromycin (ZITHROMAX Z-PAK) 250 MG tablet    Sig: Take two 250 mg tablets on day one followed by one 250 mg tablet  each day for four days.    Dispense:  6 each    Refill:  0    Time spent: 30 Minutes Time spent includes review of chart, medications, test results and follow-up plan with the patient.  This patient was seen by Theodoro Grist AGNP-C in Collaboration with Dr Lavera Guise as a part of collaborative care agreement     Enberg Furry. Mahlani Berninger AGNP-C Internal medicine

## 2020-08-20 NOTE — Progress Notes (Signed)
Chest x-ray is stable, no evidence of acute findings.Continue with current therapy.

## 2020-08-20 NOTE — Patient Instructions (Signed)
Enalapril Tablets What is this medicine? ENALAPRIL (e NAL a pril) is an ACE inhibitor. It treats high blood pressure and heart failure. This medicine may be used for other purposes; ask your health care provider or pharmacist if you have questions. COMMON BRAND NAME(S): Vasotec What should I tell my health care provider before I take this medicine? They need to know if you have any of these conditions:  bone marrow disease  heart or blood vessel disease  if you are on a special diet, such as a low salt diet  immune system disease like lupus  kidney or liver disease  low blood pressure  previous swelling of the tongue, face, or lips with difficulty breathing, difficulty swallowing, hoarseness, or tightening of the throat  an unusual or allergic reaction to enalapril, other ACE inhibitors, insect venom, foods, dyes, or preservatives  pregnant or trying to get pregnant  breast-feeding How should I use this medicine? Take this drug by mouth. Take it as directed on the prescription label at the same time every day. You can take it with or without food. If it upsets your stomach, take it with food.Keep taking it unless your health care provider tells you to stop. Talk to your health care provider about the use of this drug in children. While it may be prescribed for selected conditions, precautions do apply. Overdosage: If you think you have taken too much of this medicine contact a poison control center or emergency room at once. NOTE: This medicine is only for you. Do not share this medicine with others. What if I miss a dose? If you miss a dose, take it as soon as you can. If it is almost time for your next dose, take only that dose. Do not take double or extra doses. What may interact with this medicine? Do not take this medication with any of the following medications:  sacubitril; valsartan This medicine may also interact with the following:  diuretics  lithium  medicines  for high blood pressure  NSAIDs, medicines for pain and inflammation, like ibuprofen or naproxen  potassium salts or potassium supplements This list may not describe all possible interactions. Give your health care provider a list of all the medicines, herbs, non-prescription drugs, or dietary supplements you use. Also tell them if you smoke, drink alcohol, or use illegal drugs. Some items may interact with your medicine. What should I watch for while using this medicine? Visit your doctor or health care professional for regular checks on your progress. Check your blood pressure as directed. Ask your doctor or health care professional what your blood pressure should be and when you should contact him or her. Call your doctor or health care professional if you notice an irregular or fast heart beat. You may get drowsy or dizzy. Do not drive, use machinery, or do anything that needs mental alertness until you know how this drug affects you. Do not stand or sit up quickly, especially if you are an older patient. This reduces the risk of dizzy or fainting spells. Alcohol can make you more drowsy and dizzy. Avoid alcoholic drinks. Women should inform their doctor if they wish to become pregnant or think they might be pregnant. There is a potential for serious side effects to an unborn child. Talk to your health care professional or pharmacist for more information. Check with your doctor or health care professional if you get an attack of severe diarrhea, nausea and vomiting, or if you sweat a lot. The  loss of too much body fluid can make it dangerous for you to take this medicine. Avoid salt substitutes unless you are told otherwise by your doctor or health care professional. Do not treat yourself for coughs, colds, or pain while you are taking this medicine without asking your doctor or health care professional for advice. Some ingredients may increase your blood pressure. What side effects may I notice  from receiving this medicine? Side effects that you should report to your doctor or health care professional as soon as possible:  allergic reactions like skin rash, itching or hives, swelling of the face, lips, or tongue  breathing problems  change in amount of urine passed  chest pain  feeling faint or lightheaded, falls  fever or chills  numbness or tingling in your fingers or toes  redness, blistering, peeling or loosening of the skin, including inside the mouth  swelling of ankles, legs  unusual bleeding or bruising or pinpoint red spots on the skin Side effects that usually do not require medical attention (report to your doctor or health care professional if they continue or are bothersome):  change in sex drive or performance  cough  sun sensitivity  tiredness This list may not describe all possible side effects. Call your doctor for medical advice about side effects. You may report side effects to FDA at 1-800-FDA-1088. Where should I keep my medicine? Keep out of the reach of children and pets. Store at room temperature between 15 and 30 degrees C (59 and 86 degrees F). Protect from moisture. Keep the container tightly closed. NOTE: This sheet is a summary. It may not cover all possible information. If you have questions about this medicine, talk to your doctor, pharmacist, or health care provider.  2020 Elsevier/Gold Standard (2019-04-05 11:57:37)

## 2020-08-21 ENCOUNTER — Encounter: Payer: Self-pay | Admitting: Hospice and Palliative Medicine

## 2020-08-22 ENCOUNTER — Telehealth: Payer: Self-pay

## 2020-08-22 NOTE — Telephone Encounter (Signed)
Pt wife notified for result

## 2020-08-22 NOTE — Telephone Encounter (Signed)
-----   Message from Luiz Ochoa, NP sent at 08/20/2020  7:20 PM EST ----- Chest x-ray is stable, no evidence of acute findings.Continue with current therapy.

## 2020-08-30 ENCOUNTER — Other Ambulatory Visit: Payer: Self-pay | Admitting: Hospice and Palliative Medicine

## 2020-08-30 DIAGNOSIS — Z794 Long term (current) use of insulin: Secondary | ICD-10-CM

## 2020-09-03 ENCOUNTER — Other Ambulatory Visit: Payer: Self-pay

## 2020-09-03 DIAGNOSIS — J301 Allergic rhinitis due to pollen: Secondary | ICD-10-CM

## 2020-09-03 MED ORDER — MONTELUKAST SODIUM 10 MG PO TABS: 10.0000 mg | ORAL_TABLET | Freq: Every day | ORAL | 5 refills | Status: DC

## 2020-09-19 DIAGNOSIS — I10 Essential (primary) hypertension: Secondary | ICD-10-CM | POA: Diagnosis not present

## 2020-09-19 DIAGNOSIS — E782 Mixed hyperlipidemia: Secondary | ICD-10-CM | POA: Diagnosis not present

## 2020-09-19 DIAGNOSIS — E1122 Type 2 diabetes mellitus with diabetic chronic kidney disease: Secondary | ICD-10-CM | POA: Diagnosis not present

## 2020-09-19 DIAGNOSIS — I2510 Atherosclerotic heart disease of native coronary artery without angina pectoris: Secondary | ICD-10-CM | POA: Diagnosis not present

## 2020-09-19 DIAGNOSIS — Z23 Encounter for immunization: Secondary | ICD-10-CM | POA: Diagnosis not present

## 2020-09-19 DIAGNOSIS — R059 Cough, unspecified: Secondary | ICD-10-CM | POA: Diagnosis not present

## 2020-09-19 DIAGNOSIS — N181 Chronic kidney disease, stage 1: Secondary | ICD-10-CM | POA: Diagnosis not present

## 2020-09-19 DIAGNOSIS — I25118 Atherosclerotic heart disease of native coronary artery with other forms of angina pectoris: Secondary | ICD-10-CM | POA: Diagnosis not present

## 2020-09-20 ENCOUNTER — Other Ambulatory Visit: Payer: Self-pay

## 2020-09-20 MED ORDER — FUROSEMIDE 40 MG PO TABS
20.0000 mg | ORAL_TABLET | Freq: Every day | ORAL | 1 refills | Status: DC
Start: 2020-09-20 — End: 2021-03-19

## 2020-09-24 ENCOUNTER — Telehealth: Payer: Self-pay

## 2020-09-24 NOTE — Telephone Encounter (Signed)
Pt wife called that just for FYI his cardiology stopped enalapril and start on cozaar as per taylor advised that follow cardiology instructions

## 2020-09-26 ENCOUNTER — Ambulatory Visit: Payer: Medicare Other

## 2020-09-26 ENCOUNTER — Other Ambulatory Visit: Payer: Self-pay

## 2020-09-26 ENCOUNTER — Telehealth: Payer: Self-pay

## 2020-09-26 NOTE — Telephone Encounter (Signed)
Patient did not come to check out after appointment for cpap, he will need to return call and schedule pulmonary follow up and one month download first. Daniel Hodges

## 2020-10-02 ENCOUNTER — Ambulatory Visit: Payer: Medicare Other | Admitting: Internal Medicine

## 2020-10-05 ENCOUNTER — Ambulatory Visit: Payer: Medicare Other | Admitting: Internal Medicine

## 2020-10-12 ENCOUNTER — Ambulatory Visit (INDEPENDENT_AMBULATORY_CARE_PROVIDER_SITE_OTHER): Payer: Medicare Other | Admitting: Physician Assistant

## 2020-10-12 ENCOUNTER — Other Ambulatory Visit: Payer: Self-pay | Admitting: Hospice and Palliative Medicine

## 2020-10-12 ENCOUNTER — Encounter: Payer: Self-pay | Admitting: Physician Assistant

## 2020-10-12 VITALS — BP 128/76 | HR 97 | Temp 98.3°F | Resp 16 | Ht 66.0 in | Wt 170.2 lb

## 2020-10-12 DIAGNOSIS — J42 Unspecified chronic bronchitis: Secondary | ICD-10-CM | POA: Diagnosis not present

## 2020-10-12 DIAGNOSIS — R627 Adult failure to thrive: Secondary | ICD-10-CM

## 2020-10-12 DIAGNOSIS — I10 Essential (primary) hypertension: Secondary | ICD-10-CM

## 2020-10-12 DIAGNOSIS — Z794 Long term (current) use of insulin: Secondary | ICD-10-CM | POA: Diagnosis not present

## 2020-10-12 DIAGNOSIS — E1165 Type 2 diabetes mellitus with hyperglycemia: Secondary | ICD-10-CM

## 2020-10-12 MED ORDER — GLUCERNA SHAKE PO LIQD
ORAL | 5 refills | Status: AC
Start: 2020-10-12 — End: ?

## 2020-10-12 NOTE — Progress Notes (Signed)
[facility] [place], [place] [address]  Internal MEDICINE  Office Visit Note  Patient Name: Daniel Hodges  [id number]  [id number]  Date of Service: 10/12/2020  Chief Complaint  Patient presents with  . Hypertension    Seen by cardiology and one of his Bp meds was changed due to cough  . Diabetes    Discuss diabetic numbers.    . Results    Pt would like to discuss lab and chest xray results    HPI  Pt is here for routine follow up with his wife and wants to discuss his chest Xray findings and labs Discussed that the xray findings were stable and showed chronic right basilar atelectasis with improved bronchitic changes, but no acute findings. Additionally discussed his renal function improved. He is followed by endocrinology and has an appt with them next month to discuss diabetes. Reports blood sugar 89-99 usually, sometimes 124, but evening sugars are higher at 200-250. He is also followed by cardiology who changed his BP med and had him stop enalapril due to cough. BP at home has been stable, but his BP cuff is no longer working and they would like to get a new one. Additionally, he would like a script for Glucerna since it has become more expensive and is the only meal supplement he can take with his DM.   Current Medication: Outpatient Encounter Medications as of 10/12/2020  Medication Sig Note  . feeding supplement, GLUCERNA SHAKE, ([place]) LIQD One a day   . ferrous sulfate 324 MG TBEC Take by mouth.   Marland Kitchen glucose blood test strip    . niacin (NIASPAN) 500 MG CR tablet niacin ER 500 mg tablet,extended release 24 hr   . amLODipine (NORVASC) 2.5 MG tablet TAKE 1 TABLET BY MOUTH  DAILY   . aspirin 81 MG tablet Take 81 mg by mouth daily.   Marland Kitchen atorvastatin (LIPITOR) 20 MG tablet TAKE 1 TABLET BY MOUTH AT  BEDTIME   . budesonide-formoterol (SYMBICORT) 160-4.5 MCG/ACT inhaler Inhale 2 puffs into the lungs 2 (two) times daily.    . cholecalciferol  (VITAMIN D3) 25 MCG (1000 UT) tablet Take 1,000 Units by mouth daily.   . furosemide (LASIX) 40 MG tablet Take 0.5 tablets (20 mg total) by mouth daily.   Marland Kitchen gabapentin (NEURONTIN) 300 MG capsule TAKE 1 CAPSULE BY MOUTH  DAILY AND 1 CAPSULE BY  MOUTH AT NIGHT   . HUMALOG KWIKPEN 100 UNIT/ML KwikPen Inject 6-10 Units into the skin See admin instructions. Inject 6u under the skin daily at breakfast-time, 8u at lunch-time and inject 10u under the skin daily at dinner-time   . hydrocortisone 1 % ointment Apply small amount mix with lotion at night for leg rash   . ipratropium-albuterol (DUONEB) 0.5-2.5 (3) MG/3ML SOLN USE 3 ML VIA NEBULIZER EVERY 6 HOURS AS NEEDED   . loratadine (CLARITIN) 10 MG tablet Take 1 tablet (10 mg total) by mouth daily.   Marland Kitchen losartan (COZAAR) 25 MG tablet Take 25 mg by mouth daily.   . Magnesium 250 MG TABS Take 250 mg by mouth 2 (two) times daily.   . mometasone (NASONEX) 50 MCG/ACT nasal spray Place 2 sprays into the nose daily.   . montelukast (SINGULAIR) 10 MG tablet Take 1 tablet (10 mg total) by mouth daily.   . Multiple Vitamin (MULTIVITAMIN WITH MINERALS) TABS tablet Take 1 tablet by mouth daily. (Patient not taking: Reported on 08/20/2020)   . ONE TOUCH  ULTRA TEST test strip USE THREE TIMES DAILY   . senna (SENOKOT) 8.6 MG tablet Take 1 tablet (8.6 mg total) by mouth daily.   . simethicone (MYLICON) 80 MG chewable tablet Chew 2 tablets (160 mg total) by mouth 2 (two) times daily.   [name] SOLOSTAR 300 UNIT/ML SOPN Inject 25 Units into the skin at bedtime.    . vitamin B-12 (CYANOCOBALAMIN) 1000 MCG tablet Take 1,000 mcg by mouth daily.   . [DISCONTINUED] azithromycin (ZITHROMAX Z-PAK) 250 MG tablet Take two 250 mg tablets on day one followed by one 250 mg tablet each day for four days. (Patient not taking: Reported on 10/12/2020)   . [DISCONTINUED] enalapril (VASOTEC) 2.5 MG tablet Take 1 tablet (2.5 mg total) by mouth daily. (Patient not taking: No sig reported)  09/24/2020: Stopped by cardiology and put on cozaar 25mg    . [DISCONTINUED] enalapril (VASOTEC) 2.5 MG tablet Take 1 tablet (2.5 mg total) by mouth daily. (Patient not taking: Reported on 10/12/2020)    No facility-administered encounter medications on file as of 10/12/2020.    Surgical History: Past Surgical History:  Procedure Laterality Date  . APPENDECTOMY    . COLONOSCOPY WITH PROPOFOL N/A 06/11/2015   Procedure: COLONOSCOPY WITH PROPOFOL;  Surgeon: Manya Silvas, MD;  Location: [facility] ENDOSCOPY;  Service: Endoscopy;  Laterality: N/A;  . CORONARY ARTERY BYPASS GRAFT    . HERNIA REPAIR    . TEE WITHOUT CARDIOVERSION    . TRACHEOSTOMY    . VASCULAR SURGERY      Medical History: Past Medical History:  Diagnosis Date  . Anginal pain ([place])   . Asthma   . Coronary artery disease   . Diabetes mellitus without complication ([place])   . Hyperlipidemia   . Hypertension   . Sleep apnea     Family History: Family History  Problem Relation Age of Onset  . Cancer Sister   . Diabetes Daughter   . Diabetes Son     Social History   Socioeconomic History  . Marital status: Married    Spouse name: Not on file  . Number of children: Not on file  . Years of education: Not on file  . Highest education level: Not on file  Occupational History  . Not on file  Tobacco Use  . Smoking status: Never Smoker  . Smokeless tobacco: Never Used  Vaping Use  . Vaping Use: Never used  Substance and Sexual Activity  . Alcohol use: No  . Drug use: No  . Sexual activity: Not on file  Other Topics Concern  . Not on file  Social History Narrative  . Not on file   Social Determinants of Health   Financial Resource Strain: Not on file  Food Insecurity: Not on file  Transportation Needs: Not on file  Physical Activity: Not on file  Stress: Not on file  Social Connections: Not on file  Intimate Partner Violence: Not on file      Review of Systems  Constitutional: Negative for chills,  fatigue and unexpected weight change.  HENT: Positive for postnasal drip. Negative for congestion, rhinorrhea, sneezing and sore throat.   Eyes: Negative for redness.  Respiratory: Positive for cough. Negative for chest tightness and shortness of breath.   Cardiovascular: Negative for chest pain and palpitations.  Gastrointestinal: Negative for abdominal pain, constipation, diarrhea, nausea and vomiting.  Genitourinary: Negative for dysuria and frequency.  Musculoskeletal: Negative for arthralgias, back pain, joint swelling and neck pain.  Skin: Negative for  rash.  Neurological: Negative.  Negative for tremors and numbness.  Hematological: Negative for adenopathy. Does not bruise/bleed easily.  Psychiatric/Behavioral: Negative for behavioral problems (Depression), sleep disturbance and suicidal ideas. The patient is not nervous/anxious.     Vital Signs: BP 128/76   Pulse 97   Temp 98.3 F (36.8 C)   Resp 16   Ht 5\' 6"  (1.676 m)   Wt 170 lb 3.2 oz (77.2 kg)   SpO2 99%   BMI 27.47 kg/m    Physical Exam Constitutional:      General: He is not in acute distress.    Appearance: He is well-developed. He is not diaphoretic.  HENT:     Head: Normocephalic and atraumatic.     Mouth/Throat:     Pharynx: No oropharyngeal exudate.  Eyes:     Pupils: Pupils are equal, round, and reactive to light.  Neck:     Thyroid: No thyromegaly.     Vascular: No JVD.     Trachea: No tracheal deviation.  Cardiovascular:     Rate and Rhythm: Normal rate and regular rhythm.     Heart sounds: Normal heart sounds. No murmur heard. No friction rub. No gallop.   Pulmonary:     Effort: Pulmonary effort is normal. No respiratory distress.     Breath sounds: No wheezing or rales.  Chest:     Chest wall: No tenderness.  Abdominal:     General: Bowel sounds are normal.     Palpations: Abdomen is soft.  Musculoskeletal:        General: Normal range of motion.     Cervical back: Normal range of motion  and neck supple.  Lymphadenopathy:     Cervical: No cervical adenopathy.  Skin:    General: Skin is warm and dry.  Neurological:     Mental Status: He is alert and oriented to person, place, and time.     Cranial Nerves: No cranial nerve deficit.  Psychiatric:        Behavior: Behavior normal.        Thought Content: Thought content normal.        Judgment: Judgment normal.        Assessment/Plan: 1. Chronic bronchitis, unspecified chronic bronchitis type ([place]) Chest xray showed chronic right atelectasis and improving bronchitic changes. Pt is not a smoker. He will continue his current therapy.  He will need PFT next visit.  2. Type 2 diabetes mellitus with hyperglycemia, with long-term current use of insulin (HCC) Pt is followed by endocrinology and has appt next month. - feeding supplement, GLUCERNA SHAKE, ([place]) LIQD; One a day  Dispense: 237 mL; Refill: 5  3. Essential hypertension Pt is followed by cardiology who d/c ACE due to cough. He will continue Norvasc 2.5 mg, Cozaar 25mg , and lasix 40mg . Script sent for new BP monitor so he can continue to monitor at home.  4. Failure to thrive in adult - feeding supplement, GLUCERNA SHAKE, ([place]) LIQD; One a day  Dispense: 237 mL; Refill: 5  General Counseling: Xzavier verbalizes understanding of the findings of todays visit and agrees with plan of treatment. I have discussed any further diagnostic evaluation that may be needed or ordered today. We also reviewed his medications today. he has been encouraged to call the office with any questions or concerns that should arise related to todays visit.    No orders of the defined types were placed in this encounter.   Meds ordered this encounter  Medications  . feeding supplement, GLUCERNA SHAKE, (GLUCERNA SHAKE) LIQD    Sig: One a day    Dispense:  237 mL    Refill:  5    Total time spent: 30 Minutes Time spent includes review of chart, medications,  test results, and follow up plan with the patient.      Dr Lavera Guise Internal medicine

## 2020-10-16 ENCOUNTER — Encounter: Payer: Self-pay | Admitting: Internal Medicine

## 2020-10-16 ENCOUNTER — Ambulatory Visit (INDEPENDENT_AMBULATORY_CARE_PROVIDER_SITE_OTHER): Payer: Medicare Other | Admitting: Internal Medicine

## 2020-10-16 VITALS — BP 134/80 | HR 92 | Temp 98.3°F | Resp 16 | Ht 66.0 in | Wt 165.2 lb

## 2020-10-16 DIAGNOSIS — R059 Cough, unspecified: Secondary | ICD-10-CM

## 2020-10-16 DIAGNOSIS — J4531 Mild persistent asthma with (acute) exacerbation: Secondary | ICD-10-CM

## 2020-10-16 DIAGNOSIS — R300 Dysuria: Secondary | ICD-10-CM

## 2020-10-16 LAB — POCT URINALYSIS DIPSTICK
Bilirubin, UA: NEGATIVE
Blood, UA: NEGATIVE
Glucose, UA: NEGATIVE
Ketones, UA: NEGATIVE
Leukocytes, UA: NEGATIVE
Nitrite, UA: NEGATIVE
Protein, UA: NEGATIVE
Spec Grav, UA: 1.01 (ref 1.010–1.025)
Urobilinogen, UA: 0.2 E.U./dL
pH, UA: 5 (ref 5.0–8.0)

## 2020-10-16 MED ORDER — HYDROCOD POLST-CPM POLST ER 10-8 MG/5ML PO SUER
ORAL | 0 refills | Status: DC
Start: 2020-10-16 — End: 2021-09-19

## 2020-10-16 NOTE — Progress Notes (Signed)
[facility] [place], [place] [address]  Internal MEDICINE  Telephone Visit  Patient Name: Daniel Hodges  [id number]  [id number]  Date of Service: 10/25/2020  I connected with the patient at 1155 by telephone and verified the patients identity using two identifiers.   I discussed the limitations, risks, security and privacy concerns of performing an evaluation and management service by telephone and the availability of in person appointments. I also discussed with the patient that there may be a patient responsible charge related to the service.  The patient expressed understanding and agrees to proceed.    Chief Complaint  Patient presents with  . Acute Visit    Sinuses, chill, cough, fever started 2 or 3 days ago, it happens in the evening, blood sugar was 100 but he ate breakfast, stomach was tight since Thursday night, and he had hiccups, pt thinks he might be backed up  . Telephone Assessment    Video call   . Telephone Screen    [phone]    HPI Pt is seen for acute and sick visit, intially connected via televist however then was instructed to come in the office, pt has been coughing and staying up at night, last night was worse, mild sob  Diabetes has been managed by endocrinology  Does see cardiology and pulmonary as well  Increased frequency of urination as well   Current Medication: Outpatient Encounter Medications as of 10/16/2020  Medication Sig  . chlorpheniramine-HYDROcodone (TUSSIONEX PENNKINETIC ER) 10-8 MG/5ML SUER Take 5 cc at night for cough  . amLODipine (NORVASC) 2.5 MG tablet TAKE 1 TABLET BY MOUTH  DAILY  . aspirin 81 MG tablet Take 81 mg by mouth daily.  Marland Kitchen atorvastatin (LIPITOR) 20 MG tablet TAKE 1 TABLET BY MOUTH AT  BEDTIME  . budesonide-formoterol (SYMBICORT) 160-4.5 MCG/ACT inhaler Inhale 2 puffs into the lungs 2 (two) times daily.   . cholecalciferol (VITAMIN D3) 25 MCG (1000 UT) tablet Take 1,000 Units by mouth daily.   . feeding supplement, GLUCERNA SHAKE, ([place]) LIQD One a day  . ferrous sulfate 324 MG TBEC Take by mouth.  . furosemide (LASIX) 40 MG tablet Take 0.5 tablets (20 mg total) by mouth daily.  Marland Kitchen gabapentin (NEURONTIN) 300 MG capsule TAKE 1 CAPSULE BY MOUTH  DAILY AND 1 CAPSULE BY  MOUTH AT NIGHT  . glucose blood test strip   . HUMALOG KWIKPEN 100 UNIT/ML KwikPen Inject 6-10 Units into the skin See admin instructions. Inject 6u under the skin daily at breakfast-time, 8u at lunch-time and inject 10u under the skin daily at dinner-time  . hydrocortisone 1 % ointment Apply small amount mix with lotion at night for leg rash  . ipratropium-albuterol (DUONEB) 0.5-2.5 (3) MG/3ML SOLN USE 3 ML VIA NEBULIZER EVERY 6 HOURS AS NEEDED  . loratadine (CLARITIN) 10 MG tablet Take 1 tablet (10 mg total) by mouth daily.  Marland Kitchen losartan (COZAAR) 25 MG tablet Take 25 mg by mouth daily.  . Magnesium 250 MG TABS Take 250 mg by mouth 2 (two) times daily.  . mometasone (NASONEX) 50 MCG/ACT nasal spray Place 2 sprays into the nose daily.  . montelukast (SINGULAIR) 10 MG tablet Take 1 tablet (10 mg total) by mouth daily.  . Multiple Vitamin (MULTIVITAMIN WITH MINERALS) TABS tablet Take 1 tablet by mouth daily. (Patient not taking: Reported on 08/20/2020)  . niacin (NIASPAN) 500 MG CR tablet niacin ER 500 mg tablet,extended release 24 hr  . ONE TOUCH ULTRA TEST  test strip USE THREE TIMES DAILY  . senna (SENOKOT) 8.6 MG tablet Take 1 tablet (8.6 mg total) by mouth daily.  . simethicone (MYLICON) 80 MG chewable tablet Chew 2 tablets (160 mg total) by mouth 2 (two) times daily.  [name] SOLOSTAR 300 UNIT/ML SOPN Inject 25 Units into the skin at bedtime.   . vitamin B-12 (CYANOCOBALAMIN) 1000 MCG tablet Take 1,000 mcg by mouth daily.   No facility-administered encounter medications on file as of 10/16/2020.    Surgical History: Past Surgical History:  Procedure Laterality Date  . APPENDECTOMY    . COLONOSCOPY WITH  PROPOFOL N/A 06/11/2015   Procedure: COLONOSCOPY WITH PROPOFOL;  Surgeon: Manya Silvas, MD;  Location: [facility] ENDOSCOPY;  Service: Endoscopy;  Laterality: N/A;  . CORONARY ARTERY BYPASS GRAFT    . HERNIA REPAIR    . TEE WITHOUT CARDIOVERSION    . TRACHEOSTOMY    . VASCULAR SURGERY      Medical History: Past Medical History:  Diagnosis Date  . Anginal pain ([place])   . Asthma   . Coronary artery disease   . Diabetes mellitus without complication ([place])   . Hyperlipidemia   . Hypertension   . Sleep apnea     Family History: Family History  Problem Relation Age of Onset  . Cancer Sister   . Diabetes Daughter   . Diabetes Son     Social History   Socioeconomic History  . Marital status: Married    Spouse name: Not on file  . Number of children: Not on file  . Years of education: Not on file  . Highest education level: Not on file  Occupational History  . Not on file  Tobacco Use  . Smoking status: Never Smoker  . Smokeless tobacco: Never Used  Vaping Use  . Vaping Use: Never used  Substance and Sexual Activity  . Alcohol use: No  . Drug use: No  . Sexual activity: Not on file  Other Topics Concern  . Not on file  Social History Narrative  . Not on file   Social Determinants of Health   Financial Resource Strain: Not on file  Food Insecurity: Not on file  Transportation Needs: Not on file  Physical Activity: Not on file  Stress: Not on file  Social Connections: Not on file  Intimate Partner Violence: Not on file      Review of Systems  Constitutional: Negative for fatigue and fever.  HENT: Negative for congestion, mouth sores and postnasal drip.   Respiratory: Positive for cough.   Cardiovascular: Negative for chest pain.  Genitourinary: Negative for flank pain.  Psychiatric/Behavioral: Negative.     Vital Signs: BP 134/80   Pulse 92   Temp 98.3 F (36.8 C)   Resp 16   Ht 5\' 6"  (1.676 m)   Wt 165 lb 3.2 oz (74.9 kg)   SpO2 98%   BMI 26.66  kg/m    Observation/Objective: Pt is called back to be seen in the office Awake and alert NAD. Lungs mild wheezing bilateral  Card S1S2     Assessment/Plan: 1. Mild persistent asthma with acute exacerbation Pt is to continue all MDI as before. Take meds as prescribed  - chlorpheniramine-HYDROcodone (TUSSIONEX PENNKINETIC ER) 10-8 MG/5ML SUER; Take 5 cc at night for cough  Dispense: 140 mL; Refill: 0 - DG Chest 2 View; Future  2. Dysuria Urine is WNL, sent for c/s  - POCT Urinalysis Dipstick  General Counseling: Mesiah verbalizes understanding of  the findings of today's phone visit and agrees with plan of treatment. I have discussed any further diagnostic evaluation that may be needed or ordered today. We also reviewed his medications today. he has been encouraged to call the office with any questions or concerns that should arise related to todays visit.    Orders Placed This Encounter  Procedures  . DG Chest 2 View  . POCT Urinalysis Dipstick    Meds ordered this encounter  Medications  . chlorpheniramine-HYDROcodone (TUSSIONEX PENNKINETIC ER) 10-8 MG/5ML SUER    Sig: Take 5 cc at night for cough    Dispense:  140 mL    Refill:  0    Time spent:30 Minutes    Dr Lavera Guise Internal medicine

## 2020-10-17 ENCOUNTER — Other Ambulatory Visit: Payer: Self-pay

## 2020-10-17 ENCOUNTER — Ambulatory Visit
Admission: RE | Admit: 2020-10-17 | Discharge: 2020-10-17 | Disposition: A | Discharge status: Home / Self Care | Payer: Medicare Other | Attending: Hospice and Palliative Medicine | Admitting: Hospice and Palliative Medicine

## 2020-10-17 ENCOUNTER — Ambulatory Visit
Admission: RE | Admit: 2020-10-17 | Discharge: 2020-10-17 | Disposition: A | Discharge status: Home / Self Care | Payer: Medicare Other | Source: Ambulatory Visit | Attending: Internal Medicine | Admitting: Internal Medicine

## 2020-10-17 DIAGNOSIS — R059 Cough, unspecified: Secondary | ICD-10-CM | POA: Diagnosis not present

## 2020-10-17 DIAGNOSIS — R079 Chest pain, unspecified: Secondary | ICD-10-CM | POA: Diagnosis not present

## 2020-10-17 DIAGNOSIS — R0602 Shortness of breath: Secondary | ICD-10-CM | POA: Diagnosis not present

## 2020-10-17 NOTE — Progress Notes (Signed)
Normal CXR

## 2020-10-17 NOTE — Progress Notes (Signed)
PT WIFE ADVISED CHEST XRAY NORMAL

## 2020-11-08 DIAGNOSIS — E7800 Pure hypercholesterolemia, unspecified: Secondary | ICD-10-CM | POA: Diagnosis not present

## 2020-11-08 DIAGNOSIS — E1165 Type 2 diabetes mellitus with hyperglycemia: Secondary | ICD-10-CM | POA: Diagnosis not present

## 2020-11-15 DIAGNOSIS — E1165 Type 2 diabetes mellitus with hyperglycemia: Secondary | ICD-10-CM | POA: Diagnosis not present

## 2020-11-15 DIAGNOSIS — E083499 Diabetes mellitus due to underlying condition with severe nonproliferative diabetic retinopathy without macular edema, unspecified eye: Secondary | ICD-10-CM | POA: Diagnosis not present

## 2020-11-15 DIAGNOSIS — E113499 Type 2 diabetes mellitus with severe nonproliferative diabetic retinopathy without macular edema, unspecified eye: Secondary | ICD-10-CM | POA: Diagnosis not present

## 2020-11-15 DIAGNOSIS — E0821 Diabetes mellitus due to underlying condition with diabetic nephropathy: Secondary | ICD-10-CM | POA: Diagnosis not present

## 2020-11-15 DIAGNOSIS — J4530 Mild persistent asthma, uncomplicated: Secondary | ICD-10-CM | POA: Diagnosis not present

## 2020-11-15 DIAGNOSIS — I10 Essential (primary) hypertension: Secondary | ICD-10-CM | POA: Diagnosis not present

## 2020-11-15 DIAGNOSIS — G4730 Sleep apnea, unspecified: Secondary | ICD-10-CM | POA: Diagnosis not present

## 2020-11-15 DIAGNOSIS — I2510 Atherosclerotic heart disease of native coronary artery without angina pectoris: Secondary | ICD-10-CM | POA: Diagnosis not present

## 2020-11-15 DIAGNOSIS — E7800 Pure hypercholesterolemia, unspecified: Secondary | ICD-10-CM | POA: Diagnosis not present

## 2020-12-07 ENCOUNTER — Encounter: Payer: Self-pay | Admitting: Physician Assistant

## 2020-12-07 ENCOUNTER — Ambulatory Visit (INDEPENDENT_AMBULATORY_CARE_PROVIDER_SITE_OTHER): Payer: Medicare Other | Admitting: Physician Assistant

## 2020-12-07 ENCOUNTER — Other Ambulatory Visit: Payer: Self-pay

## 2020-12-07 DIAGNOSIS — Z794 Long term (current) use of insulin: Secondary | ICD-10-CM | POA: Diagnosis not present

## 2020-12-07 DIAGNOSIS — E1165 Type 2 diabetes mellitus with hyperglycemia: Secondary | ICD-10-CM

## 2020-12-07 DIAGNOSIS — I10 Essential (primary) hypertension: Secondary | ICD-10-CM

## 2020-12-07 NOTE — Progress Notes (Signed)
[facility] [place], [place] [address]  Internal MEDICINE  Office Visit Note  Patient Name: Daniel Hodges  [id number]  [id number]  Date of Service: 12/07/2020  Chief Complaint  Patient presents with  . Follow-up    Discuss bone density, last A1C 8.6 this month  . Hyperlipidemia  . Hypertension  . Diabetes  . Asthma  . Sleep Apnea    HPI Pt is here today for f/u to discuss recent labs done by Dr. Francoise Schaumann who he sees for diabetes management. Pt's wife thought that Dr. Francoise Schaumann wanted him to f/u with PCP for possibe bone density scan, however wife states she may have misunderstood and now thinks maybe it had to do with his kidneys. Upon reviewing labs his creatine and GFR are stable. His alk phos was high though. Pt unclear on what he is here for and what to do next. Will need to discuss with Dr. Thomasene Mohair visit note to determine what next steps are needed and proceed from there. -Eating well and states BP has been high at home in 140s/80s, he is only taking the losartan. BP is low in office today with recheck 110/62. Educated to watch it closely and monitor for low readings at home. Pt has not had much water today and will increase fluid intake. He denies any lightheadedness, vision changes, headaches.   Current Medication: Outpatient Encounter Medications as of 12/07/2020  Medication Sig  . amLODipine (NORVASC) 2.5 MG tablet TAKE 1 TABLET BY MOUTH  DAILY  . aspirin 81 MG tablet Take 81 mg by mouth daily.  Marland Kitchen atorvastatin (LIPITOR) 20 MG tablet TAKE 1 TABLET BY MOUTH AT  BEDTIME  . budesonide-formoterol (SYMBICORT) 160-4.5 MCG/ACT inhaler Inhale 2 puffs into the lungs 2 (two) times daily.   . chlorpheniramine-HYDROcodone (TUSSIONEX PENNKINETIC ER) 10-8 MG/5ML SUER Take 5 cc at night for cough  . cholecalciferol (VITAMIN D3) 25 MCG (1000 UT) tablet Take 1,000 Units by mouth daily.  . feeding supplement, GLUCERNA SHAKE, ([place]) LIQD One a day   . ferrous sulfate 324 MG TBEC Take by mouth.  . furosemide (LASIX) 40 MG tablet Take 0.5 tablets (20 mg total) by mouth daily.  Marland Kitchen gabapentin (NEURONTIN) 300 MG capsule TAKE 1 CAPSULE BY MOUTH  DAILY AND 1 CAPSULE BY  MOUTH AT NIGHT  . glucose blood test strip   . HUMALOG KWIKPEN 100 UNIT/ML KwikPen Inject 6-10 Units into the skin See admin instructions. Inject 6u under the skin daily at breakfast-time, 8u at lunch-time and inject 10u under the skin daily at dinner-time  . hydrocortisone 1 % ointment Apply small amount mix with lotion at night for leg rash  . ipratropium-albuterol (DUONEB) 0.5-2.5 (3) MG/3ML SOLN USE 3 ML VIA NEBULIZER EVERY 6 HOURS AS NEEDED  . loratadine (CLARITIN) 10 MG tablet Take 1 tablet (10 mg total) by mouth daily.  Marland Kitchen losartan (COZAAR) 25 MG tablet Take 25 mg by mouth daily.  . Magnesium 250 MG TABS Take 250 mg by mouth 2 (two) times daily.  . mometasone (NASONEX) 50 MCG/ACT nasal spray Place 2 sprays into the nose daily.  . montelukast (SINGULAIR) 10 MG tablet Take 1 tablet (10 mg total) by mouth daily.  . niacin (NIASPAN) 500 MG CR tablet niacin ER 500 mg tablet,extended release 24 hr  . ONE TOUCH ULTRA TEST test strip USE THREE TIMES DAILY  . senna (SENOKOT) 8.6 MG tablet Take 1 tablet (8.6 mg total) by mouth daily.  . simethicone (MYLICON)  80 MG chewable tablet Chew 2 tablets (160 mg total) by mouth 2 (two) times daily.  [name] SOLOSTAR 300 UNIT/ML SOPN Inject 25 Units into the skin at bedtime.   . vitamin B-12 (CYANOCOBALAMIN) 1000 MCG tablet Take 1,000 mcg by mouth daily.  . [DISCONTINUED] Multiple Vitamin (MULTIVITAMIN WITH MINERALS) TABS tablet Take 1 tablet by mouth daily. (Patient not taking: Reported on 08/20/2020)   No facility-administered encounter medications on file as of 12/07/2020.    Surgical History: Past Surgical History:  Procedure Laterality Date  . APPENDECTOMY    . COLONOSCOPY WITH PROPOFOL N/A 06/11/2015   Procedure: COLONOSCOPY WITH  PROPOFOL;  Surgeon: Manya Silvas, MD;  Location: [facility] ENDOSCOPY;  Service: Endoscopy;  Laterality: N/A;  . CORONARY ARTERY BYPASS GRAFT    . HERNIA REPAIR    . TEE WITHOUT CARDIOVERSION    . TRACHEOSTOMY    . VASCULAR SURGERY      Medical History: Past Medical History:  Diagnosis Date  . Anginal pain ([place])   . Asthma   . Coronary artery disease   . Diabetes mellitus without complication ([place])   . Hyperlipidemia   . Hypertension   . Sleep apnea     Family History: Family History  Problem Relation Age of Onset  . Cancer Sister   . Diabetes Daughter   . Diabetes Son     Social History   Socioeconomic History  . Marital status: Married    Spouse name: Not on file  . Number of children: Not on file  . Years of education: Not on file  . Highest education level: Not on file  Occupational History  . Not on file  Tobacco Use  . Smoking status: Never Smoker  . Smokeless tobacco: Never Used  Vaping Use  . Vaping Use: Never used  Substance and Sexual Activity  . Alcohol use: No  . Drug use: No  . Sexual activity: Not on file  Other Topics Concern  . Not on file  Social History Narrative  . Not on file   Social Determinants of Health   Financial Resource Strain: Not on file  Food Insecurity: Not on file  Transportation Needs: Not on file  Physical Activity: Not on file  Stress: Not on file  Social Connections: Not on file  Intimate Partner Violence: Not on file      Review of Systems  Constitutional: Negative for chills, fatigue and unexpected weight change.  HENT: Negative for congestion, postnasal drip, rhinorrhea, sneezing and sore throat.   Eyes: Negative for redness.  Respiratory: Negative for cough, chest tightness and shortness of breath.   Cardiovascular: Negative for chest pain and palpitations.  Gastrointestinal: Negative for abdominal pain, constipation, diarrhea, nausea and vomiting.  Genitourinary: Negative for dysuria and frequency.   Musculoskeletal: Positive for arthralgias. Negative for back pain, joint swelling and neck pain.  Skin: Negative for rash.  Neurological: Negative.  Negative for tremors and numbness.  Hematological: Negative for adenopathy. Does not bruise/bleed easily.  Psychiatric/Behavioral: Negative for behavioral problems (Depression), sleep disturbance and suicidal ideas. The patient is not nervous/anxious.     Vital Signs: BP (!) 112/56   Pulse 87   Temp (!) 97.4 F (36.3 C)   Resp 16   Ht _0  (1.676 m)   Wt 165 lb 9.6 oz (75.1 kg)   SpO2 98%   BMI 26.73 kg/m    Physical Exam Constitutional:      General: He is not in acute distress.  Appearance: He is well-developed and normal weight. He is not diaphoretic.  HENT:     Head: Normocephalic and atraumatic.     Mouth/Throat:     Pharynx: No oropharyngeal exudate.  Eyes:     Pupils: Pupils are equal, round, and reactive to light.  Neck:     Thyroid: No thyromegaly.     Vascular: No JVD.     Trachea: No tracheal deviation.  Cardiovascular:     Rate and Rhythm: Normal rate and regular rhythm.     Heart sounds: Normal heart sounds. No murmur heard. No friction rub. No gallop.   Pulmonary:     Effort: Pulmonary effort is normal. No respiratory distress.     Breath sounds: No wheezing or rales.  Chest:     Chest wall: No tenderness.  Abdominal:     General: Bowel sounds are normal.     Palpations: Abdomen is soft.  Musculoskeletal:        General: Normal range of motion.     Cervical back: Normal range of motion and neck supple.  Lymphadenopathy:     Cervical: No cervical adenopathy.  Skin:    General: Skin is warm and dry.  Neurological:     Mental Status: He is alert and oriented to person, place, and time.     Cranial Nerves: No cranial nerve deficit.  Psychiatric:        Behavior: Behavior normal.        Thought Content: Thought content normal.        Judgment: Judgment normal.        Assessment/Plan: 1.  Type 2 diabetes mellitus with hyperglycemia, with long-term current use of insulin ([place]) Followed by endocrinology. Will need to discuss with Dr. Francoise Schaumann is additional steps need to be taken based on labs he performed recently. Will call office/look for visit note and discuss further at next visit if any further evaluation is needed on our part as PCP.  2. Essential hypertension BP on the low side in office today though pt has not had much water today and will hydrate and monitor BP closely. Per pt is it always high at home before taking his medicine. Continue Losartan and lasix as prescribed.   General Counseling: Lyam verbalizes understanding of the findings of todays visit and agrees with plan of treatment. I have discussed any further diagnostic evaluation that may be needed or ordered today. We also reviewed his medications today. he has been encouraged to call the office with any questions or concerns that should arise related to todays visit.    No orders of the defined types were placed in this encounter.   No orders of the defined types were placed in this encounter.   This patient was seen by Drema Dallas, PA-C in collaboration with Dr. Clayborn Bigness as a part of collaborative care agreement.   Total time spent:30 Minutes Time spent includes review of chart, medications, test results, and follow up plan with the patient.      Dr Lavera Guise Internal medicine

## 2020-12-20 DIAGNOSIS — Z23 Encounter for immunization: Secondary | ICD-10-CM | POA: Diagnosis not present

## 2021-02-08 ENCOUNTER — Ambulatory Visit: Payer: Medicare Other | Admitting: Nurse Practitioner

## 2021-02-19 ENCOUNTER — Telehealth: Payer: Self-pay | Admitting: Internal Medicine

## 2021-02-19 NOTE — Chronic Care Management (AMB) (Signed)
  Chronic Care Management   Outreach Note  02/19/2021 Name: Daniel Hodges MRN: [id number] DOB: 1941-04-24  Referred by: Lavera Guise, MD Reason for referral : No chief complaint on file.   An unsuccessful telephone outreach was attempted today. The patient was referred to the pharmacist for assistance with care management and care coordination.   Follow Up Plan:   Tatjana Dellinger Upstream Scheduler

## 2021-02-26 ENCOUNTER — Telehealth: Payer: Self-pay | Admitting: Internal Medicine

## 2021-02-26 NOTE — Chronic Care Management (AMB) (Signed)
  Chronic Care Management   Outreach Note  02/26/2021 Name: Daniel Hodges MRN: [id number] DOB: 04-25-1941  Referred by: Lavera Guise, MD Reason for referral : No chief complaint on file.   A second unsuccessful telephone outreach was attempted today. The patient was referred to pharmacist for assistance with care management and care coordination.  Follow Up Plan:   Tatjana Dellinger Upstream Scheduler

## 2021-03-14 ENCOUNTER — Telehealth: Payer: Self-pay | Admitting: Internal Medicine

## 2021-03-14 NOTE — Chronic Care Management (AMB) (Signed)
  Chronic Care Management   Outreach Note  03/14/2021 Name: Daniel Hodges MRN: [id number] DOB: 03-28-1941  Referred by: Lavera Guise, MD Reason for referral : No chief complaint on file.   Third unsuccessful telephone outreach was attempted today. The patient was referred to the pharmacist for assistance with care management and care coordination.   Follow Up Plan:   Tatjana Dellinger Upstream Scheduler

## 2021-03-19 ENCOUNTER — Encounter: Payer: Self-pay | Admitting: Nurse Practitioner

## 2021-03-19 ENCOUNTER — Ambulatory Visit (INDEPENDENT_AMBULATORY_CARE_PROVIDER_SITE_OTHER): Payer: Medicare Other | Admitting: Nurse Practitioner

## 2021-03-19 ENCOUNTER — Other Ambulatory Visit: Payer: Self-pay | Admitting: Internal Medicine

## 2021-03-19 ENCOUNTER — Other Ambulatory Visit: Payer: Self-pay

## 2021-03-19 VITALS — BP 128/70 | HR 65 | Temp 98.7°F | Resp 16 | Ht 66.0 in | Wt 166.2 lb

## 2021-03-19 DIAGNOSIS — Z862 Personal history of diseases of the blood and blood-forming organs and certain disorders involving the immune mechanism: Secondary | ICD-10-CM

## 2021-03-19 DIAGNOSIS — Z0001 Encounter for general adult medical examination with abnormal findings: Secondary | ICD-10-CM

## 2021-03-19 DIAGNOSIS — E782 Mixed hyperlipidemia: Secondary | ICD-10-CM

## 2021-03-19 DIAGNOSIS — J301 Allergic rhinitis due to pollen: Secondary | ICD-10-CM

## 2021-03-19 DIAGNOSIS — J4520 Mild intermittent asthma, uncomplicated: Secondary | ICD-10-CM

## 2021-03-19 DIAGNOSIS — R300 Dysuria: Secondary | ICD-10-CM

## 2021-03-19 DIAGNOSIS — E559 Vitamin D deficiency, unspecified: Secondary | ICD-10-CM

## 2021-03-19 DIAGNOSIS — H6121 Impacted cerumen, right ear: Secondary | ICD-10-CM

## 2021-03-19 DIAGNOSIS — E1165 Type 2 diabetes mellitus with hyperglycemia: Secondary | ICD-10-CM

## 2021-03-19 DIAGNOSIS — Z794 Long term (current) use of insulin: Secondary | ICD-10-CM

## 2021-03-19 DIAGNOSIS — G4733 Obstructive sleep apnea (adult) (pediatric): Secondary | ICD-10-CM

## 2021-03-19 DIAGNOSIS — I10 Essential (primary) hypertension: Secondary | ICD-10-CM

## 2021-03-19 LAB — POCT GLYCOSYLATED HEMOGLOBIN (HGB A1C): Hemoglobin A1C: 7 % — AB (ref 4.0–5.6)

## 2021-03-19 NOTE — Progress Notes (Signed)
[facility] [place], [place] [address]  Internal MEDICINE  Office Visit Note  Patient Name: Daniel Hodges  [id number]  [id number]  Date of Service: 03/19/2021  Chief Complaint  Patient presents with   Medicare Wellness    Pt has felt like his is going to past out when outside for awhile, bp has been okay, but concerned about glucose level   Diabetes    Endocrinology   Hypertension   Hyperlipidemia    HPI Daniel Hodges presents for an annual well visit and physical exam accompanied by his wife. he has a history of diabetes, asthma, sleep apnea, hyperlipidemia, and hypertension. His surgical history is significant for appendectomy, hernia repair and CABG. He is retired and lives at home with his wife. He denies any pain. He did report that he felt like he was going to pass out because of how hot it is outside and was worried about his glucose levels.  His A1C was 7.0 today which is improved from 7.2 in march 2021.  -His blood pressure is well controlled, 128/70 today. -He reports that his right ear feels stopped up.  He is overdue for a CPAP titration study, his last sleep study was in 2020.      Current Medication: Outpatient Encounter Medications as of 03/19/2021  Medication Sig Note   amLODipine (NORVASC) 2.5 MG tablet TAKE 1 TABLET BY MOUTH  DAILY    aspirin 81 MG tablet Take 81 mg by mouth daily.    chlorpheniramine-HYDROcodone (TUSSIONEX PENNKINETIC ER) 10-8 MG/5ML SUER Take 5 cc at night for cough    cholecalciferol (VITAMIN D3) 25 MCG (1000 UT) tablet Take 1,000 Units by mouth daily.    feeding supplement, GLUCERNA SHAKE, (GLUCERNA SHAKE) LIQD One a day    ferrous sulfate 324 MG TBEC Take by mouth.    furosemide (LASIX) 40 MG tablet TAKE ONE-HALF TABLET BY  MOUTH DAILY    gabapentin (NEURONTIN) 300 MG capsule TAKE 1 CAPSULE BY MOUTH  DAILY AND 1 CAPSULE BY  MOUTH AT NIGHT    glucose blood test strip     HUMALOG KWIKPEN 100 UNIT/ML KwikPen Inject  6-10 Units into the skin See admin instructions. Inject 6u under the skin daily at breakfast-time, 8u at lunch-time and inject 10u under the skin daily at dinner-time    hydrocortisone 1 % ointment Apply small amount mix with lotion at night for leg rash    loratadine (CLARITIN) 10 MG tablet Take 1 tablet (10 mg total) by mouth daily.    losartan (COZAAR) 25 MG tablet Take 25 mg by mouth daily.    Magnesium 250 MG TABS Take 250 mg by mouth 2 (two) times daily.    niacin (NIASPAN) 500 MG CR tablet niacin ER 500 mg tablet,extended release 24 hr    ONE TOUCH ULTRA TEST test strip USE THREE TIMES DAILY    TOUJEO SOLOSTAR 300 UNIT/ML SOPN Inject 25 Units into the skin at bedtime.     vitamin B-12 (CYANOCOBALAMIN) 1000 MCG tablet Take 1,000 mcg by mouth daily.    atorvastatin (LIPITOR) 20 MG tablet Take 1 tablet (20 mg total) by mouth at bedtime.    budesonide-formoterol (SYMBICORT) 160-4.5 MCG/ACT inhaler Inhale 2 puffs into the lungs 2 (two) times daily.    ipratropium-albuterol (DUONEB) 0.5-2.5 (3) MG/3ML SOLN USE 3 ML VIA NEBULIZER EVERY 6 HOURS AS NEEDED    mometasone (NASONEX) 50 MCG/ACT nasal spray Place 2 sprays into the nose daily.    montelukast (  SINGULAIR) 10 MG tablet Take 1 tablet (10 mg total) by mouth daily.    senna (SENOKOT) 8.6 MG tablet Take 1 tablet (8.6 mg total) by mouth daily.    simethicone (MYLICON) 80 MG chewable tablet Chew 2 tablets (160 mg total) by mouth 2 (two) times daily.    [DISCONTINUED] atorvastatin (LIPITOR) 20 MG tablet TAKE 1 TABLET BY MOUTH AT  BEDTIME    [DISCONTINUED] azithromycin (ZITHROMAX Z-PAK) 250 MG tablet Take two 250 mg tablets on day one followed by one 250 mg tablet each day for four days. (Patient not taking: Reported on 10/12/2020)    [DISCONTINUED] budesonide-formoterol (SYMBICORT) 160-4.5 MCG/ACT inhaler Inhale 2 puffs into the lungs 2 (two) times daily.     [DISCONTINUED] enalapril (VASOTEC) 2.5 MG tablet Take 1 tablet (2.5 mg total) by mouth  daily. (Patient not taking: No sig reported) 09/24/2020: Stopped by cardiology and put on cozaar 80m    [DISCONTINUED] enalapril (VASOTEC) 2.5 MG tablet Take 1 tablet (2.5 mg total) by mouth daily. (Patient not taking: Reported on 10/12/2020)    [DISCONTINUED] furosemide (LASIX) 40 MG tablet Take 0.5 tablets (20 mg total) by mouth daily.    [DISCONTINUED] furosemide (LASIX) 40 MG tablet Take 0.5 tablets (20 mg total) by mouth daily.    [DISCONTINUED] ipratropium-albuterol (DUONEB) 0.5-2.5 (3) MG/3ML SOLN USE 3 ML VIA NEBULIZER EVERY 6 HOURS AS NEEDED    [DISCONTINUED] mometasone (NASONEX) 50 MCG/ACT nasal spray Place 2 sprays into the nose daily.    [DISCONTINUED] montelukast (SINGULAIR) 10 MG tablet Take 1 tablet (10 mg total) by mouth daily.    [DISCONTINUED] montelukast (SINGULAIR) 10 MG tablet Take 1 tablet (10 mg total) by mouth daily.    [DISCONTINUED] Multiple Vitamin (MULTIVITAMIN WITH MINERALS) TABS tablet Take 1 tablet by mouth daily. (Patient not taking: Reported on 08/20/2020)    [DISCONTINUED] senna (SENOKOT) 8.6 MG tablet Take 1 tablet (8.6 mg total) by mouth daily.    [DISCONTINUED] simethicone (MYLICON) 80 MG chewable tablet Chew 2 tablets (160 mg total) by mouth 2 (two) times daily.    No facility-administered encounter medications on file as of 03/19/2021.    Surgical History: Past Surgical History:  Procedure Laterality Date   APPENDECTOMY     COLONOSCOPY WITH PROPOFOL N/A 06/11/2015   Procedure: COLONOSCOPY WITH PROPOFOL;  Surgeon: RManya Silvas MD;  [facility]  Service: Endoscopy;  Laterality: N/A;   CORONARY ARTERY BYPASS GRAFT     HERNIA REPAIR     TEE WITHOUT CARDIOVERSION     TRACHEOSTOMY     VASCULAR SURGERY      Medical History: Past Medical History:  Diagnosis Date   [place]    Asthma    Coronary artery disease    [place]    Hyperlipidemia    Hypertension    Sleep apnea     Family  History: Family History  Problem Relation Age of Onset   Cancer Sister    Diabetes Daughter    Diabetes Son     Social History   Socioeconomic History   Marital status: Married    Spouse name: Not on file   Number of children: Not on file   Years of education: Not on file   Highest education level: Not on file  Occupational History   Not on file  Tobacco Use   Smoking status: Never   Smokeless tobacco: Never  Vaping Use   Vaping Use: Never used  Substance and Sexual  Activity   Alcohol use: No   Drug use: No   Sexual activity: Not on file  Other Topics Concern   Not on file  Social History Narrative   Not on file   Social Determinants of Health   Financial Resource Strain: Not on file  Food Insecurity: Not on file  Transportation Needs: Not on file  Physical Activity: Not on file  Stress: Not on file  Social Connections: Not on file  Intimate Partner Violence: Not on file      Review of Systems  Constitutional:  Negative for activity change, appetite change, chills, fatigue, fever and unexpected weight change.  HENT: Negative.  Negative for congestion, ear pain, rhinorrhea, sore throat and trouble swallowing.   Eyes: Negative.   Respiratory: Negative.  Negative for cough, chest tightness, shortness of breath and wheezing.   Cardiovascular: Negative.  Negative for chest pain.  Gastrointestinal: Negative.  Negative for abdominal pain, blood in stool, constipation, diarrhea, nausea and vomiting.  Endocrine: Negative.   Genitourinary: Negative.  Negative for difficulty urinating, dysuria, frequency, hematuria and urgency.  Musculoskeletal: Negative.  Negative for arthralgias, back pain, joint swelling, myalgias and neck pain.  Skin: Negative.  Negative for rash and wound.  Allergic/Immunologic: Negative.  Negative for immunocompromised state.  Neurological: Negative.  Negative for dizziness, seizures, numbness and headaches.  Hematological: Negative.    Psychiatric/Behavioral: Negative.  Negative for behavioral problems, self-injury and suicidal ideas. The patient is not nervous/anxious.    Vital Signs: BP 128/70   Pulse 65   Temp 98.7 F (37.1 C)   Resp 16   Ht _0  (1.676 m)   Wt 166 lb 3.2 oz (75.4 kg)   SpO2 94%   BMI 26.83 kg/m    Physical Exam Vitals reviewed.  Constitutional:      General: He is not in acute distress.    Appearance: Normal appearance. He is not ill-appearing.  HENT:     Head: Normocephalic and atraumatic.     Right Ear: Tympanic membrane, ear canal and external ear normal.     Left Ear: Tympanic membrane, ear canal and external ear normal.     Nose: Nose normal. No congestion or rhinorrhea.     Mouth/Throat:     Mouth: Mucous membranes are moist.     Pharynx: Oropharynx is clear.  Eyes:     Conjunctiva/sclera: Conjunctivae normal.     Pupils: Pupils are equal, round, and reactive to light.  Cardiovascular:     Rate and Rhythm: Normal rate and regular rhythm.     Pulses: Normal pulses.     Heart sounds: Normal heart sounds. No murmur heard. Pulmonary:     Effort: Pulmonary effort is normal. No respiratory distress.     Breath sounds: Normal breath sounds. No wheezing.  Abdominal:     General: Bowel sounds are normal. There is no distension.     Palpations: Abdomen is soft. There is no mass.     Tenderness: There is no abdominal tenderness. There is no guarding or rebound.     Hernia: No hernia is present.  Musculoskeletal:        General: Normal range of motion.     Cervical back: Normal range of motion and neck supple.     Right lower leg: No edema.     Left lower leg: No edema.  Lymphadenopathy:     Cervical: No cervical adenopathy.  Skin:    General: Skin is warm and dry.  Capillary Refill: Capillary refill takes less than 2 seconds.  Neurological:     Mental Status: He is alert and oriented to person, place, and time.  Psychiatric:        Mood and Affect: Mood normal.         Behavior: Behavior normal.        Thought Content: Thought content normal.        Judgment: Judgment normal.       Assessment/Plan: 1. Encounter for general adult medical examination with abnormal findings Age-appropriate preventive screenings discussed, annual physical exam completed. Routine labs for health maintenance ordered. PHM updated.   2. Type 2 diabetes mellitus with hyperglycemia, with long-term current use of insulin (HCC) A1C is 7.0 today, continue current medications as prescribed. Labs ordered to check metabolic panel and thyroid. Recheck A1C in 3 months.  - POCT glycosylated hemoglobin (Hb A1C) - CMP14+EGFR - TSH + free T4  3. Mild intermittent asthma in adult without complication Stable, still uses duoneb treatments as needed, refill ordered.  - ipratropium-albuterol (DUONEB) 0.5-2.5 (3) MG/3ML SOLN; USE 3 ML VIA NEBULIZER EVERY 6 HOURS AS NEEDED  Dispense: 1080 mL; Refill: 1  4. Essential hypertension Stable with current medications.  5. OSA on CPAP Using CPAP at night, has not had a sleep study or CPAP titration in the past 2 years.  - Cpap titration; Future  6. Mixed hyperlipidemia Takes atorvastatin, recheck lipid panel. Will adjust medication accordingly if necessary.  - Lipid Profile  7. Non-seasonal allergic rhinitis due to pollen Stable, takes montelukast, refill ordered.  - montelukast (SINGULAIR) 10 MG tablet; Take 1 tablet (10 mg total) by mouth daily.  Dispense: 30 tablet; Refill: 5  8. Excessive cerumen in right ear canal Right ear was completely blocked with wax. Ear lavage done in office but unable to clear the entire piece of wax. Encouraged patient to purchase an ear cleaning kit and/or OTC ear drops that are used for softening ear wax and the rest of it should come out a lot easier. Patient agreeed with this plan.  - Ear Lavage  9. History of anemia Will recheck CBC to assess for anemia due to history.  - CBC with  Differential/Platelet  10. Vitamin D deficiency Check vitamin D level.  - Vitamin D (25 hydroxy)  11. Dysuria Routine urinalysis done - UA/M w/rflx Culture, Routine     General Counseling: Jaelynn verbalizes understanding of the findings of todays visit and agrees with plan of treatment. I have discussed any further diagnostic evaluation that may be needed or ordered today. We also reviewed his medications today. he has been encouraged to call the office with any questions or concerns that should arise related to todays visit.    Orders Placed This Encounter  Procedures   Microscopic Examination   UA/M w/rflx Culture, Routine   CBC with Differential/Platelet   CMP14+EGFR   Lipid Profile   TSH + free T4   Vitamin D (25 hydroxy)   POCT glycosylated hemoglobin (Hb A1C)   Cpap titration   Ear Lavage    Meds ordered this encounter  Medications   atorvastatin (LIPITOR) 20 MG tablet    Sig: Take 1 tablet (20 mg total) by mouth at bedtime.    Dispense:  90 tablet    Refill:  3    Requesting 1 year supply   ipratropium-albuterol (DUONEB) 0.5-2.5 (3) MG/3ML SOLN    Sig: USE 3 ML VIA NEBULIZER EVERY 6 HOURS AS NEEDED  Dispense:  1080 mL    Refill:  1    **Patient requests 90 days supply**   senna (SENOKOT) 8.6 MG tablet    Sig: Take 1 tablet (8.6 mg total) by mouth daily.    Dispense:  90 tablet    Refill:  1   montelukast (SINGULAIR) 10 MG tablet    Sig: Take 1 tablet (10 mg total) by mouth daily.    Dispense:  30 tablet    Refill:  5   simethicone (MYLICON) 80 MG chewable tablet    Sig: Chew 2 tablets (160 mg total) by mouth 2 (two) times daily.    Dispense:  90 tablet    Refill:  1   budesonide-formoterol (SYMBICORT) 160-4.5 MCG/ACT inhaler    Sig: Inhale 2 puffs into the lungs 2 (two) times daily.    Dispense:  1 each    Refill:  3   mometasone (NASONEX) 50 MCG/ACT nasal spray    Sig: Place 2 sprays into the nose daily.    Dispense:  17 g    Refill:  11     Return in about 3 months (around 06/19/2021) for Recheck A1C, Rawleigh Rode PCP.   Total time spent:30 Minutes Time spent includes review of chart, medications, test results, and follow up plan with the patient.   [place] Controlled Substance Database was reviewed by me.  This patient was seen by Jonetta Osgood, FNP-C in collaboration with Dr. Clayborn Bigness as a part of collaborative care agreement.  Kahliya Fraleigh R. Valetta Fuller, MSN, FNP-C Internal medicine

## 2021-03-20 LAB — UA/M W/RFLX CULTURE, ROUTINE
Bilirubin, UA: NEGATIVE
Glucose, UA: NEGATIVE
Ketones, UA: NEGATIVE
Leukocytes,UA: NEGATIVE
Nitrite, UA: NEGATIVE
Protein,UA: NEGATIVE
RBC, UA: NEGATIVE
Specific Gravity, UA: 1.015 (ref 1.005–1.030)
Urobilinogen, Ur: 0.2 mg/dL (ref 0.2–1.0)
pH, UA: 6.5 (ref 5.0–7.5)

## 2021-03-20 LAB — MICROSCOPIC EXAMINATION
Bacteria, UA: NONE SEEN
Casts: NONE SEEN /lpf
Epithelial Cells (non renal): NONE SEEN /hpf (ref 0–10)
RBC, Urine: NONE SEEN /hpf (ref 0–2)
WBC, UA: NONE SEEN /hpf (ref 0–5)

## 2021-03-21 MED ORDER — BUDESONIDE-FORMOTEROL FUMARATE 160-4.5 MCG/ACT IN AERO: 2.0000 | INHALATION_SPRAY | Freq: Two times a day (BID) | RESPIRATORY_TRACT | 3 refills | Status: DC

## 2021-03-21 MED ORDER — SENNOSIDES 8.6 MG PO TABS
1.0000 | ORAL_TABLET | Freq: Every day | ORAL | 1 refills | Status: AC
Start: 2021-03-21 — End: ?

## 2021-03-21 MED ORDER — MOMETASONE FUROATE 50 MCG/ACT NA SUSP: 2.0000 | Freq: Every day | NASAL | 11 refills | Status: DC

## 2021-03-21 MED ORDER — IPRATROPIUM-ALBUTEROL 0.5-2.5 (3) MG/3ML IN SOLN: RESPIRATORY_TRACT | 1 refills | Status: DC

## 2021-03-21 MED ORDER — ATORVASTATIN CALCIUM 20 MG PO TABS: 20.0000 mg | ORAL_TABLET | Freq: Every day | ORAL | 3 refills | Status: DC

## 2021-03-21 MED ORDER — MONTELUKAST SODIUM 10 MG PO TABS: 10.0000 mg | ORAL_TABLET | Freq: Every day | ORAL | 5 refills | Status: DC

## 2021-03-21 MED ORDER — SIMETHICONE 80 MG PO CHEW
160.0000 mg | CHEWABLE_TABLET | Freq: Two times a day (BID) | ORAL | 1 refills | Status: DC
Start: 2021-03-21 — End: 2021-06-20

## 2021-05-16 DIAGNOSIS — I10 Essential (primary) hypertension: Secondary | ICD-10-CM | POA: Diagnosis not present

## 2021-05-16 DIAGNOSIS — E7800 Pure hypercholesterolemia, unspecified: Secondary | ICD-10-CM | POA: Diagnosis not present

## 2021-05-16 DIAGNOSIS — I2510 Atherosclerotic heart disease of native coronary artery without angina pectoris: Secondary | ICD-10-CM | POA: Diagnosis not present

## 2021-05-16 DIAGNOSIS — E1165 Type 2 diabetes mellitus with hyperglycemia: Secondary | ICD-10-CM | POA: Diagnosis not present

## 2021-05-23 DIAGNOSIS — E1165 Type 2 diabetes mellitus with hyperglycemia: Secondary | ICD-10-CM | POA: Diagnosis not present

## 2021-05-23 DIAGNOSIS — I10 Essential (primary) hypertension: Secondary | ICD-10-CM | POA: Diagnosis not present

## 2021-05-23 DIAGNOSIS — E7800 Pure hypercholesterolemia, unspecified: Secondary | ICD-10-CM | POA: Diagnosis not present

## 2021-05-23 DIAGNOSIS — E0842 Diabetes mellitus due to underlying condition with diabetic polyneuropathy: Secondary | ICD-10-CM | POA: Diagnosis not present

## 2021-05-23 DIAGNOSIS — E0821 Diabetes mellitus due to underlying condition with diabetic nephropathy: Secondary | ICD-10-CM | POA: Diagnosis not present

## 2021-05-23 DIAGNOSIS — E083499 Diabetes mellitus due to underlying condition with severe nonproliferative diabetic retinopathy without macular edema, unspecified eye: Secondary | ICD-10-CM | POA: Diagnosis not present

## 2021-06-05 ENCOUNTER — Other Ambulatory Visit: Payer: Self-pay

## 2021-06-05 DIAGNOSIS — E559 Vitamin D deficiency, unspecified: Secondary | ICD-10-CM

## 2021-06-05 DIAGNOSIS — G4733 Obstructive sleep apnea (adult) (pediatric): Secondary | ICD-10-CM

## 2021-06-05 DIAGNOSIS — J301 Allergic rhinitis due to pollen: Secondary | ICD-10-CM

## 2021-06-05 DIAGNOSIS — R300 Dysuria: Secondary | ICD-10-CM

## 2021-06-05 DIAGNOSIS — Z9989 Dependence on other enabling machines and devices: Secondary | ICD-10-CM

## 2021-06-05 DIAGNOSIS — H6121 Impacted cerumen, right ear: Secondary | ICD-10-CM

## 2021-06-05 DIAGNOSIS — I10 Essential (primary) hypertension: Secondary | ICD-10-CM

## 2021-06-05 DIAGNOSIS — Z862 Personal history of diseases of the blood and blood-forming organs and certain disorders involving the immune mechanism: Secondary | ICD-10-CM

## 2021-06-05 DIAGNOSIS — E1165 Type 2 diabetes mellitus with hyperglycemia: Secondary | ICD-10-CM

## 2021-06-05 DIAGNOSIS — J4520 Mild intermittent asthma, uncomplicated: Secondary | ICD-10-CM

## 2021-06-05 DIAGNOSIS — Z0001 Encounter for general adult medical examination with abnormal findings: Secondary | ICD-10-CM

## 2021-06-05 DIAGNOSIS — E782 Mixed hyperlipidemia: Secondary | ICD-10-CM

## 2021-06-05 MED ORDER — GABAPENTIN 300 MG PO CAPS: ORAL_CAPSULE | ORAL | 3 refills | Status: DC

## 2021-06-05 MED ORDER — MONTELUKAST SODIUM 10 MG PO TABS: 10.0000 mg | ORAL_TABLET | Freq: Every day | ORAL | 1 refills | Status: DC

## 2021-06-20 ENCOUNTER — Encounter (INDEPENDENT_AMBULATORY_CARE_PROVIDER_SITE_OTHER): Payer: Self-pay

## 2021-06-20 ENCOUNTER — Other Ambulatory Visit: Payer: Self-pay

## 2021-06-20 ENCOUNTER — Encounter: Payer: Self-pay | Admitting: Nurse Practitioner

## 2021-06-20 ENCOUNTER — Ambulatory Visit (INDEPENDENT_AMBULATORY_CARE_PROVIDER_SITE_OTHER): Payer: Medicare Other | Admitting: Nurse Practitioner

## 2021-06-20 VITALS — BP 140/70 | HR 71 | Temp 97.8°F | Resp 16 | Ht 66.0 in | Wt 168.0 lb

## 2021-06-20 DIAGNOSIS — G4733 Obstructive sleep apnea (adult) (pediatric): Secondary | ICD-10-CM

## 2021-06-20 DIAGNOSIS — J301 Allergic rhinitis due to pollen: Secondary | ICD-10-CM

## 2021-06-20 DIAGNOSIS — Z794 Long term (current) use of insulin: Secondary | ICD-10-CM | POA: Diagnosis not present

## 2021-06-20 DIAGNOSIS — J4520 Mild intermittent asthma, uncomplicated: Secondary | ICD-10-CM

## 2021-06-20 DIAGNOSIS — I10 Essential (primary) hypertension: Secondary | ICD-10-CM

## 2021-06-20 DIAGNOSIS — E782 Mixed hyperlipidemia: Secondary | ICD-10-CM

## 2021-06-20 DIAGNOSIS — E1165 Type 2 diabetes mellitus with hyperglycemia: Secondary | ICD-10-CM

## 2021-06-20 DIAGNOSIS — Z0001 Encounter for general adult medical examination with abnormal findings: Secondary | ICD-10-CM

## 2021-06-20 DIAGNOSIS — R300 Dysuria: Secondary | ICD-10-CM

## 2021-06-20 DIAGNOSIS — K219 Gastro-esophageal reflux disease without esophagitis: Secondary | ICD-10-CM | POA: Diagnosis not present

## 2021-06-20 DIAGNOSIS — Z862 Personal history of diseases of the blood and blood-forming organs and certain disorders involving the immune mechanism: Secondary | ICD-10-CM

## 2021-06-20 DIAGNOSIS — H6121 Impacted cerumen, right ear: Secondary | ICD-10-CM

## 2021-06-20 DIAGNOSIS — E559 Vitamin D deficiency, unspecified: Secondary | ICD-10-CM

## 2021-06-20 MED ORDER — SIMETHICONE 80 MG PO CHEW: 160.0000 mg | CHEWABLE_TABLET | Freq: Two times a day (BID) | ORAL | 1 refills | Status: AC

## 2021-06-20 MED ORDER — GLUCOSE BLOOD VI STRP: 1.0000 | ORAL_STRIP | Freq: Four times a day (QID) | 12 refills | Status: DC

## 2021-06-20 NOTE — Progress Notes (Signed)
[facility] [place], [place] [address]  Internal MEDICINE  Office Visit Note  Patient Name: Daniel Hodges  [id number]  [id number]  Date of Service: 06/20/2021  Chief Complaint  Patient presents with   Follow-up   Hyperlipidemia   Hypertension    HPI Daniel Hodges presents for a follow up visit. He sees Dr. Truddie Coco for diabetes and his last A1C was 7.0. He had a recent syncopal episode a couple of days ago. His wife did not take him to the ER or call his cardiologist. He is currently asymptomatic. He denies any chest pain, SOB, neck, arm jaw pain or numbness, or nausea.     Current Medication: Outpatient Encounter Medications as of 06/20/2021  Medication Sig   amLODipine (NORVASC) 2.5 MG tablet TAKE 1 TABLET BY MOUTH  DAILY   aspirin 81 MG tablet Take 81 mg by mouth daily.   atorvastatin (LIPITOR) 20 MG tablet Take 1 tablet (20 mg total) by mouth at bedtime.   chlorpheniramine-HYDROcodone (TUSSIONEX PENNKINETIC ER) 10-8 MG/5ML SUER Take 5 cc at night for cough   cholecalciferol (VITAMIN D3) 25 MCG (1000 UT) tablet Take 1,000 Units by mouth daily.   feeding supplement, GLUCERNA SHAKE, (GLUCERNA SHAKE) LIQD One a day   ferrous sulfate 324 MG TBEC Take by mouth.   furosemide (LASIX) 40 MG tablet TAKE ONE-HALF TABLET BY  MOUTH DAILY   gabapentin (NEURONTIN) 300 MG capsule TAKE 1 CAPSULE BY MOUTH  DAILY AND 1 CAPSULE BY  MOUTH AT NIGHT   glucose blood test strip 1 each by Other route in the morning, at noon, in the evening, and at bedtime. Use as instructed   HUMALOG KWIKPEN 100 UNIT/ML KwikPen Inject 6-10 Units into the skin See admin instructions. Inject 6u under the skin daily at breakfast-time, 8u at lunch-time and inject 10u under the skin daily at dinner-time   hydrocortisone 1 % ointment Apply small amount mix with lotion at night for leg rash   ipratropium-albuterol (DUONEB) 0.5-2.5 (3) MG/3ML SOLN USE 3 ML VIA NEBULIZER EVERY 6 HOURS AS NEEDED    loratadine (CLARITIN) 10 MG tablet Take 1 tablet (10 mg total) by mouth daily.   losartan (COZAAR) 25 MG tablet Take 25 mg by mouth daily.   Magnesium 250 MG TABS Take 250 mg by mouth 2 (two) times daily.   mometasone (NASONEX) 50 MCG/ACT nasal spray Place 2 sprays into the nose daily.   montelukast (SINGULAIR) 10 MG tablet Take 1 tablet (10 mg total) by mouth daily.   niacin (NIASPAN) 500 MG CR tablet niacin ER 500 mg tablet,extended release 24 hr   senna (SENOKOT) 8.6 MG tablet Take 1 tablet (8.6 mg total) by mouth daily.   TOUJEO SOLOSTAR 300 UNIT/ML SOPN Inject 25 Units into the skin at bedtime.    vitamin B-12 (CYANOCOBALAMIN) 1000 MCG tablet Take 1,000 mcg by mouth daily.   [DISCONTINUED] budesonide-formoterol (SYMBICORT) 160-4.5 MCG/ACT inhaler Inhale 2 puffs into the lungs 2 (two) times daily.   [DISCONTINUED] glucose blood test strip    [DISCONTINUED] ONE TOUCH ULTRA TEST test strip USE THREE TIMES DAILY   [DISCONTINUED] simethicone (MYLICON) 80 MG chewable tablet Chew 2 tablets (160 mg total) by mouth 2 (two) times daily.   simethicone (MYLICON) 80 MG chewable tablet Chew 2 tablets (160 mg total) by mouth 2 (two) times daily.   No facility-administered encounter medications on file as of 06/20/2021.    Surgical History: Past Surgical History:  Procedure Laterality Date  APPENDECTOMY     COLONOSCOPY WITH PROPOFOL N/A 06/11/2015   Procedure: COLONOSCOPY WITH PROPOFOL;  Surgeon: Manya Silvas, MD;  Location: [facility] ENDOSCOPY;  Service: Endoscopy;  Laterality: N/A;   CORONARY ARTERY BYPASS GRAFT     HERNIA REPAIR     TEE WITHOUT CARDIOVERSION     TRACHEOSTOMY     VASCULAR SURGERY      Medical History: Past Medical History:  Diagnosis Date   Anginal pain ([place])    Asthma    Coronary artery disease    Diabetes mellitus without complication ([place])    Hyperlipidemia    Hypertension    Sleep apnea     Family History: Family History  Problem Relation Age of Onset   Cancer  Sister    Diabetes Daughter    Diabetes Son     Social History   Socioeconomic History   Marital status: Married    Spouse name: Not on file   Number of children: Not on file   Years of education: Not on file   Highest education level: Not on file  Occupational History   Not on file  Tobacco Use   Smoking status: Never   Smokeless tobacco: Never  Vaping Use   Vaping Use: Never used  Substance and Sexual Activity   Alcohol use: No   Drug use: No   Sexual activity: Not on file  Other Topics Concern   Not on file  Social History Narrative   Not on file   Social Determinants of Health   Financial Resource Strain: Not on file  Food Insecurity: Not on file  Transportation Needs: Not on file  Physical Activity: Not on file  Stress: Not on file  Social Connections: Not on file  Intimate Partner Violence: Not on file      Review of Systems  Constitutional:  Negative for chills, fatigue and unexpected weight change.  HENT:  Negative for congestion, rhinorrhea, sneezing and sore throat.   Eyes:  Negative for redness.  Respiratory:  Negative for cough, chest tightness and shortness of breath.   Cardiovascular:  Negative for chest pain and palpitations.  Gastrointestinal:  Negative for abdominal pain, constipation, diarrhea, nausea and vomiting.  Genitourinary:  Negative for dysuria and frequency.  Musculoskeletal:  Negative for arthralgias, back pain, joint swelling and neck pain.  Skin:  Negative for rash.  Neurological: Negative.  Negative for tremors and numbness.  Hematological:  Negative for adenopathy. Does not bruise/bleed easily.  Psychiatric/Behavioral:  Negative for behavioral problems (Depression), sleep disturbance and suicidal ideas. The patient is not nervous/anxious.    Vital Signs: BP 140/70   Pulse 71   Temp 97.8 F (36.6 C)   Resp 16   Ht 5\' 6"  (1.676 m)   Wt 168 lb (76.2 kg)   SpO2 99%   BMI 27.12 kg/m    Physical Exam Vitals reviewed.   Constitutional:      Appearance: Normal appearance. He is normal weight.  HENT:     Head: Normocephalic and atraumatic.  Eyes:     Extraocular Movements: Extraocular movements intact.     Pupils: Pupils are equal, round, and reactive to light.  Cardiovascular:     Rate and Rhythm: Normal rate and regular rhythm.  Pulmonary:     Effort: Pulmonary effort is normal. No respiratory distress.  Neurological:     Mental Status: He is alert and oriented to person, place, and time.     Cranial Nerves: No cranial nerve deficit.  Coordination: Coordination normal.     Gait: Gait normal.  Psychiatric:        Mood and Affect: Mood normal.        Behavior: Behavior normal.       Assessment/Plan: 1. Type 2 diabetes mellitus with hyperglycemia, with long-term current use of insulin (HCC) Refill of strips ordered as patient ran out and the pharmacy was unable to get a prescription order from his endocrinologist. Episode of feeling faint and weak may have been due to low glucose levels. Encouraged patient to check his glucose level the moment he starts to feel this way so he can correct the low glucose sooner.  - glucose blood test strip; 1 each by Other route in the morning, at noon, in the evening, and at bedtime. Use as instructed  Dispense: 400 each; Refill: 12  2. Gastroesophageal reflux disease without esophagitis Stable, refill ordered.  - simethicone (MYLICON) 80 MG chewable tablet; Chew 2 tablets (160 mg total) by mouth 2 (two) times daily.  Dispense: 90 tablet; Refill: 1   General Counseling: Chaske verbalizes understanding of the findings of todays visit and agrees with plan of treatment. I have discussed any further diagnostic evaluation that may be needed or ordered today. We also reviewed his medications today. he has been encouraged to call the office with any questions or concerns that should arise related to todays visit.    No orders of the defined types were placed in  this encounter.   Meds ordered this encounter  Medications   glucose blood test strip    Sig: 1 each by Other route in the morning, at noon, in the evening, and at bedtime. Use as instructed    Dispense:  400 each    Refill:  12   simethicone (MYLICON) 80 MG chewable tablet    Sig: Chew 2 tablets (160 mg total) by mouth 2 (two) times daily.    Dispense:  90 tablet    Refill:  1    Return in about 3 months (around 09/20/2021) for F/U, med refill, Braycen Burandt PCP.   Total time spent:20 Minutes Time spent includes review of chart, medications, test results, and follow up plan with the patient.   [place] Controlled Substance Database was reviewed by me.  This patient was seen by Jonetta Osgood, FNP-C in collaboration with Dr. Clayborn Bigness as a part of collaborative care agreement.   Gasper Hopes R. Valetta Fuller, MSN, FNP-C Internal medicine

## 2021-07-01 ENCOUNTER — Telehealth (INDEPENDENT_AMBULATORY_CARE_PROVIDER_SITE_OTHER): Payer: Medicare Other | Admitting: Physician Assistant

## 2021-07-01 ENCOUNTER — Encounter: Payer: Self-pay | Admitting: Physician Assistant

## 2021-07-01 ENCOUNTER — Ambulatory Visit: Payer: Medicare Other | Admitting: Physician Assistant

## 2021-07-01 ENCOUNTER — Other Ambulatory Visit: Payer: Self-pay

## 2021-07-01 VITALS — Ht 66.0 in | Wt 165.0 lb

## 2021-07-01 DIAGNOSIS — J0100 Acute maxillary sinusitis, unspecified: Secondary | ICD-10-CM

## 2021-07-01 NOTE — Progress Notes (Signed)
[facility] [place], [place] [address]  Internal MEDICINE  Telephone Visit  Patient Name: Daniel Hodges  [id number]  [id number]  Date of Service: 07/04/2021  I connected with the patient at 1:58 by telephone and verified the patients identity using two identifiers.   I discussed the limitations, risks, security and privacy concerns of performing an evaluation and management service by telephone and the availability of in person appointments. I also discussed with the patient that there may be a patient responsible charge related to the service.  The patient expressed understanding and agrees to proceed.    Chief Complaint  Patient presents with   Telephone Screen    [id number]   Telephone Assessment    Home covid test is negative    Cough    Clear mucus     HPI Pt is here for virtual sick visit -he has been having a productive cough and sinus congestion since yesterday -Denies wheezing, SOB , fatigue, body aches, fever, or chills -Uses inhaler as prescribed -He took a home covid test which was negative -Has not tried anything for it. Advised to try mucinex and flonase and hold off on any ABX due to short duration, unless not getting better by end of week  Current Medication: Outpatient Encounter Medications as of 07/01/2021  Medication Sig   amLODipine (NORVASC) 2.5 MG tablet TAKE 1 TABLET BY MOUTH  DAILY   aspirin 81 MG tablet Take 81 mg by mouth daily.   atorvastatin (LIPITOR) 20 MG tablet Take 1 tablet (20 mg total) by mouth at bedtime.   budesonide-formoterol (SYMBICORT) 160-4.5 MCG/ACT inhaler Inhale 2 puffs into the lungs 2 (two) times daily.   chlorpheniramine-HYDROcodone (TUSSIONEX PENNKINETIC ER) 10-8 MG/5ML SUER Take 5 cc at night for cough   cholecalciferol (VITAMIN D3) 25 MCG (1000 UT) tablet Take 1,000 Units by mouth daily.   feeding supplement, GLUCERNA SHAKE, (GLUCERNA SHAKE) LIQD One a day   ferrous sulfate 324 MG TBEC Take by  mouth.   furosemide (LASIX) 40 MG tablet TAKE ONE-HALF TABLET BY  MOUTH DAILY   gabapentin (NEURONTIN) 300 MG capsule TAKE 1 CAPSULE BY MOUTH  DAILY AND 1 CAPSULE BY  MOUTH AT NIGHT   glucose blood test strip 1 each by Other route in the morning, at noon, in the evening, and at bedtime. Use as instructed   HUMALOG KWIKPEN 100 UNIT/ML KwikPen Inject 6-10 Units into the skin See admin instructions. Inject 6u under the skin daily at breakfast-time, 8u at lunch-time and inject 10u under the skin daily at dinner-time   hydrocortisone 1 % ointment Apply small amount mix with lotion at night for leg rash   ipratropium-albuterol (DUONEB) 0.5-2.5 (3) MG/3ML SOLN USE 3 ML VIA NEBULIZER EVERY 6 HOURS AS NEEDED   loratadine (CLARITIN) 10 MG tablet Take 1 tablet (10 mg total) by mouth daily.   losartan (COZAAR) 25 MG tablet Take 25 mg by mouth daily.   Magnesium 250 MG TABS Take 250 mg by mouth 2 (two) times daily.   mometasone (NASONEX) 50 MCG/ACT nasal spray Place 2 sprays into the nose daily.   montelukast (SINGULAIR) 10 MG tablet Take 1 tablet (10 mg total) by mouth daily.   niacin (NIASPAN) 500 MG CR tablet niacin ER 500 mg tablet,extended release 24 hr   senna (SENOKOT) 8.6 MG tablet Take 1 tablet (8.6 mg total) by mouth daily.   simethicone (MYLICON) 80 MG chewable tablet Chew 2 tablets (160 mg total) by mouth  2 (two) times daily.   TOUJEO SOLOSTAR 300 UNIT/ML SOPN Inject 25 Units into the skin at bedtime.    vitamin B-12 (CYANOCOBALAMIN) 1000 MCG tablet Take 1,000 mcg by mouth daily.   No facility-administered encounter medications on file as of 07/01/2021.    Surgical History: Past Surgical History:  Procedure Laterality Date   APPENDECTOMY     COLONOSCOPY WITH PROPOFOL N/A 06/11/2015   Procedure: COLONOSCOPY WITH PROPOFOL;  Surgeon: Manya Silvas, MD;  Location: [facility] ENDOSCOPY;  Service: Endoscopy;  Laterality: N/A;   CORONARY ARTERY BYPASS GRAFT     HERNIA REPAIR     TEE WITHOUT  CARDIOVERSION     TRACHEOSTOMY     VASCULAR SURGERY      Medical History: Past Medical History:  Diagnosis Date   Anginal pain ([place])    Asthma    Coronary artery disease    Diabetes mellitus without complication ([place])    Hyperlipidemia    Hypertension    Sleep apnea     Family History: Family History  Problem Relation Age of Onset   Cancer Sister    Diabetes Daughter    Diabetes Son     Social History   Socioeconomic History   Marital status: Married    Spouse name: Not on file   Number of children: Not on file   Years of education: Not on file   Highest education level: Not on file  Occupational History   Not on file  Tobacco Use   Smoking status: Never   Smokeless tobacco: Never  Vaping Use   Vaping Use: Never used  Substance and Sexual Activity   Alcohol use: No   Drug use: No   Sexual activity: Not on file  Other Topics Concern   Not on file  Social History Narrative   Not on file   Social Determinants of Health   Financial Resource Strain: Not on file  Food Insecurity: Not on file  Transportation Needs: Not on file  Physical Activity: Not on file  Stress: Not on file  Social Connections: Not on file  Intimate Partner Violence: Not on file      Review of Systems  Constitutional:  Negative for fatigue and fever.  HENT:  Positive for congestion and postnasal drip. Negative for mouth sores.   Respiratory:  Positive for cough. Negative for shortness of breath and wheezing.   Cardiovascular:  Negative for chest pain.  Genitourinary:  Negative for flank pain.  Psychiatric/Behavioral: Negative.     Vital Signs: Ht 5\' 6"  (1.676 m)   Wt 165 lb (74.8 kg)   BMI 26.63 kg/m    Observation/Objective:  Pt is able to carry out conversation   Assessment/Plan: 1. Acute non-recurrent maxillary sinusitis Will start on mucinex and flonase. Discussed since symptoms just began and covid test negative, will start with conservative management. Pt may  call if worsening/not improving as he may need ABX. Pt also instructed to use inhalers as prescribed and as indicated   General Counseling: Daniel Hodges verbalizes understanding of the findings of today's phone visit and agrees with plan of treatment. I have discussed any further diagnostic evaluation that may be needed or ordered today. We also reviewed his medications today. he has been encouraged to call the office with any questions or concerns that should arise related to todays visit.    No orders of the defined types were placed in this encounter.   No orders of the defined types were placed in this encounter.  Time spent:25 Minutes    Dr Lavera Guise Internal medicine

## 2021-07-05 ENCOUNTER — Telehealth: Payer: Self-pay

## 2021-07-05 DIAGNOSIS — E1165 Type 2 diabetes mellitus with hyperglycemia: Secondary | ICD-10-CM

## 2021-07-05 DIAGNOSIS — R300 Dysuria: Secondary | ICD-10-CM

## 2021-07-05 DIAGNOSIS — Z9989 Dependence on other enabling machines and devices: Secondary | ICD-10-CM

## 2021-07-05 DIAGNOSIS — E559 Vitamin D deficiency, unspecified: Secondary | ICD-10-CM

## 2021-07-05 DIAGNOSIS — Z0001 Encounter for general adult medical examination with abnormal findings: Secondary | ICD-10-CM

## 2021-07-05 DIAGNOSIS — E782 Mixed hyperlipidemia: Secondary | ICD-10-CM

## 2021-07-05 DIAGNOSIS — G4733 Obstructive sleep apnea (adult) (pediatric): Secondary | ICD-10-CM

## 2021-07-05 DIAGNOSIS — J4520 Mild intermittent asthma, uncomplicated: Secondary | ICD-10-CM

## 2021-07-05 DIAGNOSIS — I10 Essential (primary) hypertension: Secondary | ICD-10-CM

## 2021-07-05 DIAGNOSIS — H6121 Impacted cerumen, right ear: Secondary | ICD-10-CM

## 2021-07-05 DIAGNOSIS — Z794 Long term (current) use of insulin: Secondary | ICD-10-CM

## 2021-07-05 DIAGNOSIS — J301 Allergic rhinitis due to pollen: Secondary | ICD-10-CM

## 2021-07-05 DIAGNOSIS — Z862 Personal history of diseases of the blood and blood-forming organs and certain disorders involving the immune mechanism: Secondary | ICD-10-CM

## 2021-07-05 MED ORDER — BENZONATATE 100 MG PO CAPS
100.0000 mg | ORAL_CAPSULE | Freq: Two times a day (BID) | ORAL | 0 refills | Status: DC | PRN
Start: 2021-07-05 — End: 2022-03-21

## 2021-07-05 MED ORDER — AZITHROMYCIN 250 MG PO TABS: ORAL_TABLET | ORAL | 0 refills | Status: DC

## 2021-07-05 MED ORDER — BUDESONIDE-FORMOTEROL FUMARATE 160-4.5 MCG/ACT IN AERO: 2.0000 | INHALATION_SPRAY | Freq: Two times a day (BID) | RESPIRATORY_TRACT | 3 refills | Status: DC

## 2021-07-05 NOTE — Telephone Encounter (Signed)
Pt's wife called and advised pt still coughing bad, thick white mucus, sent refill on symbicort, azithromycin and benzonatate, also advised to take mucinex also

## 2021-07-16 ENCOUNTER — Telehealth: Payer: Self-pay | Admitting: Internal Medicine

## 2021-07-16 NOTE — Chronic Care Management (AMB) (Signed)
  Chronic Care Management   Outreach Note  07/16/2021 Name: Daniel Hodges MRN: [id number] DOB: [birth year]  Referred by: Lavera Guise, MD Reason for referral : No chief complaint on file.   An unsuccessful telephone outreach was attempted today. The patient was referred to the pharmacist for assistance with care management and care coordination.   Follow Up Plan:   Tatjana Dellinger Upstream Scheduler

## 2021-09-19 ENCOUNTER — Telehealth: Payer: Self-pay

## 2021-09-19 ENCOUNTER — Ambulatory Visit (INDEPENDENT_AMBULATORY_CARE_PROVIDER_SITE_OTHER): Payer: Medicare Other | Admitting: Nurse Practitioner

## 2021-09-19 ENCOUNTER — Encounter: Payer: Self-pay | Admitting: Nurse Practitioner

## 2021-09-19 ENCOUNTER — Other Ambulatory Visit: Payer: Self-pay

## 2021-09-19 VITALS — BP 110/70 | HR 75 | Temp 98.2°F | Resp 16 | Ht 66.0 in | Wt 163.4 lb

## 2021-09-19 DIAGNOSIS — Z794 Long term (current) use of insulin: Secondary | ICD-10-CM

## 2021-09-19 DIAGNOSIS — G4733 Obstructive sleep apnea (adult) (pediatric): Secondary | ICD-10-CM

## 2021-09-19 DIAGNOSIS — J4531 Mild persistent asthma with (acute) exacerbation: Secondary | ICD-10-CM | POA: Diagnosis not present

## 2021-09-19 DIAGNOSIS — E1165 Type 2 diabetes mellitus with hyperglycemia: Secondary | ICD-10-CM

## 2021-09-19 DIAGNOSIS — Z9989 Dependence on other enabling machines and devices: Secondary | ICD-10-CM

## 2021-09-19 DIAGNOSIS — Z23 Encounter for immunization: Secondary | ICD-10-CM

## 2021-09-19 LAB — POCT GLYCOSYLATED HEMOGLOBIN (HGB A1C): Hemoglobin A1C: 7.9 % — AB (ref 4.0–5.6)

## 2021-09-19 MED ORDER — ZOSTER VAC RECOMB ADJUVANTED 50 MCG/0.5ML IM SUSR: 0.5000 mL | Freq: Once | INTRAMUSCULAR | 0 refills | Status: AC

## 2021-09-19 MED ORDER — FREESTYLE LIBRE 2 READER DEVI: 1.0000 | Freq: Once | 0 refills | Status: DC

## 2021-09-19 MED ORDER — PNEUMOCOCCAL 20-VAL CONJ VACC 0.5 ML IM SUSY: 0.5000 mL | PREFILLED_SYRINGE | INTRAMUSCULAR | 0 refills | Status: AC

## 2021-09-19 MED ORDER — FREESTYLE LIBRE 2 SENSOR MISC: 1.0000 | 5 refills | Status: DC

## 2021-09-19 MED ORDER — HYDROCOD POLST-CPM POLST ER 10-8 MG/5ML PO SUER: ORAL | 0 refills | Status: DC

## 2021-09-19 NOTE — Progress Notes (Signed)
[facility] [place], [place] [address]  Internal MEDICINE  Office Visit Note  Patient Name: Daniel Hodges  [id number]  [id number]  Date of Service: 09/19/2021  Chief Complaint  Patient presents with   Follow-up   Medication Refill   Diabetes   Hyperlipidemia   Hypertension   Asthma    HPI Daniel Hodges presents for a follow up visit for diabetes, hypertension and OSA. His A1C is elevated at 7.9 which is an increase from 7.0 in July 2022. When discussing medication adjustment, patient and wife are not willing to change any of his medications. He insists on continuing to work on diet and lifestyle modifications.  His blood pressure remains well controlled with current medications.  His wife, who accompanied him to the office visit, stated that he has an old CPAP machine that is at least 81 years old and he needs a  new machine. They would like one ordered for him.  Patient wants freestyle libre to be able to check his glucose levels easier.   Current Medication: Outpatient Encounter Medications as of 09/19/2021  Medication Sig   amLODipine (NORVASC) 2.5 MG tablet TAKE 1 TABLET BY MOUTH  DAILY   aspirin 81 MG tablet Take 81 mg by mouth daily.   atorvastatin (LIPITOR) 20 MG tablet Take 1 tablet (20 mg total) by mouth at bedtime.   benzonatate (TESSALON) 100 MG capsule Take 1 capsule (100 mg total) by mouth 2 (two) times daily as needed for cough.   budesonide-formoterol (SYMBICORT) 160-4.5 MCG/ACT inhaler Inhale 2 puffs into the lungs 2 (two) times daily.   cholecalciferol (VITAMIN D3) 25 MCG (1000 UT) tablet Take 1,000 Units by mouth daily.   [EXPIRED] Continuous Blood Gluc Receiver (FREESTYLE LIBRE 2 READER) DEVI 1 Device by Does not apply route once for 1 dose.   Continuous Blood Gluc Sensor (FREESTYLE LIBRE 2 SENSOR) MISC 1 Device by Does not apply route every 14 (fourteen) days.   feeding supplement, GLUCERNA SHAKE, (GLUCERNA SHAKE) LIQD One a day    ferrous sulfate 324 MG TBEC Take by mouth.   furosemide (LASIX) 40 MG tablet TAKE ONE-HALF TABLET BY  MOUTH DAILY   gabapentin (NEURONTIN) 300 MG capsule TAKE 1 CAPSULE BY MOUTH  DAILY AND 1 CAPSULE BY  MOUTH AT NIGHT   glucose blood test strip 1 each by Other route in the morning, at noon, in the evening, and at bedtime. Use as instructed   HUMALOG KWIKPEN 100 UNIT/ML KwikPen Inject 6-10 Units into the skin See admin instructions. Inject 6u under the skin daily at breakfast-time, 8u at lunch-time and inject 10u under the skin daily at dinner-time   hydrocortisone 1 % ointment Apply small amount mix with lotion at night for leg rash   ipratropium-albuterol (DUONEB) 0.5-2.5 (3) MG/3ML SOLN USE 3 ML VIA NEBULIZER EVERY 6 HOURS AS NEEDED   loratadine (CLARITIN) 10 MG tablet Take 1 tablet (10 mg total) by mouth daily.   losartan (COZAAR) 25 MG tablet Take 25 mg by mouth daily.   Magnesium 250 MG TABS Take 250 mg by mouth 2 (two) times daily.   mometasone (NASONEX) 50 MCG/ACT nasal spray Place 2 sprays into the nose daily.   montelukast (SINGULAIR) 10 MG tablet Take 1 tablet (10 mg total) by mouth daily.   niacin (NIASPAN) 500 MG CR tablet niacin ER 500 mg tablet,extended release 24 hr   senna (SENOKOT) 8.6 MG tablet Take 1 tablet (8.6 mg total) by mouth daily.  simethicone (MYLICON) 80 MG chewable tablet Chew 2 tablets (160 mg total) by mouth 2 (two) times daily.   TOUJEO SOLOSTAR 300 UNIT/ML SOPN Inject 25 Units into the skin at bedtime.    vitamin B-12 (CYANOCOBALAMIN) 1000 MCG tablet Take 1,000 mcg by mouth daily.   [DISCONTINUED] chlorpheniramine-HYDROcodone (TUSSIONEX PENNKINETIC ER) 10-8 MG/5ML SUER Take 5 cc at night for cough   [DISCONTINUED] pneumococcal 20-valent conjugate vaccine (PREVNAR 20) 0.5 ML injection Inject 0.5 mLs into the muscle tomorrow at 10 am.   [DISCONTINUED] Zoster Vaccine Adjuvanted [facility]) injection Inject 0.5 mLs into the muscle once.   chlorpheniramine-HYDROcodone  (TUSSIONEX PENNKINETIC ER) 10-8 MG/5ML SUER Take 5 cc at night for cough   [EXPIRED] pneumococcal 20-valent conjugate vaccine (PREVNAR 20) 0.5 ML injection Inject 0.5 mLs into the muscle tomorrow at 10 am for 1 dose.   [EXPIRED] Zoster Vaccine Adjuvanted [facility]) injection Inject 0.5 mLs into the muscle once for 1 dose.   [DISCONTINUED] azithromycin (ZITHROMAX Z-PAK) 250 MG tablet Take 1 tablet daily for 10 days (Patient not taking: Reported on 09/19/2021)   No facility-administered encounter medications on file as of 09/19/2021.    Surgical History: Past Surgical History:  Procedure Laterality Date   APPENDECTOMY     COLONOSCOPY WITH PROPOFOL N/A 06/11/2015   Procedure: COLONOSCOPY WITH PROPOFOL;  Surgeon: Manya Silvas, MD;  Location[facility] ENDOSCOPY;  Service: Endoscopy;  Laterality: N/A;   CORONARY ARTERY BYPASS GRAFT     HERNIA REPAIR     TEE WITHOUT CARDIOVERSION     TRACHEOSTOMY     VASCULAR SURGERY      Medical History: Past Medical History:  Diagnosis Date   Anginal pain ([place])    Asthma    Coronary artery disease    Diabetes mellitus without complication ([place])    Hyperlipidemia    Hypertension    Sleep apnea     Family History: Family History  Problem Relation Age of Onset   Cancer Sister    Diabetes Daughter    Diabetes Son     Social History   Socioeconomic History   Marital status: Married    Spouse name: Not on file   Number of children: Not on file   Years of education: Not on file   Highest education level: Not on file  Occupational History   Not on file  Tobacco Use   Smoking status: Never   Smokeless tobacco: Never  Vaping Use   Vaping Use: Never used  Substance and Sexual Activity   Alcohol use: No   Drug use: No   Sexual activity: Not on file  Other Topics Concern   Not on file  Social History Narrative   Not on file   Social Determinants of Health   Financial Resource Strain: Not on file  Food Insecurity: Not on file   Transportation Needs: Not on file  Physical Activity: Not on file  Stress: Not on file  Social Connections: Not on file  Intimate Partner Violence: Not on file      Review of Systems  Constitutional:  Negative for chills, fatigue and unexpected weight change.  HENT:  Negative for congestion, rhinorrhea, sneezing and sore throat.   Eyes:  Negative for redness.  Respiratory:  Negative for cough, chest tightness and shortness of breath.   Cardiovascular:  Negative for chest pain and palpitations.  Gastrointestinal:  Negative for abdominal pain, constipation, diarrhea, nausea and vomiting.  Genitourinary:  Negative for dysuria and frequency.  Musculoskeletal:  Negative for  arthralgias, back pain, joint swelling and neck pain.  Skin:  Negative for rash.  Neurological: Negative.  Negative for tremors and numbness.  Hematological:  Negative for adenopathy. Does not bruise/bleed easily.  Psychiatric/Behavioral:  Negative for behavioral problems (Depression), sleep disturbance and suicidal ideas. The patient is not nervous/anxious.    Vital Signs: BP 110/70    Pulse 75    Temp 98.2 F (36.8 C)    Resp 16    Ht 5\' 6"  (1.676 m)    Wt 163 lb 6.4 oz (74.1 kg)    SpO2 99%    BMI 26.37 kg/m    Physical Exam Vitals reviewed.  Constitutional:      General: He is not in acute distress.    Appearance: Normal appearance. He is normal weight. He is not ill-appearing.  HENT:     Head: Normocephalic and atraumatic.  Eyes:     Pupils: Pupils are equal, round, and reactive to light.  Cardiovascular:     Rate and Rhythm: Normal rate and regular rhythm.  Pulmonary:     Effort: Pulmonary effort is normal. No respiratory distress.  Neurological:     Mental Status: He is alert and oriented to person, place, and time.     Cranial Nerves: No cranial nerve deficit.     Coordination: Coordination normal.     Gait: Gait normal.  Psychiatric:        Mood and Affect: Mood normal.        Behavior:  Behavior normal.       Assessment/Plan: 1. Type 2 diabetes mellitus with hyperglycemia, with long-term current use of insulin (HCC) A1C is elevated, patient refused any medication adjustments, will continue to work on diet and lifestyle modifications. Also wants to try freestyle libre for glucose monitoring. Follow up in 3 months to repeat a1c. - POCT HgB A1C - Continuous Blood Gluc Receiver (FREESTYLE LIBRE 2 READER) DEVI; 1 Device by Does not apply route once for 1 dose.  Dispense: 1 each; Refill: 0 - Continuous Blood Gluc Sensor (FREESTYLE LIBRE 2 SENSOR) MISC; 1 Device by Does not apply route every 14 (fourteen) days.  Dispense: 1 each; Refill: 5  2. Mild persistent asthma with acute exacerbation Has a cough due to his asthma sometimes, and occasionally takes tussionex to help with cough.  - chlorpheniramine-HYDROcodone (TUSSIONEX PENNKINETIC ER) 10-8 MG/5ML SUER; Take 5 cc at night for cough  Dispense: 140 mL; Refill: 0  3. OSA on CPAP New CPAP order placed to get a new machine for the patient. - For home use only DME continuous positive airway pressure (CPAP)  4. Need for vaccination - Zoster Vaccine Adjuvanted [facility]) injection; Inject 0.5 mLs into the muscle once for 1 dose.  Dispense: 0.5 mL; Refill: 0 - pneumococcal 20-valent conjugate vaccine (PREVNAR 20) 0.5 ML injection; Inject 0.5 mLs into the muscle tomorrow at 10 am for 1 dose.  Dispense: 0.5 mL; Refill: 0   General Counseling: Eder verbalizes understanding of the findings of todays visit and agrees with plan of treatment. I have discussed any further diagnostic evaluation that may be needed or ordered today. We also reviewed his medications today. he has been encouraged to call the office with any questions or concerns that should arise related to todays visit.    Orders Placed This Encounter  Procedures   For home use only DME continuous positive airway pressure (CPAP)   POCT HgB A1C    Meds ordered this  encounter  Medications  Zoster Vaccine Adjuvanted [facility]) injection    Sig: Inject 0.5 mLs into the muscle once for 1 dose.    Dispense:  0.5 mL    Refill:  0   pneumococcal 20-valent conjugate vaccine (PREVNAR 20) 0.5 ML injection    Sig: Inject 0.5 mLs into the muscle tomorrow at 10 am for 1 dose.    Dispense:  0.5 mL    Refill:  0   Continuous Blood Gluc Receiver (FREESTYLE LIBRE 2 READER) DEVI    Sig: 1 Device by Does not apply route once for 1 dose.    Dispense:  1 each    Refill:  0    May need PA, patient said his insurance should cover it, E11.65, he is on basal and short acting insulin   Continuous Blood Gluc Sensor (FREESTYLE LIBRE 2 SENSOR) MISC    Sig: 1 Device by Does not apply route every 14 (fourteen) days.    Dispense:  1 each    Refill:  5    E11.65, takes 2 types of insulin   chlorpheniramine-HYDROcodone (TUSSIONEX PENNKINETIC ER) 10-8 MG/5ML SUER    Sig: Take 5 cc at night for cough    Dispense:  140 mL    Refill:  0    Return in about 3 months (around 12/18/2021) for F/U, Recheck A1C, Weldon Nouri PCP.   Total time spent:30 Minutes Time spent includes review of chart, medications, test results, and follow up plan with the patient.   [place] Controlled Substance Database was reviewed by me.  This patient was seen by Jonetta Osgood, FNP-C in collaboration with Dr. Clayborn Bigness as a part of collaborative care agreement.   Jamaury Gumz R. Valetta Fuller, MSN, FNP-C Internal medicine

## 2021-09-19 NOTE — Telephone Encounter (Signed)
Sent Daniel Hodges with [facility] community message for order for new Cpap machine

## 2021-10-02 ENCOUNTER — Encounter: Payer: Self-pay | Admitting: Nurse Practitioner

## 2021-10-02 NOTE — Addendum Note (Signed)
Addended by: Jonetta Osgood on: 10/02/2021 01:04 PM   Modules accepted: Orders

## 2021-10-04 DIAGNOSIS — E782 Mixed hyperlipidemia: Secondary | ICD-10-CM | POA: Diagnosis not present

## 2021-10-04 DIAGNOSIS — I10 Essential (primary) hypertension: Secondary | ICD-10-CM | POA: Diagnosis not present

## 2021-10-04 DIAGNOSIS — I2510 Atherosclerotic heart disease of native coronary artery without angina pectoris: Secondary | ICD-10-CM | POA: Diagnosis not present

## 2021-10-04 DIAGNOSIS — I25118 Atherosclerotic heart disease of native coronary artery with other forms of angina pectoris: Secondary | ICD-10-CM | POA: Diagnosis not present

## 2021-10-04 DIAGNOSIS — N181 Chronic kidney disease, stage 1: Secondary | ICD-10-CM | POA: Diagnosis not present

## 2021-10-04 DIAGNOSIS — E1122 Type 2 diabetes mellitus with diabetic chronic kidney disease: Secondary | ICD-10-CM | POA: Diagnosis not present

## 2021-10-04 DIAGNOSIS — G4733 Obstructive sleep apnea (adult) (pediatric): Secondary | ICD-10-CM | POA: Diagnosis not present

## 2021-10-25 ENCOUNTER — Telehealth: Payer: Self-pay

## 2021-10-25 NOTE — Telephone Encounter (Signed)
Per [facility] patient hasn't returned any calls,text. If patient is still interested have them call [facility] to schedule. [phone].tat

## 2021-10-29 ENCOUNTER — Other Ambulatory Visit: Payer: Self-pay | Admitting: Internal Medicine

## 2021-10-29 DIAGNOSIS — Z0001 Encounter for general adult medical examination with abnormal findings: Secondary | ICD-10-CM

## 2021-10-29 DIAGNOSIS — E1165 Type 2 diabetes mellitus with hyperglycemia: Secondary | ICD-10-CM

## 2021-10-29 DIAGNOSIS — E782 Mixed hyperlipidemia: Secondary | ICD-10-CM

## 2021-10-29 DIAGNOSIS — I10 Essential (primary) hypertension: Secondary | ICD-10-CM

## 2021-10-29 DIAGNOSIS — Z862 Personal history of diseases of the blood and blood-forming organs and certain disorders involving the immune mechanism: Secondary | ICD-10-CM

## 2021-10-29 DIAGNOSIS — Z9989 Dependence on other enabling machines and devices: Secondary | ICD-10-CM

## 2021-10-29 DIAGNOSIS — J301 Allergic rhinitis due to pollen: Secondary | ICD-10-CM

## 2021-10-29 DIAGNOSIS — R300 Dysuria: Secondary | ICD-10-CM

## 2021-10-29 DIAGNOSIS — H6121 Impacted cerumen, right ear: Secondary | ICD-10-CM

## 2021-10-29 DIAGNOSIS — J4520 Mild intermittent asthma, uncomplicated: Secondary | ICD-10-CM

## 2021-10-29 DIAGNOSIS — G4733 Obstructive sleep apnea (adult) (pediatric): Secondary | ICD-10-CM

## 2021-10-29 DIAGNOSIS — E559 Vitamin D deficiency, unspecified: Secondary | ICD-10-CM

## 2021-10-29 DIAGNOSIS — Z794 Long term (current) use of insulin: Secondary | ICD-10-CM

## 2021-11-18 DIAGNOSIS — I10 Essential (primary) hypertension: Secondary | ICD-10-CM | POA: Diagnosis not present

## 2021-11-18 DIAGNOSIS — I2510 Atherosclerotic heart disease of native coronary artery without angina pectoris: Secondary | ICD-10-CM | POA: Diagnosis not present

## 2021-11-18 DIAGNOSIS — E1165 Type 2 diabetes mellitus with hyperglycemia: Secondary | ICD-10-CM | POA: Diagnosis not present

## 2021-11-18 DIAGNOSIS — E7800 Pure hypercholesterolemia, unspecified: Secondary | ICD-10-CM | POA: Diagnosis not present

## 2021-11-25 DIAGNOSIS — J4530 Mild persistent asthma, uncomplicated: Secondary | ICD-10-CM | POA: Diagnosis not present

## 2021-11-25 DIAGNOSIS — E1165 Type 2 diabetes mellitus with hyperglycemia: Secondary | ICD-10-CM | POA: Diagnosis not present

## 2021-11-25 DIAGNOSIS — I2510 Atherosclerotic heart disease of native coronary artery without angina pectoris: Secondary | ICD-10-CM | POA: Diagnosis not present

## 2021-11-25 DIAGNOSIS — E7800 Pure hypercholesterolemia, unspecified: Secondary | ICD-10-CM | POA: Diagnosis not present

## 2021-11-25 DIAGNOSIS — E0821 Diabetes mellitus due to underlying condition with diabetic nephropathy: Secondary | ICD-10-CM | POA: Diagnosis not present

## 2021-11-25 DIAGNOSIS — I10 Essential (primary) hypertension: Secondary | ICD-10-CM | POA: Diagnosis not present

## 2021-11-25 DIAGNOSIS — E0842 Diabetes mellitus due to underlying condition with diabetic polyneuropathy: Secondary | ICD-10-CM | POA: Diagnosis not present

## 2021-11-25 DIAGNOSIS — E083499 Diabetes mellitus due to underlying condition with severe nonproliferative diabetic retinopathy without macular edema, unspecified eye: Secondary | ICD-10-CM | POA: Diagnosis not present

## 2021-11-27 DIAGNOSIS — G4733 Obstructive sleep apnea (adult) (pediatric): Secondary | ICD-10-CM | POA: Diagnosis not present

## 2021-12-09 ENCOUNTER — Other Ambulatory Visit: Payer: Self-pay | Admitting: Nurse Practitioner

## 2021-12-09 ENCOUNTER — Other Ambulatory Visit: Payer: Self-pay

## 2021-12-09 DIAGNOSIS — E1165 Type 2 diabetes mellitus with hyperglycemia: Secondary | ICD-10-CM

## 2021-12-09 MED ORDER — FREESTYLE LIBRE 2 SENSOR MISC: 1.0000 | 3 refills | Status: DC

## 2021-12-18 ENCOUNTER — Encounter: Payer: Self-pay | Admitting: Nurse Practitioner

## 2021-12-18 ENCOUNTER — Ambulatory Visit (INDEPENDENT_AMBULATORY_CARE_PROVIDER_SITE_OTHER): Payer: Medicare Other | Admitting: Nurse Practitioner

## 2021-12-18 VITALS — BP 132/66 | HR 74 | Temp 98.0°F | Resp 16 | Ht 66.0 in | Wt 165.8 lb

## 2021-12-18 DIAGNOSIS — R600 Localized edema: Secondary | ICD-10-CM | POA: Diagnosis not present

## 2021-12-18 DIAGNOSIS — I10 Essential (primary) hypertension: Secondary | ICD-10-CM | POA: Diagnosis not present

## 2021-12-18 DIAGNOSIS — E1142 Type 2 diabetes mellitus with diabetic polyneuropathy: Secondary | ICD-10-CM

## 2021-12-18 MED ORDER — LOSARTAN POTASSIUM 25 MG PO TABS: 25.0000 mg | ORAL_TABLET | Freq: Every day | ORAL | 1 refills | Status: DC

## 2021-12-18 MED ORDER — GABAPENTIN 300 MG PO CAPS: ORAL_CAPSULE | ORAL | 3 refills | Status: DC

## 2021-12-18 MED ORDER — AMLODIPINE BESYLATE 2.5 MG PO TABS: 2.5000 mg | ORAL_TABLET | Freq: Every day | ORAL | 3 refills | Status: DC

## 2021-12-18 MED ORDER — FUROSEMIDE 40 MG PO TABS: 20.0000 mg | ORAL_TABLET | Freq: Every day | ORAL | 3 refills | Status: DC

## 2021-12-18 NOTE — Progress Notes (Signed)
[facility] ?[address] ?[place], [place] [address] ? ?Internal MEDICINE  ?Office Visit Note ? ?Patient Name: Daniel Hodges ? [id number]  ?[id number] ? ?Date of Service: 12/18/2021 ? ?Chief Complaint  ?Patient presents with  ? Follow-up  ? Diabetes  ?  Pt see Endocrinology  ? Hyperlipidemia  ? Hypertension  ? Quality Metric Gaps  ?  Shingles and Pneumonia Vaccine, possible upcoming eye exam in July  ? ? ?HPI ?Daniel Hodges presents for a follow up visit for hypertension, hyperlipidemia, and medication refills. His wife accompanied him to the office visit today and is asking for possible home care services to help with tasks and ADLs around the house.  ?Give info about holistic home care services  ? ? ?Current Medication: ?Outpatient Encounter Medications as of 12/18/2021  ?Medication Sig  ? aspirin 81 MG tablet Take 81 mg by mouth daily.  ? atorvastatin (LIPITOR) 20 MG tablet Take 1 tablet (20 mg total) by mouth at bedtime.  ? benzonatate (TESSALON) 100 MG capsule Take 1 capsule (100 mg total) by mouth 2 (two) times daily as needed for cough.  ? budesonide-formoterol (SYMBICORT) 160-4.5 MCG/ACT inhaler Inhale 2 puffs into the lungs 2 (two) times daily.  ? chlorpheniramine-HYDROcodone (TUSSIONEX PENNKINETIC ER) 10-8 MG/5ML SUER Take 5 cc at night for cough  ? cholecalciferol (VITAMIN D3) 25 MCG (1000 UT) tablet Take 1,000 Units by mouth daily.  ? Continuous Blood Gluc Receiver (FREESTYLE LIBRE 2 READER) DEVI USE AS DIRECTED  ? Continuous Blood Gluc Sensor (FREESTYLE LIBRE 2 SENSOR) MISC 1 Device by Does not apply route every 14 (fourteen) days.  ? feeding supplement, GLUCERNA SHAKE, ([place]) LIQD One a day  ? ferrous sulfate 324 MG TBEC Take by mouth.  ? glucose blood test strip 1 each by Other route in the morning, at noon, in the evening, and at bedtime. Use as instructed  ? HUMALOG KWIKPEN 100 UNIT/ML KwikPen Inject 6-10 Units into the skin See admin instructions. Inject 6u under the skin daily  at breakfast-time, 8u at lunch-time and inject 10u under the skin daily at dinner-time  ? hydrocortisone 1 % ointment Apply small amount mix with lotion at night for leg rash  ? ipratropium-albuterol (DUONEB) 0.5-2.5 (3) MG/3ML SOLN USE 3 ML VIA NEBULIZER EVERY 6 HOURS AS NEEDED  ? loratadine (CLARITIN) 10 MG tablet Take 1 tablet (10 mg total) by mouth daily.  ? Magnesium 250 MG TABS Take 250 mg by mouth 2 (two) times daily.  ? mometasone (NASONEX) 50 MCG/ACT nasal spray Place 2 sprays into the nose daily.  ? montelukast (SINGULAIR) 10 MG tablet TAKE 1 TABLET BY MOUTH  DAILY  ? niacin (NIASPAN) 500 MG CR tablet niacin ER 500 mg tablet,extended release 24 hr  ? senna (SENOKOT) 8.6 MG tablet Take 1 tablet (8.6 mg total) by mouth daily.  ? simethicone (MYLICON) 80 MG chewable tablet Chew 2 tablets (160 mg total) by mouth 2 (two) times daily.  ? TOUJEO SOLOSTAR 300 UNIT/ML SOPN Inject 25 Units into the skin at bedtime.   ? vitamin B-12 (CYANOCOBALAMIN) 1000 MCG tablet Take 1,000 mcg by mouth daily.  ? [DISCONTINUED] amLODipine (NORVASC) 2.5 MG tablet TAKE 1 TABLET BY MOUTH  DAILY  ? [DISCONTINUED] furosemide (LASIX) 40 MG tablet TAKE ONE-HALF TABLET BY  MOUTH DAILY  ? [DISCONTINUED] gabapentin (NEURONTIN) 300 MG capsule TAKE 1 CAPSULE BY MOUTH  DAILY AND 1 CAPSULE BY  MOUTH AT NIGHT  ? [DISCONTINUED] losartan (COZAAR) 25 MG tablet Take  25 mg by mouth daily.  ? amLODipine (NORVASC) 2.5 MG tablet Take 1 tablet (2.5 mg total) by mouth daily.  ? furosemide (LASIX) 40 MG tablet Take 0.5 tablets (20 mg total) by mouth daily.  ? gabapentin (NEURONTIN) 300 MG capsule TAKE 1 CAPSULE BY MOUTH  DAILY AND 1 CAPSULE BY  MOUTH AT NIGHT  ? losartan (COZAAR) 25 MG tablet Take 1 tablet (25 mg total) by mouth daily.  ? ?No facility-administered encounter medications on file as of 12/18/2021.  ? ? ?Surgical History: ?Past Surgical History:  ?Procedure Laterality Date  ? APPENDECTOMY    ? COLONOSCOPY WITH PROPOFOL N/A 06/11/2015  ? Procedure:  COLONOSCOPY WITH PROPOFOL;  Surgeon: Manya Silvas, MD;  Location: [facility] ENDOSCOPY;  Service: Endoscopy;  Laterality: N/A;  ? CORONARY ARTERY BYPASS GRAFT    ? HERNIA REPAIR    ? TEE WITHOUT CARDIOVERSION    ? TRACHEOSTOMY    ? VASCULAR SURGERY    ? ? ?Medical History: ?Past Medical History:  ?Diagnosis Date  ? Anginal pain ([place])   ? Asthma   ? Coronary artery disease   ? Diabetes mellitus without complication ([place])   ? Hyperlipidemia   ? Hypertension   ? Sleep apnea   ? ? ?Family History: ?Family History  ?Problem Relation Age of Onset  ? Cancer Sister   ? Diabetes Daughter   ? Diabetes Son   ? ? ?Social History  ? ?Socioeconomic History  ? Marital status: Married  ?  Spouse name: Not on file  ? Number of children: Not on file  ? Years of education: Not on file  ? Highest education level: Not on file  ?Occupational History  ? Not on file  ?Tobacco Use  ? Smoking status: Never  ? Smokeless tobacco: Never  ?Vaping Use  ? Vaping Use: Never used  ?Substance and Sexual Activity  ? Alcohol use: No  ? Drug use: No  ? Sexual activity: Not on file  ?Other Topics Concern  ? Not on file  ?Social History Narrative  ? Not on file  ? ?Social Determinants of Health  ? ?Financial Resource Strain: Not on file  ?Food Insecurity: Not on file  ?Transportation Needs: Not on file  ?Physical Activity: Not on file  ?Stress: Not on file  ?Social Connections: Not on file  ?Intimate Partner Violence: Not on file  ? ? ? ? ?Review of Systems  ?Constitutional:  Negative for chills, fatigue and unexpected weight change.  ?HENT:  Negative for congestion, rhinorrhea, sneezing and sore throat.   ?Eyes:  Negative for redness.  ?Respiratory:  Negative for cough, chest tightness and shortness of breath.   ?Cardiovascular:  Negative for chest pain and palpitations.  ?Gastrointestinal:  Negative for abdominal pain, constipation, diarrhea, nausea and vomiting.  ?Genitourinary:  Negative for dysuria and frequency.  ?Musculoskeletal:  Negative for  arthralgias, back pain, joint swelling and neck pain.  ?Skin:  Negative for rash.  ?Neurological: Negative.  Negative for tremors and numbness.  ?Hematological:  Negative for adenopathy. Does not bruise/bleed easily.  ?Psychiatric/Behavioral:  Negative for behavioral problems (Depression), sleep disturbance and suicidal ideas. The patient is not nervous/anxious.   ? ?Vital Signs: ?BP 132/66   Pulse 74   Temp 98 ?F (36.7 ?C)   Resp 16   Ht '5\' 6"'$  (1.676 m)   Wt 165 lb 12.8 oz (75.2 kg)   SpO2 99%   BMI 26.76 kg/m?  ? ? ?Physical Exam ?Vitals reviewed.  ?Constitutional:   ?  General: He is not in acute distress. ?   Appearance: Normal appearance. He is not ill-appearing.  ?HENT:  ?   Head: Normocephalic and atraumatic.  ?Eyes:  ?   Pupils: Pupils are equal, round, and reactive to light.  ?Cardiovascular:  ?   Rate and Rhythm: Normal rate and regular rhythm.  ?Pulmonary:  ?   Effort: Pulmonary effort is normal. No respiratory distress.  ?Neurological:  ?   Mental Status: He is alert and oriented to person, place, and time.  ?Psychiatric:     ?   Mood and Affect: Mood normal.     ?   Behavior: Behavior normal.  ? ? ? ? ? ?Assessment/Plan: ?1. Essential hypertension ?Stable, refills ordered.  ?- losartan (COZAAR) 25 MG tablet; Take 1 tablet (25 mg total) by mouth daily.  Dispense: 90 tablet; Refill: 1 ?- amLODipine (NORVASC) 2.5 MG tablet; Take 1 tablet (2.5 mg total) by mouth daily.  Dispense: 90 tablet; Refill: 3 ? ?2. Diabetic polyneuropathy associated with type 2 diabetes mellitus ([place]) ?Stable, refills ordered. Diabetes is managed by Dr. Truddie Coco, endocrinologist.  ?- gabapentin (NEURONTIN) 300 MG capsule; TAKE 1 CAPSULE BY MOUTH  DAILY AND 1 CAPSULE BY  MOUTH AT NIGHT  Dispense: 180 capsule; Refill: 3 ? ?3. Lower extremity edema ?Refills ordered.  ?- furosemide (LASIX) 40 MG tablet; Take 0.5 tablets (20 mg total) by mouth daily.  Dispense: 45 tablet; Refill: 3 ? ? ?General Counseling: Rodrigues verbalizes  understanding of the findings of todays visit and agrees with plan of treatment. I have discussed any further diagnostic evaluation that may be needed or ordered today. We also reviewed his medications today. he h

## 2021-12-20 ENCOUNTER — Encounter: Payer: Self-pay | Admitting: Nurse Practitioner

## 2021-12-24 ENCOUNTER — Telehealth: Payer: Self-pay

## 2021-12-24 NOTE — Telephone Encounter (Signed)
Order for CPAP supplies signed by Alyssa and placed in [place] folder. ?

## 2021-12-28 DIAGNOSIS — G4733 Obstructive sleep apnea (adult) (pediatric): Secondary | ICD-10-CM | POA: Diagnosis not present

## 2022-01-27 DIAGNOSIS — G4733 Obstructive sleep apnea (adult) (pediatric): Secondary | ICD-10-CM | POA: Diagnosis not present

## 2022-01-30 ENCOUNTER — Other Ambulatory Visit: Payer: Self-pay | Admitting: Physician Assistant

## 2022-01-30 DIAGNOSIS — R600 Localized edema: Secondary | ICD-10-CM

## 2022-02-03 ENCOUNTER — Telehealth: Payer: Self-pay

## 2022-02-03 NOTE — Telephone Encounter (Signed)
Pt's wife called and advised that pt was placing the [facility] on his arm and the needle was bent on one and the other they didn't see a needle.  I advised pt's wife to get in touch with the pharmacy and they are the ones responsible for the product.  I advised that if they need a new prescription sent that the pharmacy can let us know

## 2022-02-21 DIAGNOSIS — H35372 Puckering of macula, left eye: Secondary | ICD-10-CM | POA: Diagnosis not present

## 2022-02-27 DIAGNOSIS — G4733 Obstructive sleep apnea (adult) (pediatric): Secondary | ICD-10-CM | POA: Diagnosis not present

## 2022-03-13 ENCOUNTER — Other Ambulatory Visit: Payer: Self-pay | Admitting: Nurse Practitioner

## 2022-03-13 DIAGNOSIS — Z862 Personal history of diseases of the blood and blood-forming organs and certain disorders involving the immune mechanism: Secondary | ICD-10-CM

## 2022-03-13 DIAGNOSIS — E1165 Type 2 diabetes mellitus with hyperglycemia: Secondary | ICD-10-CM

## 2022-03-13 DIAGNOSIS — G4733 Obstructive sleep apnea (adult) (pediatric): Secondary | ICD-10-CM

## 2022-03-13 DIAGNOSIS — E559 Vitamin D deficiency, unspecified: Secondary | ICD-10-CM

## 2022-03-13 DIAGNOSIS — J4520 Mild intermittent asthma, uncomplicated: Secondary | ICD-10-CM

## 2022-03-13 DIAGNOSIS — R300 Dysuria: Secondary | ICD-10-CM

## 2022-03-13 DIAGNOSIS — Z0001 Encounter for general adult medical examination with abnormal findings: Secondary | ICD-10-CM

## 2022-03-13 DIAGNOSIS — E782 Mixed hyperlipidemia: Secondary | ICD-10-CM

## 2022-03-13 DIAGNOSIS — J301 Allergic rhinitis due to pollen: Secondary | ICD-10-CM

## 2022-03-13 DIAGNOSIS — H6121 Impacted cerumen, right ear: Secondary | ICD-10-CM

## 2022-03-13 DIAGNOSIS — I10 Essential (primary) hypertension: Secondary | ICD-10-CM

## 2022-03-14 ENCOUNTER — Other Ambulatory Visit: Payer: Self-pay

## 2022-03-14 DIAGNOSIS — Z794 Long term (current) use of insulin: Secondary | ICD-10-CM

## 2022-03-14 MED ORDER — FREESTYLE LIBRE 2 SENSOR MISC: 1.0000 | 3 refills | Status: DC

## 2022-03-20 ENCOUNTER — Other Ambulatory Visit: Payer: Self-pay | Admitting: Internal Medicine

## 2022-03-20 DIAGNOSIS — E1142 Type 2 diabetes mellitus with diabetic polyneuropathy: Secondary | ICD-10-CM

## 2022-03-21 ENCOUNTER — Ambulatory Visit (INDEPENDENT_AMBULATORY_CARE_PROVIDER_SITE_OTHER): Payer: Medicare Other | Admitting: Nurse Practitioner

## 2022-03-21 ENCOUNTER — Encounter: Payer: Self-pay | Admitting: Nurse Practitioner

## 2022-03-21 VITALS — BP 140/70 | HR 73 | Temp 98.4°F | Resp 16 | Ht 66.0 in | Wt 167.0 lb

## 2022-03-21 DIAGNOSIS — Z794 Long term (current) use of insulin: Secondary | ICD-10-CM

## 2022-03-21 DIAGNOSIS — J4520 Mild intermittent asthma, uncomplicated: Secondary | ICD-10-CM | POA: Diagnosis not present

## 2022-03-21 DIAGNOSIS — I10 Essential (primary) hypertension: Secondary | ICD-10-CM | POA: Diagnosis not present

## 2022-03-21 DIAGNOSIS — E1165 Type 2 diabetes mellitus with hyperglycemia: Secondary | ICD-10-CM

## 2022-03-21 DIAGNOSIS — E538 Deficiency of other specified B group vitamins: Secondary | ICD-10-CM | POA: Diagnosis not present

## 2022-03-21 DIAGNOSIS — Z862 Personal history of diseases of the blood and blood-forming organs and certain disorders involving the immune mechanism: Secondary | ICD-10-CM | POA: Diagnosis not present

## 2022-03-21 DIAGNOSIS — E559 Vitamin D deficiency, unspecified: Secondary | ICD-10-CM | POA: Diagnosis not present

## 2022-03-21 DIAGNOSIS — J301 Allergic rhinitis due to pollen: Secondary | ICD-10-CM

## 2022-03-21 DIAGNOSIS — Z0001 Encounter for general adult medical examination with abnormal findings: Secondary | ICD-10-CM | POA: Diagnosis not present

## 2022-03-21 DIAGNOSIS — E782 Mixed hyperlipidemia: Secondary | ICD-10-CM | POA: Diagnosis not present

## 2022-03-21 DIAGNOSIS — R300 Dysuria: Secondary | ICD-10-CM | POA: Diagnosis not present

## 2022-03-21 DIAGNOSIS — R053 Chronic cough: Secondary | ICD-10-CM

## 2022-03-21 LAB — POCT GLYCOSYLATED HEMOGLOBIN (HGB A1C): Hemoglobin A1C: 7.5 % — AB (ref 4.0–5.6)

## 2022-03-21 MED ORDER — LOSARTAN POTASSIUM 25 MG PO TABS: 25.0000 mg | ORAL_TABLET | Freq: Every day | ORAL | 3 refills | Status: DC

## 2022-03-21 MED ORDER — HYDROCOD POLI-CHLORPHE POLI ER 10-8 MG/5ML PO SUER: 5.0000 mL | Freq: Two times a day (BID) | ORAL | 0 refills | Status: DC | PRN

## 2022-03-21 MED ORDER — LORATADINE 10 MG PO TABS: 10.0000 mg | ORAL_TABLET | Freq: Every day | ORAL | 3 refills | Status: DC

## 2022-03-21 MED ORDER — FUROSEMIDE 20 MG PO TABS: 20.0000 mg | ORAL_TABLET | Freq: Every day | ORAL | 3 refills | Status: DC

## 2022-03-21 MED ORDER — IPRATROPIUM-ALBUTEROL 0.5-2.5 (3) MG/3ML IN SOLN: RESPIRATORY_TRACT | 1 refills | Status: DC

## 2022-03-21 MED ORDER — BUDESONIDE-FORMOTEROL FUMARATE 160-4.5 MCG/ACT IN AERO: 2.0000 | INHALATION_SPRAY | Freq: Two times a day (BID) | RESPIRATORY_TRACT | 3 refills | Status: DC

## 2022-03-21 MED ORDER — ALBUTEROL SULFATE HFA 108 (90 BASE) MCG/ACT IN AERS: 2.0000 | INHALATION_SPRAY | Freq: Four times a day (QID) | RESPIRATORY_TRACT | 5 refills | Status: DC | PRN

## 2022-03-21 NOTE — Progress Notes (Signed)
[facility] [place], [place] [address]  Internal MEDICINE  Office Visit Note  Patient Name: Daniel Hodges  [id number]  [id number]  Date of Service: 03/21/2022  Chief Complaint  Patient presents with   Medicare Wellness    Discuss freestyle sensor     HPI Daniel Hodges presents for an annual well visit and physical exam.  He is a well-appearing 81 year old male and is accompanied by his wife to his office visit today.  He has diabetes, asthma, sleep apnea, hyperlipidemia, and hypertension. His surgical history is significant for appendectomy, hernia repair and CABG. He is retired and lives at home with his wife.  He is not due for any preventative screenings at this time.  He is due for routine labs.  His A1c was checked today and it was 7.5 which is improved from 7.9 in January this year.  He does see endocrinology for management of diabetes, Dr. Truddie Coco and he is scheduled to see his endocrinologist in September this year.  His blood pressure and other vital signs are stable and within normal limits.  He is due for diabetic foot exam.  He has no other questions or concerns.  He denies any new or worsening pain.    Current Medication: Outpatient Encounter Medications as of 03/21/2022  Medication Sig   albuterol (VENTOLIN HFA) 108 (90 Base) MCG/ACT inhaler Inhale 2 puffs into the lungs every 6 (six) hours as needed for wheezing or shortness of breath.   amLODipine (NORVASC) 2.5 MG tablet Take 1 tablet (2.5 mg total) by mouth daily.   aspirin 81 MG tablet Take 81 mg by mouth daily.   atorvastatin (LIPITOR) 20 MG tablet TAKE 1 TABLET BY MOUTH AT  BEDTIME   chlorpheniramine-HYDROcodone (TUSSIONEX PENNKINETIC ER) 10-8 MG/5ML Take 5 mLs by mouth every 12 (twelve) hours as needed for cough.   cholecalciferol (VITAMIN D3) 25 MCG (1000 UT) tablet Take 1,000 Units by mouth daily.   Continuous Blood Gluc Receiver (FREESTYLE LIBRE 2 READER) DEVI USE AS DIRECTED    Continuous Blood Gluc Sensor (FREESTYLE LIBRE 2 SENSOR) MISC 1 Device by Does not apply route every 14 (fourteen) days.   feeding supplement, GLUCERNA SHAKE, (GLUCERNA SHAKE) LIQD One a day   ferrous sulfate 324 MG TBEC Take by mouth.   furosemide (LASIX) 20 MG tablet Take 1 tablet (20 mg total) by mouth daily.   glucose blood test strip 1 each by Other route in the morning, at noon, in the evening, and at bedtime. Use as instructed   HUMALOG KWIKPEN 100 UNIT/ML KwikPen Inject 6-10 Units into the skin See admin instructions. Inject 6u under the skin daily at breakfast-time, 8u at lunch-time and inject 10u under the skin daily at dinner-time   hydrocortisone 1 % ointment Apply small amount mix with lotion at night for leg rash   Magnesium 250 MG TABS Take 250 mg by mouth 2 (two) times daily.   montelukast (SINGULAIR) 10 MG tablet TAKE 1 TABLET BY MOUTH  DAILY   niacin (NIASPAN) 500 MG CR tablet niacin ER 500 mg tablet,extended release 24 hr   senna (SENOKOT) 8.6 MG tablet Take 1 tablet (8.6 mg total) by mouth daily.   simethicone (MYLICON) 80 MG chewable tablet Chew 2 tablets (160 mg total) by mouth 2 (two) times daily.   TOUJEO SOLOSTAR 300 UNIT/ML SOPN Inject 25 Units into the skin at bedtime.    vitamin B-12 (CYANOCOBALAMIN) 1000 MCG tablet Take 1,000 mcg by mouth daily.   [  DISCONTINUED] budesonide-formoterol (SYMBICORT) 160-4.5 MCG/ACT inhaler Inhale 2 puffs into the lungs 2 (two) times daily.   [DISCONTINUED] chlorpheniramine-HYDROcodone (TUSSIONEX PENNKINETIC ER) 10-8 MG/5ML SUER Take 5 cc at night for cough   [DISCONTINUED] furosemide (LASIX) 40 MG tablet TAKE ONE-HALF TABLET BY  MOUTH DAILY   [DISCONTINUED] gabapentin (NEURONTIN) 300 MG capsule TAKE 1 CAPSULE BY MOUTH  DAILY AND 1 CAPSULE BY  MOUTH AT NIGHT   [DISCONTINUED] ipratropium-albuterol (DUONEB) 0.5-2.5 (3) MG/3ML SOLN USE 3 ML VIA NEBULIZER EVERY 6 HOURS AS NEEDED   [DISCONTINUED] loratadine (CLARITIN) 10 MG tablet Take 1 tablet  (10 mg total) by mouth daily.   [DISCONTINUED] losartan (COZAAR) 25 MG tablet Take 1 tablet (25 mg total) by mouth daily.   [DISCONTINUED] mometasone (NASONEX) 50 MCG/ACT nasal spray Place 2 sprays into the nose daily.   budesonide-formoterol (SYMBICORT) 160-4.5 MCG/ACT inhaler Inhale 2 puffs into the lungs 2 (two) times daily.   ipratropium-albuterol (DUONEB) 0.5-2.5 (3) MG/3ML SOLN USE 3 ML VIA NEBULIZER EVERY 6 HOURS AS NEEDED   loratadine (CLARITIN) 10 MG tablet Take 1 tablet (10 mg total) by mouth daily.   losartan (COZAAR) 25 MG tablet Take 1 tablet (25 mg total) by mouth daily.   [DISCONTINUED] benzonatate (TESSALON) 100 MG capsule Take 1 capsule (100 mg total) by mouth 2 (two) times daily as needed for cough. (Patient not taking: Reported on 03/21/2022)   No facility-administered encounter medications on file as of 03/21/2022.    Surgical History: Past Surgical History:  Procedure Laterality Date   APPENDECTOMY     COLONOSCOPY WITH PROPOFOL N/A 06/11/2015   Procedure: COLONOSCOPY WITH PROPOFOL;  Surgeon: Manya Silvas, MD;  Location: [facility] ENDOSCOPY;  Service: Endoscopy;  Laterality: N/A;   CORONARY ARTERY BYPASS GRAFT     HERNIA REPAIR     TEE WITHOUT CARDIOVERSION     TRACHEOSTOMY     VASCULAR SURGERY      Medical History: Past Medical History:  Diagnosis Date   Anginal pain ([place])    Asthma    Coronary artery disease    Diabetes mellitus without complication ([place])    Hyperlipidemia    Hypertension    Sleep apnea     Family History: Family History  Problem Relation Age of Onset   Cancer Sister    Diabetes Daughter    Diabetes Son     Social History   Socioeconomic History   Marital status: Married    Spouse name: Not on file   Number of children: Not on file   Years of education: Not on file   Highest education level: Not on file  Occupational History   Not on file  Tobacco Use   Smoking status: Never   Smokeless tobacco: Never  Vaping Use   Vaping  Use: Never used  Substance and Sexual Activity   Alcohol use: No   Drug use: No   Sexual activity: Not on file  Other Topics Concern   Not on file  Social History Narrative   Not on file   Social Determinants of Health   Financial Resource Strain: Not on file  Food Insecurity: Not on file  Transportation Needs: Not on file  Physical Activity: Not on file  Stress: Not on file  Social Connections: Not on file  Intimate Partner Violence: Not on file      Review of Systems  Constitutional:  Negative for activity change, appetite change, chills, fatigue, fever and unexpected weight change.  HENT: Negative.  Negative for  congestion, ear pain, rhinorrhea, sore throat and trouble swallowing.   Eyes: Negative.   Respiratory: Negative.  Negative for cough, chest tightness, shortness of breath and wheezing.   Cardiovascular: Negative.  Negative for chest pain.  Gastrointestinal: Negative.  Negative for abdominal pain, blood in stool, constipation, diarrhea, nausea and vomiting.  Endocrine: Negative.   Genitourinary: Negative.  Negative for difficulty urinating, dysuria, frequency, hematuria and urgency.  Musculoskeletal: Negative.  Negative for arthralgias, back pain, joint swelling, myalgias and neck pain.  Skin: Negative.  Negative for rash and wound.  Allergic/Immunologic: Negative.  Negative for immunocompromised state.  Neurological: Negative.  Negative for dizziness, seizures, numbness and headaches.  Hematological: Negative.   Psychiatric/Behavioral: Negative.  Negative for behavioral problems, self-injury and suicidal ideas. The patient is not nervous/anxious.     Vital Signs: BP 140/70   Pulse 73   Temp 98.4 F (36.9 C)   Resp 16   Ht $R'5\' 6"'Vl$  (1.676 m)   Wt 167 lb (75.8 kg)   SpO2 98%   BMI 26.95 kg/m    Physical Exam Vitals reviewed.  Constitutional:      General: He is awake. He is not in acute distress.    Appearance: Normal appearance. He is well-developed,  well-groomed and overweight. He is not ill-appearing or diaphoretic.  HENT:     Head: Normocephalic and atraumatic.     Right Ear: External ear normal. There is impacted cerumen.     Left Ear: Tympanic membrane, ear canal and external ear normal.     Nose: Nose normal. No congestion or rhinorrhea.     Mouth/Throat:     Lips: Pink.     Mouth: Mucous membranes are moist.     Pharynx: Oropharynx is clear. Uvula midline. No oropharyngeal exudate or posterior oropharyngeal erythema.  Eyes:     General: Lids are normal. Vision grossly intact. Gaze aligned appropriately. No scleral icterus.       Right eye: No discharge.        Left eye: No discharge.     Extraocular Movements: Extraocular movements intact.     Conjunctiva/sclera: Conjunctivae normal.     Pupils: Pupils are equal, round, and reactive to light.     Funduscopic exam:    Right eye: Red reflex present.        Left eye: Red reflex present. Neck:     Thyroid: No thyromegaly.     Vascular: No JVD.     Trachea: Trachea and phonation normal. No tracheal deviation.  Cardiovascular:     Rate and Rhythm: Normal rate and regular rhythm.     Pulses:          Dorsalis pedis pulses are 2+ on the right side and 2+ on the left side.       Posterior tibial pulses are 2+ on the right side and 2+ on the left side.     Heart sounds: Normal heart sounds, S1 normal and S2 normal. No murmur heard.    No friction rub. No gallop.  Pulmonary:     Effort: Pulmonary effort is normal. No accessory muscle usage or respiratory distress.     Breath sounds: Normal breath sounds and air entry. No stridor. No wheezing or rales.  Chest:     Chest wall: No tenderness.  Abdominal:     General: Bowel sounds are normal. There is no distension.     Palpations: Abdomen is soft. There is no mass.     Tenderness: There is no  abdominal tenderness. There is no guarding or rebound.  Musculoskeletal:        General: No tenderness or deformity. Normal range of  motion.     Cervical back: Normal range of motion and neck supple.     Right lower leg: No edema.     Left lower leg: No edema.     Right foot: Normal range of motion. No deformity, bunion, Charcot foot, foot drop or prominent metatarsal heads.     Left foot: Normal range of motion. No deformity, bunion, Charcot foot, foot drop or prominent metatarsal heads.  Feet:     Right foot:     Protective Sensation: 6 sites tested.  6 sites sensed.     Skin integrity: Dry skin present. No ulcer, blister, skin breakdown, erythema, warmth, callus or fissure.     Toenail Condition: Right toenails are abnormally thick and long.     Left foot:     Protective Sensation: 6 sites tested.  6 sites sensed.     Skin integrity: Dry skin present. No ulcer, blister, skin breakdown, erythema, warmth, callus or fissure.     Toenail Condition: Left toenails are abnormally thick and long.  Lymphadenopathy:     Cervical: No cervical adenopathy.  Skin:    General: Skin is warm and dry.     Coloration: Skin is not pale.     Findings: No erythema or rash.  Neurological:     Mental Status: He is alert and oriented to person, place, and time.     Cranial Nerves: No cranial nerve deficit.     Motor: No abnormal muscle tone.     Coordination: Coordination normal.     Gait: Gait normal.     Deep Tendon Reflexes: Reflexes are normal and symmetric.  Psychiatric:        Mood and Affect: Mood and affect normal.        Behavior: Behavior normal. Behavior is cooperative.        Thought Content: Thought content normal.        Judgment: Judgment normal.       Assessment/Plan: 1. Encounter for general adult medical examination with abnormal findings Age-appropriate preventive screenings and vaccinations discussed, annual physical exam completed. Routine labs for health maintenance ordered . PHM updated.  - Lipid Profile - Vitamin D (25 hydroxy) - CMP14+EGFR - CBC with Differential/Platelet - B12 and Folate  Panel  2. Essential hypertension Routine labs ordered, blood pressure is stable with current medications, refills ordered. - CMP14+EGFR - losartan (COZAAR) 25 MG tablet; Take 1 tablet (25 mg total) by mouth daily.  Dispense: 90 tablet; Refill: 3 - furosemide (LASIX) 20 MG tablet; Take 1 tablet (20 mg total) by mouth daily.  Dispense: 90 tablet; Refill: 3  3. Type 2 diabetes mellitus with hyperglycemia, with long-term current use of insulin [facility]) Patient sees Dr. Francoise Schaumann for diabetes management.  A1c done today in office and was 7.5 which is improved from 7.9.  His A1c was done because he reports that Dr. Francoise Schaumann has not been doing his A1c.  Plan to send routine lab results to Dr. Theora Gianotti office so that he has a copy as well - POCT HgB A1C - CMP14+EGFR  4. Chronic cough Testing next refill ordered per patient request - chlorpheniramine-HYDROcodone (TUSSIONEX PENNKINETIC ER) 10-8 MG/5ML; Take 5 mLs by mouth every 12 (twelve) hours as needed for cough.  Dispense: 200 mL; Refill: 0  5. 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  Dispense: 3 each; Refill: 3 - ipratropium-albuterol (DUONEB) 0.5-2.5 (3) MG/3ML SOLN; USE 3 ML VIA NEBULIZER EVERY 6 HOURS AS NEEDED  Dispense: 1080 mL; Refill: 1 - albuterol (VENTOLIN HFA) 108 (90 Base) MCG/ACT inhaler; Inhale 2 puffs into the lungs every 6 (six) hours as needed for wheezing or shortness of breath.  Dispense: 8 g; Refill: 5  6. 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  Dispense: 90 tablet; Refill: 3  7. Mixed hyperlipidemia Routine labs ordered - Lipid Profile  8. History of anemia Routine labs ordered - CBC with Differential/Platelet - B12 and Folate  Panel  9. Vitamin D deficiency Routine labs ordered - Vitamin D (25 hydroxy)  10. B12 deficiency Routine labs ordered - CBC with Differential/Platelet - B12 and Folate Panel  11. Dysuria Routine urinalysis done - UA/M w/rflx Culture, Routine     General Counseling: Raysean verbalizes understanding of the findings of todays visit and agrees with plan of treatment. I have discussed any further diagnostic evaluation that may be needed or ordered today. We also reviewed his medications today. he has been encouraged to call the office with any questions or concerns that should arise related to todays visit.    Orders Placed This Encounter  Procedures   Lipid Profile   Vitamin D (25 hydroxy)   CMP14+EGFR   CBC with Differential/Platelet   B12 and Folate Panel   UA/M w/rflx Culture, Routine   POCT HgB A1C    Meds ordered this encounter  Medications   chlorpheniramine-HYDROcodone (TUSSIONEX PENNKINETIC ER) 10-8 MG/5ML    Sig: Take 5 mLs by mouth every 12 (twelve) hours as needed for cough.    Dispense:  200 mL    Refill:  0   losartan (COZAAR) 25 MG tablet    Sig: Take 1 tablet (25 mg total) by mouth daily.    Dispense:  90 tablet    Refill:  3   loratadine (CLARITIN) 10 MG tablet    Sig: Take 1 tablet (10 mg total) by mouth daily.    Dispense:  90 tablet    Refill:  3   furosemide (LASIX) 20 MG tablet    Sig: Take 1 tablet (20 mg total) by mouth daily.    Dispense:  90 tablet    Refill:  3   budesonide-formoterol (SYMBICORT) 160-4.5 MCG/ACT inhaler    Sig: Inhale 2 puffs into the lungs 2 (two) times daily.    Dispense:  3 each    Refill:  3   ipratropium-albuterol (DUONEB) 0.5-2.5 (3) MG/3ML SOLN    Sig: USE 3 ML VIA NEBULIZER EVERY 6 HOURS AS NEEDED    Dispense:  1080 mL    Refill:  1    For future refills   albuterol (VENTOLIN HFA) 108 (90 Base) MCG/ACT inhaler    Sig: Inhale 2 puffs into the lungs every 6 (six) hours as needed for wheezing or shortness of  breath.    Dispense:  8 g    Refill:  5    Return in about 6 months (around 09/21/2022) for F/U, Mairlyn Tegtmeyer PCP routine .   Total time spent:30 Minutes Time spent includes review of chart, medications, test results, and follow up plan with the patient.   [place] Controlled Substance Database was  reviewed by me.  This patient was seen by Jonetta Osgood, FNP-C in collaboration with Dr. Clayborn Bigness as a part of collaborative care agreement.  Loyalty Arentz R. Valetta Fuller, MSN, FNP-C Internal medicine

## 2022-03-22 LAB — MICROSCOPIC EXAMINATION
Bacteria, UA: NONE SEEN
Casts: NONE SEEN /lpf
Epithelial Cells (non renal): NONE SEEN /hpf (ref 0–10)
RBC, Urine: NONE SEEN /hpf (ref 0–2)
WBC, UA: NONE SEEN /hpf (ref 0–5)

## 2022-03-22 LAB — UA/M W/RFLX CULTURE, ROUTINE
Bilirubin, UA: NEGATIVE
Glucose, UA: NEGATIVE
Ketones, UA: NEGATIVE
Leukocytes,UA: NEGATIVE
Nitrite, UA: NEGATIVE
Protein,UA: NEGATIVE
RBC, UA: NEGATIVE
Specific Gravity, UA: 1.007 (ref 1.005–1.030)
Urobilinogen, Ur: 0.2 mg/dL (ref 0.2–1.0)
pH, UA: 5.5 (ref 5.0–7.5)

## 2022-03-26 DIAGNOSIS — E782 Mixed hyperlipidemia: Secondary | ICD-10-CM | POA: Diagnosis not present

## 2022-03-26 DIAGNOSIS — Z0001 Encounter for general adult medical examination with abnormal findings: Secondary | ICD-10-CM | POA: Diagnosis not present

## 2022-03-26 DIAGNOSIS — Z794 Long term (current) use of insulin: Secondary | ICD-10-CM | POA: Diagnosis not present

## 2022-03-26 DIAGNOSIS — E559 Vitamin D deficiency, unspecified: Secondary | ICD-10-CM | POA: Diagnosis not present

## 2022-03-26 DIAGNOSIS — E1165 Type 2 diabetes mellitus with hyperglycemia: Secondary | ICD-10-CM | POA: Diagnosis not present

## 2022-03-27 LAB — CBC WITH DIFFERENTIAL/PLATELET
Basophils Absolute: 0.1 10*3/uL (ref 0.0–0.2)
Basos: 1 %
EOS (ABSOLUTE): 0.1 10*3/uL (ref 0.0–0.4)
Eos: 2 %
Hematocrit: 41.1 % (ref 37.5–51.0)
Hemoglobin: 13.1 g/dL (ref 13.0–17.7)
Immature Grans (Abs): 0 10*3/uL (ref 0.0–0.1)
Immature Granulocytes: 1 %
Lymphocytes Absolute: 1 10*3/uL (ref 0.7–3.1)
Lymphs: 17 %
MCH: 27.1 pg (ref 26.6–33.0)
MCHC: 31.9 g/dL (ref 31.5–35.7)
MCV: 85 fL (ref 79–97)
Monocytes Absolute: 0.9 10*3/uL (ref 0.1–0.9)
Monocytes: 14 %
Neutrophils Absolute: 4.1 10*3/uL (ref 1.4–7.0)
Neutrophils: 65 %
Platelets: 267 10*3/uL (ref 150–450)
RBC: 4.83 x10E6/uL (ref 4.14–5.80)
RDW: 12.6 % (ref 11.6–15.4)
WBC: 6.2 10*3/uL (ref 3.4–10.8)

## 2022-03-27 LAB — VITAMIN D 25 HYDROXY (VIT D DEFICIENCY, FRACTURES): Vit D, 25-Hydroxy: 30.9 ng/mL (ref 30.0–100.0)

## 2022-03-27 LAB — B12 AND FOLATE PANEL
Folate: 10.5 ng/mL (ref >3.0)
Vitamin B-12: 273 pg/mL (ref 232–1245)

## 2022-03-27 LAB — CMP14+EGFR
ALT: 19 IU/L (ref 0–44)
AST: 19 IU/L (ref 0–40)
Albumin/Globulin Ratio: 1.8 (ref 1.2–2.2)
Albumin: 4.6 g/dL (ref 3.7–4.7)
Alkaline Phosphatase: 137 IU/L — ABNORMAL HIGH (ref 44–121)
BUN/Creatinine Ratio: 14 (ref 10–24)
BUN: 16 mg/dL (ref 8–27)
Bilirubin Total: 0.6 mg/dL (ref 0.0–1.2)
CO2: 24 mmol/L (ref 20–29)
Calcium: 9 mg/dL (ref 8.6–10.2)
Chloride: 97 mmol/L (ref 96–106)
Creatinine, Ser: 1.12 mg/dL (ref 0.76–1.27)
Globulin, Total: 2.5 g/dL (ref 1.5–4.5)
Glucose: 86 mg/dL (ref 70–99)
Potassium: 5.3 mmol/L — ABNORMAL HIGH (ref 3.5–5.2)
Sodium: 135 mmol/L (ref 134–144)
Total Protein: 7.1 g/dL (ref 6.0–8.5)
eGFR: 66 mL/min/{1.73_m2} (ref >59)

## 2022-03-27 LAB — LIPID PANEL
Chol/HDL Ratio: 2 ratio (ref 0.0–5.0)
Cholesterol, Total: 100 mg/dL (ref 100–199)
HDL: 49 mg/dL (ref >39)
LDL Chol Calc (NIH): 40 mg/dL (ref 0–99)
Triglycerides: 42 mg/dL (ref 0–149)
VLDL Cholesterol Cal: 11 mg/dL (ref 5–40)

## 2022-03-29 DIAGNOSIS — G4733 Obstructive sleep apnea (adult) (pediatric): Secondary | ICD-10-CM | POA: Diagnosis not present

## 2022-04-01 ENCOUNTER — Other Ambulatory Visit: Payer: Self-pay

## 2022-04-01 ENCOUNTER — Telehealth: Payer: Self-pay

## 2022-04-01 DIAGNOSIS — E875 Hyperkalemia: Secondary | ICD-10-CM

## 2022-04-01 NOTE — Telephone Encounter (Signed)
-----   Message from Lavera Guise, MD sent at 03/31/2022  8:19 PM EDT ----- Pt has elevated potassium, please check if he is on any K supplement

## 2022-04-01 NOTE — Telephone Encounter (Signed)
Pt wife advised that stopped banana for now and he is not taking any supplement and recheck potassium in 4 week and aldo advised him b12 is low need B12 injection once a week for 3 weeks and once a month for 6 months

## 2022-04-02 ENCOUNTER — Ambulatory Visit (INDEPENDENT_AMBULATORY_CARE_PROVIDER_SITE_OTHER): Payer: Medicare Other

## 2022-04-02 DIAGNOSIS — E538 Deficiency of other specified B group vitamins: Secondary | ICD-10-CM | POA: Diagnosis not present

## 2022-04-02 MED ORDER — CYANOCOBALAMIN 1000 MCG/ML IJ SOLN
1000.0000 ug | Freq: Once | INTRAMUSCULAR | Status: AC
  Administered 2022-04-02: 1000 ug via INTRAMUSCULAR

## 2022-04-02 NOTE — Progress Notes (Signed)
B12 given to pt left deltoid

## 2022-04-07 DIAGNOSIS — B351 Tinea unguium: Secondary | ICD-10-CM | POA: Diagnosis not present

## 2022-04-07 DIAGNOSIS — I872 Venous insufficiency (chronic) (peripheral): Secondary | ICD-10-CM | POA: Diagnosis not present

## 2022-04-07 DIAGNOSIS — M79675 Pain in left toe(s): Secondary | ICD-10-CM | POA: Diagnosis not present

## 2022-04-07 DIAGNOSIS — E119 Type 2 diabetes mellitus without complications: Secondary | ICD-10-CM | POA: Diagnosis not present

## 2022-04-07 DIAGNOSIS — M79674 Pain in right toe(s): Secondary | ICD-10-CM | POA: Diagnosis not present

## 2022-04-09 ENCOUNTER — Ambulatory Visit (INDEPENDENT_AMBULATORY_CARE_PROVIDER_SITE_OTHER): Payer: Medicare Other

## 2022-04-09 DIAGNOSIS — E538 Deficiency of other specified B group vitamins: Secondary | ICD-10-CM

## 2022-04-09 MED ORDER — CYANOCOBALAMIN 1000 MCG/ML IJ SOLN
1000.0000 ug | Freq: Once | INTRAMUSCULAR | Status: AC
  Administered 2022-04-09: 1000 ug via INTRAMUSCULAR

## 2022-04-16 ENCOUNTER — Ambulatory Visit (INDEPENDENT_AMBULATORY_CARE_PROVIDER_SITE_OTHER): Payer: Medicare Other

## 2022-04-16 DIAGNOSIS — E538 Deficiency of other specified B group vitamins: Secondary | ICD-10-CM | POA: Diagnosis not present

## 2022-04-16 MED ORDER — CYANOCOBALAMIN 1000 MCG/ML IJ SOLN
1000.0000 ug | Freq: Once | INTRAMUSCULAR | Status: AC
  Administered 2022-04-16: 1000 ug via INTRAMUSCULAR

## 2022-04-29 DIAGNOSIS — G4733 Obstructive sleep apnea (adult) (pediatric): Secondary | ICD-10-CM | POA: Diagnosis not present

## 2022-05-14 ENCOUNTER — Ambulatory Visit (INDEPENDENT_AMBULATORY_CARE_PROVIDER_SITE_OTHER): Payer: Medicare Other

## 2022-05-14 DIAGNOSIS — E538 Deficiency of other specified B group vitamins: Secondary | ICD-10-CM

## 2022-05-14 MED ORDER — CYANOCOBALAMIN 1000 MCG/ML IJ SOLN
1000.0000 ug | Freq: Once | INTRAMUSCULAR | Status: AC
  Administered 2022-05-14: 1000 ug via INTRAMUSCULAR

## 2022-05-21 DIAGNOSIS — I10 Essential (primary) hypertension: Secondary | ICD-10-CM | POA: Diagnosis not present

## 2022-05-21 DIAGNOSIS — E1165 Type 2 diabetes mellitus with hyperglycemia: Secondary | ICD-10-CM | POA: Diagnosis not present

## 2022-05-21 DIAGNOSIS — E7800 Pure hypercholesterolemia, unspecified: Secondary | ICD-10-CM | POA: Diagnosis not present

## 2022-05-26 DIAGNOSIS — G4733 Obstructive sleep apnea (adult) (pediatric): Secondary | ICD-10-CM | POA: Diagnosis not present

## 2022-05-28 DIAGNOSIS — G4730 Sleep apnea, unspecified: Secondary | ICD-10-CM | POA: Diagnosis not present

## 2022-05-28 DIAGNOSIS — E7800 Pure hypercholesterolemia, unspecified: Secondary | ICD-10-CM | POA: Diagnosis not present

## 2022-05-28 DIAGNOSIS — I2510 Atherosclerotic heart disease of native coronary artery without angina pectoris: Secondary | ICD-10-CM | POA: Diagnosis not present

## 2022-05-28 DIAGNOSIS — E1165 Type 2 diabetes mellitus with hyperglycemia: Secondary | ICD-10-CM | POA: Diagnosis not present

## 2022-05-28 DIAGNOSIS — E0842 Diabetes mellitus due to underlying condition with diabetic polyneuropathy: Secondary | ICD-10-CM | POA: Diagnosis not present

## 2022-05-28 DIAGNOSIS — J4530 Mild persistent asthma, uncomplicated: Secondary | ICD-10-CM | POA: Diagnosis not present

## 2022-05-28 DIAGNOSIS — Z23 Encounter for immunization: Secondary | ICD-10-CM | POA: Diagnosis not present

## 2022-05-28 DIAGNOSIS — I10 Essential (primary) hypertension: Secondary | ICD-10-CM | POA: Diagnosis not present

## 2022-05-30 DIAGNOSIS — G4733 Obstructive sleep apnea (adult) (pediatric): Secondary | ICD-10-CM | POA: Diagnosis not present

## 2022-06-06 ENCOUNTER — Other Ambulatory Visit: Payer: Self-pay

## 2022-06-06 DIAGNOSIS — Z794 Long term (current) use of insulin: Secondary | ICD-10-CM

## 2022-06-06 MED ORDER — FREESTYLE LIBRE 2 SENSOR MISC: 1.0000 | 3 refills | Status: DC

## 2022-06-11 ENCOUNTER — Ambulatory Visit (INDEPENDENT_AMBULATORY_CARE_PROVIDER_SITE_OTHER): Payer: Medicare Other

## 2022-06-11 DIAGNOSIS — E538 Deficiency of other specified B group vitamins: Secondary | ICD-10-CM

## 2022-06-11 MED ORDER — CYANOCOBALAMIN 1000 MCG/ML IJ SOLN
1000.0000 ug | Freq: Once | INTRAMUSCULAR | Status: AC
  Administered 2022-06-11: 1000 ug via INTRAMUSCULAR

## 2022-06-18 ENCOUNTER — Ambulatory Visit: Payer: Medicare Other | Admitting: Nurse Practitioner

## 2022-06-29 DIAGNOSIS — G4733 Obstructive sleep apnea (adult) (pediatric): Secondary | ICD-10-CM | POA: Diagnosis not present

## 2022-07-03 DIAGNOSIS — E119 Type 2 diabetes mellitus without complications: Secondary | ICD-10-CM | POA: Diagnosis not present

## 2022-07-03 DIAGNOSIS — R55 Syncope and collapse: Secondary | ICD-10-CM | POA: Diagnosis not present

## 2022-07-03 DIAGNOSIS — I10 Essential (primary) hypertension: Secondary | ICD-10-CM | POA: Diagnosis not present

## 2022-07-03 DIAGNOSIS — I444 Left anterior fascicular block: Secondary | ICD-10-CM | POA: Diagnosis not present

## 2022-07-03 DIAGNOSIS — I52 Other heart disorders in diseases classified elsewhere: Secondary | ICD-10-CM | POA: Diagnosis not present

## 2022-07-03 DIAGNOSIS — I4510 Unspecified right bundle-branch block: Secondary | ICD-10-CM | POA: Diagnosis not present

## 2022-07-03 DIAGNOSIS — Z951 Presence of aortocoronary bypass graft: Secondary | ICD-10-CM | POA: Diagnosis not present

## 2022-07-03 DIAGNOSIS — E785 Hyperlipidemia, unspecified: Secondary | ICD-10-CM | POA: Diagnosis not present

## 2022-07-03 DIAGNOSIS — Z794 Long term (current) use of insulin: Secondary | ICD-10-CM | POA: Diagnosis not present

## 2022-07-03 DIAGNOSIS — I2510 Atherosclerotic heart disease of native coronary artery without angina pectoris: Secondary | ICD-10-CM | POA: Diagnosis not present

## 2022-07-08 DIAGNOSIS — E782 Mixed hyperlipidemia: Secondary | ICD-10-CM | POA: Diagnosis not present

## 2022-07-08 DIAGNOSIS — I2510 Atherosclerotic heart disease of native coronary artery without angina pectoris: Secondary | ICD-10-CM | POA: Diagnosis not present

## 2022-07-08 DIAGNOSIS — I10 Essential (primary) hypertension: Secondary | ICD-10-CM | POA: Diagnosis not present

## 2022-07-08 DIAGNOSIS — G4733 Obstructive sleep apnea (adult) (pediatric): Secondary | ICD-10-CM | POA: Diagnosis not present

## 2022-07-09 ENCOUNTER — Ambulatory Visit (INDEPENDENT_AMBULATORY_CARE_PROVIDER_SITE_OTHER): Payer: Medicare Other

## 2022-07-09 DIAGNOSIS — E538 Deficiency of other specified B group vitamins: Secondary | ICD-10-CM

## 2022-07-09 MED ORDER — CYANOCOBALAMIN 1000 MCG/ML IJ SOLN
1000.0000 ug | Freq: Once | INTRAMUSCULAR | Status: AC
  Administered 2022-07-09: 1000 ug via INTRAMUSCULAR

## 2022-07-16 ENCOUNTER — Ambulatory Visit (INDEPENDENT_AMBULATORY_CARE_PROVIDER_SITE_OTHER): Payer: Medicare Other | Admitting: Nurse Practitioner

## 2022-07-16 ENCOUNTER — Encounter: Payer: Self-pay | Admitting: Nurse Practitioner

## 2022-07-16 VITALS — BP 143/66 | HR 87 | Temp 98.3°F | Resp 16 | Ht 66.0 in | Wt 166.2 lb

## 2022-07-16 DIAGNOSIS — R051 Acute cough: Secondary | ICD-10-CM | POA: Diagnosis not present

## 2022-07-16 DIAGNOSIS — J018 Other acute sinusitis: Secondary | ICD-10-CM

## 2022-07-16 MED ORDER — BENZONATATE 200 MG PO CAPS: 200.0000 mg | ORAL_CAPSULE | Freq: Two times a day (BID) | ORAL | 0 refills | Status: DC | PRN

## 2022-07-16 MED ORDER — AZITHROMYCIN 250 MG PO TABS: ORAL_TABLET | ORAL | 0 refills | Status: AC

## 2022-07-16 MED ORDER — DOXYCYCLINE HYCLATE 100 MG PO TABS: 100.0000 mg | ORAL_TABLET | Freq: Two times a day (BID) | ORAL | 0 refills | Status: DC

## 2022-07-16 NOTE — Progress Notes (Signed)
[facility] [place], [place] [address]  Internal MEDICINE  Office Visit Note  Patient Name: Daniel Hodges  [id number]  [id number]  Date of Service: 07/16/2022  Chief Complaint  Patient presents with   Acute Visit   Cough    Cough for 1 week- Covid test Neg.     HPI Daniel Hodges presents for an acute sick visit for cough and other signs of upper respiratory infection.  --negative home covid test. Symptoms started about 1 week ago --cough, nasal congestion, runny nose, sneezing, fatigue   Current Medication:  Outpatient Encounter Medications as of 07/16/2022  Medication Sig   albuterol (VENTOLIN HFA) 108 (90 Base) MCG/ACT inhaler Inhale 2 puffs into the lungs every 6 (six) hours as needed for wheezing or shortness of breath.   amLODipine (NORVASC) 2.5 MG tablet Take 1 tablet (2.5 mg total) by mouth daily.   aspirin 81 MG tablet Take 81 mg by mouth daily.   atorvastatin (LIPITOR) 20 MG tablet TAKE 1 TABLET BY MOUTH AT  BEDTIME   benzonatate (TESSALON) 200 MG capsule Take 1 capsule (200 mg total) by mouth 2 (two) times daily as needed for cough.   budesonide-formoterol (SYMBICORT) 160-4.5 MCG/ACT inhaler Inhale 2 puffs into the lungs 2 (two) times daily.   chlorpheniramine-HYDROcodone (TUSSIONEX PENNKINETIC ER) 10-8 MG/5ML Take 5 mLs by mouth every 12 (twelve) hours as needed for cough.   cholecalciferol (VITAMIN D3) 25 MCG (1000 UT) tablet Take 1,000 Units by mouth daily.   Continuous Blood Gluc Receiver (FREESTYLE LIBRE 2 READER) DEVI USE AS DIRECTED   Continuous Blood Gluc Sensor (FREESTYLE LIBRE 2 SENSOR) MISC 1 Device by Does not apply route every 14 (fourteen) days.   doxycycline (VIBRA-TABS) 100 MG tablet Take 1 tablet (100 mg total) by mouth 2 (two) times daily for 10 days. Take with food   feeding supplement, GLUCERNA SHAKE, (GLUCERNA SHAKE) LIQD One a day   ferrous sulfate 324 MG TBEC Take by mouth.   furosemide (LASIX) 20 MG tablet Take 1  tablet (20 mg total) by mouth daily.   gabapentin (NEURONTIN) 300 MG capsule TAKE 1 CAPSULE BY MOUTH  DAILY AND 1 CAPSULE BY  MOUTH AT NIGHT   glucose blood test strip 1 each by Other route in the morning, at noon, in the evening, and at bedtime. Use as instructed   HUMALOG KWIKPEN 100 UNIT/ML KwikPen Inject 6-10 Units into the skin See admin instructions. Inject 6u under the skin daily at breakfast-time, 8u at lunch-time and inject 10u under the skin daily at dinner-time   hydrocortisone 1 % ointment Apply small amount mix with lotion at night for leg rash   ipratropium-albuterol (DUONEB) 0.5-2.5 (3) MG/3ML SOLN USE 3 ML VIA NEBULIZER EVERY 6 HOURS AS NEEDED   loratadine (CLARITIN) 10 MG tablet Take 1 tablet (10 mg total) by mouth daily.   losartan (COZAAR) 25 MG tablet Take 1 tablet (25 mg total) by mouth daily.   Magnesium 250 MG TABS Take 250 mg by mouth 2 (two) times daily.   montelukast (SINGULAIR) 10 MG tablet TAKE 1 TABLET BY MOUTH  DAILY   niacin (NIASPAN) 500 MG CR tablet niacin ER 500 mg tablet,extended release 24 hr   senna (SENOKOT) 8.6 MG tablet Take 1 tablet (8.6 mg total) by mouth daily.   simethicone (MYLICON) 80 MG chewable tablet Chew 2 tablets (160 mg total) by mouth 2 (two) times daily.   TOUJEO SOLOSTAR 300 UNIT/ML SOPN Inject 25 Units into the  skin at bedtime.    vitamin B-12 (CYANOCOBALAMIN) 1000 MCG tablet Take 1,000 mcg by mouth daily.   No facility-administered encounter medications on file as of 07/16/2022.      Medical History: Past Medical History:  Diagnosis Date   Anginal pain ([place])    Asthma    Coronary artery disease    Diabetes mellitus without complication ([facility])    Hyperlipidemia    Hypertension    Sleep apnea      Vital Signs: BP (!) 143/66   Pulse 87   Temp 98.3 F (36.8 C)   Resp 16   Ht '5\' 6"'$  (1.676 m)   Wt 166 lb 3.2 oz (75.4 kg)   SpO2 96%   BMI 26.83 kg/m    Review of Systems  Constitutional:  Positive for fatigue. Negative for  fever.  HENT:  Positive for congestion, postnasal drip and rhinorrhea. Negative for sinus pressure, sinus pain, sneezing and sore throat.   Respiratory:  Positive for cough, chest tightness, shortness of breath and wheezing.   Cardiovascular: Negative.  Negative for chest pain and palpitations.  Gastrointestinal: Negative.   Neurological:  Negative for headaches.    Physical Exam Constitutional:      Appearance: Normal appearance. He is ill-appearing.  HENT:     Head: Normocephalic and atraumatic.     Right Ear: There is impacted cerumen.     Left Ear: Tympanic membrane, ear canal and external ear normal.     Nose: Mucosal edema, congestion and rhinorrhea present. Rhinorrhea is clear.     Right Turbinates: Swollen and pale.     Left Turbinates: Swollen and pale.     Right Sinus: No maxillary sinus tenderness or frontal sinus tenderness.     Left Sinus: No maxillary sinus tenderness or frontal sinus tenderness.     Mouth/Throat:     Mouth: Mucous membranes are moist.     Pharynx: Posterior oropharyngeal erythema present.  Eyes:     Pupils: Pupils are equal, round, and reactive to light.  Cardiovascular:     Rate and Rhythm: Normal rate and regular rhythm.     Heart sounds: Normal heart sounds. No murmur heard. Pulmonary:     Effort: Pulmonary effort is normal. No accessory muscle usage or respiratory distress.     Breath sounds: Normal air entry. Examination of the right-upper field reveals wheezing. Examination of the left-upper field reveals wheezing. Examination of the right-middle field reveals wheezing. Examination of the left-middle field reveals wheezing. Wheezing present.  Neurological:     Mental Status: He is alert and oriented to person, place, and time.  Psychiatric:        Mood and Affect: Mood normal.        Behavior: Behavior normal.       Assessment/Plan: 1. Acute non-recurrent sinusitis of other sinus Empiric antibiotic treatment prescribed - doxycycline  (VIBRA-TABS) 100 MG tablet; Take 1 tablet (100 mg total) by mouth 2 (two) times daily for 10 days. Take with food  Dispense: 20 tablet; Refill: 0  2. Acute cough Medication prescribed for symptomatic relief of cough - benzonatate (TESSALON) 200 MG capsule; Take 1 capsule (200 mg total) by mouth 2 (two) times daily as needed for cough.  Dispense: 30 capsule; Refill: 0   General Counseling: Daymein verbalizes understanding of the findings of todays visit and agrees with plan of treatment. I have discussed any further diagnostic evaluation that may be needed or ordered today. We also reviewed his medications today. he  has been encouraged to call the office with any questions or concerns that should arise related to todays visit.    Counseling:    No orders of the defined types were placed in this encounter.   Meds ordered this encounter  Medications   doxycycline (VIBRA-TABS) 100 MG tablet    Sig: Take 1 tablet (100 mg total) by mouth 2 (two) times daily for 10 days. Take with food    Dispense:  20 tablet    Refill:  0    Fill today   benzonatate (TESSALON) 200 MG capsule    Sig: Take 1 capsule (200 mg total) by mouth 2 (two) times daily as needed for cough.    Dispense:  30 capsule    Refill:  0    Fill today please    Return if symptoms worsen or fail to improve, for and routine follow up in january..  [place] Controlled Substance Database was reviewed by me for overdose risk score (ORS)  Time spent:30 Minutes Time spent with patient included reviewing progress notes, labs, imaging studies, and discussing plan for follow up.   This patient was seen by Jonetta Osgood, FNP-C in collaboration with Dr. Clayborn Bigness as a part of collaborative care agreement.  Manolo Bosket R. Valetta Fuller, MSN, FNP-C Internal Medicine

## 2022-07-16 NOTE — Addendum Note (Signed)
Addended by: Jonetta Osgood on: 07/16/2022 03:49 PM   Modules accepted: Orders

## 2022-07-30 DIAGNOSIS — G4733 Obstructive sleep apnea (adult) (pediatric): Secondary | ICD-10-CM | POA: Diagnosis not present

## 2022-08-13 ENCOUNTER — Ambulatory Visit (INDEPENDENT_AMBULATORY_CARE_PROVIDER_SITE_OTHER): Payer: Medicare Other

## 2022-08-13 DIAGNOSIS — E538 Deficiency of other specified B group vitamins: Secondary | ICD-10-CM | POA: Diagnosis not present

## 2022-08-13 MED ORDER — CYANOCOBALAMIN 1000 MCG/ML IJ SOLN
1000.0000 ug | Freq: Once | INTRAMUSCULAR | Status: AC
  Administered 2022-08-13: 1000 ug via INTRAMUSCULAR

## 2022-08-14 ENCOUNTER — Encounter: Payer: Self-pay | Admitting: Nurse Practitioner

## 2022-08-14 ENCOUNTER — Telehealth (INDEPENDENT_AMBULATORY_CARE_PROVIDER_SITE_OTHER): Payer: Medicare Other | Admitting: Nurse Practitioner

## 2022-08-14 VITALS — Resp 16 | Ht 66.0 in | Wt 166.0 lb

## 2022-08-14 DIAGNOSIS — R053 Chronic cough: Secondary | ICD-10-CM

## 2022-08-14 DIAGNOSIS — J4540 Moderate persistent asthma, uncomplicated: Secondary | ICD-10-CM

## 2022-08-14 MED ORDER — HYDROCOD POLI-CHLORPHE POLI ER 10-8 MG/5ML PO SUER: 5.0000 mL | Freq: Two times a day (BID) | ORAL | 0 refills | Status: DC | PRN

## 2022-08-14 MED ORDER — AZITHROMYCIN 250 MG PO TABS: ORAL_TABLET | ORAL | 0 refills | Status: DC

## 2022-08-14 MED ORDER — STIOLTO RESPIMAT 2.5-2.5 MCG/ACT IN AERS: 1.0000 | INHALATION_SPRAY | Freq: Every day | RESPIRATORY_TRACT | 11 refills | Status: DC

## 2022-08-14 NOTE — Progress Notes (Signed)
[facility] [place], [place] [address]  Internal MEDICINE  Telephone Visit  Patient Name: Daniel Hodges  [id number]  [id number]  Date of Service: 08/14/2022  I connected with the patient at 1235 by telephone and verified the patients identity using two identifiers.   I discussed the limitations, risks, security and privacy concerns of performing an evaluation and management service by telephone and the availability of in person appointments. I also discussed with the patient that there may be a patient responsible charge related to the service.  The patient expressed understanding and agrees to proceed.    Chief Complaint  Patient presents with   Telephone Assessment   Telephone Screen   Cough    HPI Daniel Hodges presents for a telehealth virtual visit for cough  Cough is persistent and has not improved in months.  Asthma is worsening, was having some periods of SOB recently. Needs inhaler than he can afford  Current Medication: Outpatient Encounter Medications as of 08/14/2022  Medication Sig   albuterol (VENTOLIN HFA) 108 (90 Base) MCG/ACT inhaler Inhale 2 puffs into the lungs every 6 (six) hours as needed for wheezing or shortness of breath.   amLODipine (NORVASC) 2.5 MG tablet Take 1 tablet (2.5 mg total) by mouth daily.   aspirin 81 MG tablet Take 81 mg by mouth daily.   atorvastatin (LIPITOR) 20 MG tablet TAKE 1 TABLET BY MOUTH AT  BEDTIME   azithromycin (ZITHROMAX) 250 MG tablet Take one tab a day for 10 days for uri   benzonatate (TESSALON) 200 MG capsule Take 1 capsule (200 mg total) by mouth 2 (two) times daily as needed for cough.   cholecalciferol (VITAMIN D3) 25 MCG (1000 UT) tablet Take 1,000 Units by mouth daily.   Continuous Blood Gluc Receiver (FREESTYLE LIBRE 2 READER) DEVI USE AS DIRECTED   Continuous Blood Gluc Sensor (FREESTYLE LIBRE 2 SENSOR) MISC 1 Device by Does not apply route every 14 (fourteen) days.   feeding supplement,  GLUCERNA SHAKE, (GLUCERNA SHAKE) LIQD One a day   ferrous sulfate 324 MG TBEC Take by mouth.   furosemide (LASIX) 20 MG tablet Take 1 tablet (20 mg total) by mouth daily.   gabapentin (NEURONTIN) 300 MG capsule TAKE 1 CAPSULE BY MOUTH  DAILY AND 1 CAPSULE BY  MOUTH AT NIGHT   glucose blood test strip 1 each by Other route in the morning, at noon, in the evening, and at bedtime. Use as instructed   HUMALOG KWIKPEN 100 UNIT/ML KwikPen Inject 6-10 Units into the skin See admin instructions. Inject 6u under the skin daily at breakfast-time, 8u at lunch-time and inject 10u under the skin daily at dinner-time   hydrocortisone 1 % ointment Apply small amount mix with lotion at night for leg rash   ipratropium-albuterol (DUONEB) 0.5-2.5 (3) MG/3ML SOLN USE 3 ML VIA NEBULIZER EVERY 6 HOURS AS NEEDED   loratadine (CLARITIN) 10 MG tablet Take 1 tablet (10 mg total) by mouth daily.   losartan (COZAAR) 25 MG tablet Take 1 tablet (25 mg total) by mouth daily.   Magnesium 250 MG TABS Take 250 mg by mouth 2 (two) times daily.   montelukast (SINGULAIR) 10 MG tablet TAKE 1 TABLET BY MOUTH  DAILY   niacin (NIASPAN) 500 MG CR tablet niacin ER 500 mg tablet,extended release 24 hr   senna (SENOKOT) 8.6 MG tablet Take 1 tablet (8.6 mg total) by mouth daily.   simethicone (MYLICON) 80 MG chewable tablet Chew 2 tablets (160  mg total) by mouth 2 (two) times daily.   Tiotropium Bromide-Olodaterol (STIOLTO RESPIMAT) 2.5-2.5 MCG/ACT AERS Inhale 1 Inhalation into the lungs daily.   TOUJEO SOLOSTAR 300 UNIT/ML SOPN Inject 25 Units into the skin at bedtime.    vitamin B-12 (CYANOCOBALAMIN) 1000 MCG tablet Take 1,000 mcg by mouth daily.   [DISCONTINUED] budesonide-formoterol (SYMBICORT) 160-4.5 MCG/ACT inhaler Inhale 2 puffs into the lungs 2 (two) times daily.   [DISCONTINUED] chlorpheniramine-HYDROcodone (TUSSIONEX PENNKINETIC ER) 10-8 MG/5ML Take 5 mLs by mouth every 12 (twelve) hours as needed for cough.    chlorpheniramine-HYDROcodone (TUSSIONEX) 10-8 MG/5ML Take 5 mLs by mouth every 12 (twelve) hours as needed for cough.   No facility-administered encounter medications on file as of 08/14/2022.    Surgical History: Past Surgical History:  Procedure Laterality Date   APPENDECTOMY     COLONOSCOPY WITH PROPOFOL N/A 06/11/2015   Procedure: COLONOSCOPY WITH PROPOFOL;  Surgeon: Manya Silvas, MD;  Location: [facility] ENDOSCOPY;  Service: Endoscopy;  Laterality: N/A;   CORONARY ARTERY BYPASS GRAFT     HERNIA REPAIR     TEE WITHOUT CARDIOVERSION     TRACHEOSTOMY     VASCULAR SURGERY      Medical History: Past Medical History:  Diagnosis Date   Anginal pain ([place])    Asthma    Coronary artery disease    Diabetes mellitus without complication ([place])    Hyperlipidemia    Hypertension    Sleep apnea     Family History: Family History  Problem Relation Age of Onset   Cancer Sister    Diabetes Daughter    Diabetes Son     Social History   Socioeconomic History   Marital status: Married    Spouse name: Not on file   Number of children: Not on file   Years of education: Not on file   Highest education level: Not on file  Occupational History   Not on file  Tobacco Use   Smoking status: Never   Smokeless tobacco: Never  Vaping Use   Vaping Use: Never used  Substance and Sexual Activity   Alcohol use: No   Drug use: No   Sexual activity: Not on file  Other Topics Concern   Not on file  Social History Narrative   Not on file   Social Determinants of Health   Financial Resource Strain: Not on file  Food Insecurity: Not on file  Transportation Needs: Not on file  Physical Activity: Not on file  Stress: Not on file  Social Connections: Not on file  Intimate Partner Violence: Not on file      Review of Systems  Constitutional:  Positive for fatigue. Negative for fever.  HENT:  Positive for congestion, postnasal drip and rhinorrhea. Negative for sinus pressure, sinus  pain, sneezing and sore throat.   Respiratory:  Positive for cough, chest tightness, shortness of breath and wheezing.   Cardiovascular: Negative.  Negative for chest pain and palpitations.  Gastrointestinal: Negative.   Neurological:  Negative for headaches.    Vital Signs: Resp 16   Ht '5\' 6"'$  (1.676 m)   Wt 166 lb (75.3 kg)   BMI 26.79 kg/m    Observation/Objective: He is alert and oriented, spoke to family member with him over the phone. He did not sound like he is in any acute distress over telephone call.     Assessment/Plan: 1. Chronic cough Antibiotic and cough suppressant prescribed - azithromycin (ZITHROMAX) 250 MG tablet; Take one tab a day  for 10 days for uri  Dispense: 10 tablet; Refill: 0 - chlorpheniramine-HYDROcodone (TUSSIONEX) 10-8 MG/5ML; Take 5 mLs by mouth every 12 (twelve) hours as needed for cough.  Dispense: 200 mL; Refill: 0  2. Moderate persistent asthma without complication Inhaler prescribed that will hopefully be covered by insurance.  - azithromycin (ZITHROMAX) 250 MG tablet; Take one tab a day for 10 days for uri  Dispense: 10 tablet; Refill: 0 - Tiotropium Bromide-Olodaterol (STIOLTO RESPIMAT) 2.5-2.5 MCG/ACT AERS; Inhale 1 Inhalation into the lungs daily.  Dispense: 4 g; Refill: 11   General Counseling: Charlene verbalizes understanding of the findings of today's phone visit and agrees with plan of treatment. I have discussed any further diagnostic evaluation that may be needed or ordered today. We also reviewed his medications today. he has been encouraged to call the office with any questions or concerns that should arise related to todays visit.  Return if symptoms worsen or fail to improve.   No orders of the defined types were placed in this encounter.   Meds ordered this encounter  Medications   azithromycin (ZITHROMAX) 250 MG tablet    Sig: Take one tab a day for 10 days for uri    Dispense:  10 tablet    Refill:  0    chlorpheniramine-HYDROcodone (TUSSIONEX) 10-8 MG/5ML    Sig: Take 5 mLs by mouth every 12 (twelve) hours as needed for cough.    Dispense:  200 mL    Refill:  0   Tiotropium Bromide-Olodaterol (STIOLTO RESPIMAT) 2.5-2.5 MCG/ACT AERS    Sig: Inhale 1 Inhalation into the lungs daily.    Dispense:  4 g    Refill:  11    Time spent:10 Minutes Time spent with patient included reviewing progress notes, labs, imaging studies, and discussing plan for follow up.  [place] Controlled Substance Database was reviewed by me for overdose risk score (ORS) if appropriate.  This patient was seen by Jonetta Osgood, FNP-C in collaboration with Dr. Clayborn Bigness as a part of collaborative care agreement.  Salisa Broz R. Valetta Fuller, MSN, FNP-C Internal medicine

## 2022-08-23 ENCOUNTER — Other Ambulatory Visit: Payer: Self-pay | Admitting: Internal Medicine

## 2022-08-23 DIAGNOSIS — R300 Dysuria: Secondary | ICD-10-CM

## 2022-08-23 DIAGNOSIS — E559 Vitamin D deficiency, unspecified: Secondary | ICD-10-CM

## 2022-08-23 DIAGNOSIS — J4520 Mild intermittent asthma, uncomplicated: Secondary | ICD-10-CM

## 2022-08-23 DIAGNOSIS — H6121 Impacted cerumen, right ear: Secondary | ICD-10-CM

## 2022-08-23 DIAGNOSIS — G4733 Obstructive sleep apnea (adult) (pediatric): Secondary | ICD-10-CM

## 2022-08-23 DIAGNOSIS — Z0001 Encounter for general adult medical examination with abnormal findings: Secondary | ICD-10-CM

## 2022-08-23 DIAGNOSIS — E1165 Type 2 diabetes mellitus with hyperglycemia: Secondary | ICD-10-CM

## 2022-08-23 DIAGNOSIS — J301 Allergic rhinitis due to pollen: Secondary | ICD-10-CM

## 2022-08-23 DIAGNOSIS — E782 Mixed hyperlipidemia: Secondary | ICD-10-CM

## 2022-08-23 DIAGNOSIS — Z862 Personal history of diseases of the blood and blood-forming organs and certain disorders involving the immune mechanism: Secondary | ICD-10-CM

## 2022-08-23 DIAGNOSIS — I10 Essential (primary) hypertension: Secondary | ICD-10-CM

## 2022-08-29 DIAGNOSIS — G4733 Obstructive sleep apnea (adult) (pediatric): Secondary | ICD-10-CM | POA: Diagnosis not present

## 2022-09-10 ENCOUNTER — Ambulatory Visit (INDEPENDENT_AMBULATORY_CARE_PROVIDER_SITE_OTHER): Payer: Medicare Other

## 2022-09-10 DIAGNOSIS — E538 Deficiency of other specified B group vitamins: Secondary | ICD-10-CM | POA: Diagnosis not present

## 2022-09-10 MED ORDER — CYANOCOBALAMIN 1000 MCG/ML IJ SOLN
1000.0000 ug | Freq: Once | INTRAMUSCULAR | Status: AC
  Administered 2022-09-10: 1000 ug via INTRAMUSCULAR

## 2022-09-16 ENCOUNTER — Other Ambulatory Visit: Payer: Self-pay

## 2022-09-16 DIAGNOSIS — E1165 Type 2 diabetes mellitus with hyperglycemia: Secondary | ICD-10-CM

## 2022-09-16 MED ORDER — FREESTYLE LIBRE 2 SENSOR MISC: 1.0000 | 3 refills | Status: DC

## 2022-09-19 ENCOUNTER — Encounter: Payer: Self-pay | Admitting: Nurse Practitioner

## 2022-09-19 ENCOUNTER — Ambulatory Visit (INDEPENDENT_AMBULATORY_CARE_PROVIDER_SITE_OTHER): Payer: Medicare Other | Admitting: Nurse Practitioner

## 2022-09-19 VITALS — BP 120/68 | HR 76 | Temp 97.5°F | Resp 16 | Ht 66.0 in | Wt 168.2 lb

## 2022-09-19 DIAGNOSIS — R053 Chronic cough: Secondary | ICD-10-CM

## 2022-09-19 DIAGNOSIS — I83812 Varicose veins of left lower extremities with pain: Secondary | ICD-10-CM | POA: Diagnosis not present

## 2022-09-19 DIAGNOSIS — T148XXA Other injury of unspecified body region, initial encounter: Secondary | ICD-10-CM

## 2022-09-19 DIAGNOSIS — J4540 Moderate persistent asthma, uncomplicated: Secondary | ICD-10-CM

## 2022-09-19 DIAGNOSIS — E538 Deficiency of other specified B group vitamins: Secondary | ICD-10-CM | POA: Diagnosis not present

## 2022-09-19 MED ORDER — MUPIROCIN 2 % EX OINT: 1.0000 | TOPICAL_OINTMENT | Freq: Two times a day (BID) | CUTANEOUS | 2 refills | Status: AC

## 2022-09-19 NOTE — Progress Notes (Unsigned)
[facility] [place], [place] [address]  Internal MEDICINE  Office Visit Note  Patient Name: Daniel Hodges  [id number]  [id number]  Date of Service: 09/19/2022  Chief Complaint  Patient presents with   Follow-up   Hypertension   Diabetes   Hyperlipidemia    HPI Daniel Hodges presents for a follow-up visit for B12 deficiency, arthritis, chronic cough and hypertension. B12 level -- time to recheck level to see if injections have been helping Arthritis --esp in hands and knees  Persistent chronic cough -- wears CPAP at night and last spiro was January 2021. CPAP titration was done in 2020, has never had pressure adjusted since then. Hypertension -- recheck BP, improved, in normal range    Current Medication: Outpatient Encounter Medications as of 09/19/2022  Medication Sig   albuterol (VENTOLIN HFA) 108 (90 Base) MCG/ACT inhaler Inhale 2 puffs into the lungs every 6 (six) hours as needed for wheezing or shortness of breath.   amLODipine (NORVASC) 2.5 MG tablet Take 1 tablet (2.5 mg total) by mouth daily.   aspirin 81 MG tablet Take 81 mg by mouth daily.   atorvastatin (LIPITOR) 20 MG tablet TAKE 1 TABLET BY MOUTH AT  BEDTIME   azithromycin (ZITHROMAX) 250 MG tablet Take one tab a day for 10 days for uri   benzonatate (TESSALON) 200 MG capsule Take 1 capsule (200 mg total) by mouth 2 (two) times daily as needed for cough.   chlorpheniramine-HYDROcodone (TUSSIONEX) 10-8 MG/5ML Take 5 mLs by mouth every 12 (twelve) hours as needed for cough.   cholecalciferol (VITAMIN D3) 25 MCG (1000 UT) tablet Take 1,000 Units by mouth daily.   Continuous Blood Gluc Receiver (FREESTYLE LIBRE 2 READER) DEVI USE AS DIRECTED   Continuous Blood Gluc Sensor (FREESTYLE LIBRE 2 SENSOR) MISC 1 Device by Does not apply route every 14 (fourteen) days.   feeding supplement, GLUCERNA SHAKE, (GLUCERNA SHAKE) LIQD One a day   ferrous sulfate 324 MG TBEC Take by mouth.   furosemide  (LASIX) 20 MG tablet Take 1 tablet (20 mg total) by mouth daily.   gabapentin (NEURONTIN) 300 MG capsule TAKE 1 CAPSULE BY MOUTH  DAILY AND 1 CAPSULE BY  MOUTH AT NIGHT   glucose blood test strip 1 each by Other route in the morning, at noon, in the evening, and at bedtime. Use as instructed   HUMALOG KWIKPEN 100 UNIT/ML KwikPen Inject 6-10 Units into the skin See admin instructions. Inject 6u under the skin daily at breakfast-time, 8u at lunch-time and inject 10u under the skin daily at dinner-time   hydrocortisone 1 % ointment Apply small amount mix with lotion at night for leg rash   ipratropium-albuterol (DUONEB) 0.5-2.5 (3) MG/3ML SOLN USE 3 ML VIA NEBULIZER EVERY 6 HOURS AS NEEDED   loratadine (CLARITIN) 10 MG tablet Take 1 tablet (10 mg total) by mouth daily.   losartan (COZAAR) 25 MG tablet Take 1 tablet (25 mg total) by mouth daily.   Magnesium 250 MG TABS Take 250 mg by mouth 2 (two) times daily.   montelukast (SINGULAIR) 10 MG tablet TAKE 1 TABLET BY MOUTH DAILY   mupirocin ointment (BACTROBAN) 2 % Apply 1 Application topically 2 (two) times daily. To superficial scratches on legs until resolved.   niacin (NIASPAN) 500 MG CR tablet niacin ER 500 mg tablet,extended release 24 hr   senna (SENOKOT) 8.6 MG tablet Take 1 tablet (8.6 mg total) by mouth daily.   simethicone (MYLICON) 80 MG chewable tablet  Chew 2 tablets (160 mg total) by mouth 2 (two) times daily.   Tiotropium Bromide-Olodaterol (STIOLTO RESPIMAT) 2.5-2.5 MCG/ACT AERS Inhale 1 Inhalation into the lungs daily.   TOUJEO SOLOSTAR 300 UNIT/ML SOPN Inject 25 Units into the skin at bedtime.    vitamin B-12 (CYANOCOBALAMIN) 1000 MCG tablet Take 1,000 mcg by mouth daily.   No facility-administered encounter medications on file as of 09/19/2022.    Surgical History: Past Surgical History:  Procedure Laterality Date   APPENDECTOMY     COLONOSCOPY WITH PROPOFOL N/A 06/11/2015   Procedure: COLONOSCOPY WITH PROPOFOL;  Surgeon: Manya Silvas, MD;  Location: [facility] ENDOSCOPY;  Service: Endoscopy;  Laterality: N/A;   CORONARY ARTERY BYPASS GRAFT     HERNIA REPAIR     TEE WITHOUT CARDIOVERSION     TRACHEOSTOMY     VASCULAR SURGERY      Medical History: Past Medical History:  Diagnosis Date   Anginal pain ([place])    Asthma    Coronary artery disease    Diabetes mellitus without complication ([place])    Hyperlipidemia    Hypertension    Sleep apnea     Family History: Family History  Problem Relation Age of Onset   Cancer Sister    Diabetes Daughter    Diabetes Son     Social History   Socioeconomic History   Marital status: Married    Spouse name: Not on file   Number of children: Not on file   Years of education: Not on file   Highest education level: Not on file  Occupational History   Not on file  Tobacco Use   Smoking status: Never   Smokeless tobacco: Never  Vaping Use   Vaping Use: Never used  Substance and Sexual Activity   Alcohol use: No   Drug use: No   Sexual activity: Not on file  Other Topics Concern   Not on file  Social History Narrative   Not on file   Social Determinants of Health   Financial Resource Strain: Not on file  Food Insecurity: Not on file  Transportation Needs: Not on file  Physical Activity: Not on file  Stress: Not on file  Social Connections: Not on file  Intimate Partner Violence: Not on file      Review of Systems  Constitutional:  Negative for fatigue and fever.  HENT:  Positive for postnasal drip. Negative for congestion, rhinorrhea, sinus pressure, sinus pain, sneezing and sore throat.   Respiratory:  Positive for cough, chest tightness and shortness of breath. Negative for wheezing.   Cardiovascular: Negative.  Negative for chest pain and palpitations.  Gastrointestinal: Negative.   Musculoskeletal:  Positive for arthralgias and gait problem.  Neurological:  Negative for headaches.    Vital Signs: BP 120/68 Comment: 150/76  Pulse 76   Temp  (!) 97.5 F (36.4 C)   Resp 16   Ht '5\' 6"'$  (1.676 m)   Wt 168 lb 3.2 oz (76.3 kg)   SpO2 97%   BMI 27.15 kg/m    Physical Exam Vitals reviewed.  Constitutional:      General: Daniel Hodges is not in acute distress.    Appearance: Normal appearance. Daniel Hodges is not ill-appearing.  HENT:     Head: Normocephalic and atraumatic.  Eyes:     Pupils: Pupils are equal, round, and reactive to light.  Cardiovascular:     Rate and Rhythm: Normal rate.     Heart sounds: Normal heart sounds. No murmur heard.  Pulmonary:     Effort: Pulmonary effort is normal. No respiratory distress.     Breath sounds: Normal breath sounds.  Skin:    Findings: Abrasion (left lower leg superficial abrasions from scratching) present.  Neurological:     Mental Status: Daniel Hodges is alert and oriented to person, place, and time.  Psychiatric:        Mood and Affect: Mood normal.        Behavior: Behavior normal.        Assessment/Plan: 1. Chronic cough Chest xray and PFT ordered to further evaluate possible causes for chronic cough - Pulmonary function test; Future - DG Chest 2 View; Future  2. Moderate persistent asthma without complication Chest xray and PFT ordered to further evaluate for worsening of asthma or development of COPD - Pulmonary function test; Future - DG Chest 2 View; Future  3. B12 deficiency Repeat lab to check level. If level is ok, can stop B12 injections and take OTC oral supplement if desired. 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 Superficial injury of skin May apply antibiotic ointment to scratches to help prevent infection as they heal.  - mupirocin ointment (BACTROBAN) 2 %; Apply 1 Application topically 2 (two) times daily. To superficial scratches on legs until resolved.  Dispense: 30 g; Refill: 2   General Counseling: Daniel Hodges verbalizes understanding of the findings of todays visit and agrees with  plan of treatment. I have discussed any further diagnostic evaluation that may be needed or ordered today. We also reviewed his medications today. Daniel Hodges has been encouraged to call the office with any questions or concerns that should arise related to todays visit.    Orders Placed This Encounter  Procedures   DG Chest 2 View   B12 and Folate Panel   Pulmonary function test    Meds ordered this encounter  Medications   mupirocin ointment (BACTROBAN) 2 %    Sig: Apply 1 Application topically 2 (two) times daily. To superficial scratches on legs until resolved.    Dispense:  30 g    Refill:  2    Return in about 3 weeks (around 10/10/2022) for F/U, PFT @ [facility] P[facility]   .   Total time spent:30 Minutes Time spent includes review of chart, medications, test results, and follow up plan with the patient.   [place] Controlled Substance Database was reviewed by me.  This patient was seen by Jonetta Osgood, FNP-C in collaboration with Dr. Clayborn Bigness as a part of collaborative care agreement.   Chinelo Benn R. Valetta Fuller, MSN, FNP-C Internal medicine

## 2022-09-20 ENCOUNTER — Encounter: Payer: Self-pay | Admitting: Nurse Practitioner

## 2022-09-20 LAB — B12 AND FOLATE PANEL
Folate: 9.3 ng/mL (ref >3.0)
Vitamin B-12: 1195 pg/mL (ref 232–1245)

## 2022-09-24 ENCOUNTER — Ambulatory Visit: Payer: Medicare Other | Admitting: Internal Medicine

## 2022-09-24 DIAGNOSIS — R053 Chronic cough: Secondary | ICD-10-CM

## 2022-09-24 DIAGNOSIS — J4540 Moderate persistent asthma, uncomplicated: Secondary | ICD-10-CM

## 2022-10-10 ENCOUNTER — Ambulatory Visit (INDEPENDENT_AMBULATORY_CARE_PROVIDER_SITE_OTHER): Payer: Medicare Other | Admitting: Nurse Practitioner

## 2022-10-10 ENCOUNTER — Encounter: Payer: Self-pay | Admitting: Nurse Practitioner

## 2022-10-10 VITALS — BP 131/67 | HR 64 | Temp 98.1°F | Resp 16 | Ht 66.0 in | Wt 166.0 lb

## 2022-10-10 DIAGNOSIS — R053 Chronic cough: Secondary | ICD-10-CM | POA: Diagnosis not present

## 2022-10-10 DIAGNOSIS — J449 Chronic obstructive pulmonary disease, unspecified: Secondary | ICD-10-CM | POA: Diagnosis not present

## 2022-10-10 DIAGNOSIS — I10 Essential (primary) hypertension: Secondary | ICD-10-CM

## 2022-10-10 DIAGNOSIS — E1142 Type 2 diabetes mellitus with diabetic polyneuropathy: Secondary | ICD-10-CM | POA: Diagnosis not present

## 2022-10-10 DIAGNOSIS — E538 Deficiency of other specified B group vitamins: Secondary | ICD-10-CM

## 2022-10-10 DIAGNOSIS — J4520 Mild intermittent asthma, uncomplicated: Secondary | ICD-10-CM

## 2022-10-10 MED ORDER — IPRATROPIUM-ALBUTEROL 0.5-2.5 (3) MG/3ML IN SOLN: RESPIRATORY_TRACT | 1 refills | Status: DC

## 2022-10-10 MED ORDER — AMLODIPINE BESYLATE 2.5 MG PO TABS: 2.5000 mg | ORAL_TABLET | Freq: Every day | ORAL | 3 refills | Status: DC

## 2022-10-10 MED ORDER — GABAPENTIN 300 MG PO CAPS: ORAL_CAPSULE | ORAL | 2 refills | Status: DC

## 2022-10-10 NOTE — Progress Notes (Unsigned)
[facility] [place], [place] [address]  Internal MEDICINE  Office Visit Note  Patient Name: Daniel Hodges  [id number]  [id number]  Date of Service: 10/11/2022  Chief Complaint  Patient presents with   Follow-up    Review pft     HPI Daniel Hodges presents for a follow-up visit for COPD, low B12, and arthritis.  COPD -- PFT reviewd with patient and wife. 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 Still has not had the chest xray done yet.  B12 deficiency -- labs reviewed, no longer need B12 injection, level looks good.  Arthritis -- takes aleve which helps .     Current Medication: Outpatient Encounter Medications as of 10/10/2022  Medication Sig   albuterol (VENTOLIN HFA) 108 (90 Base) MCG/ACT inhaler Inhale 2 puffs into the lungs every 6 (six) hours as needed for wheezing or shortness of breath.   aspirin 81 MG tablet Take 81 mg by mouth daily.   atorvastatin (LIPITOR) 20 MG tablet TAKE 1 TABLET BY MOUTH AT  BEDTIME   benzonatate (TESSALON) 200 MG capsule Take 1 capsule (200 mg total) by mouth 2 (two) times daily as needed for cough.   chlorpheniramine-HYDROcodone (TUSSIONEX) 10-8 MG/5ML Take 5 mLs by mouth every 12 (twelve) hours as needed for cough.   cholecalciferol (VITAMIN D3) 25 MCG (1000 UT) tablet Take 1,000 Units by mouth daily.   Continuous Blood Gluc Receiver (FREESTYLE LIBRE 2 READER) DEVI USE AS DIRECTED   Continuous Blood Gluc Sensor (FREESTYLE LIBRE 2 SENSOR) MISC 1 Device by Does not apply route every 14 (fourteen) days.   feeding supplement, GLUCERNA SHAKE, (GLUCERNA SHAKE) LIQD One a day   ferrous sulfate 324 MG TBEC Take by mouth.   furosemide (LASIX) 20 MG tablet Take 1 tablet (20 mg total) by mouth daily.   glucose blood test strip 1 each by Other route in the morning, at noon, in the evening, and at bedtime. Use as instructed   HUMALOG KWIKPEN 100 UNIT/ML KwikPen  Inject 6-10 Units into the skin See admin instructions. Inject 6u under the skin daily at breakfast-time, 8u at lunch-time and inject 10u under the skin daily at dinner-time   hydrocortisone 1 % ointment Apply small amount mix with lotion at night for leg rash   loratadine (CLARITIN) 10 MG tablet Take 1 tablet (10 mg total) by mouth daily.   losartan (COZAAR) 25 MG tablet Take 1 tablet (25 mg total) by mouth daily.   Magnesium 250 MG TABS Take 250 mg by mouth 2 (two) times daily.   montelukast (SINGULAIR) 10 MG tablet TAKE 1 TABLET BY MOUTH DAILY   mupirocin ointment (BACTROBAN) 2 % Apply 1 Application topically 2 (two) times daily. To superficial scratches on legs until resolved.   niacin (NIASPAN) 500 MG CR tablet niacin ER 500 mg tablet,extended release 24 hr   senna (SENOKOT) 8.6 MG tablet Take 1 tablet (8.6 mg total) by mouth daily.   simethicone (MYLICON) 80 MG chewable tablet Chew 2 tablets (160 mg total) by mouth 2 (two) times daily.   Tiotropium Bromide-Olodaterol (STIOLTO RESPIMAT) 2.5-2.5 MCG/ACT AERS Inhale 1 Inhalation into the lungs daily.   TOUJEO SOLOSTAR 300 UNIT/ML SOPN Inject 25 Units into the skin at bedtime.    vitamin B-12 (CYANOCOBALAMIN) 1000 MCG tablet Take 1,000 mcg by mouth daily.   [DISCONTINUED] amLODipine (NORVASC) 2.5 MG tablet Take 1 tablet (2.5 mg total) by mouth daily.   [DISCONTINUED] azithromycin (ZITHROMAX) 250  MG tablet Take one tab a day for 10 days for uri   [DISCONTINUED] gabapentin (NEURONTIN) 300 MG capsule TAKE 1 CAPSULE BY MOUTH  DAILY AND 1 CAPSULE BY  MOUTH AT NIGHT   [DISCONTINUED] ipratropium-albuterol (DUONEB) 0.5-2.5 (3) MG/3ML SOLN USE 3 ML VIA NEBULIZER EVERY 6 HOURS AS NEEDED   amLODipine (NORVASC) 2.5 MG tablet Take 1 tablet (2.5 mg total) by mouth daily.   gabapentin (NEURONTIN) 300 MG capsule TAKE 1 CAPSULE BY MOUTH  DAILY AND 1 CAPSULE BY  MOUTH AT NIGHT   ipratropium-albuterol (DUONEB) 0.5-2.5 (3) MG/3ML SOLN USE 3 ML VIA NEBULIZER EVERY 6  HOURS AS NEEDED   No facility-administered encounter medications on file as of 10/10/2022.    Surgical History: Past Surgical History:  Procedure Laterality Date   APPENDECTOMY     COLONOSCOPY WITH PROPOFOL N/A 06/11/2015   Procedure: COLONOSCOPY WITH PROPOFOL;  Surgeon: Manya Silvas, MD;  Location: [facility]COPY;  Service: Endoscopy;  Laterality: N/A;   CORONARY ARTERY BYPASS GRAFT     HERNIA REPAIR     TEE WITHOUT CARDIOVERSION     TRACHEOSTOMY     VASCULAR SURGERY      Medical History: Past Medical History:  Diagnosis Date   Anginal pain ([place])    Asthma    Coronary artery disease    Diabetes mellitus without complication ([place])    Hyperlipidemia    Hypertension    Sleep apnea     Family History: Family History  Problem Relation Age of Onset   Cancer Sister    Diabetes Daughter    Diabetes Son     Social History   Socioeconomic History   Marital status: Married    Spouse name: Not on file   Number of children: Not on file   Years of education: Not on file   Highest education level: Not on file  Occupational History   Not on file  Tobacco Use   Smoking status: Never   Smokeless tobacco: Never  Vaping Use   Vaping Use: Never used  Substance and Sexual Activity   Alcohol use: No   Drug use: No   Sexual activity: Not on file  Other Topics Concern   Not on file  Social History Narrative   Not on file   Social Determinants of Health   Financial Resource Strain: Not on file  Food Insecurity: Not on file  Transportation Needs: Not on file  Physical Activity: Not on file  Stress: Not on file  Social Connections: Not on file  Intimate Partner Violence: Not on file      Review of Systems  Constitutional:  Negative for fatigue and fever.  HENT:  Positive for postnasal drip. Negative for congestion, rhinorrhea, sinus pressure, sinus pain, sneezing and sore throat.   Respiratory:  Positive for cough, chest tightness and shortness of breath. Negative  for wheezing.   Cardiovascular: Negative.  Negative for chest pain and palpitations.  Gastrointestinal: Negative.   Musculoskeletal:  Positive for arthralgias and gait problem.  Neurological:  Negative for headaches.    Vital Signs: BP 131/67   Pulse 64   Temp 98.1 F (36.7 C)   Resp 16   Ht '5\' 6"'$  (1.676 m)   Wt 166 lb (75.3 kg)   SpO2 98%   BMI 26.79 kg/m    Physical Exam Vitals reviewed.  Constitutional:      General: He is not in acute distress.    Appearance: Normal appearance. He is not ill-appearing.  HENT:     Head: Normocephalic and atraumatic.  Eyes:     Pupils: Pupils are equal, round, and reactive to light.  Cardiovascular:     Rate and Rhythm: Normal rate.     Heart sounds: Normal heart sounds. No murmur heard. Pulmonary:     Effort: Pulmonary effort is normal. No respiratory distress.     Breath sounds: Normal breath sounds.  Neurological:     Mental Status: He is alert and oriented to person, place, and time.  Psychiatric:        Mood and Affect: Mood normal.        Behavior: Behavior normal.        Assessment/Plan: 1. Essential hypertension Stable, continue amlodipine as prescribed.  - amLODipine (NORVASC) 2.5 MG tablet; Take 1 tablet (2.5 mg total) by mouth daily.  Dispense: 90 tablet; Refill: 3  2. Diabetic polyneuropathy associated with type 2 diabetes mellitus (HCC) Stable, refills ordered - gabapentin (NEURONTIN) 300 MG capsule; TAKE 1 CAPSULE BY MOUTH  DAILY AND 1 CAPSULE BY  MOUTH AT NIGHT  Dispense: 200 capsule; Refill: 2  3. B12 deficiency Does not need injections anymore, level is good. Can't take oral OTC supplement  4. Chronic cough Most likely a symptoms of his COPD  5. COPD mixed type (HCC) Continue maintenance inhaler and prn rescue inhaler - ipratropium-albuterol (DUONEB) 0.5-2.5 (3) MG/3ML SOLN; USE 3 ML VIA NEBULIZER EVERY 6 HOURS AS NEEDED  Dispense: 1080 mL; Refill: 1   General Counseling: Chief verbalizes  understanding of the findings of todays visit and agrees with plan of treatment. I have discussed any further diagnostic evaluation that may be needed or ordered today. We also reviewed his medications today. he has been encouraged to call the office with any questions or concerns that should arise related to todays visit.    No orders of the defined types were placed in this encounter.   Meds ordered this encounter  Medications   amLODipine (NORVASC) 2.5 MG tablet    Sig: Take 1 tablet (2.5 mg total) by mouth daily.    Dispense:  90 tablet    Refill:  3    Requesting 1 year supply   gabapentin (NEURONTIN) 300 MG capsule    Sig: TAKE 1 CAPSULE BY MOUTH  DAILY AND 1 CAPSULE BY  MOUTH AT NIGHT    Dispense:  200 capsule    Refill:  2    Requesting 1 year supply   ipratropium-albuterol (DUONEB) 0.5-2.5 (3) MG/3ML SOLN    Sig: USE 3 ML VIA NEBULIZER EVERY 6 HOURS AS NEEDED    Dispense:  1080 mL    Refill:  1    For future refills    Return in about 3 months (around 01/09/2023) for F/U, Marbeth Smedley PCP.   Total time spent:30 Minutes Time spent includes review of chart, medications, test results, and follow up plan with the patient.   [place] Controlled Substance Database was reviewed by me.  This patient was seen by Jonetta Osgood, FNP-C in collaboration with Dr. Clayborn Bigness as a part of collaborative care agreement.   Galileah Piggee R. Valetta Fuller, MSN, FNP-C Internal medicine

## 2022-10-11 ENCOUNTER — Encounter: Payer: Self-pay | Admitting: Nurse Practitioner

## 2022-10-13 ENCOUNTER — Ambulatory Visit
Admission: RE | Admit: 2022-10-13 | Discharge: 2022-10-13 | Disposition: A | Discharge status: Home / Self Care | Payer: Medicare Other | Attending: Nurse Practitioner | Admitting: Nurse Practitioner

## 2022-10-13 ENCOUNTER — Ambulatory Visit
Admission: RE | Admit: 2022-10-13 | Discharge: 2022-10-13 | Disposition: A | Discharge status: Home / Self Care | Payer: Medicare Other | Source: Ambulatory Visit | Attending: Nurse Practitioner | Admitting: Nurse Practitioner

## 2022-10-13 DIAGNOSIS — J4540 Moderate persistent asthma, uncomplicated: Secondary | ICD-10-CM

## 2022-10-13 DIAGNOSIS — R053 Chronic cough: Secondary | ICD-10-CM | POA: Insufficient documentation

## 2022-10-31 ENCOUNTER — Emergency Department: Payer: Medicare Other

## 2022-10-31 ENCOUNTER — Telehealth: Payer: Self-pay

## 2022-10-31 ENCOUNTER — Encounter: Payer: Self-pay | Admitting: Internal Medicine

## 2022-10-31 ENCOUNTER — Observation Stay
Admission: EM | Admit: 2022-10-31 | Discharge: 2022-11-02 | Disposition: A | Discharge status: Home / Self Care | Payer: Medicare Other | Attending: Internal Medicine | Admitting: Internal Medicine

## 2022-10-31 ENCOUNTER — Other Ambulatory Visit: Payer: Self-pay

## 2022-10-31 DIAGNOSIS — N179 Acute kidney failure, unspecified: Secondary | ICD-10-CM | POA: Diagnosis not present

## 2022-10-31 DIAGNOSIS — E1159 Type 2 diabetes mellitus with other circulatory complications: Secondary | ICD-10-CM | POA: Diagnosis present

## 2022-10-31 DIAGNOSIS — I10 Essential (primary) hypertension: Secondary | ICD-10-CM | POA: Diagnosis not present

## 2022-10-31 DIAGNOSIS — G9341 Metabolic encephalopathy: Secondary | ICD-10-CM

## 2022-10-31 DIAGNOSIS — R4182 Altered mental status, unspecified: Secondary | ICD-10-CM | POA: Diagnosis present

## 2022-10-31 DIAGNOSIS — Z794 Long term (current) use of insulin: Secondary | ICD-10-CM | POA: Diagnosis not present

## 2022-10-31 DIAGNOSIS — U071 COVID-19: Secondary | ICD-10-CM | POA: Insufficient documentation

## 2022-10-31 DIAGNOSIS — R6889 Other general symptoms and signs: Secondary | ICD-10-CM | POA: Diagnosis not present

## 2022-10-31 DIAGNOSIS — J4489 Other specified chronic obstructive pulmonary disease: Secondary | ICD-10-CM | POA: Insufficient documentation

## 2022-10-31 DIAGNOSIS — E871 Hypo-osmolality and hyponatremia: Secondary | ICD-10-CM

## 2022-10-31 DIAGNOSIS — R7989 Other specified abnormal findings of blood chemistry: Secondary | ICD-10-CM | POA: Diagnosis not present

## 2022-10-31 DIAGNOSIS — Z79899 Other long term (current) drug therapy: Secondary | ICD-10-CM | POA: Diagnosis not present

## 2022-10-31 DIAGNOSIS — J449 Chronic obstructive pulmonary disease, unspecified: Secondary | ICD-10-CM | POA: Diagnosis not present

## 2022-10-31 DIAGNOSIS — I2510 Atherosclerotic heart disease of native coronary artery without angina pectoris: Secondary | ICD-10-CM | POA: Diagnosis not present

## 2022-10-31 DIAGNOSIS — J9600 Acute respiratory failure, unspecified whether with hypoxia or hypercapnia: Secondary | ICD-10-CM | POA: Diagnosis not present

## 2022-10-31 DIAGNOSIS — Z7982 Long term (current) use of aspirin: Secondary | ICD-10-CM | POA: Insufficient documentation

## 2022-10-31 DIAGNOSIS — J45909 Unspecified asthma, uncomplicated: Secondary | ICD-10-CM | POA: Insufficient documentation

## 2022-10-31 DIAGNOSIS — E162 Hypoglycemia, unspecified: Secondary | ICD-10-CM | POA: Diagnosis not present

## 2022-10-31 DIAGNOSIS — R6520 Severe sepsis without septic shock: Secondary | ICD-10-CM | POA: Diagnosis not present

## 2022-10-31 DIAGNOSIS — E875 Hyperkalemia: Secondary | ICD-10-CM | POA: Diagnosis present

## 2022-10-31 DIAGNOSIS — G4733 Obstructive sleep apnea (adult) (pediatric): Secondary | ICD-10-CM

## 2022-10-31 DIAGNOSIS — E1165 Type 2 diabetes mellitus with hyperglycemia: Secondary | ICD-10-CM

## 2022-10-31 DIAGNOSIS — I493 Ventricular premature depolarization: Secondary | ICD-10-CM

## 2022-10-31 DIAGNOSIS — I499 Cardiac arrhythmia, unspecified: Secondary | ICD-10-CM | POA: Diagnosis not present

## 2022-10-31 DIAGNOSIS — A419 Sepsis, unspecified organism: Secondary | ICD-10-CM | POA: Diagnosis not present

## 2022-10-31 DIAGNOSIS — R0689 Other abnormalities of breathing: Secondary | ICD-10-CM | POA: Diagnosis not present

## 2022-10-31 DIAGNOSIS — R000 Tachycardia, unspecified: Secondary | ICD-10-CM | POA: Diagnosis not present

## 2022-10-31 DIAGNOSIS — Z743 Need for continuous supervision: Secondary | ICD-10-CM | POA: Diagnosis not present

## 2022-10-31 DIAGNOSIS — I152 Hypertension secondary to endocrine disorders: Secondary | ICD-10-CM | POA: Diagnosis present

## 2022-10-31 HISTORY — DX: Sepsis, unspecified organism: A41.9

## 2022-10-31 HISTORY — DX: Metabolic encephalopathy: G93.41

## 2022-10-31 LAB — COMPREHENSIVE METABOLIC PANEL
ALT: 20 U/L (ref 0–44)
AST: 32 U/L (ref 15–41)
Albumin: 3.6 g/dL (ref 3.5–5.0)
Alkaline Phosphatase: 105 U/L (ref 38–126)
Anion gap: 8 (ref 5–15)
BUN: 23 mg/dL (ref 8–23)
CO2: 23 mmol/L (ref 22–32)
Calcium: 7.9 mg/dL — ABNORMAL LOW (ref 8.9–10.3)
Chloride: 96 mmol/L — ABNORMAL LOW (ref 98–111)
Creatinine, Ser: 2.01 mg/dL — ABNORMAL HIGH (ref 0.61–1.24)
GFR, Estimated: 33 mL/min — ABNORMAL LOW (ref >60)
Glucose, Bld: 223 mg/dL — ABNORMAL HIGH (ref 70–99)
Potassium: 5.3 mmol/L — ABNORMAL HIGH (ref 3.5–5.1)
Sodium: 127 mmol/L — ABNORMAL LOW (ref 135–145)
Total Bilirubin: 0.7 mg/dL (ref 0.3–1.2)
Total Protein: 6.9 g/dL (ref 6.5–8.1)

## 2022-10-31 LAB — CBC WITH DIFFERENTIAL/PLATELET
Abs Immature Granulocytes: 0.08 10*3/uL — ABNORMAL HIGH (ref 0.00–0.07)
Basophils Absolute: 0 10*3/uL (ref 0.0–0.1)
Basophils Relative: 0 %
Eosinophils Absolute: 0 10*3/uL (ref 0.0–0.5)
Eosinophils Relative: 0 %
HCT: 35.6 % — ABNORMAL LOW (ref 39.0–52.0)
Hemoglobin: 11.5 g/dL — ABNORMAL LOW (ref 13.0–17.0)
Immature Granulocytes: 1 %
Lymphocytes Relative: 2 %
Lymphs Abs: 0.2 10*3/uL — ABNORMAL LOW (ref 0.7–4.0)
MCH: 27.3 pg (ref 26.0–34.0)
MCHC: 32.3 g/dL (ref 30.0–36.0)
MCV: 84.4 fL (ref 80.0–100.0)
Monocytes Absolute: 1.2 10*3/uL — ABNORMAL HIGH (ref 0.1–1.0)
Monocytes Relative: 11 %
Neutro Abs: 9.1 10*3/uL — ABNORMAL HIGH (ref 1.7–7.7)
Neutrophils Relative %: 86 %
Platelets: 170 10*3/uL (ref 150–400)
RBC: 4.22 MIL/uL (ref 4.22–5.81)
RDW: 13.7 % (ref 11.5–15.5)
WBC: 10.6 10*3/uL — ABNORMAL HIGH (ref 4.0–10.5)
nRBC: 0 % (ref 0.0–0.2)

## 2022-10-31 LAB — HEMOGLOBIN A1C
Hgb A1c MFr Bld: 7.6 % — ABNORMAL HIGH (ref 4.8–5.6)
Mean Plasma Glucose: 171.42 mg/dL

## 2022-10-31 LAB — URINALYSIS, COMPLETE (UACMP) WITH MICROSCOPIC
Bilirubin Urine: NEGATIVE
Glucose, UA: NEGATIVE mg/dL
Ketones, ur: NEGATIVE mg/dL
Leukocytes,Ua: NEGATIVE
Nitrite: NEGATIVE
Protein, ur: 100 mg/dL — AB
Specific Gravity, Urine: 1.018 (ref 1.005–1.030)
pH: 5 (ref 5.0–8.0)

## 2022-10-31 LAB — CBG MONITORING, ED
Glucose-Capillary: 130 mg/dL — ABNORMAL HIGH (ref 70–99)
Glucose-Capillary: 280 mg/dL — ABNORMAL HIGH (ref 70–99)
Glucose-Capillary: 191 mg/dL — ABNORMAL HIGH (ref 70–99)
Glucose-Capillary: 195 mg/dL — ABNORMAL HIGH (ref 70–99)

## 2022-10-31 LAB — PROCALCITONIN: Procalcitonin: 0.12 ng/mL

## 2022-10-31 LAB — PROTIME-INR
INR: 1.3 — ABNORMAL HIGH (ref 0.8–1.2)
Prothrombin Time: 16.4 seconds — ABNORMAL HIGH (ref 11.4–15.2)

## 2022-10-31 LAB — RESP PANEL BY RT-PCR (RSV, FLU A&B, COVID)  RVPGX2
Influenza A by PCR: NEGATIVE
Influenza B by PCR: NEGATIVE
Resp Syncytial Virus by PCR: NEGATIVE
SARS Coronavirus 2 by RT PCR: POSITIVE — AB

## 2022-10-31 LAB — TROPONIN I (HIGH SENSITIVITY)
Troponin I (High Sensitivity): 48 ng/L — ABNORMAL HIGH (ref <18)
Troponin I (High Sensitivity): 45 ng/L — ABNORMAL HIGH (ref <18)

## 2022-10-31 LAB — BASIC METABOLIC PANEL
Anion gap: 9 (ref 5–15)
BUN: 22 mg/dL (ref 8–23)
CO2: 20 mmol/L — ABNORMAL LOW (ref 22–32)
Calcium: 7.8 mg/dL — ABNORMAL LOW (ref 8.9–10.3)
Chloride: 100 mmol/L (ref 98–111)
Creatinine, Ser: 1.15 mg/dL (ref 0.61–1.24)
GFR, Estimated: >60 mL/min (ref >60)
Glucose, Bld: 214 mg/dL — ABNORMAL HIGH (ref 70–99)
Potassium: 4.7 mmol/L (ref 3.5–5.1)
Sodium: 129 mmol/L — ABNORMAL LOW (ref 135–145)

## 2022-10-31 LAB — GLUCOSE, CAPILLARY: Glucose-Capillary: 118 mg/dL — ABNORMAL HIGH (ref 70–99)

## 2022-10-31 LAB — MAGNESIUM: Magnesium: 2 mg/dL (ref 1.7–2.4)

## 2022-10-31 LAB — LACTIC ACID, PLASMA: Lactic Acid, Venous: 1.7 mmol/L (ref 0.5–1.9)

## 2022-10-31 LAB — APTT: aPTT: 26 seconds (ref 24–36)

## 2022-10-31 MED ORDER — SODIUM CHLORIDE 0.9 % IV SOLN
2.0000 g | Freq: Once | INTRAVENOUS | Status: AC
  Administered 2022-10-31: 2 g via INTRAVENOUS
  Filled 2022-10-31: qty 2

## 2022-10-31 MED ORDER — METHYLPREDNISOLONE SODIUM SUCC 40 MG IJ SOLR
0.5000 mg/kg | Freq: Two times a day (BID) | INTRAMUSCULAR | Status: DC
  Administered 2022-10-31 – 2022-11-01 (×3): 37.6 mg via INTRAVENOUS
  Filled 2022-10-31 (×3): qty 1

## 2022-10-31 MED ORDER — LACTATED RINGERS IV SOLN: INTRAVENOUS | Status: AC

## 2022-10-31 MED ORDER — ACETAMINOPHEN 500 MG PO TABS
1000.0000 mg | ORAL_TABLET | Freq: Once | ORAL | Status: AC
  Administered 2022-10-31: 1000 mg via ORAL
  Filled 2022-10-31: qty 2

## 2022-10-31 MED ORDER — METRONIDAZOLE 500 MG/100ML IV SOLN
500.0000 mg | Freq: Once | INTRAVENOUS | Status: AC
  Administered 2022-10-31: 500 mg via INTRAVENOUS
  Filled 2022-10-31: qty 100

## 2022-10-31 MED ORDER — LACTATED RINGERS IV BOLUS
500.0000 mL | Freq: Once | INTRAVENOUS | Status: AC
  Administered 2022-10-31: 500 mL via INTRAVENOUS

## 2022-10-31 MED ORDER — VANCOMYCIN HCL IN DEXTROSE 1-5 GM/200ML-% IV SOLN: 1000.0000 mg | Freq: Once | INTRAVENOUS | Status: DC

## 2022-10-31 MED ORDER — INSULIN ASPART 100 UNIT/ML IJ SOLN
0.0000 [IU] | INTRAMUSCULAR | Status: DC
  Administered 2022-10-31: 3 [IU] via SUBCUTANEOUS
  Administered 2022-10-31: 5 [IU] via SUBCUTANEOUS
  Administered 2022-10-31: 2 [IU] via SUBCUTANEOUS
  Administered 2022-10-31: 8 [IU] via SUBCUTANEOUS
  Administered 2022-10-31: 3 [IU] via SUBCUTANEOUS
  Administered 2022-11-01: 11 [IU] via SUBCUTANEOUS
  Administered 2022-11-01: 2 [IU] via SUBCUTANEOUS
  Administered 2022-11-02: 3 [IU] via SUBCUTANEOUS
  Filled 2022-10-31 (×7): qty 1

## 2022-10-31 MED ORDER — VANCOMYCIN HCL 1750 MG/350ML IV SOLN
1750.0000 mg | Freq: Once | INTRAVENOUS | Status: AC
  Administered 2022-10-31: 1750 mg via INTRAVENOUS
  Filled 2022-10-31: qty 350

## 2022-10-31 MED ORDER — PREDNISONE 50 MG PO TABS: 50.0000 mg | ORAL_TABLET | Freq: Every day | ORAL | Status: DC

## 2022-10-31 MED ORDER — ENOXAPARIN SODIUM 30 MG/0.3ML IJ SOSY
30.0000 mg | PREFILLED_SYRINGE | INTRAMUSCULAR | Status: DC
  Administered 2022-10-31: 30 mg via SUBCUTANEOUS
  Filled 2022-10-31: qty 0.3

## 2022-10-31 MED ORDER — LACTATED RINGERS IV BOLUS (SEPSIS)
1000.0000 mL | Freq: Once | INTRAVENOUS | Status: AC
  Administered 2022-10-31: 1000 mL via INTRAVENOUS

## 2022-10-31 MED ORDER — ONDANSETRON HCL 4 MG PO TABS: 4.0000 mg | ORAL_TABLET | Freq: Four times a day (QID) | ORAL | Status: DC | PRN

## 2022-10-31 MED ORDER — LACTATED RINGERS IV BOLUS
1000.0000 mL | Freq: Once | INTRAVENOUS | Status: AC
  Administered 2022-10-31: 1000 mL via INTRAVENOUS

## 2022-10-31 MED ORDER — ALBUTEROL SULFATE (2.5 MG/3ML) 0.083% IN NEBU
2.5000 mg | INHALATION_SOLUTION | Freq: Four times a day (QID) | RESPIRATORY_TRACT | Status: DC
  Administered 2022-10-31 (×3): 2.5 mg via RESPIRATORY_TRACT
  Filled 2022-10-31 (×3): qty 3

## 2022-10-31 MED ORDER — ACETAMINOPHEN 325 MG PO TABS: 650.0000 mg | ORAL_TABLET | Freq: Four times a day (QID) | ORAL | Status: DC | PRN

## 2022-10-31 MED ORDER — ONDANSETRON HCL 4 MG/2ML IJ SOLN: 4.0000 mg | Freq: Four times a day (QID) | INTRAMUSCULAR | Status: DC | PRN

## 2022-10-31 MED ORDER — ACETAMINOPHEN 650 MG RE SUPP: 650.0000 mg | Freq: Four times a day (QID) | RECTAL | Status: DC | PRN

## 2022-10-31 MED ORDER — ENOXAPARIN SODIUM 40 MG/0.4ML IJ SOSY
40.0000 mg | PREFILLED_SYRINGE | INTRAMUSCULAR | Status: DC
  Administered 2022-11-01 – 2022-11-02 (×2): 40 mg via SUBCUTANEOUS
  Filled 2022-10-31 (×2): qty 0.4

## 2022-10-31 MED ORDER — IPRATROPIUM-ALBUTEROL 20-100 MCG/ACT IN AERS
1.0000 | INHALATION_SPRAY | Freq: Four times a day (QID) | RESPIRATORY_TRACT | Status: DC
  Administered 2022-11-01 – 2022-11-02 (×5): 1 via RESPIRATORY_TRACT
  Filled 2022-10-31: qty 4

## 2022-10-31 NOTE — ED Notes (Signed)
Spoke on the phone to patient's daughter to give her an update.  Patient's verbal consent to relay information was obtained.

## 2022-10-31 NOTE — Assessment & Plan Note (Signed)
Multiple PVCs on EKG Keep mag over 2 and potassium over 4

## 2022-10-31 NOTE — H&P (Signed)
History and Physical    Patient: Daniel Hodges [id number] DOB: September 17, 1940 DOA: 10/31/2022 DOS: the patient was seen and examined on 10/31/2022 PCP: Jonetta Osgood, NP  Patient coming from: Home  Chief Complaint:  Chief Complaint  Patient presents with   Altered Mental Status   Fever    HPI: Daniel Hodges is a 82 y.o. male with medical history significant for For CAD, COPD, OSA on CPAP, HTN, insulin-dependent type 2 diabetes, who was brought in by EMS with altered mental status.  Wife reportedly found him lying back on the bed only partially responsive.  On arrival of EMS they found him to have an O2 sat of 84% on room air, temp 102, tachycardic with SBP in the 90s.  He was confused on arrival.  He received an IV fluid bolus and route ED course and data review: Tmax 101.4, tachycardic to 112 O2 sat in the high 90s on room air.  SBP initially 121, dropping to 97.  Labs significant for WBC 10,600 with lactic acid 1.7.  COVID-positive.  Urinalysis unremarkable.  CMP significant for creatinine 2.01, up from baseline of 1.12, sodium 127 and glucose 223. EKG, personally reviewed and interpreted showing sinus tachycardia at 111 with multiple PVCs. CT head nonacute and chest x-ray with no active disease. Patient was treated with sepsis fluid bolus and started on metronidazole vancomycin and aztreonam(penicillin allergy) for sepsis of unknown source prior to COVID result.  Hospitalist consulted for admission. Patient was arousable at the time of admission and was able to answer yes and no but would readily fall back asleep.  BP 114/68 at the time of admission   Review of Systems: As mentioned in the history of present illness. All other systems reviewed and are negative.  Past Medical History:  Diagnosis Date   Anginal pain ([place])    Asthma    Coronary artery disease    Diabetes mellitus without complication ([place])    Hyperlipidemia    Hypertension    Sleep apnea    Past Surgical  History:  Procedure Laterality Date   APPENDECTOMY     COLONOSCOPY WITH PROPOFOL N/A 06/11/2015   Procedure: COLONOSCOPY WITH PROPOFOL;  Surgeon: Manya Silvas, MD;  Location: [facility];  Service: Endoscopy;  Laterality: N/A;   CORONARY ARTERY BYPASS GRAFT     HERNIA REPAIR     TEE WITHOUT CARDIOVERSION     TRACHEOSTOMY     VASCULAR SURGERY     Social History:  reports that he has never smoked. He has never used smokeless tobacco. He reports that he does not drink alcohol and does not use drugs.  Allergies  Allergen Reactions   Penicillins Anaphylaxis   Neostigmine     PEA arrest    Family History  Problem Relation Age of Onset   Cancer Sister    Diabetes Daughter    Diabetes Son     Prior to Admission medications   Medication Sig Start Date End Date Taking? Authorizing Provider  albuterol (VENTOLIN HFA) 108 (90 Base) MCG/ACT inhaler Inhale 2 puffs into the lungs every 6 (six) hours as needed for wheezing or shortness of breath. 03/21/22   Jonetta Osgood, NP  amLODipine (NORVASC) 2.5 MG tablet Take 1 tablet (2.5 mg total) by mouth daily. 10/10/22   Jonetta Osgood, NP  aspirin 81 MG tablet Take 81 mg by mouth daily.    [provider]  atorvastatin (LIPITOR) 20 MG tablet TAKE 1 TABLET BY MOUTH AT  BEDTIME 03/13/22  Jonetta Osgood, NP  benzonatate (TESSALON) 200 MG capsule Take 1 capsule (200 mg total) by mouth 2 (two) times daily as needed for cough. 07/16/22   Jonetta Osgood, NP  chlorpheniramine-HYDROcodone (TUSSIONEX) 10-8 MG/5ML Take 5 mLs by mouth every 12 (twelve) hours as needed for cough. 08/14/22   Jonetta Osgood, NP  cholecalciferol (VITAMIN D3) 25 MCG (1000 UT) tablet Take 1,000 Units by mouth daily.    [provider]  Continuous Blood Gluc Receiver (FREESTYLE LIBRE 2 READER) DEVI USE AS DIRECTED 12/09/21   Jonetta Osgood, NP  Continuous Blood Gluc Sensor (FREESTYLE LIBRE 2 SENSOR) MISC 1 Device by Does not apply route every 14  (fourteen) days. 09/16/22   Jonetta Osgood, NP  feeding supplement, GLUCERNA SHAKE, ([place]) LIQD One a day 10/12/20   McDonough, Lauren K, PA-C  ferrous sulfate 324 MG TBEC Take by mouth. 10/21/13   [provider]  furosemide (LASIX) 20 MG tablet Take 1 tablet (20 mg total) by mouth daily. 03/21/22   Jonetta Osgood, NP  gabapentin (NEURONTIN) 300 MG capsule TAKE 1 CAPSULE BY MOUTH  DAILY AND 1 CAPSULE BY  MOUTH AT NIGHT 10/10/22   Abernathy, Yetta Flock, NP  glucose blood test strip 1 each by Other route in the morning, at noon, in the evening, and at bedtime. Use as instructed 06/20/21   Jonetta Osgood, NP  HUMALOG KWIKPEN 100 UNIT/ML KwikPen Inject 6-10 Units into the skin See admin instructions. Inject 6u under the skin daily at breakfast-time, 8u at lunch-time and inject 10u under the skin daily at dinner-time 11/23/18   [provider]  hydrocortisone 1 % ointment Apply small amount mix with lotion at night for leg rash 02/27/20   Lavera Guise, MD  ipratropium-albuterol (DUONEB) 0.5-2.5 (3) MG/3ML SOLN USE 3 ML VIA NEBULIZER EVERY 6 HOURS AS NEEDED 10/10/22   Jonetta Osgood, NP  loratadine (CLARITIN) 10 MG tablet Take 1 tablet (10 mg total) by mouth daily. 03/21/22   Jonetta Osgood, NP  losartan (COZAAR) 25 MG tablet Take 1 tablet (25 mg total) by mouth daily. 03/21/22   Jonetta Osgood, NP  Magnesium 250 MG TABS Take 250 mg by mouth 2 (two) times daily.    [provider]  montelukast (SINGULAIR) 10 MG tablet TAKE 1 TABLET BY MOUTH DAILY 08/25/22   Jonetta Osgood, NP  mupirocin ointment (BACTROBAN) 2 % Apply 1 Application topically 2 (two) times daily. To superficial scratches on legs until resolved. 09/19/22   Jonetta Osgood, NP  niacin (NIASPAN) 500 MG CR tablet niacin ER 500 mg tablet,extended release 24 hr 06/15/19   [provider]  senna (SENOKOT) 8.6 MG tablet Take 1 tablet (8.6 mg total) by mouth daily. 03/21/21   Jonetta Osgood, NP   simethicone (MYLICON) 80 MG chewable tablet Chew 2 tablets (160 mg total) by mouth 2 (two) times daily. 06/20/21   Jonetta Osgood, NP  Tiotropium Bromide-Olodaterol (STIOLTO RESPIMAT) 2.5-2.5 MCG/ACT AERS Inhale 1 Inhalation into the lungs daily. 08/14/22   Abernathy, Yetta Flock, NP  TOUJEO SOLOSTAR 300 UNIT/ML SOPN Inject 25 Units into the skin at bedtime.  11/23/18   [provider]  vitamin B-12 (CYANOCOBALAMIN) 1000 MCG tablet Take 1,000 mcg by mouth daily.    [provider]    Physical Exam: Vitals:   10/31/22 0235 10/31/22 0251 10/31/22 0300 10/31/22 0400  BP: (!) 121/52  (!) 97/52 114/68  Pulse: (!) 112  (!) 101 (!) 104  Resp: 18  15 15  $ Temp: (!) 101.4  F (38.6 C)     TempSrc: Oral     SpO2: 100%  97% 99%  Weight:  75.5 kg    Height:  5' 6"$  (1.676 m)     Physical Exam Vitals and nursing note reviewed.  Constitutional:      General: He is not in acute distress.    Appearance: He is ill-appearing and toxic-appearing.     Interventions: Nasal cannula in place.  HENT:     Head: Normocephalic and atraumatic.     Mouth/Throat:     Mouth: Mucous membranes are dry.  Cardiovascular:     Rate and Rhythm: Regular rhythm. Tachycardia present.     Heart sounds: Normal heart sounds.  Pulmonary:     Effort: Pulmonary effort is normal.     Breath sounds: Normal breath sounds.  Abdominal:     Palpations: Abdomen is soft.     Tenderness: There is no abdominal tenderness.  Neurological:     General: No focal deficit present.     Mental Status: He is easily aroused. He is lethargic and disoriented.     Labs on Admission: I have personally reviewed following labs and imaging studies  CBC: Recent Labs  Lab 10/31/22 0243  WBC 10.6*  NEUTROABS 9.1*  HGB 11.5*  HCT 35.6*  MCV 84.4  PLT 123XX123   Basic Metabolic Panel: Recent Labs  Lab 10/31/22 0243  NA 127*  K 5.3*  CL 96*  CO2 23  GLUCOSE 223*  BUN 23  CREATININE 2.01*  CALCIUM 7.9*  MG 2.0    GFR: Estimated Creatinine Clearance: 26 mL/min (A) (by C-G formula based on SCr of 2.01 mg/dL (H)). Liver Function Tests: Recent Labs  Lab 10/31/22 0243  AST 32  ALT 20  ALKPHOS 105  BILITOT 0.7  PROT 6.9  ALBUMIN 3.6   No results for input(s): "LIPASE", "AMYLASE" in the last 168 hours. No results for input(s): "AMMONIA" in the last 168 hours. Coagulation Profile: Recent Labs  Lab 10/31/22 0243  INR 1.3*   Cardiac Enzymes: No results for input(s): "CKTOTAL", "CKMB", "CKMBINDEX", "TROPONINI" in the last 168 hours. BNP (last 3 results) No results for input(s): "PROBNP" in the last 8760 hours. HbA1C: No results for input(s): "HGBA1C" in the last 72 hours. CBG: No results for input(s): "GLUCAP" in the last 168 hours. Lipid Profile: No results for input(s): "CHOL", "HDL", "LDLCALC", "TRIG", "CHOLHDL", "LDLDIRECT" in the last 72 hours. Thyroid Function Tests: No results for input(s): "TSH", "T4TOTAL", "FREET4", "T3FREE", "THYROIDAB" in the last 72 hours. Anemia Panel: No results for input(s): "VITAMINB12", "FOLATE", "FERRITIN", "TIBC", "IRON", "RETICCTPCT" in the last 72 hours. Urine analysis:    Component Value Date/Time   COLORURINE YELLOW (A) 10/31/2022 0243   APPEARANCEUR CLOUDY (A) 10/31/2022 0243   APPEARANCEUR Clear 03/21/2022 0915   LABSPEC 1.018 10/31/2022 0243   PHURINE 5.0 10/31/2022 0243   GLUCOSEU NEGATIVE 10/31/2022 0243   HGBUR MODERATE (A) 10/31/2022 0243   BILIRUBINUR NEGATIVE 10/31/2022 0243   BILIRUBINUR Negative 03/21/2022 0915   KETONESUR NEGATIVE 10/31/2022 0243   PROTEINUR 100 (A) 10/31/2022 0243   UROBILINOGEN 0.2 10/16/2020 1343   NITRITE NEGATIVE 10/31/2022 0243   LEUKOCYTESUR NEGATIVE 10/31/2022 0243    Radiological Exams on Admission: CT HEAD WO CONTRAST (5MM)  Result Date: 10/31/2022 CLINICAL DATA:  Mental status change, unknown cause EXAM: CT HEAD WITHOUT CONTRAST TECHNIQUE: Contiguous axial images were obtained from the base of the  skull through the vertex without intravenous contrast. RADIATION DOSE REDUCTION: This  exam was performed according to the departmental dose-optimization program which includes automated exposure control, adjustment of the mA and/or kV according to patient size and/or use of iterative reconstruction technique. COMPARISON:  02/07/2020 FINDINGS: Brain: No evidence of acute infarction, hemorrhage, mass, mass effect, or midline shift. No hydrocephalus or extra-axial fluid collection. Age related cerebral atrophy, with commensurate ventricular enlargement. Vascular: No hyperdense vessel. Skull: Negative for fracture or focal lesion. Sinuses/Orbits: No acute finding. Status post bilateral lens replacements. Other: The mastoid air cells are well aerated. IMPRESSION: No acute intracranial process. Electronically Signed   By: Merilyn Baba M.D.   On: 10/31/2022 03:38   DG Chest Port 1 View  Result Date: 10/31/2022 CLINICAL DATA:  Sepsis EXAM: PORTABLE CHEST 1 VIEW COMPARISON:  10/13/2022 FINDINGS: Lungs are clear. No pneumothorax or pleural effusion. Coronary artery bypass grafting has been performed. Cardiac size within normal limits. Pulmonary vascularity is normal. No acute bone abnormality. IMPRESSION: 1. No active disease. Electronically Signed   By: Fidela Salisbury M.D.   On: 10/31/2022 03:37     Data Reviewed: Relevant notes from primary care and specialist visits, past discharge summaries as available in EHR, including Care Everywhere. Prior diagnostic testing as pertinent to current admission diagnoses Updated medications and problem lists for reconciliation ED course, including vitals, labs, imaging, treatment and response to treatment Triage notes, nursing and pharmacy notes and ED provider's notes Notable results as noted in HPI   Assessment and Plan: * Severe sepsis ([place]) COVID infection Severe sepsis criteria include fever, tachycardia, leukocytosis, source of infection likely COVID, AKI, acute  metabolic acidosis and acute respiratory failure S/p COVID fluid bolus in the ED Continue sepsis fluids Patient got metronidazole vancomycin and aztreonam in the ED Will get procalcitonin to evaluate for possibility of bacterial infection Antibiotics not continued at this time due to suspicion for COVID being the main etiology  Acute respiratory failure due to COVID-19 ([place]) O2 sat 84% with EMS however normal on room air. In the setting of COVID will continue supplemental oxygen Solu-Medrol Unknown onset of symptoms so will hold off on antiviral.  Consider ID consult for initiation of anti-    Acute metabolic encephalopathy Patient somnolent, altered Will keep n.p.o. until more alert to decrease aspiration risk Neurologic checks and fall and aspiration precautions  AKI (acute kidney injury) ([place]) Creatinine 2.01 up from 1.12 Expecting improvement with IV fluid resuscitation  Frequent PVCs Multiple PVCs on EKG Keep mag over 2 and potassium over 4  Uncontrolled type 2 diabetes mellitus with hyperglycemia, with long-term current use of insulin (HCC) Blood sugar 223 Sliding scale insulin coverage  Hyponatremia Possibly pseudohyponatremia related to elevated blood glucose Expecting improvement with correction of sepsis  Chronic bronchitis, obstructive Continue home inhaler Albuterol as needed  OSA on CPAP CPAP nightly if desired  Essential hypertension Hold antihypertensives due to soft blood pressures related to severe sepsis  Coronary artery disease due to lipid rich plaque Elevated troponin Troponin 48.  EKG with no ischemic changes.  Suspecting demand ischemia  continue aspirin, atorvastatin if once safe to swallow        DVT prophylaxis: Lovenox  Consults: none  Advance Care Planning:   Code Status: Prior   Family Communication: none  Disposition Plan: Back to previous home environment  Severity of Illness: The appropriate patient status for this  patient is INPATIENT. Inpatient status is judged to be reasonable and necessary in order to provide the required intensity of service to ensure the patient's safety. The  patient's presenting symptoms, physical exam findings, and initial radiographic and laboratory data in the context of their chronic comorbidities is felt to place them at high risk for further clinical deterioration. Furthermore, it is not anticipated that the patient will be medically stable for discharge from the hospital within 2 midnights of admission.   * I certify that at the point of admission it is my clinical judgment that the patient will require inpatient hospital care spanning beyond 2 midnights from the point of admission due to high intensity of service, high risk for further deterioration and high frequency of surveillance required.*  Author: Athena Masse, MD 10/31/2022 4:53 AM  For on call review [email or web address].

## 2022-10-31 NOTE — Telephone Encounter (Signed)
Just FYI.

## 2022-10-31 NOTE — Assessment & Plan Note (Signed)
Continue home inhaler Albuterol as needed

## 2022-10-31 NOTE — ED Provider Notes (Signed)
[facility] Provider Note    Event Date/Time   First MD Initiated Contact with Patient 10/31/22 0225     (approximate)   History   Altered Mental Status and Fever   HPI  Daniel Hodges is a 82 y.o. male with history of hypertension, hyperlipidemia, diabetes, CAD who presents emergency department with EMS for concerns for altered mental status.  Wife reportedly found patient lying back on the bed with altered mental status and called 911.  EMS states axillary temperature of 102, 84% on room air, slightly tachycardic with systolic blood pressures in the 90s.  He does not wear oxygen chronically.  They gave IV fluids and blood pressure improved.  He is able to tell me his name but otherwise states yes and no to questions.  Seems confused here.  Denies any pain.   History provided by patient, EMS.    Past Medical History:  Diagnosis Date   Anginal pain ([place])    Asthma    Coronary artery disease    Diabetes mellitus without complication ([place])    Hyperlipidemia    Hypertension    Sleep apnea     Past Surgical History:  Procedure Laterality Date   APPENDECTOMY     COLONOSCOPY WITH PROPOFOL N/A 06/11/2015   Procedure: COLONOSCOPY WITH PROPOFOL;  Surgeon: Manya Silvas, MD;  Location: [place];  Service: Endoscopy;  Laterality: N/A;   CORONARY ARTERY BYPASS GRAFT     HERNIA REPAIR     TEE WITHOUT CARDIOVERSION     TRACHEOSTOMY     VASCULAR SURGERY      MEDICATIONS:  Prior to Admission medications   Medication Sig Start Date End Date Taking? Authorizing Provider  albuterol (VENTOLIN HFA) 108 (90 Base) MCG/ACT inhaler Inhale 2 puffs into the lungs every 6 (six) hours as needed for wheezing or shortness of breath. 03/21/22   Jonetta Osgood, NP  amLODipine (NORVASC) 2.5 MG tablet Take 1 tablet (2.5 mg total) by mouth daily. 10/10/22   Jonetta Osgood, NP  aspirin 81 MG tablet Take 81 mg by mouth daily.    [provider]   atorvastatin (LIPITOR) 20 MG tablet TAKE 1 TABLET BY MOUTH AT  BEDTIME 03/13/22   Jonetta Osgood, NP  benzonatate (TESSALON) 200 MG capsule Take 1 capsule (200 mg total) by mouth 2 (two) times daily as needed for cough. 07/16/22   Jonetta Osgood, NP  chlorpheniramine-HYDROcodone (TUSSIONEX) 10-8 MG/5ML Take 5 mLs by mouth every 12 (twelve) hours as needed for cough. 08/14/22   Jonetta Osgood, NP  cholecalciferol (VITAMIN D3) 25 MCG (1000 UT) tablet Take 1,000 Units by mouth daily.    [provider]  Continuous Blood Gluc Receiver (FREESTYLE LIBRE 2 READER) DEVI USE AS DIRECTED 12/09/21   Jonetta Osgood, NP  Continuous Blood Gluc Sensor (FREESTYLE LIBRE 2 SENSOR) MISC 1 Device by Does not apply route every 14 (fourteen) days. 09/16/22   Jonetta Osgood, NP  feeding supplement, GLUCERNA SHAKE, ([place]) LIQD One a day 10/12/20   McDonough, Lauren K, PA-C  ferrous sulfate 324 MG TBEC Take by mouth. 10/21/13   [provider]  furosemide (LASIX) 20 MG tablet Take 1 tablet (20 mg total) by mouth daily. 03/21/22   Jonetta Osgood, NP  gabapentin (NEURONTIN) 300 MG capsule TAKE 1 CAPSULE BY MOUTH  DAILY AND 1 CAPSULE BY  MOUTH AT NIGHT 10/10/22   Abernathy, Alyssa, NP  glucose blood test strip 1 each by Other route in the morning,  at noon, in the evening, and at bedtime. Use as instructed 06/20/21   Jonetta Osgood, NP  HUMALOG KWIKPEN 100 UNIT/ML KwikPen Inject 6-10 Units into the skin See admin instructions. Inject 6u under the skin daily at breakfast-time, 8u at lunch-time and inject 10u under the skin daily at dinner-time 11/23/18   [provider]  hydrocortisone 1 % ointment Apply small amount mix with lotion at night for leg rash 02/27/20   Lavera Guise, MD  ipratropium-albuterol (DUONEB) 0.5-2.5 (3) MG/3ML SOLN USE 3 ML VIA NEBULIZER EVERY 6 HOURS AS NEEDED 10/10/22   Jonetta Osgood, NP  loratadine (CLARITIN) 10 MG tablet Take 1 tablet (10 mg total) by mouth  daily. 03/21/22   Jonetta Osgood, NP  losartan (COZAAR) 25 MG tablet Take 1 tablet (25 mg total) by mouth daily. 03/21/22   Jonetta Osgood, NP  Magnesium 250 MG TABS Take 250 mg by mouth 2 (two) times daily.    [provider]  montelukast (SINGULAIR) 10 MG tablet TAKE 1 TABLET BY MOUTH DAILY 08/25/22   Jonetta Osgood, NP  mupirocin ointment (BACTROBAN) 2 % Apply 1 Application topically 2 (two) times daily. To superficial scratches on legs until resolved. 09/19/22   Jonetta Osgood, NP  niacin (NIASPAN) 500 MG CR tablet niacin ER 500 mg tablet,extended release 24 hr 06/15/19   [provider]  senna (SENOKOT) 8.6 MG tablet Take 1 tablet (8.6 mg total) by mouth daily. 03/21/21   Jonetta Osgood, NP  simethicone (MYLICON) 80 MG chewable tablet Chew 2 tablets (160 mg total) by mouth 2 (two) times daily. 06/20/21   Jonetta Osgood, NP  Tiotropium Bromide-Olodaterol (STIOLTO RESPIMAT) 2.5-2.5 MCG/ACT AERS Inhale 1 Inhalation into the lungs daily. 08/14/22   Abernathy, Yetta Flock, NP  TOUJEO SOLOSTAR 300 UNIT/ML SOPN Inject 25 Units into the skin at bedtime.  11/23/18   [provider]  vitamin B-12 (CYANOCOBALAMIN) 1000 MCG tablet Take 1,000 mcg by mouth daily.    [provider]    Physical Exam   Triage Vital Signs: ED Triage Vitals [10/31/22 0235]  Enc Vitals Group     BP (!) 121/52     Pulse Rate (!) 112     Resp 18     Temp (!) 101.4 F (38.6 C)     Temp Source Oral     SpO2 100 %     Weight      Height      Head Circumference      Peak Flow      Pain Score      Pain Loc      Pain Edu?      Excl. in [place]?     Most recent vital signs: Vitals:   10/31/22 0300 10/31/22 0400  BP: (!) 97/52 114/68  Pulse: (!) 101 (!) 104  Resp: 15 15  Temp:    SpO2: 97% 99%    CONSTITUTIONAL: Alert, will answer his name but appears confused other questions and just states yes or no.  Febrile here.  Nontoxic in appearance. HEAD: Normocephalic,  atraumatic EYES: Conjunctivae clear, pupils appear equal, sclera nonicteric ENT: normal nose; moist mucous membranes NECK: Supple, normal ROM CARD: Regular and tachycardic; S1 and S2 appreciated RESP: Normal chest excursion without splinting or tachypnea; breath sounds clear and equal bilaterally; no wheezes, no rhonchi, no rales, no hypoxia or respiratory distress, speaking full sentences ABD/GI: Non-distended; soft, non-tender, no rebound, no guarding, no peritoneal signs BACK: The back appears normal EXT: Normal ROM  in all joints; no deformity noted, no edema SKIN: Normal color for age and race; warm; no rash on exposed skin NEURO: Moves all extremities equally, normal speech PSYCH: The patient's mood and manner are appropriate.   ED Results / Procedures / Treatments   LABS: (all labs ordered are listed, but only abnormal results are displayed) Labs Reviewed  RESP PANEL BY RT-PCR (RSV, FLU A&B, COVID)  RVPGX2 - Abnormal; Notable for the following components:      Result Value   SARS Coronavirus 2 by RT PCR POSITIVE (*)    All other components within normal limits  COMPREHENSIVE METABOLIC PANEL - Abnormal; Notable for the following components:   Sodium 127 (*)    Potassium 5.3 (*)    Chloride 96 (*)    Glucose, Bld 223 (*)    Creatinine, Ser 2.01 (*)    Calcium 7.9 (*)    GFR, Estimated 33 (*)    All other components within normal limits  CBC WITH DIFFERENTIAL/PLATELET - Abnormal; Notable for the following components:   WBC 10.6 (*)    Hemoglobin 11.5 (*)    HCT 35.6 (*)    Neutro Abs 9.1 (*)    Lymphs Abs 0.2 (*)    Monocytes Absolute 1.2 (*)    Abs Immature Granulocytes 0.08 (*)    All other components within normal limits  PROTIME-INR - Abnormal; Notable for the following components:   Prothrombin Time 16.4 (*)    INR 1.3 (*)    All other components within normal limits  URINALYSIS, COMPLETE (UACMP) WITH MICROSCOPIC - Abnormal; Notable for the following components:    Color, Urine YELLOW (*)    APPearance CLOUDY (*)    Hgb urine dipstick MODERATE (*)    Protein, ur 100 (*)    Bacteria, UA FEW (*)    All other components within normal limits  TROPONIN I (HIGH SENSITIVITY) - Abnormal; Notable for the following components:   Troponin I (High Sensitivity) 48 (*)    All other components within normal limits  CULTURE, BLOOD (ROUTINE X 2)  CULTURE, BLOOD (ROUTINE X 2)  URINE CULTURE  LACTIC ACID, PLASMA  APTT  PROCALCITONIN  MAGNESIUM  TROPONIN I (HIGH SENSITIVITY)     EKG:  EKG Interpretation  Date/Time:  Friday October 31 2022 02:26:54 EST Ventricular Rate:  111 PR Interval:  164 QRS Duration: 156 QT Interval:  357 QTC Calculation: 486 R Axis:   253 Text Interpretation: Sinus tachycardia Multiple ventricular premature complexes Probable left atrial enlargement RBBB and LAFB Artifact No significant change since last tracing Confirmed by Pryor Curia [phone]) on 10/31/2022 2:31:12 AM         RADIOLOGY: My personal review and interpretation of imaging: CT head unremarkable.  Chest x-ray clear.  I have personally reviewed all radiology reports.   CT HEAD WO CONTRAST (5MM)  Result Date: 10/31/2022 CLINICAL DATA:  Mental status change, unknown cause EXAM: CT HEAD WITHOUT CONTRAST TECHNIQUE: Contiguous axial images were obtained from the base of the skull through the vertex without intravenous contrast. RADIATION DOSE REDUCTION: This exam was performed according to the departmental dose-optimization program which includes automated exposure control, adjustment of the mA and/or kV according to patient size and/or use of iterative reconstruction technique. COMPARISON:  02/07/2020 FINDINGS: Brain: No evidence of acute infarction, hemorrhage, mass, mass effect, or midline shift. No hydrocephalus or extra-axial fluid collection. Age related cerebral atrophy, with commensurate ventricular enlargement. Vascular: No hyperdense vessel. Skull: Negative for  fracture or focal  lesion. Sinuses/Orbits: No acute finding. Status post bilateral lens replacements. Other: The mastoid air cells are well aerated. IMPRESSION: No acute intracranial process. Electronically Signed   By: Merilyn Baba M.D.   On: 10/31/2022 03:38   DG Chest Port 1 View  Result Date: 10/31/2022 CLINICAL DATA:  Sepsis EXAM: PORTABLE CHEST 1 VIEW COMPARISON:  10/13/2022 FINDINGS: Lungs are clear. No pneumothorax or pleural effusion. Coronary artery bypass grafting has been performed. Cardiac size within normal limits. Pulmonary vascularity is normal. No acute bone abnormality. IMPRESSION: 1. No active disease. Electronically Signed   By: Fidela Salisbury M.D.   On: 10/31/2022 03:37     PROCEDURES:  Critical Care performed: Yes, see critical care procedure note(s)   CRITICAL CARE Performed by: Cyril Mourning Lewi Drost   Total critical care time: 45 minutes  Critical care time was exclusive of separately billable procedures and treating other patients.  Critical care was necessary to treat or prevent imminent or life-threatening deterioration.  Critical care was time spent personally by me on the following activities: development of treatment plan with patient and/or surrogate as well as nursing, discussions with consultants, evaluation of patient's response to treatment, examination of patient, obtaining history from patient or surrogate, ordering and performing treatments and interventions, ordering and review of laboratory studies, ordering and review of radiographic studies, pulse oximetry and re-evaluation of patient's condition.   Marland Kitchen1-3 Lead EKG Interpretation  Performed by: Marieanne Marxen, Delice Bison, DO Authorized by: Cerise Lieber, Delice Bison, DO     Interpretation: abnormal     ECG rate:  112   ECG rate assessment: tachycardic     Rhythm: sinus tachycardia     Ectopy: none     Conduction: normal       IMPRESSION / MDM / ASSESSMENT AND PLAN / ED COURSE  I reviewed the triage vital signs and the  nursing notes.    Patient here with altered mental status, fever, tachycardia, hypotension with EMS and has resolved, hypoxia.  The patient is on the cardiac monitor to evaluate for evidence of arrhythmia and/or significant heart rate changes.   DIFFERENTIAL DIAGNOSIS (includes but not limited to):   Sepsis, pneumonia, bacteremia, meningitis, UTI, intracranial hemorrhage, stroke, CHF, pneumothorax, ACS   Patient's presentation is most consistent with acute presentation with potential threat to life or bodily function.   PLAN: Will obtain septic workup with labs, urine, cultures.  Will give IV fluids and broad-spectrum antibiotics.  Will give Tylenol for fever.  Will obtain COVID and flu swab.   MEDICATIONS GIVEN IN ED: Medications  lactated ringers infusion (has no administration in time range)  aztreonam (AZACTAM) 2 g in sodium chloride 0.9 % 100 mL IVPB (has no administration in time range)  vancomycin (VANCOREADY) IVPB 1750 mg/350 mL (1,750 mg Intravenous New Bag/Given 10/31/22 0308)  lactated ringers bolus 1,000 mL (0 mLs Intravenous Stopped 10/31/22 0405)  metroNIDAZOLE (FLAGYL) IVPB 500 mg (0 mg Intravenous Stopped 10/31/22 0405)  acetaminophen (TYLENOL) tablet 1,000 mg (1,000 mg Oral Given 10/31/22 0304)  lactated ringers bolus 1,000 mL (1,000 mLs Intravenous New Bag/Given 10/31/22 0341)  lactated ringers bolus 500 mL (500 mLs Intravenous New Bag/Given 10/31/22 0341)     ED COURSE: Patient has had some soft blood pressures in the 0000000 systolic.  Given 30 mL/kg IV fluid bolus and has been fluid responsive.  Hypoxic with EMS but doing well on room air here.  Patient is positive for COVID-19 likely the cause of his sepsis and encephalopathy.  Lactic is normal at  1.7.  Procalcitonin minimally elevated at 0.12.  Urine shows no sign of infection.  Troponin minimally elevated at 48 likely from demand ischemia.  Chest x-ray reviewed and interpreted by myself and the radiologist and shows no  acute abnormality.  CT head also appears normal.  Labs do show an AKI compared to previous in July 2023.  He is getting 30 mL/kg IV fluid bolus.  Will discuss with hospitalist for admission.   CONSULTS:  Consulted and discussed patient's case with hospitalist, Dr. Damita Dunnings.  I have recommended admission and consulting physician agrees and will place admission orders.  Patient (and family if present) agree with this plan.   I reviewed all nursing notes, vitals, pertinent previous records.  All labs, EKGs, imaging ordered have been independently reviewed and interpreted by myself.    OUTSIDE RECORDS REVIEWED: Reviewed last cardiology note on 07/08/2022.       FINAL CLINICAL IMPRESSION(S) / ED DIAGNOSES   Final diagnoses:  Acute sepsis ([address])  COVID-19  AKI (acute kidney injury) ([place])  Altered mental status, unspecified altered mental status type     Rx / DC Orders   ED Discharge Orders     None        Note:  This document was prepared using Dragon voice recognition software and may include unintentional dictation errors.   Henrietta Cieslewicz, Delice Bison, DO 10/31/22 [phone]

## 2022-10-31 NOTE — ED Notes (Signed)
Notified attending Ayiku B. that pt A&Ox4 and would like diet order placed. Awaiting reply/order.

## 2022-10-31 NOTE — Progress Notes (Signed)
Interval events noted.  No shortness of breath, cough or chest pain.  He was asking whether he could go home today.  Vital signs are stable.  He is tolerating room air.  He has been admitted for severe sepsis secondary to [id number] infection complicated by acute hypoxic respiratory failure, AKI, hyperkalemia and metabolic encephalopathy.  Continue IV fluids and supportive care.

## 2022-10-31 NOTE — ED Notes (Signed)
Pt's talking with his wife on phone; wife plans to visit with son later; [employer] consultant confirms they're okay to visit with pt on covid precautions; pt eating from his lunch tray.

## 2022-10-31 NOTE — ED Notes (Signed)
Pt transferred to room via stretcher. Stable at time of departure

## 2022-10-31 NOTE — Progress Notes (Signed)
Anticoagulation monitoring(Lovenox):  82 yo male ordered Lovenox 40 mg Q24h    Filed Weights   10/31/22 0251  Weight: 75.5 kg (166 lb 7.2 oz)   BMI 26.8   Lab Results  Component Value Date   CREATININE 2.01 (H) 10/31/2022   CREATININE 1.12 03/26/2022   CREATININE 1.12 04/24/2020   Estimated Creatinine Clearance: 26 mL/min (A) (by C-G formula based on SCr of 2.01 mg/dL (H)). Hemoglobin & Hematocrit     Component Value Date/Time   HGB 11.5 (L) 10/31/2022 0243   HGB 13.1 03/26/2022 0816   HCT 35.6 (L) 10/31/2022 0243   HCT 41.1 03/26/2022 0816     Per Protocol for Patient with estCrcl < 30 ml/min and BMI < 30, will transition to Lovenox 30 mg Q24h.

## 2022-10-31 NOTE — Assessment & Plan Note (Signed)
O2 sat 84% with EMS however normal on room air. In the setting of COVID will continue supplemental oxygen Solu-Medrol Unknown onset of symptoms so will hold off on antiviral.  Consider ID consult for initiation of anti-

## 2022-10-31 NOTE — ED Notes (Signed)
Pt ambulated to toilet and back with 1 assist. RN and myself repositioned pt in bed.

## 2022-10-31 NOTE — ED Notes (Signed)
Informed RN that the room is not clean.

## 2022-10-31 NOTE — ED Notes (Signed)
Pt assisted up to bedside toilet as requested.

## 2022-10-31 NOTE — Progress Notes (Signed)
CODE SEPSIS - PHARMACY COMMUNICATION  **Broad Spectrum Antibiotics should be administered within 1 hour of Sepsis diagnosis**  Time Code Sepsis Called/Page Received: 2/16 @ 0227   Antibiotics Ordered: metronidazole, vancomycin, aztreonam  Time of 1st antibiotic administration: metronidazole 500 mg IV X 1 on 2/16 @ 0302  Additional action taken by pharmacy:   If necessary, Name of Provider/Nurse Contacted:     Alli Jasmer D ,PharmD Clinical Pharmacist  10/31/2022  5:52 AM

## 2022-10-31 NOTE — ED Notes (Addendum)
Assisted patient to restroom. Pt ambulatory without distress

## 2022-10-31 NOTE — ED Notes (Signed)
Pt has wet cough but non-productive.

## 2022-10-31 NOTE — ED Notes (Signed)
Pt's wife called this RN back and was briefly updated.

## 2022-10-31 NOTE — Assessment & Plan Note (Signed)
CPAP nightly if desired °

## 2022-10-31 NOTE — ED Notes (Signed)
Attempted to update pt's wife as she called Network engineer requesting an update; [phone]. No answer.

## 2022-10-31 NOTE — Sepsis Progress Note (Signed)
Elink monitoring for the code sepsis protocol.  

## 2022-10-31 NOTE — ED Notes (Signed)
Informed RN room is dirty.

## 2022-10-31 NOTE — Assessment & Plan Note (Addendum)
Elevated troponin Troponin 48.  EKG with no ischemic changes.  Suspecting demand ischemia  continue aspirin, atorvastatin if once safe to swallow

## 2022-10-31 NOTE — ED Notes (Signed)
TV adjusted for pt; pt repositioned on stretcher.

## 2022-10-31 NOTE — Progress Notes (Signed)
PHARMACY -  BRIEF ANTIBIOTIC NOTE   Pharmacy has received consult(s) for Vanc, Aztreonam from an ED provider.  The patient's profile has been reviewed for ht/wt/allergies/indication/available labs.    One time order(s) placed for Vancomycin 1750 mg IV X 1 and Aztreonam 2 gm IV X 1  Further antibiotics/pharmacy consults should be ordered by admitting physician if indicated.                       Thank you, Larenzo Caples D 10/31/2022  2:58 AM

## 2022-10-31 NOTE — Assessment & Plan Note (Signed)
Possibly pseudohyponatremia related to elevated blood glucose Expecting improvement with correction of sepsis

## 2022-10-31 NOTE — ED Notes (Signed)
Pt had BM and provided self peri care, pt assisted back to bed; SOB noted upon exertion.

## 2022-10-31 NOTE — Assessment & Plan Note (Signed)
Blood sugar 223 Sliding scale insulin coverage

## 2022-10-31 NOTE — ED Notes (Signed)
RN to bedside to introduce self to pt Pt resting comfortably.

## 2022-10-31 NOTE — ED Notes (Addendum)
Pt states cannot urinate using external cath so removed at pt's request; non-slip socks applied to pt's feet; pt sitting on edge of stretcher attempting to use urinal at his request; pt's received food tray and drink.

## 2022-10-31 NOTE — Assessment & Plan Note (Signed)
Hold antihypertensives due to soft blood pressures related to severe sepsis

## 2022-10-31 NOTE — Assessment & Plan Note (Signed)
Creatinine 2.01 up from 1.12 Expecting improvement with IV fluid resuscitation

## 2022-10-31 NOTE — Assessment & Plan Note (Addendum)
COVID infection Severe sepsis criteria include fever, tachycardia, leukocytosis, source of infection likely COVID, AKI, acute metabolic acidosis and acute respiratory failure S/p COVID fluid bolus in the ED Continue sepsis fluids Patient got metronidazole vancomycin and aztreonam in the ED Will get procalcitonin to evaluate for possibility of bacterial infection Antibiotics not continued at this time due to suspicion for COVID being the main etiology

## 2022-10-31 NOTE — Assessment & Plan Note (Signed)
Patient somnolent, altered Will keep n.p.o. until more alert to decrease aspiration risk Neurologic checks and fall and aspiration precautions

## 2022-10-31 NOTE — ED Notes (Signed)
Pt gave verbal permission to me for Metta Clines RN to return a call to his wife for an update on his condition

## 2022-11-01 DIAGNOSIS — J9600 Acute respiratory failure, unspecified whether with hypoxia or hypercapnia: Secondary | ICD-10-CM | POA: Diagnosis not present

## 2022-11-01 DIAGNOSIS — U071 COVID-19: Secondary | ICD-10-CM

## 2022-11-01 DIAGNOSIS — E871 Hypo-osmolality and hyponatremia: Secondary | ICD-10-CM

## 2022-11-01 DIAGNOSIS — N179 Acute kidney failure, unspecified: Secondary | ICD-10-CM

## 2022-11-01 DIAGNOSIS — R6520 Severe sepsis without septic shock: Secondary | ICD-10-CM | POA: Diagnosis not present

## 2022-11-01 DIAGNOSIS — A419 Sepsis, unspecified organism: Secondary | ICD-10-CM | POA: Diagnosis not present

## 2022-11-01 LAB — GLUCOSE, CAPILLARY
Glucose-Capillary: 105 mg/dL — ABNORMAL HIGH (ref 70–99)
Glucose-Capillary: 105 mg/dL — ABNORMAL HIGH (ref 70–99)
Glucose-Capillary: 120 mg/dL — ABNORMAL HIGH (ref 70–99)
Glucose-Capillary: 135 mg/dL — ABNORMAL HIGH (ref 70–99)
Glucose-Capillary: 179 mg/dL — ABNORMAL HIGH (ref 70–99)
Glucose-Capillary: 333 mg/dL — ABNORMAL HIGH (ref 70–99)
Glucose-Capillary: 345 mg/dL — ABNORMAL HIGH (ref 70–99)

## 2022-11-01 LAB — BASIC METABOLIC PANEL
Anion gap: 8 (ref 5–15)
BUN: 24 mg/dL — ABNORMAL HIGH (ref 8–23)
CO2: 22 mmol/L (ref 22–32)
Calcium: 8.4 mg/dL — ABNORMAL LOW (ref 8.9–10.3)
Chloride: 101 mmol/L (ref 98–111)
Creatinine, Ser: 1.03 mg/dL (ref 0.61–1.24)
GFR, Estimated: >60 mL/min (ref >60)
Glucose, Bld: 126 mg/dL — ABNORMAL HIGH (ref 70–99)
Potassium: 4.5 mmol/L (ref 3.5–5.1)
Sodium: 131 mmol/L — ABNORMAL LOW (ref 135–145)

## 2022-11-01 LAB — PROTIME-INR
INR: 1.2 (ref 0.8–1.2)
Prothrombin Time: 15.1 seconds (ref 11.4–15.2)

## 2022-11-01 LAB — CORTISOL-AM, BLOOD: Cortisol - AM: 4.3 ug/dL — ABNORMAL LOW (ref 6.7–22.6)

## 2022-11-01 LAB — URINE CULTURE: Culture: NO GROWTH

## 2022-11-01 LAB — PROCALCITONIN: Procalcitonin: <0.1 ng/mL

## 2022-11-01 MED ORDER — UMECLIDINIUM BROMIDE 62.5 MCG/ACT IN AEPB: 1.0000 | INHALATION_SPRAY | Freq: Every day | RESPIRATORY_TRACT | Status: DC

## 2022-11-01 MED ORDER — ATORVASTATIN CALCIUM 20 MG PO TABS
20.0000 mg | ORAL_TABLET | Freq: Every day | ORAL | Status: DC
  Administered 2022-11-01: 20 mg via ORAL
  Filled 2022-11-01: qty 1

## 2022-11-01 MED ORDER — ALBUTEROL SULFATE HFA 108 (90 BASE) MCG/ACT IN AERS: 2.0000 | INHALATION_SPRAY | Freq: Four times a day (QID) | RESPIRATORY_TRACT | Status: DC | PRN

## 2022-11-01 MED ORDER — LOSARTAN POTASSIUM 25 MG PO TABS
25.0000 mg | ORAL_TABLET | Freq: Every day | ORAL | Status: DC
  Administered 2022-11-02: 25 mg via ORAL
  Filled 2022-11-01 (×2): qty 1

## 2022-11-01 MED ORDER — ASPIRIN 81 MG PO CHEW
81.0000 mg | CHEWABLE_TABLET | Freq: Every day | ORAL | Status: DC
  Administered 2022-11-01 – 2022-11-02 (×2): 81 mg via ORAL
  Filled 2022-11-01 (×2): qty 1

## 2022-11-01 MED ORDER — IPRATROPIUM-ALBUTEROL 0.5-2.5 (3) MG/3ML IN SOLN: 3.0000 mL | Freq: Four times a day (QID) | RESPIRATORY_TRACT | Status: DC | PRN

## 2022-11-01 MED ORDER — NIACIN ER (ANTIHYPERLIPIDEMIC) 500 MG PO TBCR
500.0000 mg | EXTENDED_RELEASE_TABLET | Freq: Every day | ORAL | Status: DC
  Administered 2022-11-01: 500 mg via ORAL
  Filled 2022-11-01: qty 1

## 2022-11-01 MED ORDER — GABAPENTIN 300 MG PO CAPS
300.0000 mg | ORAL_CAPSULE | Freq: Two times a day (BID) | ORAL | Status: DC
  Administered 2022-11-01 – 2022-11-02 (×2): 300 mg via ORAL
  Filled 2022-11-01 (×2): qty 1

## 2022-11-01 MED ORDER — MONTELUKAST SODIUM 10 MG PO TABS
10.0000 mg | ORAL_TABLET | Freq: Every day | ORAL | Status: DC
  Administered 2022-11-01 – 2022-11-02 (×2): 10 mg via ORAL
  Filled 2022-11-01 (×2): qty 1

## 2022-11-01 MED ORDER — MAGNESIUM OXIDE -MG SUPPLEMENT 400 (240 MG) MG PO TABS
200.0000 mg | ORAL_TABLET | Freq: Two times a day (BID) | ORAL | Status: DC
  Administered 2022-11-01 – 2022-11-02 (×2): 200 mg via ORAL
  Filled 2022-11-01 (×2): qty 1

## 2022-11-01 MED ORDER — FERROUS SULFATE 325 (65 FE) MG PO TABS
324.0000 mg | ORAL_TABLET | Freq: Every day | ORAL | Status: DC
  Administered 2022-11-02: 324 mg via ORAL
  Filled 2022-11-01: qty 1

## 2022-11-01 MED ORDER — SENNA 8.6 MG PO TABS
1.0000 | ORAL_TABLET | Freq: Every day | ORAL | Status: DC
  Administered 2022-11-02: 8.6 mg via ORAL
  Filled 2022-11-01: qty 1

## 2022-11-01 MED ORDER — ARFORMOTEROL TARTRATE 15 MCG/2ML IN NEBU
15.0000 ug | INHALATION_SOLUTION | Freq: Two times a day (BID) | RESPIRATORY_TRACT | Status: DC
  Administered 2022-11-01 – 2022-11-02 (×2): 15 ug via RESPIRATORY_TRACT
  Filled 2022-11-01: qty 2

## 2022-11-01 MED ORDER — AMLODIPINE BESYLATE 5 MG PO TABS: 2.5000 mg | ORAL_TABLET | Freq: Every day | ORAL | Status: DC

## 2022-11-01 MED ORDER — INSULIN GLARGINE 100 UNIT/ML ~~LOC~~ SOLN
25.0000 [IU] | Freq: Every day | SUBCUTANEOUS | Status: DC
  Filled 2022-11-01: qty 0.25

## 2022-11-01 MED ORDER — FUROSEMIDE 20 MG PO TABS
20.0000 mg | ORAL_TABLET | Freq: Every day | ORAL | Status: DC
  Administered 2022-11-01 – 2022-11-02 (×2): 20 mg via ORAL
  Filled 2022-11-01 (×2): qty 1

## 2022-11-01 MED ORDER — VITAMIN D 25 MCG (1000 UNIT) PO TABS
1000.0000 [IU] | ORAL_TABLET | Freq: Every day | ORAL | Status: DC
  Administered 2022-11-01 – 2022-11-02 (×2): 1000 [IU] via ORAL
  Filled 2022-11-01 (×2): qty 1

## 2022-11-01 MED ORDER — INSULIN GLARGINE-YFGN 100 UNIT/ML ~~LOC~~ SOLN
25.0000 [IU] | Freq: Every day | SUBCUTANEOUS | Status: DC
  Administered 2022-11-01: 25 [IU] via SUBCUTANEOUS
  Filled 2022-11-01: qty 0.25

## 2022-11-01 NOTE — Care Management Obs Status (Signed)
[place] NOTIFICATION   Patient Details  Name: Daniel Hodges MRN: [id number] Date of Birth: 1940/11/08   Medicare Observation Status Notification Given:  Yes    Tiburcio Bash, LCSW 11/01/2022, 3:38 PM

## 2022-11-01 NOTE — Discharge Summary (Addendum)
Physician Discharge Summary   Patient: Daniel Hodges MRN: [id number] DOB: 07/26/1941  Admit date:     10/31/2022  Discharge date: 11/01/22  Discharge Physician: Jennye Boroughs   PCP: Lavera Guise, MD   Recommendations at discharge:   Follow-up with PCP in 1 week    Discharge Diagnoses: Principal Problem:   Severe sepsis ([place]) Active Problems:   Acute respiratory failure due to COVID-19 ([place])   AKI (acute kidney injury) ([place])   Acute metabolic encephalopathy   Uncontrolled type 2 diabetes mellitus with hyperglycemia, with long-term current use of insulin ([facility])   Frequent PVCs   Hyponatremia   Coronary artery disease due to lipid rich plaque   Essential hypertension   OSA on CPAP   Chronic bronchitis, obstructive   Elevated troponin   Hyperkalemia  Resolved Problems:   * No resolved hospital problems. [facility] Course:  [name] is a 82 y.o. male with medical history significant for For CAD, COPD, OSA on CPAP, HTN, insulin-dependent type 2 diabetes, who was brought to the hospital because of fever and change in mental status.  Reportedly, oxygen saturation was 84% on room air temperature 102 F.   He tested positive for COVID-19 infection.  He was admitted to the hospital for severe sepsis secondary to [id number] infection complicated by acute hypoxic respiratory failure, acute metabolic encephalopathy and AKI.  He was treated with IV fluids and steroids.  His condition improved significantly.  AKI and hypoxic respiratory failure have resolved.  He was able to ambulate on room air without oxygen desaturation.  Hyponatremia has improved with IV fluids.  Mental status has improved.  Patient was noted to be forgetful at times.  His daughter said that they were not sure whether recurrent episodes of hypoglycemia in the past that affected his cognition/memory.  She also thought that acute illness from COVID infection may have caused worsening confusion.   Of note,  when I saw the patient on 10/31/2022, he said he felt better and wanted to be discharged.  He said he was worried about the hospital bill.  I explained to him that he will have to be monitored at least overnight to make sure that he was actually doing well before discharge.  I advised him to focus on his health and getting better and not on the medical bill. He feels better and he is deemed stable for discharge to home today. Discharge plan was discussed with Daniel Hodges, daughter, over the phone  I was advised by Daniel Hodges, with relates that she reviewed to change patient from inpatient to observation rejected.  However, patient, his wife and daughter were upset that her status had not changed from inpatient observation.  I explained why patients are changed from inpatient to observation status or vice versa.   I spoke with his daughter again over the phone.  I spoke with his wife in patient's room. Patient and his wife were concerned about the hospital bill.  His wife was thinking about appealing the discharge.  I briefly explained how the appeal process works.  Daniel Billow, RN was at the bedside.  I requested that she notify the case manager or social worker on call if patient and family wanted to go ahead with the appeal process.       Consultants: None Procedures performed: None Disposition: Home Diet recommendation:  Discharge Diet Orders (From admission, onward)     Start     Ordered   11/01/22 0000  Diet -  low sodium heart healthy        11/01/22 1300   11/01/22 0000  Diet Carb Modified        11/01/22 1300           Cardiac and Carb modified diet DISCHARGE MEDICATION: Allergies as of 11/01/2022       Reactions   Penicillins Anaphylaxis   Neostigmine    PEA arrest        Medication List     STOP taking these medications    benzonatate 200 MG capsule Commonly known as: TESSALON       TAKE these medications    albuterol 108 (90 Base) MCG/ACT inhaler Commonly known as:  VENTOLIN HFA Inhale 2 puffs into the lungs every 6 (six) hours as needed for wheezing or shortness of breath.   amLODipine 2.5 MG tablet Commonly known as: NORVASC Take 1 tablet (2.5 mg total) by mouth daily.   aspirin 81 MG tablet Take 81 mg by mouth daily.   atorvastatin 20 MG tablet Commonly known as: LIPITOR TAKE 1 TABLET BY MOUTH AT  BEDTIME   chlorpheniramine-HYDROcodone 10-8 MG/5ML Commonly known as: TUSSIONEX Take 5 mLs by mouth every 12 (twelve) hours as needed for cough.   cholecalciferol 25 MCG (1000 UNIT) tablet Commonly known as: VITAMIN D3 Take 1,000 Units by mouth daily.   cyanocobalamin 1000 MCG tablet Commonly known as: VITAMIN B12 Take 1,000 mcg by mouth daily.   feeding supplement (GLUCERNA SHAKE) Liqd One a day   ferrous sulfate 324 MG Tbec Take by mouth.   FreeStyle Libre 2 Reader Daniel Hodges USE AS DIRECTED   FreeStyle Libre 2 Sensor Misc 1 Device by Does not apply route every 14 (fourteen) days.   furosemide 20 MG tablet Commonly known as: LASIX Take 1 tablet (20 mg total) by mouth daily.   gabapentin 300 MG capsule Commonly known as: NEURONTIN TAKE 1 CAPSULE BY MOUTH  DAILY AND 1 CAPSULE BY  MOUTH AT NIGHT   glucose blood test strip 1 each by Other route in the morning, at noon, in the evening, and at bedtime. Use as instructed   HumaLOG KwikPen 100 UNIT/ML KwikPen Generic drug: insulin lispro Inject 6-10 Units into the skin See admin instructions. Inject 6u under the skin daily at breakfast-time, 8u at lunch-time and inject 10u under the skin daily at dinner-time   hydrocortisone 1 % ointment Apply small amount mix with lotion at night for leg rash   ipratropium-albuterol 0.5-2.5 (3) MG/3ML Soln Commonly known as: DUONEB USE 3 ML VIA NEBULIZER EVERY 6 HOURS AS NEEDED   loratadine 10 MG tablet Commonly known as: CLARITIN Take 1 tablet (10 mg total) by mouth daily.   losartan 25 MG tablet Commonly known as: COZAAR Take 1 tablet (25 mg  total) by mouth daily.   Magnesium 250 MG Tabs Take 250 mg by mouth 2 (two) times daily.   montelukast 10 MG tablet Commonly known as: SINGULAIR TAKE 1 TABLET BY MOUTH DAILY   mupirocin ointment 2 % Commonly known as: BACTROBAN Apply 1 Application topically 2 (two) times daily. To superficial scratches on legs until resolved.   niacin 500 MG ER tablet Commonly known as: VITAMIN B3 niacin ER 500 mg tablet,extended release 24 hr   senna 8.6 MG tablet Commonly known as: SENOKOT Take 1 tablet (8.6 mg total) by mouth daily.   simethicone 80 MG chewable tablet Commonly known as: MYLICON Chew 2 tablets (160 mg total) by mouth 2 (two) times daily.  Stiolto Respimat 2.5-2.5 MCG/ACT Aers Generic drug: Tiotropium Bromide-Olodaterol Inhale 1 Inhalation into the lungs daily.   Toujeo SoloStar 300 UNIT/ML Solostar Pen Generic drug: insulin glargine (1 Unit Dial) Inject 25 Units into the skin at bedtime.        Discharge Exam: Filed Weights   10/31/22 0251  Weight: 75.5 kg   GEN: NAD SKIN: Warm and dry EYES: No pallor or icterus ENT: MMM CV: RRR PULM: CTA B ABD: soft, ND, NT, +BS CNS: AAO x 3, non focal EXT: No edema or tenderness   Condition at discharge: good  The results of significant diagnostics from this hospitalization (including imaging, microbiology, ancillary and laboratory) are listed below for reference.   Imaging Studies: CT HEAD WO CONTRAST (5MM)  Result Date: 10/31/2022 CLINICAL DATA:  Mental status change, unknown cause EXAM: CT HEAD WITHOUT CONTRAST TECHNIQUE: Contiguous axial images were obtained from the base of the skull through the vertex without intravenous contrast. RADIATION DOSE REDUCTION: This exam was performed according to the departmental dose-optimization program which includes automated exposure control, adjustment of the mA and/or kV according to patient size and/or use of iterative reconstruction technique. COMPARISON:  02/07/2020  FINDINGS: Brain: No evidence of acute infarction, hemorrhage, mass, mass effect, or midline shift. No hydrocephalus or extra-axial fluid collection. Age related cerebral atrophy, with commensurate ventricular enlargement. Vascular: No hyperdense vessel. Skull: Negative for fracture or focal lesion. Sinuses/Orbits: No acute finding. Status post bilateral lens replacements. Other: The mastoid air cells are well aerated. IMPRESSION: No acute intracranial process. Electronically Signed   By: Merilyn Baba M.D.   On: 10/31/2022 03:38   DG Chest Port 1 View  Result Date: 10/31/2022 CLINICAL DATA:  Sepsis EXAM: PORTABLE CHEST 1 VIEW COMPARISON:  10/13/2022 FINDINGS: Lungs are clear. No pneumothorax or pleural effusion. Coronary artery bypass grafting has been performed. Cardiac size within normal limits. Pulmonary vascularity is normal. No acute bone abnormality. IMPRESSION: 1. No active disease. Electronically Signed   By: Fidela Salisbury M.D.   On: 10/31/2022 03:37   DG Chest 2 View  Result Date: 10/13/2022 CLINICAL DATA:  Chronic cough for 3 months EXAM: CHEST - 2 VIEW COMPARISON:  Chest x-ray 10/17/2020 FINDINGS: The heart size and mediastinal contours are within normal limits. Patient is status post CABG. There is some chronic scarring in both lungs similar to the prior study. There is no new focal lung infiltrate, pleural effusion or pneumothorax. Skeletal structures are unremarkable. IMPRESSION: No active cardiopulmonary disease. Electronically Signed   By: Ronney Asters M.D.   On: 10/13/2022 23:14    Microbiology: Results for orders placed or performed during the hospital encounter of 10/31/22  Resp panel by RT-PCR (RSV, Flu A&B, Covid) Anterior Nasal Swab     Status: Abnormal   Collection Time: 10/31/22  2:43 AM   Specimen: Anterior Nasal Swab  Result Value Ref Range Status   SARS Coronavirus 2 by RT PCR POSITIVE (A) NEGATIVE Final    Comment: (NOTE) SARS-CoV-2 target nucleic acids are  DETECTED.  The SARS-CoV-2 RNA is generally detectable in upper respiratory specimens during the acute phase of infection. Positive results are indicative of the presence of the identified virus, but do not rule out bacterial infection or co-infection with other pathogens not detected by the test. Clinical correlation with patient history and other diagnostic information is necessary to determine patient infection status. The expected result is Negative.  Fact Sheet for Patients: EntrepreneurPulse.com.au  Fact Sheet for Healthcare Providers: IncredibleEmployment.be  This test is  not yet approved or cleared by the [place] and  has been authorized for detection and/or diagnosis of SARS-CoV-2 by FDA under an Emergency Use Authorization (EUA).  This EUA will remain in effect (meaning this test can be used) for the duration of  the COVID-19 declaration under Section 564(b)(1) of the A ct, 21 U.S.C. section 360bbb-3(b)(1), unless the authorization is terminated or revoked sooner.     Influenza A by PCR NEGATIVE NEGATIVE Final   Influenza B by PCR NEGATIVE NEGATIVE Final    Comment: (NOTE) The Xpert Xpress SARS-CoV-2/FLU/RSV plus assay is intended as an aid in the diagnosis of influenza from Nasopharyngeal swab specimens and should not be used as a sole basis for treatment. Nasal washings and aspirates are unacceptable for Xpert Xpress SARS-CoV-2/FLU/RSV testing.  Fact Sheet for Patients: EntrepreneurPulse.com.au  Fact Sheet for Healthcare Providers: IncredibleEmployment.be  This test is not yet approved or cleared by the [place] FDA and has been authorized for detection and/or diagnosis of SARS-CoV-2 by FDA under an Emergency Use Authorization (EUA). This EUA will remain in effect (meaning this test can be used) for the duration of the COVID-19 declaration under Section 564(b)(1) of the Act, 21  U.S.C. section 360bbb-3(b)(1), unless the authorization is terminated or revoked.     Resp Syncytial Virus by PCR NEGATIVE NEGATIVE Final    Comment: (NOTE) Fact Sheet for Patients: EntrepreneurPulse.com.au  Fact Sheet for Healthcare Providers: IncredibleEmployment.be  This test is not yet approved or cleared by the [place] FDA and has been authorized for detection and/or diagnosis of SARS-CoV-2 by FDA under an Emergency Use Authorization (EUA). This EUA will remain in effect (meaning this test can be used) for the duration of the COVID-19 declaration under Section 564(b)(1) of the Act, 21 U.S.C. section 360bbb-3(b)(1), unless the authorization is terminated or revoked.  Performed at [facility], [address] [place], [place] [address]   Blood Culture (routine x 2)     Status: None (Preliminary result)   Collection Time: 10/31/22  2:43 AM   Specimen: BLOOD  Result Value Ref Range Status   Specimen Description BLOOD BLOOD LEFT ARM  Final   Special Requests   Final    BOTTLES DRAWN AEROBIC AND ANAEROBIC Blood Culture adequate volume   Culture   Final    NO GROWTH 1 DAY Performed at [facility], [address] [place], [place] [address]    Report Status PENDING  Incomplete  Blood Culture (routine x 2)     Status: None (Preliminary result)   Collection Time: 10/31/22  2:43 AM   Specimen: BLOOD  Result Value Ref Range Status   Specimen Description BLOOD LEFT FA  Final   Special Requests   Final    BOTTLES DRAWN AEROBIC AND ANAEROBIC Blood Culture results may not be optimal due to an inadequate volume of blood received in culture bottles   Culture   Final    NO GROWTH 1 DAY Performed at [facility], [address], [place], [place] [address]    Report Status PENDING  Incomplete  Urine Culture (for pregnant, neutropenic or urologic patients or patients with an indwelling urinary catheter)      Status: None   Collection Time: 10/31/22  2:43 AM   Specimen: Urine, Random  Result Value Ref Range Status   Specimen Description   Final    URINE, RANDOM Performed at [facility], [address] [place], [place] [address]    Special  Requests   Final    NONE Performed at [facility], [address] [place], [place] [address]    Culture   Final    NO GROWTH Performed at [facility], [address], [place], [place] [address]    Report Status 11/01/2022 FINAL  Final    Labs: CBC: Recent Labs  Lab 10/31/22 0243  WBC 10.6*  NEUTROABS 9.1*  HGB 11.5*  HCT 35.6*  MCV 84.4  PLT [id number]   Basic Metabolic Panel: Recent Labs  Lab 10/31/22 0243 10/31/22 1656 11/01/22 0639  NA 127* 129* 131*  K 5.3* 4.7 4.5  CL 96* 100 101  CO2 23 20* 22  GLUCOSE 223* 214* 126*  BUN 23 22 24*  CREATININE 2.01* 1.15 1.03  CALCIUM 7.9* 7.8* 8.4*  MG 2.0  --   --    Liver Function Tests: Recent Labs  Lab 10/31/22 0243  AST 32  ALT 20  ALKPHOS 105  BILITOT 0.7  PROT 6.9  ALBUMIN 3.6   CBG: Recent Labs  Lab 10/31/22 2302 11/01/22 0110 11/01/22 0355 11/01/22 0815 11/01/22 1157  GLUCAP 118* 105* 135* 105* 120*    Discharge time spent: less than 30 minutes.  Signed: Jennye Boroughs, MD [facility] 11/01/2022

## 2022-11-01 NOTE — Procedures (Signed)
[facility] [address] [place], [address]    Complete Pulmonary Function Testing Interpretation:  FINDINGS:  Forced vital capacity is moderately decreased.  FEV1 is 1.04 L which is 45% predicted and is severely reduced.  Postbronchodilator no significant improvement in FEV1.  Total lung capacity is mildly decreased residual volume is increased FRC is normal.  DLCO is mildly decreased.  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

## 2022-11-01 NOTE — Plan of Care (Signed)
  Problem: Coping: Goal: Ability to adjust to condition or change in health will improve Outcome: Progressing   

## 2022-11-01 NOTE — Care Management CC44 (Signed)
Condition Code 44 Documentation Completed  Patient Details  Name: JASIRI PUYEAR MRN: [id number] Date of Birth: 1940/09/29   Condition Code 44 given:  Yes Patient signature on Condition Code 44 notice:  Yes Documentation of 2 MD's agreement:  Yes Code 44 added to claim:  Yes    Tiburcio Bash, LCSW 11/01/2022, 3:38 PM

## 2022-11-01 NOTE — Progress Notes (Signed)
TOC consult for transportation needs, CSW spoke with patient's daughter [name] who reports patient's spouse has enlisted help of additional family members to take wife to hospital today to pick patient up. She reports she will call should plans change. RN made aware.   Kelby Fam, [place], MSW, [place]

## 2022-11-02 DIAGNOSIS — E162 Hypoglycemia, unspecified: Secondary | ICD-10-CM | POA: Diagnosis not present

## 2022-11-02 HISTORY — DX: Hypoglycemia, unspecified: E16.2

## 2022-11-02 LAB — GLUCOSE, CAPILLARY
Glucose-Capillary: 24 mg/dL — CL (ref 70–99)
Glucose-Capillary: 35 mg/dL — CL (ref 70–99)
Glucose-Capillary: 66 mg/dL — ABNORMAL LOW (ref 70–99)
Glucose-Capillary: 102 mg/dL — ABNORMAL HIGH (ref 70–99)
Glucose-Capillary: 148 mg/dL — ABNORMAL HIGH (ref 70–99)

## 2022-11-02 MED ORDER — DEXTROSE 50 % IV SOLN
25.0000 g | INTRAVENOUS | Status: DC
  Filled 2022-11-02: qty 50

## 2022-11-02 MED ORDER — TOUJEO SOLOSTAR 300 UNIT/ML ~~LOC~~ SOPN: 15.0000 [IU] | PEN_INJECTOR | Freq: Every day | SUBCUTANEOUS | Status: DC

## 2022-11-02 MED ORDER — INSULIN GLARGINE-YFGN 100 UNIT/ML ~~LOC~~ SOLN
15.0000 [IU] | Freq: Every day | SUBCUTANEOUS | Status: DC
  Filled 2022-11-02: qty 0.15

## 2022-11-02 MED ORDER — INSULIN ASPART 100 UNIT/ML IJ SOLN
0.0000 [IU] | Freq: Three times a day (TID) | INTRAMUSCULAR | Status: DC
  Administered 2022-11-02: 1 [IU] via SUBCUTANEOUS
  Filled 2022-11-02: qty 1

## 2022-11-02 NOTE — Discharge Summary (Addendum)
Physician Discharge Summary   Patient: Daniel Hodges MRN: [id number] DOB: 29-Mar-1941  Admit date:     10/31/2022  Discharge date: 11/02/22  Discharge Physician: Daniel Hodges   PCP: Daniel Guise, MD   Recommendations at discharge:   Follow-up with PCP in 1 week  Discharge Diagnoses: Principal Problem:   Severe sepsis (Daniel Hodges) Active Problems:   Acute respiratory failure due to COVID-19 (Daniel Hodges)   AKI (acute kidney injury) (Daniel Hodges)   Acute metabolic encephalopathy   Uncontrolled type 2 diabetes mellitus with hyperglycemia, with long-term current use of insulin (HCC)   Frequent PVCs   Hyponatremia   Coronary artery disease due to lipid rich plaque   Essential hypertension   OSA on CPAP   Chronic bronchitis, obstructive   Elevated troponin   Hyperkalemia   Hypoglycemia  Resolved Problems:   * No resolved hospital problems. [facility] Course:  Daniel Hodges is a 82 y.o. male with medical history significant for For CAD, COPD, OSA on CPAP, HTN, insulin-dependent type 2 diabetes, who was brought to the hospital because of fever and change in mental status.  Reportedly, oxygen saturation was 84% on room air temperature 102 F.     He tested positive for COVID-19 infection.  He was admitted to the hospital for severe sepsis secondary to [id number] infection complicated by acute hypoxic respiratory failure, acute metabolic encephalopathy and AKI.  He was treated with IV fluids and steroids.  His condition improved significantly.  AKI and hypoxic respiratory failure have resolved.  He was able to ambulate on room air without oxygen desaturation.  Hyponatremia has improved with IV fluids.  Mental status has improved.  Patient was noted to be forgetful at times.  His daughter said that they were not sure whether recurrent episodes of hypoglycemia in the past has affected his cognition/memory.  She also thought that acute illness from COVID infection may have caused worsening  confusion.   He developed hyperglycemia and glucose went up to 345.  He is on Toujeo 25 units nightly at home.  He was started on insulin glargine 25 units nightly.  Unfortunately, he developed hypoglycemia the following morning with glucose in the 20s.  Hypoglycemia was successfully treated.  His daughter said that patient has had recurrent hypoglycemic episodes at home and she is concerned that Toujeo 25 units nightly may be too high for him.  It was decided, after discussion with his daughter [name]), that [name] will be decreased from 25 units nightly to 15 units nightly to reduce hypoglycemic episodes.  She understands that this may make patient more hyperglycemic.  This was also discussed with the patient.  Patient sees an endocrinologist for diabetes mellitus.  He also follows up with his primary care physician.  Close follow-up with PCP in endocrinologist for management of brittle diabetes mellitus was strongly recommended.    He feels better and he wants to be discharged home today.  He was able to ambulate in the hallway without any oxygen desaturation or symptoms such as dizziness, chest pain or shortness of breath.  He is deemed stable for discharge to home today.  Discharge plan was discussed with [name], daughter, over the phone.             Consultants: None Procedures performed: None  Disposition: Home Diet recommendation:  Discharge Diet Orders (From admission, onward)     Start     Ordered   11/01/22 0000  Diet - low sodium heart healthy  11/01/22 1300   11/01/22 0000  Diet Carb Modified        11/01/22 1300           Cardiac and Carb modified diet DISCHARGE MEDICATION: Allergies as of 11/02/2022       Reactions   Penicillins Anaphylaxis   Neostigmine    PEA arrest        Medication List     STOP taking these medications    benzonatate 200 MG capsule Commonly known as: TESSALON       TAKE these medications    albuterol 108 (90  Base) MCG/ACT inhaler Commonly known as: VENTOLIN HFA Inhale 2 puffs into the lungs every 6 (six) hours as needed for wheezing or shortness of breath.   amLODipine 2.5 MG tablet Commonly known as: NORVASC Take 1 tablet (2.5 mg total) by mouth daily.   aspirin 81 MG tablet Take 81 mg by mouth daily.   atorvastatin 20 MG tablet Commonly known as: LIPITOR TAKE 1 TABLET BY MOUTH AT  BEDTIME   chlorpheniramine-HYDROcodone 10-8 MG/5ML Commonly known as: TUSSIONEX Take 5 mLs by mouth every 12 (twelve) hours as needed for cough.   cholecalciferol 25 MCG (1000 UNIT) tablet Commonly known as: VITAMIN D3 Take 1,000 Units by mouth daily.   cyanocobalamin 1000 MCG tablet Commonly known as: VITAMIN B12 Take 1,000 mcg by mouth daily.   feeding supplement (GLUCERNA SHAKE) Liqd One a day   ferrous sulfate 324 MG Tbec Take by mouth.   furosemide 20 MG tablet Commonly known as: LASIX Take 1 tablet (20 mg total) by mouth daily.   gabapentin 300 MG capsule Commonly known as: NEURONTIN TAKE 1 CAPSULE BY MOUTH  DAILY AND 1 CAPSULE BY  MOUTH AT NIGHT   HumaLOG KwikPen 100 UNIT/ML KwikPen Generic drug: insulin lispro Inject 6-10 Units into the skin See admin instructions. Inject 6u under the skin daily at breakfast-time, 8u at lunch-time and inject 10u under the skin daily at dinner-time   hydrocortisone 1 % ointment Apply small amount mix with lotion at night for leg rash   ipratropium-albuterol 0.5-2.5 (3) MG/3ML Soln Commonly known as: DUONEB USE 3 ML VIA NEBULIZER EVERY 6 HOURS AS NEEDED   loratadine 10 MG tablet Commonly known as: CLARITIN Take 1 tablet (10 mg total) by mouth daily.   losartan 25 MG tablet Commonly known as: COZAAR Take 1 tablet (25 mg total) by mouth daily.   Magnesium 250 MG Tabs Take 250 mg by mouth 2 (two) times daily.   montelukast 10 MG tablet Commonly known as: SINGULAIR TAKE 1 TABLET BY MOUTH DAILY   mupirocin ointment 2 % Commonly known as:  BACTROBAN Apply 1 Application topically 2 (two) times daily. To superficial scratches on legs until resolved.   niacin 500 MG ER tablet Commonly known as: VITAMIN B3 niacin ER 500 mg tablet,extended release 24 hr   senna 8.6 MG tablet Commonly known as: SENOKOT Take 1 tablet (8.6 mg total) by mouth daily.   simethicone 80 MG chewable tablet Commonly known as: MYLICON Chew 2 tablets (160 mg total) by mouth 2 (two) times daily.   Stiolto Respimat 2.5-2.5 MCG/ACT Aers Generic drug: Tiotropium Bromide-Olodaterol Inhale 1 Inhalation into the lungs daily.   Toujeo SoloStar 300 UNIT/ML Solostar Pen Generic drug: insulin glargine (1 Unit Dial) Inject 15 Units into the skin at bedtime. What changed: how much to take        Discharge Exam: Filed Weights   10/31/22 0251  Weight: 75.5  kg   GEN: NAD SKIN: Warm and dry EYES: EOMI ENT: MMM CV: RRR PULM: CTA B ABD: soft, ND, NT, +BS CNS: AAO x 3, non focal EXT: No edema or tenderness   Condition at discharge: good  The results of significant diagnostics from this hospitalization (including imaging, microbiology, ancillary and laboratory) are listed below for reference.   Imaging Studies: CT HEAD WO CONTRAST (5MM)  Result Date: 10/31/2022 CLINICAL DATA:  Mental status change, unknown cause EXAM: CT HEAD WITHOUT CONTRAST TECHNIQUE: Contiguous axial images were obtained from the base of the skull through the vertex without intravenous contrast. RADIATION DOSE REDUCTION: This exam was performed according to the departmental dose-optimization program which includes automated exposure control, adjustment of the mA and/or kV according to patient size and/or use of iterative reconstruction technique. COMPARISON:  02/07/2020 FINDINGS: Brain: No evidence of acute infarction, hemorrhage, mass, mass effect, or midline shift. No hydrocephalus or extra-axial fluid collection. Age related cerebral atrophy, with commensurate ventricular  enlargement. Vascular: No hyperdense vessel. Skull: Negative for fracture or focal lesion. Sinuses/Orbits: No acute finding. Status post bilateral lens replacements. Other: The mastoid air cells are well aerated. IMPRESSION: No acute intracranial process. Electronically Signed   By: Merilyn Baba M.D.   On: 10/31/2022 03:38   DG Chest Port 1 View  Result Date: 10/31/2022 CLINICAL DATA:  Sepsis EXAM: PORTABLE CHEST 1 VIEW COMPARISON:  10/13/2022 FINDINGS: Lungs are clear. No pneumothorax or pleural effusion. Coronary artery bypass grafting has been performed. Cardiac size within normal limits. Pulmonary vascularity is normal. No acute bone abnormality. IMPRESSION: 1. No active disease. Electronically Signed   By: Fidela Salisbury M.D.   On: 10/31/2022 03:37   DG Chest 2 View  Result Date: 10/13/2022 CLINICAL DATA:  Chronic cough for 3 months EXAM: CHEST - 2 VIEW COMPARISON:  Chest x-ray 10/17/2020 FINDINGS: The heart size and mediastinal contours are within normal limits. Patient is status post CABG. There is some chronic scarring in both lungs similar to the prior study. There is no new focal lung infiltrate, pleural effusion or pneumothorax. Skeletal structures are unremarkable. IMPRESSION: No active cardiopulmonary disease. Electronically Signed   By: Ronney Asters M.D.   On: 10/13/2022 23:14    Microbiology: Results for orders placed or performed during the hospital encounter of 10/31/22  Resp panel by RT-PCR (RSV, Flu A&B, Covid) Anterior Nasal Swab     Status: Abnormal   Collection Time: 10/31/22  2:43 AM   Specimen: Anterior Nasal Swab  Result Value Ref Range Status   SARS Coronavirus 2 by RT PCR POSITIVE (A) NEGATIVE Final    Comment: (NOTE) SARS-CoV-2 target nucleic acids are DETECTED.  The SARS-CoV-2 RNA is generally detectable in upper respiratory specimens during the acute phase of infection. Positive results are indicative of the presence of the identified virus, but do not rule out  bacterial infection or co-infection with other pathogens not detected by the test. Clinical correlation with patient history and other diagnostic information is necessary to determine patient infection status. The expected result is Negative.  Fact Sheet for Patients: EntrepreneurPulse.com.au  Fact Sheet for Healthcare Providers: IncredibleEmployment.be  This test is not yet approved or cleared by the [place] FDA and  has been authorized for detection and/or diagnosis of SARS-CoV-2 by FDA under an Emergency Use Authorization (EUA).  This EUA will remain in effect (meaning this test can be used) for the duration of  the COVID-19 declaration under Section 564(b)(1) of the A ct, 21 U.S.C. section  360bbb-3(b)(1), unless the authorization is terminated or revoked sooner.     Influenza A by PCR NEGATIVE NEGATIVE Final   Influenza B by PCR NEGATIVE NEGATIVE Final    Comment: (NOTE) The Xpert Xpress SARS-CoV-2/FLU/RSV plus assay is intended as an aid in the diagnosis of influenza from Nasopharyngeal swab specimens and should not be used as a sole basis for treatment. Nasal washings and aspirates are unacceptable for Xpert Xpress SARS-CoV-2/FLU/RSV testing.  Fact Sheet for Patients: EntrepreneurPulse.com.au  Fact Sheet for Healthcare Providers: IncredibleEmployment.be  This test is not yet approved or cleared by the [place] FDA and has been authorized for detection and/or diagnosis of SARS-CoV-2 by FDA under an Emergency Use Authorization (EUA). This EUA will remain in effect (meaning this test can be used) for the duration of the COVID-19 declaration under Section 564(b)(1) of the Act, 21 U.S.C. section 360bbb-3(b)(1), unless the authorization is terminated or revoked.     Resp Syncytial Virus by PCR NEGATIVE NEGATIVE Final    Comment: (NOTE) Fact Sheet for  Patients: EntrepreneurPulse.com.au  Fact Sheet for Healthcare Providers: IncredibleEmployment.be  This test is not yet approved or cleared by the [place] FDA and has been authorized for detection and/or diagnosis of SARS-CoV-2 by FDA under an Emergency Use Authorization (EUA). This EUA will remain in effect (meaning this test can be used) for the duration of the COVID-19 declaration under Section 564(b)(1) of the Act, 21 U.S.C. section 360bbb-3(b)(1), unless the authorization is terminated or revoked.  Performed at [facility], [address] [place], [place] [address]   Blood Culture (routine x 2)     Status: None (Preliminary result)   Collection Time: 10/31/22  2:43 AM   Specimen: BLOOD  Result Value Ref Range Status   Specimen Description BLOOD BLOOD LEFT ARM  Final   Special Requests   Final    BOTTLES DRAWN AEROBIC AND ANAEROBIC Blood Culture adequate volume   Culture   Final    NO GROWTH 2 DAYS Performed at [facility], [address] [place], [place] [address]    Report Status PENDING  Incomplete  Blood Culture (routine x 2)     Status: None (Preliminary result)   Collection Time: 10/31/22  2:43 AM   Specimen: BLOOD  Result Value Ref Range Status   Specimen Description BLOOD LEFT FA  Final   Special Requests   Final    BOTTLES DRAWN AEROBIC AND ANAEROBIC Blood Culture results may not be optimal due to an inadequate volume of blood received in culture bottles   Culture   Final    NO GROWTH 2 DAYS Performed at [facility], [address], [place], [place] [address]    Report Status PENDING  Incomplete  Urine Culture (for pregnant, neutropenic or urologic patients or patients with an indwelling urinary catheter)     Status: None   Collection Time: 10/31/22  2:43 AM   Specimen: Urine, Random  Result Value Ref Range Status   Specimen Description   Final    URINE, RANDOM Performed at [facility], [address] [place], [place] [address]    Special Requests   Final    NONE Performed at [facility], [address] [place], [place] [address]    Culture   Final    NO GROWTH Performed at [facility], [place] [address] [place], [place] [address]    Report Status 11/01/2022 FINAL  Final    Labs: CBC: Recent  Labs  Lab 10/31/22 0243  WBC 10.6*  NEUTROABS 9.1*  HGB 11.5*  HCT 35.6*  MCV 84.4  PLT [id number]   Basic Metabolic Panel: Recent Labs  Lab 10/31/22 0243 10/31/22 1656 11/01/22 0639  NA 127* 129* 131*  K 5.3* 4.7 4.5  CL 96* 100 101  CO2 23 20* 22  GLUCOSE 223* 214* 126*  BUN 23 22 24*  CREATININE 2.01* 1.15 1.03  CALCIUM 7.9* 7.8* 8.4*  MG 2.0  --   --    Liver Function Tests: Recent Labs  Lab 10/31/22 0243  AST 32  ALT 20  ALKPHOS 105  BILITOT 0.7  PROT 6.9  ALBUMIN 3.6   CBG: Recent Labs  Lab 11/02/22 0419 11/02/22 0440 11/02/22 0501 11/02/22 0823 11/02/22 1139  GLUCAP 24* 35* 66* 102* 148*    Discharge time spent: greater than 30 minutes.  Signed: Jennye Boroughs, MD [facility] 11/02/2022

## 2022-11-02 NOTE — Progress Notes (Addendum)
Mobility Specialist - Progress Note    11/02/22 1357  Mobility  Activity Ambulated with assistance in room;Stood at bedside;Dangled on edge of bed  Level of Assistance Independent  Assistive Device None  Distance Ambulated (ft) 100 ft  Activity Response Tolerated well  Mobility Referral Yes  $Mobility charge 1 Mobility   Pt resting supine in bed on RA upon entry. Pt STS and ambulates around room with no AD indep/SBA for safety. Pt returned to bed and left with needs in reach. Bed alarm activated.   Loma Sender Mobility Specialist 11/02/22, 2:48 PM

## 2022-11-02 NOTE — Progress Notes (Signed)
Patient walked with nurse tech and mobility specialist. Patient remain above 90% with nurse tech during ambulation and at rest.

## 2022-11-02 NOTE — Progress Notes (Signed)
Mobility Specialist - Progress Note     11/02/22 1457  Mobility  Activity Ambulated with assistance in hallway  Level of Assistance Standby assist, set-up cues, supervision of patient - no hands on  Assistive Device None  Distance Ambulated (ft) 10 ft  Activity Response Tolerated well  Mobility Referral Yes  $Mobility charge 1 Mobility     [employer] Mobility Specialist 11/02/22, 3:03 PM

## 2022-11-02 NOTE — Progress Notes (Signed)
   11/02/22 1400  Mobility  Activity Ambulated independently to bathroom;Stood at bedside  Level of Assistance Independent  Assistive Device None  Distance Ambulated (ft) 10 ft  Activity Response Tolerated well  Mobility Referral Yes  $Mobility charge 1 Mobility     [employer] Mobility Specialist 11/02/22, 2:50 PM

## 2022-11-02 NOTE — Progress Notes (Signed)
Mobility Specialist - Progress Note    11/02/22 1450  Mobility  Activity Ambulated with assistance in hallway;Stood at bedside  Level of Assistance Independent  Assistive Device None  Distance Ambulated (ft) 480 ft  Activity Response Tolerated well  Mobility Referral Yes  $Mobility charge 1 Mobility   Rn/MD requested ambulation around NS with mask. Pt resting in bed on upon entry. Pt STS and ambulates around NS for 3 laps indep/SBA for safety. Pt returned to bed and left with needs in reach and bed alarm activated.   Loma Sender Mobility Specialist 11/02/22, 2:54 PM

## 2022-11-02 NOTE — Progress Notes (Incomplete)
{  Select_TRH_Note:[id number]} 

## 2022-11-02 NOTE — TOC Progression Note (Addendum)
Transition of Care [facility]) - Progression Note    Patient Details  Name: Daniel Hodges MRN: [id number] Date of Birth: [birth year]  Transition of Care [facility]) CM/SW [place], [address] Phone Number: 11/02/2022, 11:40 AM  Clinical Narrative:     CSW spoke with pt's spouse by phone, inquired on concerns preventing pt from dc, spouse states that there are various concerns (details incidents that occurred with nursing staff-not related to pt dc) then states that when pt was walking pt was very shaky and weak. Spouse states she herself is in a wheelchair and she cannot take care of pt in this condition. She states pt was not in this condition prior to arriving to the ED, states she would like pt to be looked over again by the MD. Per RN family refused dc because they wanted to know how much pt's bill would be at shift change, spouse did not mention to this CSW anything about accounting/billing or insurance. MD and RN updated on spouse's request. TOC will continue to follow.         Expected Discharge Plan and Services         Expected Discharge Date: 11/01/22                                     Social Determinants of Health (SDOH) Interventions SDOH Screenings   Food Insecurity: No Food Insecurity (10/31/2022)  Housing: Low Risk  (10/31/2022)  Transportation Needs: No Transportation Needs (10/31/2022)  Utilities: Not At Risk (10/31/2022)  Alcohol Screen: Low Risk  (09/19/2021)  Depression (PHQ2-9): Low Risk  (10/10/2022)  Tobacco Use: Low Risk  (10/31/2022)    Readmission Risk Interventions     No data to display

## 2022-11-05 ENCOUNTER — Telehealth: Payer: Self-pay | Admitting: Internal Medicine

## 2022-11-05 ENCOUNTER — Telehealth (INDEPENDENT_AMBULATORY_CARE_PROVIDER_SITE_OTHER): Payer: Medicare Other | Admitting: Internal Medicine

## 2022-11-05 VITALS — Ht 66.0 in | Wt 166.0 lb

## 2022-11-05 DIAGNOSIS — Z794 Long term (current) use of insulin: Secondary | ICD-10-CM | POA: Diagnosis not present

## 2022-11-05 DIAGNOSIS — R5381 Other malaise: Secondary | ICD-10-CM

## 2022-11-05 DIAGNOSIS — U071 COVID-19: Secondary | ICD-10-CM | POA: Diagnosis not present

## 2022-11-05 DIAGNOSIS — Z09 Encounter for follow-up examination after completed treatment for conditions other than malignant neoplasm: Secondary | ICD-10-CM

## 2022-11-05 DIAGNOSIS — E1165 Type 2 diabetes mellitus with hyperglycemia: Secondary | ICD-10-CM | POA: Diagnosis not present

## 2022-11-05 DIAGNOSIS — J9600 Acute respiratory failure, unspecified whether with hypoxia or hypercapnia: Secondary | ICD-10-CM | POA: Diagnosis not present

## 2022-11-05 DIAGNOSIS — N179 Acute kidney failure, unspecified: Secondary | ICD-10-CM | POA: Diagnosis not present

## 2022-11-05 NOTE — Addendum Note (Signed)
Addended by: Lavera Guise on: 11/05/2022 09:43 AM   Modules accepted: Level of Service

## 2022-11-05 NOTE — Telephone Encounter (Signed)
Lvm to change today's appointment to virtual with [email or web address]

## 2022-11-05 NOTE — Progress Notes (Addendum)
[facility] [place], [place] [address]  Internal MEDICINE  Telephone Visit  Patient Name: Daniel Hodges  [id number]                                          Transitional care management   [id number]                         Date of admission 10/31/2022                                            Date  of  discharge 11/02/2022  Date of Service: 11/05/2022  I connected with the patient at 62 by telephone and verified the patients identity using two identifiers.   I discussed the limitations, risks, security and privacy concerns of performing an evaluation and management service by telephone and the availability of in person appointments. I also discussed with the patient that there may be a patient responsible charge related to the service.  The patient expressed understanding and agrees to proceed.    Chief Complaint  Patient presents with   Telephone Assessment    [phone]    Telephone [facility] follow up Covid positive and sepsis    Cough    Clear mucus     HPI  Pt is connected for hospital admission  Admitted with COVID and severe sepsis  Complicated by acute hypoxic respiratory failure  Reduced Toujeo to 15 units but am hyperglycemia  Still is very weak per wife and needs assistance  Daughter is a nurse in [place], might need FMLA filled out so she can travel to [place] to cough and has congestion   Hospital Course:   Daniel Hodges is a 82 y.o. male with medical history significant for For CAD, COPD, OSA on CPAP, HTN, insulin-dependent type 2 diabetes, who was brought to the hospital because of fever and change in mental status.  Reportedly, oxygen saturation was 84% on room air temperature 102 F.     He tested positive for COVID-19 infection.  He was admitted to the hospital for severe sepsis secondary to [id number] infection complicated by acute hypoxic respiratory failure, acute metabolic encephalopathy and AKI.  He  was treated with IV fluids and steroids.  His condition improved significantly.  AKI and hypoxic respiratory failure have resolved.  He was able to ambulate on room air without oxygen desaturation.  Hyponatremia has improved with IV fluids.  Mental status has improved.  Patient was noted to be forgetful at times.  His daughter said that they were not sure whether recurrent episodes of hypoglycemia in the past has affected his cognition/memory.  She also thought that acute illness from COVID infection may have caused worsening confusion.     He developed hyperglycemia and glucose went up to 345.  He is on Toujeo 25 units nightly at home.  He was started on insulin glargine 25 units nightly.  Unfortunately, he developed hypoglycemia the following morning with glucose in the 20s.  Hypoglycemia was successfully treated.  His daughter said that patient has had recurrent hypoglycemic episodes at home and she is concerned that Toujeo 25 units nightly may be too high for him.  It was decided, after discussion with his daughter [name]), that [name] will be decreased from 25 units nightly to 15 units nightly to reduce hypoglycemic episodes.  She understands that this may make patient more hyperglycemic.  This was also discussed with the patient.  Patient sees an endocrinologist for diabetes mellitus.  He also follows up with his primary care physician.  Close follow-up with PCP in endocrinologist for management of brittle diabetes mellitus was strongly recommended.    Current Medication: Outpatient Encounter Medications as of 11/05/2022  Medication Sig   albuterol (VENTOLIN HFA) 108 (90 Base) MCG/ACT inhaler Inhale 2 puffs into the lungs every 6 (six) hours as needed for wheezing or shortness of breath.   amLODipine (NORVASC) 2.5 MG tablet Take 1 tablet (2.5 mg total) by mouth daily.   aspirin 81 MG tablet Take 81 mg by mouth daily.   atorvastatin (LIPITOR) 20 MG tablet TAKE 1 TABLET BY MOUTH AT  BEDTIME    chlorpheniramine-HYDROcodone (TUSSIONEX) 10-8 MG/5ML Take 5 mLs by mouth every 12 (twelve) hours as needed for cough.   cholecalciferol (VITAMIN D3) 25 MCG (1000 UT) tablet Take 1,000 Units by mouth daily.   feeding supplement, GLUCERNA SHAKE, (GLUCERNA SHAKE) LIQD One a day   ferrous sulfate 324 MG TBEC Take by mouth.   furosemide (LASIX) 20 MG tablet Take 1 tablet (20 mg total) by mouth daily.   gabapentin (NEURONTIN) 300 MG capsule TAKE 1 CAPSULE BY MOUTH  DAILY AND 1 CAPSULE BY  MOUTH AT NIGHT   HUMALOG KWIKPEN 100 UNIT/ML KwikPen Inject 6-10 Units into the skin See admin instructions. Inject 6u under the skin daily at breakfast-time, 8u at lunch-time and inject 10u under the skin daily at dinner-time   hydrocortisone 1 % ointment Apply small amount mix with lotion at night for leg rash   ipratropium-albuterol (DUONEB) 0.5-2.5 (3) MG/3ML SOLN USE 3 ML VIA NEBULIZER EVERY 6 HOURS AS NEEDED   loratadine (CLARITIN) 10 MG tablet Take 1 tablet (10 mg total) by mouth daily.   losartan (COZAAR) 25 MG tablet Take 1 tablet (25 mg total) by mouth daily.   Magnesium 250 MG TABS Take 250 mg by mouth 2 (two) times daily.   montelukast (SINGULAIR) 10 MG tablet TAKE 1 TABLET BY MOUTH DAILY   mupirocin ointment (BACTROBAN) 2 % Apply 1 Application topically 2 (two) times daily. To superficial scratches on legs until resolved.   niacin (NIASPAN) 500 MG CR tablet niacin ER 500 mg tablet,extended release 24 hr   senna (SENOKOT) 8.6 MG tablet Take 1 tablet (8.6 mg total) by mouth daily.   simethicone (MYLICON) 80 MG chewable tablet Chew 2 tablets (160 mg total) by mouth 2 (two) times daily.   Tiotropium Bromide-Olodaterol (STIOLTO RESPIMAT) 2.5-2.5 MCG/ACT AERS Inhale 1 Inhalation into the lungs daily.   TOUJEO SOLOSTAR 300 UNIT/ML Solostar Pen Inject 15 Units into the skin at bedtime.   vitamin B-12 (CYANOCOBALAMIN) 1000 MCG tablet Take 1,000 mcg by mouth daily.   No facility-administered encounter medications  on file as of 11/05/2022.    Surgical History: Past Surgical History:  Procedure Laterality Date   APPENDECTOMY     COLONOSCOPY WITH PROPOFOL N/A 06/11/2015   Procedure: COLONOSCOPY WITH PROPOFOL;  Surgeon: Manya Silvas, MD;  Location: [facility] ENDOSCOPY;  Service: Endoscopy;  Laterality: N/A;   CORONARY ARTERY BYPASS GRAFT     HERNIA REPAIR     TEE WITHOUT CARDIOVERSION     TRACHEOSTOMY     VASCULAR SURGERY  Medical History: Past Medical History:  Diagnosis Date   Anginal pain ([place])    Asthma    Coronary artery disease    Diabetes mellitus without complication ([place])    Hyperlipidemia    Hypertension    Sleep apnea     Family History: Family History  Problem Relation Age of Onset   Cancer Sister    Diabetes Daughter    Diabetes Son     Social History   Socioeconomic History   Marital status: Married    Spouse name: Not on file   Number of children: Not on file   Years of education: Not on file   Highest education level: Not on file  Occupational History   Not on file  Tobacco Use   Smoking status: Never   Smokeless tobacco: Never  Vaping Use   Vaping Use: Never used  Substance and Sexual Activity   Alcohol use: No   Drug use: No   Sexual activity: Not on file  Other Topics Concern   Not on file  Social History Narrative   Not on file   Social Determinants of Health   Financial Resource Strain: Not on file  Food Insecurity: No Food Insecurity (10/31/2022)   Hunger Vital Sign    Worried About Running Out of Food in the Last Year: Never true    Ran Out of Food in the Last Year: Never true  Transportation Needs: No Transportation Needs (10/31/2022)   PRAPARE - Hydrologist (Medical): No    Lack of Transportation (Non-Medical): No  Physical Activity: Not on file  Stress: Not on file  Social Connections: Not on file  Intimate Partner Violence: Not At Risk (10/31/2022)   Humiliation, Afraid, Rape, and Kick questionnaire     Fear of Current or Ex-Partner: No    Emotionally Abused: No    Physically Abused: No    Sexually Abused: No      Review of Systems  Constitutional:  Positive for fatigue. Negative for fever.  HENT:  Negative for congestion, mouth sores and postnasal drip.   Respiratory:  Positive for cough.   Cardiovascular:  Negative for chest pain.  Genitourinary:  Negative for flank pain.  Psychiatric/Behavioral:  Positive for confusion and sleep disturbance.     Vital Signs: Ht 5' 6"$  (1.676 m)   Wt 166 lb (75.3 kg)   BMI 26.79 kg/m    Observation/Objective: Weak and frail     Assessment/Plan: 1. Encounter for examination following treatment at hospital All medications and medical records are reviewed.  - Ambulatory referral to Home Health  2. Acute respiratory failure due to COVID-19 [facility]) Resolved and improving  - Ambulatory referral to [facility]  3. Physical debility Will get PT - Ambulatory referral to [facility]  4. Type 2 diabetes mellitus with hyperglycemia, with long-term current use of insulin (HCC) Increase Toujeo to 18 units, continue mealtime sliding scale 6-8 units  - Ambulatory referral to [facility]  5. AKI (acute kidney injury) ([place]) Resolved  - Ambulatory referral to Home Health   General Counseling: Leveon verbalizes understanding of the findings of today's phone visit and agrees with plan of treatment. I have discussed any further diagnostic evaluation that may be needed or ordered today. We also reviewed his medications today. he has been encouraged to call the office with any questions or concerns that should arise related to todays visit.    Orders Placed This Encounter  Procedures   Ambulatory  referral to Home Health    No orders of the defined types were placed in this encounter.   Time spent:45 Minutes    Dr Lavera Guise Internal medicine

## 2022-11-06 ENCOUNTER — Other Ambulatory Visit: Payer: Self-pay

## 2022-11-06 ENCOUNTER — Telehealth: Payer: Self-pay

## 2022-11-06 DIAGNOSIS — J4540 Moderate persistent asthma, uncomplicated: Secondary | ICD-10-CM

## 2022-11-06 LAB — CULTURE, BLOOD (ROUTINE X 2)
Culture: NO GROWTH
Culture: NO GROWTH
Special Requests: ADEQUATE

## 2022-11-06 MED ORDER — STIOLTO RESPIMAT 2.5-2.5 MCG/ACT IN AERS: 1.0000 | INHALATION_SPRAY | Freq: Every day | RESPIRATORY_TRACT | 11 refills | Status: AC

## 2022-11-06 MED ORDER — VITAMIN B-12 1000 MCG PO TABS: 1000.0000 ug | ORAL_TABLET | Freq: Every day | ORAL | 1 refills | Status: DC

## 2022-11-06 NOTE — Telephone Encounter (Signed)
Pt advised we sent B12 to [facility]

## 2022-11-06 NOTE — Telephone Encounter (Signed)
[facility] take his referral just spoke  with [facility]

## 2022-11-07 ENCOUNTER — Other Ambulatory Visit: Payer: Self-pay

## 2022-11-07 DIAGNOSIS — J4540 Moderate persistent asthma, uncomplicated: Secondary | ICD-10-CM

## 2022-11-08 DIAGNOSIS — E1165 Type 2 diabetes mellitus with hyperglycemia: Secondary | ICD-10-CM | POA: Diagnosis not present

## 2022-11-08 DIAGNOSIS — U071 COVID-19: Secondary | ICD-10-CM | POA: Diagnosis not present

## 2022-11-08 DIAGNOSIS — N179 Acute kidney failure, unspecified: Secondary | ICD-10-CM | POA: Diagnosis not present

## 2022-11-08 DIAGNOSIS — J9600 Acute respiratory failure, unspecified whether with hypoxia or hypercapnia: Secondary | ICD-10-CM | POA: Diagnosis not present

## 2022-11-10 ENCOUNTER — Telehealth: Payer: Self-pay

## 2022-11-10 NOTE — Telephone Encounter (Signed)
Faxed office note to [facility]

## 2022-11-11 ENCOUNTER — Telehealth: Payer: Self-pay

## 2022-11-11 DIAGNOSIS — N179 Acute kidney failure, unspecified: Secondary | ICD-10-CM | POA: Diagnosis not present

## 2022-11-11 DIAGNOSIS — J9600 Acute respiratory failure, unspecified whether with hypoxia or hypercapnia: Secondary | ICD-10-CM | POA: Diagnosis not present

## 2022-11-11 DIAGNOSIS — E1165 Type 2 diabetes mellitus with hyperglycemia: Secondary | ICD-10-CM | POA: Diagnosis not present

## 2022-11-11 DIAGNOSIS — U071 COVID-19: Secondary | ICD-10-CM | POA: Diagnosis not present

## 2022-11-11 NOTE — Telephone Encounter (Signed)
Gave verbal order to [facility] [id number] physical therapy once a week for 4 week for Physical therapy

## 2022-11-12 ENCOUNTER — Other Ambulatory Visit: Payer: Self-pay

## 2022-11-12 ENCOUNTER — Other Ambulatory Visit: Payer: Self-pay | Admitting: Internal Medicine

## 2022-11-12 DIAGNOSIS — U071 COVID-19: Secondary | ICD-10-CM | POA: Diagnosis not present

## 2022-11-12 DIAGNOSIS — I10 Essential (primary) hypertension: Secondary | ICD-10-CM

## 2022-11-12 DIAGNOSIS — E1165 Type 2 diabetes mellitus with hyperglycemia: Secondary | ICD-10-CM | POA: Diagnosis not present

## 2022-11-12 DIAGNOSIS — N179 Acute kidney failure, unspecified: Secondary | ICD-10-CM | POA: Diagnosis not present

## 2022-11-12 DIAGNOSIS — J9600 Acute respiratory failure, unspecified whether with hypoxia or hypercapnia: Secondary | ICD-10-CM | POA: Diagnosis not present

## 2022-11-12 MED ORDER — AMLODIPINE BESYLATE 2.5 MG PO TABS: 2.5000 mg | ORAL_TABLET | Freq: Every day | ORAL | 0 refills | Status: DC

## 2022-11-14 ENCOUNTER — Telehealth: Payer: Self-pay | Admitting: Internal Medicine

## 2022-11-14 NOTE — Telephone Encounter (Signed)
Received FMLA [employer] form. Gave to dfk for signature-nm

## 2022-11-18 LAB — PULMONARY FUNCTION TEST

## 2022-11-19 ENCOUNTER — Telehealth: Payer: Self-pay | Admitting: Internal Medicine

## 2022-11-19 DIAGNOSIS — I10 Essential (primary) hypertension: Secondary | ICD-10-CM | POA: Diagnosis not present

## 2022-11-19 DIAGNOSIS — E7800 Pure hypercholesterolemia, unspecified: Secondary | ICD-10-CM | POA: Diagnosis not present

## 2022-11-19 DIAGNOSIS — E1165 Type 2 diabetes mellitus with hyperglycemia: Secondary | ICD-10-CM | POA: Diagnosis not present

## 2022-11-19 NOTE — Telephone Encounter (Signed)
FMLA paperwork for daughter completed and faxed back to [facility]; [phone]. Notified daughter-Toni

## 2022-11-21 DIAGNOSIS — N179 Acute kidney failure, unspecified: Secondary | ICD-10-CM | POA: Diagnosis not present

## 2022-11-21 DIAGNOSIS — J9600 Acute respiratory failure, unspecified whether with hypoxia or hypercapnia: Secondary | ICD-10-CM | POA: Diagnosis not present

## 2022-11-21 DIAGNOSIS — E1165 Type 2 diabetes mellitus with hyperglycemia: Secondary | ICD-10-CM | POA: Diagnosis not present

## 2022-11-21 DIAGNOSIS — U071 COVID-19: Secondary | ICD-10-CM | POA: Diagnosis not present

## 2022-11-24 DIAGNOSIS — N179 Acute kidney failure, unspecified: Secondary | ICD-10-CM | POA: Diagnosis not present

## 2022-11-24 DIAGNOSIS — U071 COVID-19: Secondary | ICD-10-CM | POA: Diagnosis not present

## 2022-11-24 DIAGNOSIS — E1165 Type 2 diabetes mellitus with hyperglycemia: Secondary | ICD-10-CM | POA: Diagnosis not present

## 2022-11-24 DIAGNOSIS — J9600 Acute respiratory failure, unspecified whether with hypoxia or hypercapnia: Secondary | ICD-10-CM | POA: Diagnosis not present

## 2022-11-26 DIAGNOSIS — E1165 Type 2 diabetes mellitus with hyperglycemia: Secondary | ICD-10-CM | POA: Diagnosis not present

## 2022-11-26 DIAGNOSIS — N179 Acute kidney failure, unspecified: Secondary | ICD-10-CM | POA: Diagnosis not present

## 2022-11-26 DIAGNOSIS — U071 COVID-19: Secondary | ICD-10-CM | POA: Diagnosis not present

## 2022-11-26 DIAGNOSIS — J9600 Acute respiratory failure, unspecified whether with hypoxia or hypercapnia: Secondary | ICD-10-CM | POA: Diagnosis not present

## 2022-11-27 DIAGNOSIS — I10 Essential (primary) hypertension: Secondary | ICD-10-CM | POA: Diagnosis not present

## 2022-11-27 DIAGNOSIS — G4730 Sleep apnea, unspecified: Secondary | ICD-10-CM | POA: Diagnosis not present

## 2022-11-27 DIAGNOSIS — E1165 Type 2 diabetes mellitus with hyperglycemia: Secondary | ICD-10-CM | POA: Diagnosis not present

## 2022-11-27 DIAGNOSIS — I2510 Atherosclerotic heart disease of native coronary artery without angina pectoris: Secondary | ICD-10-CM | POA: Diagnosis not present

## 2022-11-27 DIAGNOSIS — E7800 Pure hypercholesterolemia, unspecified: Secondary | ICD-10-CM | POA: Diagnosis not present

## 2022-11-27 DIAGNOSIS — J4530 Mild persistent asthma, uncomplicated: Secondary | ICD-10-CM | POA: Diagnosis not present

## 2022-12-01 DIAGNOSIS — E1165 Type 2 diabetes mellitus with hyperglycemia: Secondary | ICD-10-CM | POA: Diagnosis not present

## 2022-12-01 DIAGNOSIS — U071 COVID-19: Secondary | ICD-10-CM | POA: Diagnosis not present

## 2022-12-01 DIAGNOSIS — J9600 Acute respiratory failure, unspecified whether with hypoxia or hypercapnia: Secondary | ICD-10-CM | POA: Diagnosis not present

## 2022-12-01 DIAGNOSIS — N179 Acute kidney failure, unspecified: Secondary | ICD-10-CM | POA: Diagnosis not present

## 2022-12-02 DIAGNOSIS — U071 COVID-19: Secondary | ICD-10-CM | POA: Diagnosis not present

## 2022-12-02 DIAGNOSIS — N179 Acute kidney failure, unspecified: Secondary | ICD-10-CM | POA: Diagnosis not present

## 2022-12-02 DIAGNOSIS — J9600 Acute respiratory failure, unspecified whether with hypoxia or hypercapnia: Secondary | ICD-10-CM | POA: Diagnosis not present

## 2022-12-02 DIAGNOSIS — E1165 Type 2 diabetes mellitus with hyperglycemia: Secondary | ICD-10-CM | POA: Diagnosis not present

## 2022-12-10 DIAGNOSIS — Z794 Long term (current) use of insulin: Secondary | ICD-10-CM | POA: Diagnosis not present

## 2022-12-10 DIAGNOSIS — E871 Hypo-osmolality and hyponatremia: Secondary | ICD-10-CM | POA: Diagnosis not present

## 2022-12-10 DIAGNOSIS — G9341 Metabolic encephalopathy: Secondary | ICD-10-CM | POA: Diagnosis not present

## 2022-12-10 DIAGNOSIS — A419 Sepsis, unspecified organism: Secondary | ICD-10-CM | POA: Diagnosis not present

## 2022-12-10 DIAGNOSIS — I2510 Atherosclerotic heart disease of native coronary artery without angina pectoris: Secondary | ICD-10-CM | POA: Diagnosis not present

## 2022-12-10 DIAGNOSIS — Z79899 Other long term (current) drug therapy: Secondary | ICD-10-CM | POA: Diagnosis not present

## 2022-12-10 DIAGNOSIS — I10 Essential (primary) hypertension: Secondary | ICD-10-CM | POA: Diagnosis not present

## 2022-12-10 DIAGNOSIS — J4489 Other specified chronic obstructive pulmonary disease: Secondary | ICD-10-CM | POA: Diagnosis not present

## 2022-12-10 DIAGNOSIS — G4733 Obstructive sleep apnea (adult) (pediatric): Secondary | ICD-10-CM | POA: Diagnosis not present

## 2022-12-10 DIAGNOSIS — E875 Hyperkalemia: Secondary | ICD-10-CM | POA: Diagnosis not present

## 2022-12-10 DIAGNOSIS — E1165 Type 2 diabetes mellitus with hyperglycemia: Secondary | ICD-10-CM | POA: Diagnosis not present

## 2022-12-10 DIAGNOSIS — U071 COVID-19: Secondary | ICD-10-CM | POA: Diagnosis not present

## 2022-12-26 DIAGNOSIS — G4733 Obstructive sleep apnea (adult) (pediatric): Secondary | ICD-10-CM | POA: Diagnosis not present

## 2022-12-26 DIAGNOSIS — J4489 Other specified chronic obstructive pulmonary disease: Secondary | ICD-10-CM | POA: Diagnosis not present

## 2022-12-26 DIAGNOSIS — E871 Hypo-osmolality and hyponatremia: Secondary | ICD-10-CM | POA: Diagnosis not present

## 2022-12-26 DIAGNOSIS — E875 Hyperkalemia: Secondary | ICD-10-CM | POA: Diagnosis not present

## 2022-12-26 DIAGNOSIS — Z79899 Other long term (current) drug therapy: Secondary | ICD-10-CM | POA: Diagnosis not present

## 2022-12-26 DIAGNOSIS — E1165 Type 2 diabetes mellitus with hyperglycemia: Secondary | ICD-10-CM | POA: Diagnosis not present

## 2022-12-26 DIAGNOSIS — I2510 Atherosclerotic heart disease of native coronary artery without angina pectoris: Secondary | ICD-10-CM | POA: Diagnosis not present

## 2022-12-26 DIAGNOSIS — A419 Sepsis, unspecified organism: Secondary | ICD-10-CM | POA: Diagnosis not present

## 2022-12-26 DIAGNOSIS — U071 COVID-19: Secondary | ICD-10-CM | POA: Diagnosis not present

## 2022-12-26 DIAGNOSIS — G9341 Metabolic encephalopathy: Secondary | ICD-10-CM | POA: Diagnosis not present

## 2022-12-26 DIAGNOSIS — Z794 Long term (current) use of insulin: Secondary | ICD-10-CM | POA: Diagnosis not present

## 2022-12-26 DIAGNOSIS — I10 Essential (primary) hypertension: Secondary | ICD-10-CM | POA: Diagnosis not present

## 2023-01-02 DIAGNOSIS — J4489 Other specified chronic obstructive pulmonary disease: Secondary | ICD-10-CM | POA: Diagnosis not present

## 2023-01-02 DIAGNOSIS — I2510 Atherosclerotic heart disease of native coronary artery without angina pectoris: Secondary | ICD-10-CM | POA: Diagnosis not present

## 2023-01-02 DIAGNOSIS — G9341 Metabolic encephalopathy: Secondary | ICD-10-CM | POA: Diagnosis not present

## 2023-01-02 DIAGNOSIS — A419 Sepsis, unspecified organism: Secondary | ICD-10-CM | POA: Diagnosis not present

## 2023-01-02 DIAGNOSIS — G4733 Obstructive sleep apnea (adult) (pediatric): Secondary | ICD-10-CM | POA: Diagnosis not present

## 2023-01-02 DIAGNOSIS — E1165 Type 2 diabetes mellitus with hyperglycemia: Secondary | ICD-10-CM | POA: Diagnosis not present

## 2023-01-02 DIAGNOSIS — U071 COVID-19: Secondary | ICD-10-CM | POA: Diagnosis not present

## 2023-01-02 DIAGNOSIS — E875 Hyperkalemia: Secondary | ICD-10-CM | POA: Diagnosis not present

## 2023-01-02 DIAGNOSIS — E871 Hypo-osmolality and hyponatremia: Secondary | ICD-10-CM | POA: Diagnosis not present

## 2023-01-02 DIAGNOSIS — Z794 Long term (current) use of insulin: Secondary | ICD-10-CM | POA: Diagnosis not present

## 2023-01-02 DIAGNOSIS — I10 Essential (primary) hypertension: Secondary | ICD-10-CM | POA: Diagnosis not present

## 2023-01-02 DIAGNOSIS — Z79899 Other long term (current) drug therapy: Secondary | ICD-10-CM | POA: Diagnosis not present

## 2023-01-09 ENCOUNTER — Ambulatory Visit (INDEPENDENT_AMBULATORY_CARE_PROVIDER_SITE_OTHER): Payer: Medicare Other | Admitting: Nurse Practitioner

## 2023-01-09 ENCOUNTER — Telehealth: Payer: Self-pay | Admitting: Nurse Practitioner

## 2023-01-09 ENCOUNTER — Encounter: Payer: Self-pay | Admitting: Nurse Practitioner

## 2023-01-09 VITALS — BP 109/69 | HR 74 | Temp 98.3°F | Resp 16 | Ht 66.0 in | Wt 162.2 lb

## 2023-01-09 DIAGNOSIS — R053 Chronic cough: Secondary | ICD-10-CM | POA: Diagnosis not present

## 2023-01-09 DIAGNOSIS — E1165 Type 2 diabetes mellitus with hyperglycemia: Secondary | ICD-10-CM

## 2023-01-09 DIAGNOSIS — I10 Essential (primary) hypertension: Secondary | ICD-10-CM | POA: Diagnosis not present

## 2023-01-09 DIAGNOSIS — J449 Chronic obstructive pulmonary disease, unspecified: Secondary | ICD-10-CM

## 2023-01-09 DIAGNOSIS — Z79899 Other long term (current) drug therapy: Secondary | ICD-10-CM

## 2023-01-09 DIAGNOSIS — Z794 Long term (current) use of insulin: Secondary | ICD-10-CM | POA: Diagnosis not present

## 2023-01-09 MED ORDER — HYDROCOD POLI-CHLORPHE POLI ER 10-8 MG/5ML PO SUER
5.0000 mL | Freq: Two times a day (BID) | ORAL | 0 refills | Status: DC | PRN
Start: 2023-01-09 — End: 2023-11-12

## 2023-01-09 MED ORDER — MONTELUKAST SODIUM 10 MG PO TABS: 10.0000 mg | ORAL_TABLET | Freq: Every day | ORAL | 2 refills | Status: DC

## 2023-01-09 MED ORDER — FREESTYLE LIBRE 2 SENSOR MISC: 5 refills | Status: DC

## 2023-01-09 MED ORDER — LORATADINE 10 MG PO TABS: 10.0000 mg | ORAL_TABLET | Freq: Every day | ORAL | 3 refills | Status: AC

## 2023-01-09 MED ORDER — ATORVASTATIN CALCIUM 20 MG PO TABS: 20.0000 mg | ORAL_TABLET | Freq: Every day | ORAL | 3 refills | Status: DC

## 2023-01-09 MED ORDER — FUROSEMIDE 20 MG PO TABS: 20.0000 mg | ORAL_TABLET | Freq: Every day | ORAL | 3 refills | Status: DC

## 2023-01-09 MED ORDER — ALBUTEROL SULFATE HFA 108 (90 BASE) MCG/ACT IN AERS: 2.0000 | INHALATION_SPRAY | Freq: Four times a day (QID) | RESPIRATORY_TRACT | 5 refills | Status: AC | PRN

## 2023-01-09 MED ORDER — AMLODIPINE BESYLATE 2.5 MG PO TABS: 2.5000 mg | ORAL_TABLET | Freq: Every day | ORAL | 1 refills | Status: DC

## 2023-01-09 MED ORDER — LOSARTAN POTASSIUM 25 MG PO TABS: 25.0000 mg | ORAL_TABLET | Freq: Every day | ORAL | 3 refills | Status: DC

## 2023-01-09 NOTE — Progress Notes (Signed)
[facility] [address] [place], [place] [address]  Internal MEDICINE  Office Visit Note  Patient Name: Daniel Hodges  [id number]  [id number]  Date of Service: 01/09/2023  Chief Complaint  Patient presents with   Diabetes   Hypertension   Hyperlipidemia    HPI Daniel Hodges presents for a follow-up visit for hypertension and cough Hypertension --controlled on current medications  COPD -- on maintenance inhaler and rescue inhaler.  Chronic cough -- chest xray was normal, still coughing all the time and especially at night  Diabetes -- managed by endocrinology.     Current Medication: Outpatient Encounter Medications as of 01/09/2023  Medication Sig   aspirin 81 MG tablet Take 81 mg by mouth daily.   cholecalciferol (VITAMIN D3) 25 MCG (1000 UT) tablet Take 1,000 Units by mouth daily.   cyanocobalamin (VITAMIN B12) 1000 MCG tablet Take 1 tablet (1,000 mcg total) by mouth daily.   feeding supplement, GLUCERNA SHAKE, (GLUCERNA SHAKE) LIQD One a day   ferrous sulfate 324 MG TBEC Take by mouth.   gabapentin (NEURONTIN) 300 MG capsule TAKE 1 CAPSULE BY MOUTH  DAILY AND 1 CAPSULE BY  MOUTH AT NIGHT   HUMALOG KWIKPEN 100 UNIT/ML KwikPen Inject 6-10 Units into the skin See admin instructions. Inject 6u under the skin daily at breakfast-time, 8u at lunch-time and inject 10u under the skin daily at dinner-time   hydrocortisone 1 % ointment Apply small amount mix with lotion at night for leg rash   ipratropium-albuterol (DUONEB) 0.5-2.5 (3) MG/3ML SOLN USE 3 ML VIA NEBULIZER EVERY 6 HOURS AS NEEDED   Magnesium 250 MG TABS Take 250 mg by mouth 2 (two) times daily.   mupirocin ointment (BACTROBAN) 2 % Apply 1 Application topically 2 (two) times daily. To superficial scratches on legs until resolved.   niacin (NIASPAN) 500 MG CR tablet niacin ER 500 mg tablet,extended release 24 hr   senna (SENOKOT) 8.6 MG tablet Take 1 tablet (8.6 mg total) by mouth daily.   simethicone  (MYLICON) 80 MG chewable tablet Chew 2 tablets (160 mg total) by mouth 2 (two) times daily.   Tiotropium Bromide-Olodaterol (STIOLTO RESPIMAT) 2.5-2.5 MCG/ACT AERS Inhale 1 Inhalation into the lungs daily.   TOUJEO SOLOSTAR 300 UNIT/ML Solostar Pen Inject 15 Units into the skin at bedtime.   [DISCONTINUED] albuterol (VENTOLIN HFA) 108 (90 Base) MCG/ACT inhaler Inhale 2 puffs into the lungs every 6 (six) hours as needed for wheezing or shortness of breath.   [DISCONTINUED] amLODipine (NORVASC) 2.5 MG tablet TAKE 1 TABLET BY MOUTH DAILY   [DISCONTINUED] atorvastatin (LIPITOR) 20 MG tablet TAKE 1 TABLET BY MOUTH AT  BEDTIME   [DISCONTINUED] chlorpheniramine-HYDROcodone (TUSSIONEX) 10-8 MG/5ML Take 5 mLs by mouth every 12 (twelve) hours as needed for cough.   [DISCONTINUED] Continuous Glucose Sensor (FREESTYLE LIBRE 2 SENSOR) MISC CHANGE EVERY 14 DAYS   [DISCONTINUED] furosemide (LASIX) 20 MG tablet Take 1 tablet (20 mg total) by mouth daily.   [DISCONTINUED] loratadine (CLARITIN) 10 MG tablet Take 1 tablet (10 mg total) by mouth daily.   [DISCONTINUED] losartan (COZAAR) 25 MG tablet Take 1 tablet (25 mg total) by mouth daily.   [DISCONTINUED] montelukast (SINGULAIR) 10 MG tablet TAKE 1 TABLET BY MOUTH DAILY   albuterol (VENTOLIN HFA) 108 (90 Base) MCG/ACT inhaler Inhale 2 puffs into the lungs every 6 (six) hours as needed for wheezing or shortness of breath.   amLODipine (NORVASC) 2.5 MG tablet Take 1 tablet (2.5 mg total) by mouth daily.  atorvastatin (LIPITOR) 20 MG tablet Take 1 tablet (20 mg total) by mouth at bedtime.   chlorpheniramine-HYDROcodone (TUSSIONEX) 10-8 MG/5ML Take 5 mLs by mouth every 12 (twelve) hours as needed for cough.   Continuous Glucose Sensor (FREESTYLE LIBRE 2 SENSOR) MISC CHANGE EVERY 14 DAYS   furosemide (LASIX) 20 MG tablet Take 1 tablet (20 mg total) by mouth daily.   loratadine (CLARITIN) 10 MG tablet Take 1 tablet (10 mg total) by mouth daily.   losartan (COZAAR) 25  MG tablet Take 1 tablet (25 mg total) by mouth daily.   montelukast (SINGULAIR) 10 MG tablet Take 1 tablet (10 mg total) by mouth daily.   No facility-administered encounter medications on file as of 01/09/2023.    Surgical History: Past Surgical History:  Procedure Laterality Date   APPENDECTOMY     COLONOSCOPY WITH PROPOFOL N/A 06/11/2015   Procedure: COLONOSCOPY WITH PROPOFOL;  Surgeon: Scot Jun, MD;  Location: [facility] ENDOSCOPY;  Service: Endoscopy;  Laterality: N/A;   CORONARY ARTERY BYPASS GRAFT     HERNIA REPAIR     TEE WITHOUT CARDIOVERSION     TRACHEOSTOMY     VASCULAR SURGERY      Medical History: Past Medical History:  Diagnosis Date   Anginal pain (HCC)    Asthma    Coronary artery disease    Diabetes mellitus without complication (HCC)    Hyperlipidemia    Hypertension    Sleep apnea     Family History: Family History  Problem Relation Age of Onset   Cancer Sister    Diabetes Daughter    Diabetes Son     Social History   Socioeconomic History   Marital status: Married    Spouse name: Not on file   Number of children: Not on file   Years of education: Not on file   Highest education level: Not on file  Occupational History   Not on file  Tobacco Use   Smoking status: Never   Smokeless tobacco: Never  Vaping Use   Vaping Use: Never used  Substance and Sexual Activity   Alcohol use: No   Drug use: No   Sexual activity: Not on file  Other Topics Concern   Not on file  Social History Narrative   Not on file   Social Determinants of Health   Financial Resource Strain: Not on file  Food Insecurity: No Food Insecurity (10/31/2022)   Hunger Vital Sign    Worried About Running Out of Food in the Last Year: Never true    Ran Out of Food in the Last Year: Never true  Transportation Needs: No Transportation Needs (10/31/2022)   PRAPARE - Administrator, Civil Service (Medical): No    Lack of Transportation (Non-Medical): No   Physical Activity: Not on file  Stress: Not on file  Social Connections: Not on file  Intimate Partner Violence: Not At Risk (10/31/2022)   Humiliation, Afraid, Rape, and Kick questionnaire    Fear of Current or Ex-Partner: No    Emotionally Abused: No    Physically Abused: No    Sexually Abused: No      Review of Systems  Constitutional:  Negative for fatigue and fever.  HENT:  Positive for postnasal drip and rhinorrhea. Negative for congestion, sinus pressure, sinus pain, sneezing and sore throat.   Respiratory:  Positive for cough. Negative for chest tightness, shortness of breath and wheezing.   Cardiovascular: Negative.  Negative for chest pain and palpitations.  Gastrointestinal: Negative.   Musculoskeletal:  Positive for arthralgias and gait problem.  Neurological:  Negative for headaches.    Vital Signs: BP 109/69   Pulse 74   Temp 98.3 F (36.8 C)   Resp 16   Ht 5\' 6"  (1.676 m)   Wt 162 lb 3.2 oz (73.6 kg)   SpO2 98%   BMI 26.18 kg/m    Physical Exam Vitals reviewed.  Constitutional:      Appearance: Normal appearance.  HENT:     Head: Normocephalic and atraumatic.  Eyes:     Pupils: Pupils are equal, round, and reactive to light.  Cardiovascular:     Rate and Rhythm: Normal rate and regular rhythm.     Heart sounds: Normal heart sounds. No murmur heard. Pulmonary:     Effort: Pulmonary effort is normal. No respiratory distress.     Breath sounds: Normal breath sounds. No wheezing.  Neurological:     Mental Status: He is alert and oriented to person, place, and time.  Psychiatric:        Mood and Affect: Mood normal.        Behavior: Behavior normal.        Assessment/Plan: 1. Essential hypertension Stable, continue furosemide, amlodipine and losartan as prescribed. - furosemide (LASIX) 20 MG tablet; Take 1 tablet (20 mg total) by mouth daily.  Dispense: 90 tablet; Refill: 3 - amLODipine (NORVASC) 2.5 MG tablet; Take 1 tablet (2.5 mg total)  by mouth daily.  Dispense: 90 tablet; Refill: 1 - losartan (COZAAR) 25 MG tablet; Take 1 tablet (25 mg total) by mouth daily.  Dispense: 90 tablet; Refill: 3  2. COPD mixed type [facility]) CT chest ordered for further evaluation. Continue albuterol as prescribed.  - albuterol (VENTOLIN HFA) 108 (90 Base) MCG/ACT inhaler; Inhale 2 puffs into the lungs every 6 (six) hours as needed for wheezing or shortness of breath.  Dispense: 8 g; Refill: 5 - CT Chest Wo Contrast; Future  3. Chronic cough CT chest ordered for further evaluation. Continue albuterol inhaler and cough syrup as prescribed.  - albuterol (VENTOLIN HFA) 108 (90 Base) MCG/ACT inhaler; Inhale 2 puffs into the lungs every 6 (six) hours as needed for wheezing or shortness of breath.  Dispense: 8 g; Refill: 5 - CT Chest Wo Contrast; Future - chlorpheniramine-HYDROcodone (TUSSIONEX) 10-8 MG/5ML; Take 5 mLs by mouth every 12 (twelve) hours as needed for cough.  Dispense: 200 mL; Refill: 0  4. Type 2 diabetes mellitus with hyperglycemia, with long-term current use of insulin (HCC) Reordered freestyle sensors - Continuous Glucose Sensor (FREESTYLE LIBRE 2 SENSOR) MISC; CHANGE EVERY 14 DAYS  Dispense: 2 each; Refill: 5  5. Encounter for medication review Medication list reviewed with patient and his wife, refills ordered - Continuous Glucose Sensor (FREESTYLE LIBRE 2 SENSOR) MISC; CHANGE EVERY 14 DAYS  Dispense: 2 each; Refill: 5 - atorvastatin (LIPITOR) 20 MG tablet; Take 1 tablet (20 mg total) by mouth at bedtime.  Dispense: 90 tablet; Refill: 3 - loratadine (CLARITIN) 10 MG tablet; Take 1 tablet (10 mg total) by mouth daily.  Dispense: 90 tablet; Refill: 3 - montelukast (SINGULAIR) 10 MG tablet; Take 1 tablet (10 mg total) by mouth daily.  Dispense: 100 tablet; Refill: 2   General Counseling: Sade verbalizes understanding of the findings of todays visit and agrees with plan of treatment. I have discussed any further diagnostic  evaluation that may be needed or ordered today. We also reviewed his medications today. he has been encouraged  to call the office with any questions or concerns that should arise related to todays visit.    Orders Placed This Encounter  Procedures   CT Chest Wo Contrast    Meds ordered this encounter  Medications   albuterol (VENTOLIN HFA) 108 (90 Base) MCG/ACT inhaler    Sig: Inhale 2 puffs into the lungs every 6 (six) hours as needed for wheezing or shortness of breath.    Dispense:  8 g    Refill:  5   Continuous Glucose Sensor (FREESTYLE LIBRE 2 SENSOR) MISC    Sig: CHANGE EVERY 14 DAYS    Dispense:  2 each    Refill:  5   furosemide (LASIX) 20 MG tablet    Sig: Take 1 tablet (20 mg total) by mouth daily.    Dispense:  90 tablet    Refill:  3   atorvastatin (LIPITOR) 20 MG tablet    Sig: Take 1 tablet (20 mg total) by mouth at bedtime.    Dispense:  90 tablet    Refill:  3    Requesting 1 year supply   amLODipine (NORVASC) 2.5 MG tablet    Sig: Take 1 tablet (2.5 mg total) by mouth daily.    Dispense:  90 tablet    Refill:  1    **Patient requests 90 days supply**   loratadine (CLARITIN) 10 MG tablet    Sig: Take 1 tablet (10 mg total) by mouth daily.    Dispense:  90 tablet    Refill:  3   losartan (COZAAR) 25 MG tablet    Sig: Take 1 tablet (25 mg total) by mouth daily.    Dispense:  90 tablet    Refill:  3   montelukast (SINGULAIR) 10 MG tablet    Sig: Take 1 tablet (10 mg total) by mouth daily.    Dispense:  100 tablet    Refill:  2    Please send a replace/new response with 100-Day Supply if appropriate to maximize member benefit. Requesting 1 year supply.   chlorpheniramine-HYDROcodone (TUSSIONEX) 10-8 MG/5ML    Sig: Take 5 mLs by mouth every 12 (twelve) hours as needed for cough.    Dispense:  200 mL    Refill:  0    Return for F/U CT chest, Aariya Ferrick PCP. Marland Kitchen   Total time spent:30 Minutes Time spent includes review of chart, medications, test results,  and follow up plan with the patient.   [place] Controlled Substance Database was reviewed by me.  This patient was seen by Sallyanne Kuster, FNP-C in collaboration with Dr. Beverely Risen as a part of collaborative care agreement.   [place] R. Tedd Sias, MSN, FNP-C Internal medicine

## 2023-01-09 NOTE — Telephone Encounter (Signed)
Lvm for patient to return call regarding CT-Toni

## 2023-01-12 ENCOUNTER — Telehealth: Payer: Self-pay | Admitting: Internal Medicine

## 2023-01-12 NOTE — Telephone Encounter (Signed)
Notified wife of CT appointment date, arrival time and location. Patient will call back to schedule follow up-Toni

## 2023-01-19 ENCOUNTER — Ambulatory Visit
Admission: RE | Admit: 2023-01-19 | Discharge: 2023-01-19 | Disposition: A | Discharge status: Home / Self Care | Payer: Medicare Other | Source: Ambulatory Visit | Attending: Nurse Practitioner | Admitting: Nurse Practitioner

## 2023-01-19 DIAGNOSIS — R053 Chronic cough: Secondary | ICD-10-CM | POA: Diagnosis not present

## 2023-01-19 DIAGNOSIS — J449 Chronic obstructive pulmonary disease, unspecified: Secondary | ICD-10-CM | POA: Insufficient documentation

## 2023-01-19 DIAGNOSIS — I700 Atherosclerosis of aorta: Secondary | ICD-10-CM | POA: Diagnosis not present

## 2023-01-29 NOTE — Progress Notes (Signed)
CT scan notes changes suggesting bronchitis. No pneumonia or other abnormalities noted.

## 2023-01-30 ENCOUNTER — Telehealth: Payer: Self-pay

## 2023-01-30 NOTE — Telephone Encounter (Signed)
Patient notified

## 2023-01-30 NOTE — Telephone Encounter (Signed)
-----   Message from Sallyanne Kuster, NP sent at 01/29/2023  9:02 AM EDT ----- CT scan notes changes suggesting bronchitis. No pneumonia or other abnormalities noted.

## 2023-02-24 DIAGNOSIS — Z961 Presence of intraocular lens: Secondary | ICD-10-CM | POA: Diagnosis not present

## 2023-02-24 DIAGNOSIS — E119 Type 2 diabetes mellitus without complications: Secondary | ICD-10-CM | POA: Diagnosis not present

## 2023-02-24 DIAGNOSIS — H5203 Hypermetropia, bilateral: Secondary | ICD-10-CM | POA: Diagnosis not present

## 2023-02-24 DIAGNOSIS — H52221 Regular astigmatism, right eye: Secondary | ICD-10-CM | POA: Diagnosis not present

## 2023-02-24 DIAGNOSIS — H35372 Puckering of macula, left eye: Secondary | ICD-10-CM | POA: Diagnosis not present

## 2023-03-18 ENCOUNTER — Telehealth: Payer: Self-pay | Admitting: Nurse Practitioner

## 2023-03-18 NOTE — Telephone Encounter (Signed)
Left vm to confirm 03/27/2023 appointment-Toni

## 2023-03-27 ENCOUNTER — Encounter: Payer: Self-pay | Admitting: Nurse Practitioner

## 2023-03-27 ENCOUNTER — Ambulatory Visit (INDEPENDENT_AMBULATORY_CARE_PROVIDER_SITE_OTHER): Payer: Medicare Other | Admitting: Nurse Practitioner

## 2023-03-27 VITALS — BP 138/70 | HR 74 | Temp 98.2°F | Resp 16 | Ht 66.0 in | Wt 157.4 lb

## 2023-03-27 DIAGNOSIS — Z794 Long term (current) use of insulin: Secondary | ICD-10-CM

## 2023-03-27 DIAGNOSIS — E1165 Type 2 diabetes mellitus with hyperglycemia: Secondary | ICD-10-CM | POA: Diagnosis not present

## 2023-03-27 DIAGNOSIS — J4540 Moderate persistent asthma, uncomplicated: Secondary | ICD-10-CM

## 2023-03-27 DIAGNOSIS — Z0001 Encounter for general adult medical examination with abnormal findings: Secondary | ICD-10-CM

## 2023-03-27 DIAGNOSIS — E538 Deficiency of other specified B group vitamins: Secondary | ICD-10-CM

## 2023-03-27 DIAGNOSIS — E559 Vitamin D deficiency, unspecified: Secondary | ICD-10-CM

## 2023-03-27 DIAGNOSIS — E782 Mixed hyperlipidemia: Secondary | ICD-10-CM

## 2023-03-27 DIAGNOSIS — J449 Chronic obstructive pulmonary disease, unspecified: Secondary | ICD-10-CM | POA: Diagnosis not present

## 2023-03-27 DIAGNOSIS — D649 Anemia, unspecified: Secondary | ICD-10-CM

## 2023-03-27 LAB — POCT GLYCOSYLATED HEMOGLOBIN (HGB A1C): Hemoglobin A1C: 7.9 % — AB (ref 4.0–5.6)

## 2023-03-27 MED ORDER — TIRZEPATIDE 2.5 MG/0.5ML ~~LOC~~ SOAJ
2.5000 mg | SUBCUTANEOUS | 3 refills | Status: DC
Start: 2023-03-27 — End: 2023-05-01

## 2023-03-27 MED ORDER — TOUJEO SOLOSTAR 300 UNIT/ML ~~LOC~~ SOPN
18.0000 [IU] | PEN_INJECTOR | Freq: Every day | SUBCUTANEOUS | Status: DC
Start: 2023-03-27 — End: 2023-04-24

## 2023-03-27 MED ORDER — IPRATROPIUM-ALBUTEROL 0.5-2.5 (3) MG/3ML IN SOLN
RESPIRATORY_TRACT | 1 refills | Status: AC
Start: 2023-03-27 — End: ?

## 2023-03-27 NOTE — Progress Notes (Deleted)
[facility] [address] [place], [place] [address]  Internal MEDICINE  Office Visit Note  Patient Name: Daniel Hodges  [id number]  [id number]  Date of Service: 03/27/2023  Chief Complaint  Patient presents with   Hypertension   Hyperlipidemia   Medicare Wellness    HPI Daniel Hodges presents for a follow-up visit for  Diabetes --     Current Medication: Outpatient Encounter Medications as of 03/27/2023  Medication Sig   albuterol (VENTOLIN HFA) 108 (90 Base) MCG/ACT inhaler Inhale 2 puffs into the lungs every 6 (six) hours as needed for wheezing or shortness of breath.   amLODipine (NORVASC) 2.5 MG tablet Take 1 tablet (2.5 mg total) by mouth daily.   aspirin 81 MG tablet Take 81 mg by mouth daily.   atorvastatin (LIPITOR) 20 MG tablet Take 1 tablet (20 mg total) by mouth at bedtime.   chlorpheniramine-HYDROcodone (TUSSIONEX) 10-8 MG/5ML Take 5 mLs by mouth every 12 (twelve) hours as needed for cough.   cholecalciferol (VITAMIN D3) 25 MCG (1000 UT) tablet Take 1,000 Units by mouth daily.   Continuous Glucose Sensor (FREESTYLE LIBRE 2 SENSOR) MISC CHANGE EVERY 14 DAYS   cyanocobalamin (VITAMIN B12) 1000 MCG tablet Take 1 tablet (1,000 mcg total) by mouth daily.   feeding supplement, GLUCERNA SHAKE, (GLUCERNA SHAKE) LIQD One a day   ferrous sulfate 324 MG TBEC Take by mouth.   furosemide (LASIX) 20 MG tablet Take 1 tablet (20 mg total) by mouth daily.   gabapentin (NEURONTIN) 300 MG capsule TAKE 1 CAPSULE BY MOUTH  DAILY AND 1 CAPSULE BY  MOUTH AT NIGHT   HUMALOG KWIKPEN 100 UNIT/ML KwikPen Inject 5 Units into the skin 2 (two) times daily before a meal.   hydrocortisone 1 % ointment Apply small amount mix with lotion at night for leg rash   ipratropium-albuterol (DUONEB) 0.5-2.5 (3) MG/3ML SOLN USE 3 ML VIA NEBULIZER EVERY 6 HOURS AS NEEDED   loratadine (CLARITIN) 10 MG tablet Take 1 tablet (10 mg total) by mouth daily.   losartan (COZAAR) 25 MG tablet Take 1  tablet (25 mg total) by mouth daily.   Magnesium 250 MG TABS Take 250 mg by mouth 2 (two) times daily.   montelukast (SINGULAIR) 10 MG tablet Take 1 tablet (10 mg total) by mouth daily.   mupirocin ointment (BACTROBAN) 2 % Apply 1 Application topically 2 (two) times daily. To superficial scratches on legs until resolved.   niacin (NIASPAN) 500 MG CR tablet niacin ER 500 mg tablet,extended release 24 hr   senna (SENOKOT) 8.6 MG tablet Take 1 tablet (8.6 mg total) by mouth daily.   simethicone (MYLICON) 80 MG chewable tablet Chew 2 tablets (160 mg total) by mouth 2 (two) times daily.   Tiotropium Bromide-Olodaterol (STIOLTO RESPIMAT) 2.5-2.5 MCG/ACT AERS Inhale 1 Inhalation into the lungs daily.   tirzepatide [facility]) 2.5 MG/0.5ML Pen Inject 2.5 mg into the skin once a week.   [DISCONTINUED] TOUJEO SOLOSTAR 300 UNIT/ML Solostar Pen Inject 15 Units into the skin at bedtime.   TOUJEO SOLOSTAR 300 UNIT/ML Solostar Pen Inject 18 Units into the skin at bedtime.   [DISCONTINUED] albuterol (VENTOLIN HFA) 108 (90 Base) MCG/ACT inhaler Inhale 2 puffs into the lungs every 6 (six) hours as needed for wheezing or shortness of breath.   [DISCONTINUED] amLODipine (NORVASC) 2.5 MG tablet Take 1 tablet (2.5 mg total) by mouth daily.   [DISCONTINUED] atorvastatin (LIPITOR) 20 MG tablet TAKE 1 TABLET BY MOUTH AT  BEDTIME   [DISCONTINUED]  azithromycin (ZITHROMAX) 250 MG tablet Take one tab a day for 10 days for uri   [DISCONTINUED] benzonatate (TESSALON) 200 MG capsule Take 1 capsule (200 mg total) by mouth 2 (two) times daily as needed for cough.   [DISCONTINUED] chlorpheniramine-HYDROcodone (TUSSIONEX) 10-8 MG/5ML Take 5 mLs by mouth every 12 (twelve) hours as needed for cough.   [DISCONTINUED] Continuous Blood Gluc Receiver (FREESTYLE LIBRE 2 READER) DEVI USE AS DIRECTED   [DISCONTINUED] Continuous Blood Gluc Sensor (FREESTYLE LIBRE 2 SENSOR) MISC 1 Device by Does not apply route every 14 (fourteen) days.    [DISCONTINUED] furosemide (LASIX) 20 MG tablet Take 1 tablet (20 mg total) by mouth daily.   [DISCONTINUED] gabapentin (NEURONTIN) 300 MG capsule TAKE 1 CAPSULE BY MOUTH  DAILY AND 1 CAPSULE BY  MOUTH AT NIGHT   [DISCONTINUED] glucose blood test strip 1 each by Other route in the morning, at noon, in the evening, and at bedtime. Use as instructed   [DISCONTINUED] ipratropium-albuterol (DUONEB) 0.5-2.5 (3) MG/3ML SOLN USE 3 ML VIA NEBULIZER EVERY 6 HOURS AS NEEDED   [DISCONTINUED] loratadine (CLARITIN) 10 MG tablet Take 1 tablet (10 mg total) by mouth daily.   [DISCONTINUED] losartan (COZAAR) 25 MG tablet Take 1 tablet (25 mg total) by mouth daily.   [DISCONTINUED] montelukast (SINGULAIR) 10 MG tablet TAKE 1 TABLET BY MOUTH DAILY   [DISCONTINUED] Tiotropium Bromide-Olodaterol (STIOLTO RESPIMAT) 2.5-2.5 MCG/ACT AERS Inhale 1 Inhalation into the lungs daily.   [DISCONTINUED] TOUJEO SOLOSTAR 300 UNIT/ML SOPN Inject 25 Units into the skin at bedtime.    [DISCONTINUED] vitamin B-12 (CYANOCOBALAMIN) 1000 MCG tablet Take 1,000 mcg by mouth daily.   No facility-administered encounter medications on file as of 03/27/2023.    Surgical History: Past Surgical History:  Procedure Laterality Date   APPENDECTOMY     COLONOSCOPY WITH PROPOFOL N/A 06/11/2015   Procedure: COLONOSCOPY WITH PROPOFOL;  Surgeon: Scot Jun, MD;  Location: [facility] ENDOSCOPY;  Service: Endoscopy;  Laterality: N/A;   CORONARY ARTERY BYPASS GRAFT     HERNIA REPAIR     TEE WITHOUT CARDIOVERSION     TRACHEOSTOMY     VASCULAR SURGERY      Medical History: Past Medical History:  Diagnosis Date   Anginal pain (HCC)    Asthma    Coronary artery disease    Diabetes mellitus without complication (HCC)    Hyperlipidemia    Hypertension    Sleep apnea     Family History: Family History  Problem Relation Age of Onset   Cancer Sister    Diabetes Daughter    Diabetes Son     Social History   Socioeconomic History    Marital status: Married    Spouse name: Not on file   Number of children: Not on file   Years of education: Not on file   Highest education level: Not on file  Occupational History   Not on file  Tobacco Use   Smoking status: Never   Smokeless tobacco: Never  Vaping Use   Vaping status: Never Used  Substance and Sexual Activity   Alcohol use: No   Drug use: No   Sexual activity: Not on file  Other Topics Concern   Not on file  Social History Narrative   Not on file   Social Determinants of Health   Financial Resource Strain: Not on file  Food Insecurity: No Food Insecurity (10/31/2022)   Hunger Vital Sign    Worried About Running Out of Food in the Last  Year: Never true    Ran Out of Food in the Last Year: Never true  Transportation Needs: No Transportation Needs (10/31/2022)   PRAPARE - Administrator, Civil Service (Medical): No    Lack of Transportation (Non-Medical): No  Physical Activity: Not on file  Stress: Not on file  Social Connections: Not on file  Intimate Partner Violence: Not At Risk (10/31/2022)   Humiliation, Afraid, Rape, and Kick questionnaire    Fear of Current or Ex-Partner: No    Emotionally Abused: No    Physically Abused: No    Sexually Abused: No      Review of Systems  Vital Signs: BP 138/70   Pulse 74   Temp 98.2 F (36.8 C)   Resp 16   Ht 5\' 6"  (1.676 m)   Wt 157 lb 6.4 oz (71.4 kg)   SpO2 97%   BMI 25.41 kg/m    Physical Exam     Assessment/Plan:   General Counseling: Angeles verbalizes understanding of the findings of todays visit and agrees with plan of treatment. I have discussed any further diagnostic evaluation that may be needed or ordered today. We also reviewed his medications today. he has been encouraged to call the office with any questions or concerns that should arise related to todays visit.    Orders Placed This Encounter  Procedures   CBC with Differential/Platelet   CMP14+EGFR   Lipid  Profile   Vitamin D (25 hydroxy)   B12 and Folate Panel   Magnesium   POCT glycosylated hemoglobin (Hb A1C)    Meds ordered this encounter  Medications   tirzepatide (MOUNJARO) 2.5 MG/0.5ML Pen    Sig: Inject 2.5 mg into the skin once a week.    Dispense:  2 mL    Refill:  3    Dx code E11.65   TOUJEO SOLOSTAR 300 UNIT/ML Solostar Pen    Sig: Inject 18 Units into the skin at bedtime.    Return in about 4 weeks (around 04/24/2023) for F/U, Nikko Goldwire PCP, eval new med sugars and lab results. .   Total time spent:*** Minutes Time spent includes review of chart, medications, test results, and follow up plan with the patient.   [place] Controlled Substance Database was reviewed by me.  This patient was seen by Sallyanne Kuster, FNP-C in collaboration with Dr. Beverely Risen as a part of collaborative care agreement.   Zeeva Courser R. Tedd Sias, MSN, FNP-C Internal medicine

## 2023-03-30 ENCOUNTER — Encounter: Payer: Self-pay | Admitting: Nurse Practitioner

## 2023-03-30 NOTE — Progress Notes (Signed)
[facility] [address] [place], [place] [address]  Internal MEDICINE  Office Visit Note  Patient Name: Daniel Hodges  [id number]  [id number]  Date of Service: 03/27/2023  Chief Complaint  Patient presents with   Hypertension   Hyperlipidemia   Medicare Wellness    HPI Daniel Hodges presents for an annual well visit and physical exam. Daughter and granddaughter are present for the office visit today.  Well-appearing 82 y.o. male with diabetes, OSA, chronic obstructive bronchitis, hypertension, CAD, allergic rhinitis, and high cholesterol. Recent history of pneumonia with severe sepsis resulting in hospital stay. Eye exam-- overdue foot exam: done today Labs:  due for routine labs  New or worsening pain: none  Other concerns: his endocrinologist is retiring, I am taking over management of his diabetes as his PCP.  Currently on toujeo insulin 18 units daily and humalog 7 units twice daily with meals. A1c slightly improved to 7.9 as of today from 8.6 at previous A1c.      03/27/2023   10:32 AM 03/21/2022    8:57 AM 03/19/2021   10:22 AM  MMSE - Mini Mental State Exam  Orientation to time 5 5 5   Orientation to Place 5 5 5   Registration 3 3 3   Attention/ Calculation 5 5 5   Recall 3 3 3   Language- name 2 objects 2 2 2   Language- repeat 1 1 0  Language- follow 3 step command 3 3 3   Language- read & follow direction 1 1 1   Write a sentence 1 1 0  Copy design 1 1 1   Total score 30 30 28     Functional Status Survey: Is the patient deaf or have difficulty hearing?: Yes Does the patient have difficulty seeing, even when wearing glasses/contacts?: No Does the patient have difficulty concentrating, remembering, or making decisions?: Yes Does the patient have difficulty walking or climbing stairs?: Yes Does the patient have difficulty dressing or bathing?: No Does the patient have difficulty doing errands alone such as visiting a doctor's office or shopping?:  Yes     09/19/2021   10:42 AM 12/18/2021   10:59 AM 03/21/2022    8:57 AM 10/10/2022    9:24 AM 03/27/2023   10:30 AM  Fall Risk  Falls in the past year? 0 0 0 0 0  Was there an injury with Fall?    0 0  Fall Risk Category Calculator    0 0  (RETIRED) Patient Fall Risk Level Low fall risk  Low fall risk    Patient at Risk for Falls Due to No Fall Risks  No Fall Risks No Fall Risks No Fall Risks  Fall risk Follow up Falls evaluation completed  Falls evaluation completed Falls evaluation completed Falls evaluation completed       03/27/2023   10:30 AM  Depression screen PHQ 2/9  Decreased Interest 0  Down, Depressed, Hopeless 0  PHQ - 2 Score 0        Current Medication: Outpatient Encounter Medications as of 03/27/2023  Medication Sig   albuterol (VENTOLIN HFA) 108 (90 Base) MCG/ACT inhaler Inhale 2 puffs into the lungs every 6 (six) hours as needed for wheezing or shortness of breath.   amLODipine (NORVASC) 2.5 MG tablet Take 1 tablet (2.5 mg total) by mouth daily.   aspirin 81 MG tablet Take 81 mg by mouth daily.   atorvastatin (LIPITOR) 20 MG tablet Take 1 tablet (20 mg total) by mouth at bedtime.   chlorpheniramine-HYDROcodone (TUSSIONEX) 10-8 MG/5ML  Take 5 mLs by mouth every 12 (twelve) hours as needed for cough.   cholecalciferol (VITAMIN D3) 25 MCG (1000 UT) tablet Take 1,000 Units by mouth daily.   Continuous Glucose Sensor (FREESTYLE LIBRE 2 SENSOR) MISC CHANGE EVERY 14 DAYS   cyanocobalamin (VITAMIN B12) 1000 MCG tablet Take 1 tablet (1,000 mcg total) by mouth daily.   feeding supplement, GLUCERNA SHAKE, (GLUCERNA SHAKE) LIQD One a day   ferrous sulfate 324 MG TBEC Take by mouth.   furosemide (LASIX) 20 MG tablet Take 1 tablet (20 mg total) by mouth daily.   gabapentin (NEURONTIN) 300 MG capsule TAKE 1 CAPSULE BY MOUTH  DAILY AND 1 CAPSULE BY  MOUTH AT NIGHT   HUMALOG KWIKPEN 100 UNIT/ML KwikPen Inject 5 Units into the skin 2 (two) times daily before a meal.    hydrocortisone 1 % ointment Apply small amount mix with lotion at night for leg rash   loratadine (CLARITIN) 10 MG tablet Take 1 tablet (10 mg total) by mouth daily.   losartan (COZAAR) 25 MG tablet Take 1 tablet (25 mg total) by mouth daily.   Magnesium 250 MG TABS Take 250 mg by mouth 2 (two) times daily.   montelukast (SINGULAIR) 10 MG tablet Take 1 tablet (10 mg total) by mouth daily.   mupirocin ointment (BACTROBAN) 2 % Apply 1 Application topically 2 (two) times daily. To superficial scratches on legs until resolved.   niacin (NIASPAN) 500 MG CR tablet niacin ER 500 mg tablet,extended release 24 hr   senna (SENOKOT) 8.6 MG tablet Take 1 tablet (8.6 mg total) by mouth daily.   simethicone (MYLICON) 80 MG chewable tablet Chew 2 tablets (160 mg total) by mouth 2 (two) times daily.   Tiotropium Bromide-Olodaterol (STIOLTO RESPIMAT) 2.5-2.5 MCG/ACT AERS Inhale 1 Inhalation into the lungs daily.   tirzepatide [place]) 2.5 MG/0.5ML Pen Inject 2.5 mg into the skin once a week.   [DISCONTINUED] ipratropium-albuterol (DUONEB) 0.5-2.5 (3) MG/3ML SOLN USE 3 ML VIA NEBULIZER EVERY 6 HOURS AS NEEDED   [DISCONTINUED] TOUJEO SOLOSTAR 300 UNIT/ML Solostar Pen Inject 15 Units into the skin at bedtime.   ipratropium-albuterol (DUONEB) 0.5-2.5 (3) MG/3ML SOLN USE 3 ML VIA NEBULIZER EVERY 6 HOURS AS NEEDED   TOUJEO SOLOSTAR 300 UNIT/ML Solostar Pen Inject 18 Units into the skin at bedtime.   [DISCONTINUED] albuterol (VENTOLIN HFA) 108 (90 Base) MCG/ACT inhaler Inhale 2 puffs into the lungs every 6 (six) hours as needed for wheezing or shortness of breath.   [DISCONTINUED] amLODipine (NORVASC) 2.5 MG tablet Take 1 tablet (2.5 mg total) by mouth daily.   [DISCONTINUED] atorvastatin (LIPITOR) 20 MG tablet TAKE 1 TABLET BY MOUTH AT  BEDTIME   [DISCONTINUED] azithromycin (ZITHROMAX) 250 MG tablet Take one tab a day for 10 days for uri   [DISCONTINUED] benzonatate (TESSALON) 200 MG capsule Take 1 capsule (200 mg  total) by mouth 2 (two) times daily as needed for cough.   [DISCONTINUED] chlorpheniramine-HYDROcodone (TUSSIONEX) 10-8 MG/5ML Take 5 mLs by mouth every 12 (twelve) hours as needed for cough.   [DISCONTINUED] Continuous Blood Gluc Receiver (FREESTYLE LIBRE 2 READER) DEVI USE AS DIRECTED   [DISCONTINUED] Continuous Blood Gluc Sensor (FREESTYLE LIBRE 2 SENSOR) MISC 1 Device by Does not apply route every 14 (fourteen) days.   [DISCONTINUED] furosemide (LASIX) 20 MG tablet Take 1 tablet (20 mg total) by mouth daily.   [DISCONTINUED] gabapentin (NEURONTIN) 300 MG capsule TAKE 1 CAPSULE BY MOUTH  DAILY AND 1 CAPSULE BY  MOUTH AT NIGHT   [  DISCONTINUED] glucose blood test strip 1 each by Other route in the morning, at noon, in the evening, and at bedtime. Use as instructed   [DISCONTINUED] ipratropium-albuterol (DUONEB) 0.5-2.5 (3) MG/3ML SOLN USE 3 ML VIA NEBULIZER EVERY 6 HOURS AS NEEDED   [DISCONTINUED] loratadine (CLARITIN) 10 MG tablet Take 1 tablet (10 mg total) by mouth daily.   [DISCONTINUED] losartan (COZAAR) 25 MG tablet Take 1 tablet (25 mg total) by mouth daily.   [DISCONTINUED] montelukast (SINGULAIR) 10 MG tablet TAKE 1 TABLET BY MOUTH DAILY   [DISCONTINUED] Tiotropium Bromide-Olodaterol (STIOLTO RESPIMAT) 2.5-2.5 MCG/ACT AERS Inhale 1 Inhalation into the lungs daily.   [DISCONTINUED] TOUJEO SOLOSTAR 300 UNIT/ML SOPN Inject 25 Units into the skin at bedtime.    [DISCONTINUED] vitamin B-12 (CYANOCOBALAMIN) 1000 MCG tablet Take 1,000 mcg by mouth daily.   No facility-administered encounter medications on file as of 03/27/2023.    Surgical History: Past Surgical History:  Procedure Laterality Date   APPENDECTOMY     COLONOSCOPY WITH PROPOFOL N/A 06/11/2015   Procedure: COLONOSCOPY WITH PROPOFOL;  Surgeon: Scot Jun, MD;  Location: [facility] ENDOSCOPY;  Service: Endoscopy;  Laterality: N/A;   CORONARY ARTERY BYPASS GRAFT     HERNIA REPAIR     TEE WITHOUT CARDIOVERSION     TRACHEOSTOMY      VASCULAR SURGERY      Medical History: Past Medical History:  Diagnosis Date   Anginal pain (HCC)    Asthma    Coronary artery disease    Diabetes mellitus without complication (HCC)    Hyperlipidemia    Hypertension    Sleep apnea     Family History: Family History  Problem Relation Age of Onset   Cancer Sister    Diabetes Daughter    Diabetes Son     Social History   Socioeconomic History   Marital status: Married    Spouse name: Not on file   Number of children: Not on file   Years of education: Not on file   Highest education level: Not on file  Occupational History   Not on file  Tobacco Use   Smoking status: Never   Smokeless tobacco: Never  Vaping Use   Vaping status: Never Used  Substance and Sexual Activity   Alcohol use: No   Drug use: No   Sexual activity: Not on file  Other Topics Concern   Not on file  Social History Narrative   Not on file   Social Determinants of Health   Financial Resource Strain: Not on file  Food Insecurity: No Food Insecurity (10/31/2022)   Hunger Vital Sign    Worried About Running Out of Food in the Last Year: Never true    Ran Out of Food in the Last Year: Never true  Transportation Needs: No Transportation Needs (10/31/2022)   PRAPARE - Administrator, Civil Service (Medical): No    Lack of Transportation (Non-Medical): No  Physical Activity: Not on file  Stress: Not on file  Social Connections: Not on file  Intimate Partner Violence: Not At Risk (10/31/2022)   Humiliation, Afraid, Rape, and Kick questionnaire    Fear of Current or Ex-Partner: No    Emotionally Abused: No    Physically Abused: No    Sexually Abused: No      Review of Systems  Constitutional:  Negative for activity change, appetite change, chills, fatigue, fever and unexpected weight change.  HENT: Negative.  Negative for congestion, ear pain, rhinorrhea, sore throat  and trouble swallowing.   Eyes: Negative.   Respiratory:   Positive for cough (chronic cough due to COPD/asthma). Negative for chest tightness, shortness of breath and wheezing.   Cardiovascular: Negative.  Negative for chest pain.  Gastrointestinal: Negative.  Negative for abdominal pain, blood in stool, constipation, diarrhea, nausea and vomiting.  Endocrine: Negative.   Genitourinary: Negative.  Negative for difficulty urinating, dysuria, frequency, hematuria and urgency.  Musculoskeletal: Negative.  Negative for arthralgias, back pain, joint swelling, myalgias and neck pain.  Skin: Negative.  Negative for rash and wound.  Allergic/Immunologic: Negative.  Negative for immunocompromised state.  Neurological: Negative.  Negative for dizziness, seizures, numbness and headaches.  Hematological: Negative.   Psychiatric/Behavioral: Negative.  Negative for behavioral problems, self-injury and suicidal ideas. The patient is not nervous/anxious.     Vital Signs: BP 138/70   Pulse 74   Temp 98.2 F (36.8 C)   Resp 16   Ht 5\' 6"  (1.676 m)   Wt 157 lb 6.4 oz (71.4 kg)   SpO2 97%   BMI 25.41 kg/m    Physical Exam Vitals reviewed.  Constitutional:      General: He is awake. He is not in acute distress.    Appearance: Normal appearance. He is well-developed, well-groomed and overweight. He is not ill-appearing or diaphoretic.  HENT:     Head: Normocephalic and atraumatic.     Right Ear: Tympanic membrane, ear canal and external ear normal. There is no impacted cerumen.     Left Ear: Tympanic membrane, ear canal and external ear normal. There is no impacted cerumen.     Nose: Nose normal. No congestion or rhinorrhea.     Mouth/Throat:     Lips: Pink.     Mouth: Mucous membranes are moist.     Pharynx: Oropharynx is clear. Uvula midline. No oropharyngeal exudate or posterior oropharyngeal erythema.  Eyes:     General: Lids are normal. Vision grossly intact. Gaze aligned appropriately. No scleral icterus.       Right eye: No discharge.         Left eye: No discharge.     Extraocular Movements: Extraocular movements intact.     Conjunctiva/sclera: Conjunctivae normal.     Pupils: Pupils are equal, round, and reactive to light.     Funduscopic exam:    Right eye: Red reflex present.        Left eye: Red reflex present. Neck:     Thyroid: No thyromegaly.     Vascular: No JVD.     Trachea: Trachea and phonation normal. No tracheal deviation.  Cardiovascular:     Rate and Rhythm: Normal rate and regular rhythm.     Pulses: Normal pulses.          Dorsalis pedis pulses are 2+ on the right side and 2+ on the left side.       Posterior tibial pulses are 2+ on the right side and 2+ on the left side.     Heart sounds: Normal heart sounds, S1 normal and S2 normal. No murmur heard.    No friction rub. No gallop.  Pulmonary:     Effort: Pulmonary effort is normal. No accessory muscle usage or respiratory distress.     Breath sounds: Normal breath sounds and air entry. No stridor. No wheezing or rales.  Chest:     Chest wall: No tenderness.  Abdominal:     General: Bowel sounds are normal. There is no distension.  Palpations: Abdomen is soft. There is no mass.     Tenderness: There is no abdominal tenderness. There is no guarding or rebound.  Musculoskeletal:        General: No tenderness or deformity. Normal range of motion.     Cervical back: Normal range of motion and neck supple.     Right lower leg: No edema.     Left lower leg: No edema.     Right foot: Normal range of motion. No deformity, bunion, Charcot foot, foot drop or prominent metatarsal heads.     Left foot: Normal range of motion. No deformity, bunion, Charcot foot, foot drop or prominent metatarsal heads.  Feet:     Right foot:     Protective Sensation: 6 sites tested.  6 sites sensed.     Skin integrity: Dry skin present. No ulcer, blister, skin breakdown, erythema, warmth, callus or fissure.     Toenail Condition: Right toenails are abnormally thick and long.      Left foot:     Protective Sensation: 6 sites tested.  6 sites sensed.     Skin integrity: Dry skin present. No ulcer, blister, skin breakdown, erythema, warmth, callus or fissure.     Toenail Condition: Left toenails are abnormally thick and long.  Lymphadenopathy:     Cervical: No cervical adenopathy.  Skin:    General: Skin is warm and dry.     Capillary Refill: Capillary refill takes less than 2 seconds.     Coloration: Skin is not pale.     Findings: No erythema or rash.  Neurological:     Mental Status: He is alert and oriented to person, place, and time.     Cranial Nerves: No cranial nerve deficit.     Motor: No abnormal muscle tone.     Coordination: Coordination normal.     Gait: Gait normal.     Deep Tendon Reflexes: Reflexes are normal and symmetric.  Psychiatric:        Mood and Affect: Mood and affect normal.        Behavior: Behavior normal. Behavior is cooperative.        Thought Content: Thought content normal.        Judgment: Judgment normal.        Assessment/Plan: 1. Encounter for routine adult health examination with abnormal findings Age-appropriate preventive screenings and vaccinations discussed, annual physical exam completed. Routine labs for health maintenance ordered, see below. PHM updated.  - CBC with Differential/Platelet - CMP14+EGFR - Lipid Profile - Vitamin D (25 hydroxy) - B12 and Folate Panel - Magnesium  2. Type 2 diabetes mellitus with hyperglycemia, with long-term current use of insulin (HCC) A1c improving  Start initial dose of mounjaro at 2.5 mg x4 weeks Decrease humalog dose to 5 units twice daily with meals  No change in toujeo, continue at 18 units daily.  Routine labs ordered and will follow up in 4 weeks to review labs and evaluate glucose levels and tolerance of new medication.  - POCT glycosylated hemoglobin (Hb A1C) - tirzepatide (MOUNJARO) 2.5 MG/0.5ML Pen; Inject 2.5 mg into the skin once a week.  Dispense: 2 mL;  Refill: 3 - TOUJEO SOLOSTAR 300 UNIT/ML Solostar Pen; Inject 18 Units into the skin at bedtime. - CBC with Differential/Platelet - CMP14+EGFR - Lipid Profile - Vitamin D (25 hydroxy) - B12 and Folate Panel - Magnesium  3. Vitamin D deficiency Routine labs ordered  - Vitamin D (25 hydroxy)  4. Mixed hyperlipidemia  Routine labs ordered  - CBC with Differential/Platelet - CMP14+EGFR - Lipid Profile  5. B12 deficiency Routine labs ordered  - CBC with Differential/Platelet - B12 and Folate Panel - Magnesium  6. Hypomagnesemia Routine lab ordered  - Magnesium  7. COPD mixed type (HCC) Stable, chronic cough, continue prn neb treatments - ipratropium-albuterol (DUONEB) 0.5-2.5 (3) MG/3ML SOLN; USE 3 ML VIA NEBULIZER EVERY 6 HOURS AS NEEDED  Dispense: 1080 mL; Refill: 1      General Counseling: Sava verbalizes understanding of the findings of todays visit and agrees with plan of treatment. I have discussed any further diagnostic evaluation that may be needed or ordered today. We also reviewed his medications today. he has been encouraged to call the office with any questions or concerns that should arise related to todays visit.    Orders Placed This Encounter  Procedures   CBC with Differential/Platelet   CMP14+EGFR   Lipid Profile   Vitamin D (25 hydroxy)   B12 and Folate Panel   Magnesium   POCT glycosylated hemoglobin (Hb A1C)    Meds ordered this encounter  Medications   tirzepatide (MOUNJARO) 2.5 MG/0.5ML Pen    Sig: Inject 2.5 mg into the skin once a week.    Dispense:  2 mL    Refill:  3    Dx code E11.65   TOUJEO SOLOSTAR 300 UNIT/ML Solostar Pen    Sig: Inject 18 Units into the skin at bedtime.   ipratropium-albuterol (DUONEB) 0.5-2.5 (3) MG/3ML SOLN    Sig: USE 3 ML VIA NEBULIZER EVERY 6 HOURS AS NEEDED    Dispense:  1080 mL    Refill:  1    For future refills    Return in about 4 weeks (around 04/24/2023) for F/U, Andie Mortimer PCP, eval new med  sugars and lab results. .   Total time spent:30 Minutes Time spent includes review of chart, medications, test results, and follow up plan with the patient.   [place] Controlled Substance Database was reviewed by me.  This patient was seen by Sallyanne Kuster, FNP-C in collaboration with Dr. Beverely Risen as a part of collaborative care agreement.  Sheilah Rayos R. Tedd Sias, MSN, FNP-C Internal medicine

## 2023-04-02 ENCOUNTER — Other Ambulatory Visit: Payer: Self-pay | Admitting: Nurse Practitioner

## 2023-04-02 DIAGNOSIS — Z79899 Other long term (current) drug therapy: Secondary | ICD-10-CM

## 2023-04-06 ENCOUNTER — Other Ambulatory Visit: Payer: Self-pay

## 2023-04-06 DIAGNOSIS — Z794 Long term (current) use of insulin: Secondary | ICD-10-CM | POA: Diagnosis not present

## 2023-04-06 DIAGNOSIS — E1165 Type 2 diabetes mellitus with hyperglycemia: Secondary | ICD-10-CM | POA: Diagnosis not present

## 2023-04-06 DIAGNOSIS — E782 Mixed hyperlipidemia: Secondary | ICD-10-CM | POA: Diagnosis not present

## 2023-04-06 DIAGNOSIS — Z0001 Encounter for general adult medical examination with abnormal findings: Secondary | ICD-10-CM | POA: Diagnosis not present

## 2023-04-06 DIAGNOSIS — E559 Vitamin D deficiency, unspecified: Secondary | ICD-10-CM | POA: Diagnosis not present

## 2023-04-06 MED ORDER — HUMALOG KWIKPEN 100 UNIT/ML ~~LOC~~ SOPN: 5.0000 [IU] | PEN_INJECTOR | Freq: Two times a day (BID) | SUBCUTANEOUS | 2 refills | Status: DC

## 2023-04-07 LAB — B12 AND FOLATE PANEL
Folate: 9.3 ng/mL (ref >3.0)
Vitamin B-12: 1167 pg/mL (ref 232–1245)

## 2023-04-07 LAB — CBC WITH DIFFERENTIAL/PLATELET
Basophils Absolute: 0.1 10*3/uL (ref 0.0–0.2)
Basos: 1 %
EOS (ABSOLUTE): 0.1 10*3/uL (ref 0.0–0.4)
Eos: 2 %
Hematocrit: 36.7 % — ABNORMAL LOW (ref 37.5–51.0)
Hemoglobin: 11.9 g/dL — ABNORMAL LOW (ref 13.0–17.7)
Immature Grans (Abs): 0 10*3/uL (ref 0.0–0.1)
Immature Granulocytes: 0 %
Lymphocytes Absolute: 0.9 10*3/uL (ref 0.7–3.1)
Lymphs: 17 %
MCH: 27.7 pg (ref 26.6–33.0)
MCHC: 32.4 g/dL (ref 31.5–35.7)
MCV: 86 fL (ref 79–97)
Monocytes Absolute: 0.7 10*3/uL (ref 0.1–0.9)
Monocytes: 14 %
Neutrophils Absolute: 3.5 10*3/uL (ref 1.4–7.0)
Neutrophils: 66 %
Platelets: 204 10*3/uL (ref 150–450)
RBC: 4.29 x10E6/uL (ref 4.14–5.80)
RDW: 12.4 % (ref 11.6–15.4)
WBC: 5.3 10*3/uL (ref 3.4–10.8)

## 2023-04-07 LAB — CMP14+EGFR
ALT: 14 IU/L (ref 0–44)
AST: 16 IU/L (ref 0–40)
Albumin: 4.3 g/dL (ref 3.7–4.7)
Alkaline Phosphatase: 136 IU/L — ABNORMAL HIGH (ref 44–121)
BUN/Creatinine Ratio: 15 (ref 10–24)
BUN: 19 mg/dL (ref 8–27)
Bilirubin Total: 0.5 mg/dL (ref 0.0–1.2)
CO2: 24 mmol/L (ref 20–29)
Calcium: 9.2 mg/dL (ref 8.6–10.2)
Chloride: 96 mmol/L (ref 96–106)
Creatinine, Ser: 1.29 mg/dL — ABNORMAL HIGH (ref 0.76–1.27)
Globulin, Total: 2.4 g/dL (ref 1.5–4.5)
Glucose: 162 mg/dL — ABNORMAL HIGH (ref 70–99)
Potassium: 5.4 mmol/L — ABNORMAL HIGH (ref 3.5–5.2)
Sodium: 132 mmol/L — ABNORMAL LOW (ref 134–144)
Total Protein: 6.7 g/dL (ref 6.0–8.5)
eGFR: 55 mL/min/{1.73_m2} — ABNORMAL LOW (ref >59)

## 2023-04-07 LAB — VITAMIN D 25 HYDROXY (VIT D DEFICIENCY, FRACTURES): Vit D, 25-Hydroxy: 35.3 ng/mL (ref 30.0–100.0)

## 2023-04-07 LAB — LIPID PANEL
Chol/HDL Ratio: 1.7 ratio (ref 0.0–5.0)
Cholesterol, Total: 110 mg/dL (ref 100–199)
HDL: 64 mg/dL (ref >39)
LDL Chol Calc (NIH): 36 mg/dL (ref 0–99)
Triglycerides: 37 mg/dL (ref 0–149)
VLDL Cholesterol Cal: 10 mg/dL (ref 5–40)

## 2023-04-07 LAB — MAGNESIUM: Magnesium: 1.9 mg/dL (ref 1.6–2.3)

## 2023-04-17 ENCOUNTER — Other Ambulatory Visit: Payer: Self-pay | Admitting: Nurse Practitioner

## 2023-04-17 DIAGNOSIS — I10 Essential (primary) hypertension: Secondary | ICD-10-CM

## 2023-04-24 ENCOUNTER — Ambulatory Visit (INDEPENDENT_AMBULATORY_CARE_PROVIDER_SITE_OTHER): Payer: Medicare Other | Admitting: Nurse Practitioner

## 2023-04-24 ENCOUNTER — Encounter: Payer: Self-pay | Admitting: Nurse Practitioner

## 2023-04-24 VITALS — BP 134/70 | HR 87 | Temp 98.2°F | Resp 16 | Ht 66.0 in | Wt 158.0 lb

## 2023-04-24 DIAGNOSIS — I10 Essential (primary) hypertension: Secondary | ICD-10-CM | POA: Diagnosis not present

## 2023-04-24 DIAGNOSIS — B351 Tinea unguium: Secondary | ICD-10-CM | POA: Diagnosis not present

## 2023-04-24 DIAGNOSIS — E1142 Type 2 diabetes mellitus with diabetic polyneuropathy: Secondary | ICD-10-CM | POA: Diagnosis not present

## 2023-04-24 DIAGNOSIS — Z794 Long term (current) use of insulin: Secondary | ICD-10-CM

## 2023-04-24 DIAGNOSIS — N1831 Chronic kidney disease, stage 3a: Secondary | ICD-10-CM

## 2023-04-24 DIAGNOSIS — E1169 Type 2 diabetes mellitus with other specified complication: Secondary | ICD-10-CM

## 2023-04-24 DIAGNOSIS — E1165 Type 2 diabetes mellitus with hyperglycemia: Secondary | ICD-10-CM | POA: Diagnosis not present

## 2023-04-24 DIAGNOSIS — Z79899 Other long term (current) drug therapy: Secondary | ICD-10-CM | POA: Diagnosis not present

## 2023-04-24 MED ORDER — AMLODIPINE BESYLATE 2.5 MG PO TABS
2.5000 mg | ORAL_TABLET | Freq: Every day | ORAL | 1 refills | Status: DC
Start: 2023-04-24 — End: 2023-07-02

## 2023-04-24 MED ORDER — TOUJEO SOLOSTAR 300 UNIT/ML ~~LOC~~ SOPN
18.0000 [IU] | PEN_INJECTOR | Freq: Every day | SUBCUTANEOUS | 2 refills | Status: DC
Start: 2023-04-24 — End: 2023-08-31

## 2023-04-24 MED ORDER — TERBINAFINE HCL 250 MG PO TABS
250.0000 mg | ORAL_TABLET | Freq: Every day | ORAL | 0 refills | Status: DC
Start: 2023-04-24 — End: 2023-09-02

## 2023-04-24 MED ORDER — GABAPENTIN 300 MG PO CAPS
ORAL_CAPSULE | ORAL | 2 refills | Status: DC
Start: 2023-04-24 — End: 2023-12-02

## 2023-04-24 MED ORDER — MONTELUKAST SODIUM 10 MG PO TABS
10.0000 mg | ORAL_TABLET | Freq: Every day | ORAL | 2 refills | Status: DC
Start: 2023-04-24 — End: 2023-12-02

## 2023-04-24 MED ORDER — VITAMIN B-12 1000 MCG PO TABS
1000.0000 ug | ORAL_TABLET | Freq: Every day | ORAL | 1 refills | Status: AC
Start: 2023-04-24 — End: ?

## 2023-04-24 NOTE — Progress Notes (Signed)
[facility] [address] [place], [place] [address]  Internal MEDICINE  Office Visit Note  Patient Name: Daniel Hodges  [id number]  [id number]  Date of Service: 04/24/2023  Chief Complaint  Patient presents with   Follow-up    Eval new med and sugars and labs.     HPI Krrish presents for a follow-up visit for diabetes, hypertension and onychomycosis.  Diabetes -- mounjaro is too expensive. This was discontinued. Still taking 18 units of toujeo daily. Glipizide XL added.  Hypertension -- controlled with current medications, taking amlodipine and losartan.  Onychomycosis -- wants treatment for fungal toenail infection.     Current Medication: Outpatient Encounter Medications as of 04/24/2023  Medication Sig   albuterol (VENTOLIN HFA) 108 (90 Base) MCG/ACT inhaler Inhale 2 puffs into the lungs every 6 (six) hours as needed for wheezing or shortness of breath.   aspirin 81 MG tablet Take 81 mg by mouth daily.   atorvastatin (LIPITOR) 20 MG tablet TAKE 1 TABLET BY MOUTH AT  BEDTIME   chlorpheniramine-HYDROcodone (TUSSIONEX) 10-8 MG/5ML Take 5 mLs by mouth every 12 (twelve) hours as needed for cough.   cholecalciferol (VITAMIN D3) 25 MCG (1000 UT) tablet Take 1,000 Units by mouth daily.   Continuous Glucose Sensor (FREESTYLE LIBRE 2 SENSOR) MISC CHANGE EVERY 14 DAYS   feeding supplement, GLUCERNA SHAKE, (GLUCERNA SHAKE) LIQD One a day   ferrous sulfate 324 MG TBEC Take by mouth.   furosemide (LASIX) 20 MG tablet TAKE 1 TABLET BY MOUTH DAILY   HUMALOG KWIKPEN 100 UNIT/ML KwikPen Inject 5 Units into the skin 2 (two) times daily before a meal.   hydrocortisone 1 % ointment Apply small amount mix with lotion at night for leg rash   ipratropium-albuterol (DUONEB) 0.5-2.5 (3) MG/3ML SOLN USE 3 ML VIA NEBULIZER EVERY 6 HOURS AS NEEDED   loratadine (CLARITIN) 10 MG tablet Take 1 tablet (10 mg total) by mouth daily.   losartan (COZAAR) 25 MG tablet Take 1 tablet (25 mg  total) by mouth daily.   Magnesium 250 MG TABS Take 250 mg by mouth 2 (two) times daily.   mupirocin ointment (BACTROBAN) 2 % Apply 1 Application topically 2 (two) times daily. To superficial scratches on legs until resolved.   niacin (NIASPAN) 500 MG CR tablet niacin ER 500 mg tablet,extended release 24 hr   senna (SENOKOT) 8.6 MG tablet Take 1 tablet (8.6 mg total) by mouth daily.   simethicone (MYLICON) 80 MG chewable tablet Chew 2 tablets (160 mg total) by mouth 2 (two) times daily.   terbinafine (LAMISIL) 250 MG tablet Take 1 tablet (250 mg total) by mouth daily.   Tiotropium Bromide-Olodaterol (STIOLTO RESPIMAT) 2.5-2.5 MCG/ACT AERS Inhale 1 Inhalation into the lungs daily.   tirzepatide [facility]) 2.5 MG/0.5ML Pen Inject 2.5 mg into the skin once a week.   [DISCONTINUED] amLODipine (NORVASC) 2.5 MG tablet Take 1 tablet (2.5 mg total) by mouth daily.   [DISCONTINUED] cyanocobalamin (VITAMIN B12) 1000 MCG tablet Take 1 tablet (1,000 mcg total) by mouth daily.   [DISCONTINUED] gabapentin (NEURONTIN) 300 MG capsule TAKE 1 CAPSULE BY MOUTH  DAILY AND 1 CAPSULE BY  MOUTH AT NIGHT   [DISCONTINUED] montelukast (SINGULAIR) 10 MG tablet Take 1 tablet (10 mg total) by mouth daily.   [DISCONTINUED] TOUJEO SOLOSTAR 300 UNIT/ML Solostar Pen Inject 18 Units into the skin at bedtime.   amLODipine (NORVASC) 2.5 MG tablet Take 1 tablet (2.5 mg total) by mouth daily.   cyanocobalamin (VITAMIN B12)  1000 MCG tablet Take 1 tablet (1,000 mcg total) by mouth daily.   gabapentin (NEURONTIN) 300 MG capsule TAKE 1 CAPSULE BY MOUTH  DAILY AND 1 CAPSULE BY  MOUTH AT NIGHT   montelukast (SINGULAIR) 10 MG tablet Take 1 tablet (10 mg total) by mouth daily.   TOUJEO SOLOSTAR 300 UNIT/ML Solostar Pen Inject 18 Units into the skin at bedtime.   No facility-administered encounter medications on file as of 04/24/2023.    Surgical History: Past Surgical History:  Procedure Laterality Date   APPENDECTOMY     COLONOSCOPY  WITH PROPOFOL N/A 06/11/2015   Procedure: COLONOSCOPY WITH PROPOFOL;  Surgeon: Scot Jun, MD;  Location: [facility] ENDOSCOPY;  Service: Endoscopy;  Laterality: N/A;   CORONARY ARTERY BYPASS GRAFT     HERNIA REPAIR     TEE WITHOUT CARDIOVERSION     TRACHEOSTOMY     VASCULAR SURGERY      Medical History: Past Medical History:  Diagnosis Date   Anginal pain (HCC)    Asthma    Coronary artery disease    Diabetes mellitus without complication (HCC)    Hyperlipidemia    Hypertension    Sleep apnea     Family History: Family History  Problem Relation Age of Onset   Cancer Sister    Diabetes Daughter    Diabetes Son     Social History   Socioeconomic History   Marital status: Married    Spouse name: Not on file   Number of children: Not on file   Years of education: Not on file   Highest education level: Not on file  Occupational History   Not on file  Tobacco Use   Smoking status: Never   Smokeless tobacco: Never  Vaping Use   Vaping status: Never Used  Substance and Sexual Activity   Alcohol use: No   Drug use: No   Sexual activity: Not on file  Other Topics Concern   Not on file  Social History Narrative   Not on file   Social Determinants of Health   Financial Resource Strain: Not on file  Food Insecurity: No Food Insecurity (10/31/2022)   Hunger Vital Sign    Worried About Running Out of Food in the Last Year: Never true    Ran Out of Food in the Last Year: Never true  Transportation Needs: No Transportation Needs (10/31/2022)   PRAPARE - Administrator, Civil Service (Medical): No    Lack of Transportation (Non-Medical): No  Physical Activity: Not on file  Stress: Not on file  Social Connections: Not on file  Intimate Partner Violence: Not At Risk (10/31/2022)   Humiliation, Afraid, Rape, and Kick questionnaire    Fear of Current or Ex-Partner: No    Emotionally Abused: No    Physically Abused: No    Sexually Abused: No      Review  of Systems  Constitutional:  Negative for activity change, appetite change, chills, fatigue, fever and unexpected weight change.  HENT: Negative.  Negative for congestion, ear pain, rhinorrhea, sore throat and trouble swallowing.   Eyes: Negative.   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 Negative for chest tightness, shortness of breath and wheezing.   Cardiovascular: Negative.  Negative for chest pain and palpitations.  Gastrointestinal: Negative.  Negative for abdominal pain, blood in stool, constipation, diarrhea, nausea and vomiting.  Endocrine: Negative.   Genitourinary: Negative.  Negative for difficulty urinating, dysuria, frequency, hematuria and urgency.  Musculoskeletal: Negative.  Negative for arthralgias, back pain, joint swelling, myalgias and neck pain.  Skin: Negative.  Negative for rash and wound.  Allergic/Immunologic: Negative.  Negative for immunocompromised state.  Neurological: Negative.  Negative for dizziness, seizures, numbness and headaches.  Hematological: Negative.   Psychiatric/Behavioral: Negative.  Negative for behavioral problems, self-injury and suicidal ideas. The patient is not nervous/anxious.     Vital Signs: BP 134/70   Pulse 87   Temp 98.2 F (36.8 C)   Resp 16   Ht 5\' 6"  (1.676 m)   Wt 158 lb (71.7 kg)   SpO2 99%   BMI 25.50 kg/m    Physical Exam Vitals reviewed.  Constitutional:      General: He is awake. He is not in acute distress.    Appearance: Normal appearance. He is well-developed, well-groomed and overweight. He is not ill-appearing or diaphoretic.  HENT:     Head: Normocephalic and atraumatic.     Right Ear: Tympanic membrane, ear canal and external ear normal. There is no impacted cerumen.     Left Ear: Tympanic membrane, ear canal and external ear normal. There is no impacted cerumen.     Nose: Nose normal. No congestion or rhinorrhea.     Mouth/Throat:     Lips: Pink.     Mouth: Mucous  membranes are moist.     Pharynx: Oropharynx is clear. Uvula midline. No oropharyngeal exudate or posterior oropharyngeal erythema.  Eyes:     General: Lids are normal. Vision grossly intact. Gaze aligned appropriately. No scleral icterus.       Right eye: No discharge.        Left eye: No discharge.     Extraocular Movements: Extraocular movements intact.     Conjunctiva/sclera: Conjunctivae normal.     Pupils: Pupils are equal, round, and reactive to light.     Funduscopic exam:    Right eye: Red reflex present.        Left eye: Red reflex present. Neck:     Thyroid: No thyromegaly.     Vascular: No JVD.     Trachea: Trachea and phonation normal. No tracheal deviation.  Cardiovascular:     Rate and Rhythm: Normal rate and regular rhythm.     Pulses: Normal pulses.          Dorsalis pedis pulses are 2+ on the right side and 2+ on the left side.       Posterior tibial pulses are 2+ on the right side and 2+ on the left side.     Heart sounds: Normal heart sounds, S1 normal and S2 normal. No murmur heard.    No friction rub. No gallop.  Pulmonary:     Effort: Pulmonary effort is normal. No accessory muscle usage or respiratory distress.     Breath sounds: Normal breath sounds and air entry. No stridor. No wheezing or rales.  Chest:     Chest wall: No tenderness.  Abdominal:     General: Bowel sounds are normal. There is no distension.     Palpations: Abdomen is soft. There is no mass.     Tenderness: There is no abdominal tenderness. There is no guarding or rebound.  Musculoskeletal:        General: No tenderness or deformity. Normal range of motion.     Cervical back: Normal range of motion and neck supple.     Right lower leg: No edema.     Left lower leg: No edema.  Right foot: Normal range of motion. No deformity, bunion, Charcot foot, foot drop or prominent metatarsal heads.     Left foot: Normal range of motion. No deformity, bunion, Charcot foot, foot drop or prominent  metatarsal heads.  Feet:     Right foot:     Protective Sensation: 6 sites tested.  6 sites sensed.     Skin integrity: Dry skin present. No ulcer, blister, skin breakdown, erythema, warmth, callus or fissure.     Toenail Condition: Right toenails are abnormally thick and long.     Left foot:     Protective Sensation: 6 sites tested.  6 sites sensed.     Skin integrity: Dry skin present. No ulcer, blister, skin breakdown, erythema, warmth, callus or fissure.     Toenail Condition: Left toenails are abnormally thick and long.  Lymphadenopathy:     Cervical: No cervical adenopathy.  Skin:    General: Skin is warm and dry.     Capillary Refill: Capillary refill takes less than 2 seconds.     Coloration: Skin is not pale.     Findings: No erythema or rash.  Neurological:     Mental Status: He is alert and oriented to person, place, and time.     Cranial Nerves: No cranial nerve deficit.     Motor: No abnormal muscle tone.     Coordination: Coordination normal.     Gait: Gait normal.     Deep Tendon Reflexes: Reflexes are normal and symmetric.  Psychiatric:        Mood and Affect: Mood and affect normal.        Behavior: Behavior normal. Behavior is cooperative.        Thought Content: Thought content normal.        Judgment: Judgment normal.        Assessment/Plan: 1. Type 2 diabetes mellitus with hyperglycemia, with long-term current use of insulin (HCC) Continue current medications as prescribed.  - TOUJEO SOLOSTAR 300 UNIT/ML Solostar Pen; Inject 18 Units into the skin at bedtime.  Dispense: 6 mL; Refill: 2  2. Stage 3a chronic kidney disease (HCC) Stable, continue to monitor periodically.   3. Essential hypertension [place], continue losartan and amlodipine as prescribed.  - amLODipine (NORVASC) 2.5 MG tablet; Take 1 tablet (2.5 mg total) by mouth daily.  Dispense: 90 tablet; Refill: 1  4. Diabetic polyneuropathy associated with type 2 diabetes mellitus (HCC) Continue  gabapentin as prescribed  - gabapentin (NEURONTIN) 300 MG capsule; TAKE 1 CAPSULE BY MOUTH  DAILY AND 1 CAPSULE BY  MOUTH AT NIGHT  Dispense: 200 capsule; Refill: 2  5. 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 - terbinafine (LAMISIL) 250 MG tablet; Take 1 tablet (250 mg total) by mouth daily.  Dispense: 90 tablet; Refill: 0  6. Encounter for medication review Medication list reviewed, updated and refills ordered  - montelukast (SINGULAIR) 10 MG tablet; Take 1 tablet (10 mg total) by mouth daily.  Dispense: 100 tablet; Refill: 2 - cyanocobalamin (VITAMIN B12) 1000 MCG tablet; Take 1 tablet (1,000 mcg total) by mouth daily.  Dispense: 90 tablet; Refill: 1   General Counseling: Tahji verbalizes understanding of the findings of todays visit and agrees with plan of treatment. I have discussed any further diagnostic evaluation that may be needed or ordered today. We also reviewed his medications today. he has been encouraged to call the office with any questions or concerns that should arise  related to todays visit.    No orders of the defined types were placed in this encounter.   Meds ordered this encounter  Medications   TOUJEO SOLOSTAR 300 UNIT/ML Solostar Pen    Sig: Inject 18 Units into the skin at bedtime.    Dispense:  6 mL    Refill:  2   terbinafine (LAMISIL) 250 MG tablet    Sig: Take 1 tablet (250 mg total) by mouth daily.    Dispense:  90 tablet    Refill:  0   montelukast (SINGULAIR) 10 MG tablet    Sig: Take 1 tablet (10 mg total) by mouth daily.    Dispense:  100 tablet    Refill:  2    Please send a replace/new response with 100-Day Supply if appropriate to maximize member benefit. Requesting 1 year supply.   gabapentin (NEURONTIN) 300 MG capsule    Sig: TAKE 1 CAPSULE BY MOUTH  DAILY AND 1 CAPSULE BY  MOUTH AT NIGHT    Dispense:  200 capsule    Refill:  2    Requesting 1 year  supply   amLODipine (NORVASC) 2.5 MG tablet    Sig: Take 1 tablet (2.5 mg total) by mouth daily.    Dispense:  90 tablet    Refill:  1    **Patient requests 90 days supply**   cyanocobalamin (VITAMIN B12) 1000 MCG tablet    Sig: Take 1 tablet (1,000 mcg total) by mouth daily.    Dispense:  90 tablet    Refill:  1    Return in about 3 months (around 07/25/2023) for F/U, Recheck A1C, Myriam Brandhorst PCP.   Total time spent:30 Minutes Time spent includes review of chart, medications, test results, and follow up plan with the patient.   [place] Controlled Substance Database was reviewed by me.  This patient was seen by Sallyanne Kuster, FNP-C in collaboration with Dr. Beverely Risen as a part of collaborative care agreement.   Clark Cuff R. Tedd Sias, MSN, FNP-C Internal medicine

## 2023-04-27 ENCOUNTER — Telehealth: Payer: Self-pay

## 2023-05-01 MED ORDER — GLIPIZIDE ER 5 MG PO TB24: 5.0000 mg | ORAL_TABLET | Freq: Every day | ORAL | 1 refills | Status: DC

## 2023-05-01 NOTE — Telephone Encounter (Signed)
Pt wife notified.

## 2023-05-06 ENCOUNTER — Other Ambulatory Visit: Payer: Self-pay

## 2023-05-06 DIAGNOSIS — I10 Essential (primary) hypertension: Secondary | ICD-10-CM

## 2023-05-06 MED ORDER — LOSARTAN POTASSIUM 25 MG PO TABS: 25.0000 mg | ORAL_TABLET | Freq: Every day | ORAL | 3 refills | Status: DC

## 2023-05-09 ENCOUNTER — Encounter: Payer: Self-pay | Admitting: Nurse Practitioner

## 2023-05-21 DIAGNOSIS — B351 Tinea unguium: Secondary | ICD-10-CM | POA: Diagnosis not present

## 2023-05-21 DIAGNOSIS — M79675 Pain in left toe(s): Secondary | ICD-10-CM | POA: Diagnosis not present

## 2023-05-21 DIAGNOSIS — M79674 Pain in right toe(s): Secondary | ICD-10-CM | POA: Diagnosis not present

## 2023-05-21 DIAGNOSIS — E119 Type 2 diabetes mellitus without complications: Secondary | ICD-10-CM | POA: Diagnosis not present

## 2023-07-02 ENCOUNTER — Other Ambulatory Visit: Payer: Self-pay | Admitting: Nurse Practitioner

## 2023-07-02 DIAGNOSIS — I10 Essential (primary) hypertension: Secondary | ICD-10-CM

## 2023-07-17 ENCOUNTER — Other Ambulatory Visit: Payer: Self-pay | Admitting: Nurse Practitioner

## 2023-07-17 DIAGNOSIS — B351 Tinea unguium: Secondary | ICD-10-CM

## 2023-07-20 NOTE — Telephone Encounter (Signed)
Please review

## 2023-07-22 DIAGNOSIS — I2510 Atherosclerotic heart disease of native coronary artery without angina pectoris: Secondary | ICD-10-CM | POA: Diagnosis not present

## 2023-07-22 DIAGNOSIS — R0602 Shortness of breath: Secondary | ICD-10-CM | POA: Diagnosis not present

## 2023-07-22 DIAGNOSIS — I10 Essential (primary) hypertension: Secondary | ICD-10-CM | POA: Diagnosis not present

## 2023-07-22 DIAGNOSIS — E782 Mixed hyperlipidemia: Secondary | ICD-10-CM | POA: Diagnosis not present

## 2023-07-22 DIAGNOSIS — G4733 Obstructive sleep apnea (adult) (pediatric): Secondary | ICD-10-CM | POA: Diagnosis not present

## 2023-07-22 DIAGNOSIS — Z951 Presence of aortocoronary bypass graft: Secondary | ICD-10-CM | POA: Diagnosis not present

## 2023-08-03 ENCOUNTER — Telehealth: Payer: Self-pay | Admitting: Nurse Practitioner

## 2023-08-03 ENCOUNTER — Ambulatory Visit (INDEPENDENT_AMBULATORY_CARE_PROVIDER_SITE_OTHER): Payer: Medicare Other | Admitting: Nurse Practitioner

## 2023-08-03 ENCOUNTER — Encounter: Payer: Self-pay | Admitting: Nurse Practitioner

## 2023-08-03 VITALS — BP 130/64 | HR 84 | Temp 98.3°F | Resp 16 | Ht 66.0 in | Wt 151.2 lb

## 2023-08-03 DIAGNOSIS — K5909 Other constipation: Secondary | ICD-10-CM | POA: Diagnosis not present

## 2023-08-03 DIAGNOSIS — J449 Chronic obstructive pulmonary disease, unspecified: Secondary | ICD-10-CM

## 2023-08-03 DIAGNOSIS — E1165 Type 2 diabetes mellitus with hyperglycemia: Secondary | ICD-10-CM

## 2023-08-03 DIAGNOSIS — Z794 Long term (current) use of insulin: Secondary | ICD-10-CM

## 2023-08-03 LAB — POCT GLYCOSYLATED HEMOGLOBIN (HGB A1C): Hemoglobin A1C: 8.8 % — AB (ref 4.0–5.6)

## 2023-08-03 MED ORDER — TRELEGY ELLIPTA 100-62.5-25 MCG/ACT IN AEPB
1.0000 | INHALATION_SPRAY | Freq: Every day | RESPIRATORY_TRACT | 11 refills | Status: DC
Start: 2023-08-03 — End: 2023-09-02

## 2023-08-03 MED ORDER — DOCUSATE SODIUM 50 MG PO CAPS
50.0000 mg | ORAL_CAPSULE | Freq: Two times a day (BID) | ORAL | 2 refills | Status: AC
Start: 2023-08-03 — End: ?

## 2023-08-03 MED ORDER — HUMALOG KWIKPEN 100 UNIT/ML ~~LOC~~ SOPN
PEN_INJECTOR | SUBCUTANEOUS | 2 refills | Status: DC
Start: 2023-08-03 — End: 2023-10-16

## 2023-08-03 NOTE — Progress Notes (Unsigned)
[facility] [address] [place], [place] [address]  Internal MEDICINE  Office Visit Note  Patient Name: Daniel Hodges  [id number]  [id number]  Date of Service: 08/03/2023  Chief Complaint  Patient presents with  . Diabetes  . Hypertension  . Hyperlipidemia    HPI [name] presents for a follow-up visit for diabetes, COPD and constipation. No family members accompanied Mr. [place] today.  Diabetes -- sugars have been high everytime, A1c is elevated at 8.8.  Morning sugars-- 80 - 110, sugar before meals is usually in the high 200s. He needs sliding scale with insulin instead of set amount since this has not been controlling sugars.  Constipation -- needs a stool softener.  COPD and cough -- current inhalers are not controlling cough and other symptoms well enough.      Current Medication: Outpatient Encounter Medications as of 08/03/2023  Medication Sig  . albuterol (VENTOLIN HFA) 108 (90 Base) MCG/ACT inhaler Inhale 2 puffs into the lungs every 6 (six) hours as needed for wheezing or shortness of breath.  Marland Kitchen amLODipine (NORVASC) 2.5 MG tablet TAKE 1 TABLET BY MOUTH DAILY  . aspirin 81 MG tablet Take 81 mg by mouth daily.  Marland Kitchen atorvastatin (LIPITOR) 20 MG tablet TAKE 1 TABLET BY MOUTH AT  BEDTIME  . chlorpheniramine-HYDROcodone (TUSSIONEX) 10-8 MG/5ML Take 5 mLs by mouth every 12 (twelve) hours as needed for cough.  . cholecalciferol (VITAMIN D3) 25 MCG (1000 UT) tablet Take 1,000 Units by mouth daily.  . Continuous Glucose Sensor (FREESTYLE LIBRE 2 SENSOR) MISC CHANGE EVERY 14 DAYS  . cyanocobalamin (VITAMIN B12) 1000 MCG tablet Take 1 tablet (1,000 mcg total) by mouth daily.  Marland Kitchen docusate sodium (COLACE) 50 MG capsule Take 1 capsule (50 mg total) by mouth 2 (two) times daily.  . feeding supplement, GLUCERNA SHAKE, (GLUCERNA SHAKE) LIQD One a day  . ferrous sulfate 324 MG TBEC Take by mouth.  . Fluticasone-Umeclidin-Vilant (TRELEGY ELLIPTA) 100-62.5-25 MCG/ACT  AEPB Inhale 1 puff into the lungs daily.  . furosemide (LASIX) 20 MG tablet TAKE 1 TABLET BY MOUTH DAILY  . gabapentin (NEURONTIN) 300 MG capsule TAKE 1 CAPSULE BY MOUTH  DAILY AND 1 CAPSULE BY  MOUTH AT NIGHT  . glipiZIDE (GLUCOTROL XL) 5 MG 24 hr tablet Take 1 tablet (5 mg total) by mouth daily with breakfast.  . hydrocortisone 1 % ointment Apply small amount mix with lotion at night for leg rash  . ipratropium-albuterol (DUONEB) 0.5-2.5 (3) MG/3ML SOLN USE 3 ML VIA NEBULIZER EVERY 6 HOURS AS NEEDED  . loratadine (CLARITIN) 10 MG tablet Take 1 tablet (10 mg total) by mouth daily.  Marland Kitchen losartan (COZAAR) 25 MG tablet Take 1 tablet (25 mg total) by mouth daily.  . Magnesium 250 MG TABS Take 250 mg by mouth 2 (two) times daily.  . montelukast (SINGULAIR) 10 MG tablet Take 1 tablet (10 mg total) by mouth daily.  . mupirocin ointment (BACTROBAN) 2 % Apply 1 Application topically 2 (two) times daily. To superficial scratches on legs until resolved.  . niacin (NIASPAN) 500 MG CR tablet niacin ER 500 mg tablet,extended release 24 hr  . senna (SENOKOT) 8.6 MG tablet Take 1 tablet (8.6 mg total) by mouth daily.  . simethicone (MYLICON) 80 MG chewable tablet Chew 2 tablets (160 mg total) by mouth 2 (two) times daily.  Marland Kitchen terbinafine (LAMISIL) 250 MG tablet Take 1 tablet (250 mg total) by mouth daily.  . Tiotropium Bromide-Olodaterol (STIOLTO RESPIMAT) 2.5-2.5 MCG/ACT AERS Inhale  1 Inhalation into the lungs daily.  Nathen May SOLOSTAR 300 UNIT/ML Solostar Pen Inject 18 Units into the skin at bedtime.  . [DISCONTINUED] HUMALOG KWIKPEN 100 UNIT/ML KwikPen Inject 5 Units into the skin 2 (two) times daily before a meal.  . HUMALOG KWIKPEN 100 UNIT/ML KwikPen Inject insulin into the skin with meals per sliding scale provided to patient. Max dose per 24 hours: 42 units.   No facility-administered encounter medications on file as of 08/03/2023.    Surgical History: Past Surgical History:  Procedure Laterality Date   . APPENDECTOMY    . COLONOSCOPY WITH PROPOFOL N/A 06/11/2015   Procedure: COLONOSCOPY WITH PROPOFOL;  Surgeon: Scot Jun, MD;  Location: [facility] ENDOSCOPY;  Service: Endoscopy;  Laterality: N/A;  . CORONARY ARTERY BYPASS GRAFT    . HERNIA REPAIR    . TEE WITHOUT CARDIOVERSION    . TRACHEOSTOMY    . VASCULAR SURGERY      Medical History: Past Medical History:  Diagnosis Date  . Anginal pain (HCC)   . Asthma   . Coronary artery disease   . Diabetes mellitus without complication (HCC)   . Hyperlipidemia   . Hypertension   . Sleep apnea     Family History: Family History  Problem Relation Age of Onset  . Cancer Sister   . Diabetes Daughter   . Diabetes Son     Social History   Socioeconomic History  . Marital status: Married    Spouse name: Not on file  . Number of children: Not on file  . Years of education: Not on file  . Highest education level: Not on file  Occupational History  . Not on file  Tobacco Use  . Smoking status: Never  . Smokeless tobacco: Never  Vaping Use  . Vaping status: Never Used  Substance and Sexual Activity  . Alcohol use: No  . Drug use: No  . Sexual activity: Not on file  Other Topics Concern  . Not on file  Social History Narrative  . Not on file   Social Determinants of Health   Financial Resource Strain: Not on file  Food Insecurity: No Food Insecurity (10/31/2022)   Hunger Vital Sign   . Worried About Programme researcher, broadcasting/film/video in the Last Year: Never true   . Ran Out of Food in the Last Year: Never true  Transportation Needs: No Transportation Needs (10/31/2022)   PRAPARE - Transportation   . Lack of Transportation (Medical): No   . Lack of Transportation (Non-Medical): No  Physical Activity: Not on file  Stress: Not on file  Social Connections: Not on file  Intimate Partner Violence: Not At Risk (10/31/2022)   Humiliation, Afraid, Rape, and Kick questionnaire   . Fear of Current or Ex-Partner: No   . Emotionally Abused: No    . Physically Abused: No   . Sexually Abused: No      Review of Systems  Constitutional:  Negative for activity change, appetite change, chills, fatigue, fever and unexpected weight change.  HENT: Negative.  Negative for congestion, ear pain, rhinorrhea, sore throat and trouble swallowing.   Eyes: Negative.   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 Negative for chest tightness, shortness of breath and wheezing.   Cardiovascular: Negative.  Negative for chest pain and palpitations.  Gastrointestinal: Negative.  Negative for abdominal pain, blood in stool, constipation, diarrhea, nausea and vomiting.  Endocrine: Negative.   Genitourinary: Negative.  Negative for difficulty urinating, dysuria, frequency,  hematuria and urgency.  Musculoskeletal: Negative.  Negative for arthralgias, back pain, joint swelling, myalgias and neck pain.  Skin: Negative.  Negative for rash and wound.  Allergic/Immunologic: Negative.  Negative for immunocompromised state.  Neurological: Negative.  Negative for dizziness, seizures, numbness and headaches.  Hematological: Negative.   Psychiatric/Behavioral: Negative.  Negative for behavioral problems, self-injury and suicidal ideas. The patient is not nervous/anxious.     Vital Signs: BP 130/64   Pulse 84   Temp 98.3 F (36.8 C)   Resp 16   Ht 5\' 6"  (1.676 m)   Wt 151 lb 3.2 oz (68.6 kg)   SpO2 97%   BMI 24.40 kg/m    Physical Exam Vitals reviewed.  Constitutional:      Appearance: Normal appearance.  HENT:     Head: Normocephalic and atraumatic.  Eyes:     Pupils: Pupils are equal, round, and reactive to light.  Cardiovascular:     Rate and Rhythm: Normal rate and regular rhythm.     Heart sounds: Normal heart sounds. No murmur heard. Pulmonary:     Effort: Pulmonary effort is normal. No respiratory distress.     Breath sounds: Normal breath sounds. No wheezing.  Neurological:     Mental Status: He is alert and  oriented to person, place, and time.  Psychiatric:        Mood and Affect: Mood normal.        Behavior: Behavior normal.       Assessment/Plan: 1. Type 2 diabetes mellitus with hyperglycemia, with long-term current use of insulin (HCC) *** - POCT glycosylated hemoglobin (Hb A1C) - HUMALOG KWIKPEN 100 UNIT/ML KwikPen; Inject insulin into the skin with meals per sliding scale provided to patient. Max dose per 24 hours: 42 units.  Dispense: 15 mL; Refill: 2  2. COPD mixed type (HCC) *** - Fluticasone-Umeclidin-Vilant (TRELEGY ELLIPTA) 100-62.5-25 MCG/ACT AEPB; Inhale 1 puff into the lungs daily.  Dispense: 1 each; Refill: 11  3. Other constipation *** - docusate sodium (COLACE) 50 MG capsule; Take 1 capsule (50 mg total) by mouth 2 (two) times daily.  Dispense: 60 capsule; Refill: 2   General Counseling: Jery verbalizes understanding of the findings of todays visit and agrees with plan of treatment. I have discussed any further diagnostic evaluation that may be needed or ordered today. We also reviewed his medications today. he has been encouraged to call the office with any questions or concerns that should arise related to todays visit.    Orders Placed This Encounter  Procedures  . POCT glycosylated hemoglobin (Hb A1C)    Meds ordered this encounter  Medications  . HUMALOG KWIKPEN 100 UNIT/ML KwikPen    Sig: Inject insulin into the skin with meals per sliding scale provided to patient. Max dose per 24 hours: 42 units.    Dispense:  15 mL    Refill:  2    Fill new script today.  . docusate sodium (COLACE) 50 MG capsule    Sig: Take 1 capsule (50 mg total) by mouth 2 (two) times daily.    Dispense:  60 capsule    Refill:  2    Fill new script today  . Fluticasone-Umeclidin-Vilant (TRELEGY ELLIPTA) 100-62.5-25 MCG/ACT AEPB    Sig: Inhale 1 puff into the lungs daily.    Dispense:  1 each    Refill:  11    Return in about 4 weeks (around 08/31/2023) for F/U,  Corrissa Martello PCP -- sugars, sliding scale and trelegy.  Total time spent:30 Minutes Time spent includes review of chart, medications, test results, and follow up plan with the patient.   [place] Controlled Substance Database was reviewed by me.  This patient was seen by Sallyanne Kuster, FNP-C in collaboration with Dr. Beverely Risen as a part of collaborative care agreement.   Cailin Gebel R. Tedd Sias, MSN, FNP-C Internal medicine

## 2023-08-03 NOTE — Telephone Encounter (Signed)
Lvm to schedule 4 week follow up-Toni

## 2023-08-04 ENCOUNTER — Encounter: Payer: Self-pay | Admitting: Nurse Practitioner

## 2023-08-05 DIAGNOSIS — I2510 Atherosclerotic heart disease of native coronary artery without angina pectoris: Secondary | ICD-10-CM | POA: Diagnosis not present

## 2023-08-31 ENCOUNTER — Other Ambulatory Visit: Payer: Self-pay | Admitting: Nurse Practitioner

## 2023-08-31 DIAGNOSIS — E1165 Type 2 diabetes mellitus with hyperglycemia: Secondary | ICD-10-CM

## 2023-08-31 MED ORDER — GLIPIZIDE ER 5 MG PO TB24: 5.0000 mg | ORAL_TABLET | Freq: Every day | ORAL | 1 refills | Status: DC

## 2023-09-02 ENCOUNTER — Encounter: Payer: Self-pay | Admitting: Nurse Practitioner

## 2023-09-02 ENCOUNTER — Ambulatory Visit (INDEPENDENT_AMBULATORY_CARE_PROVIDER_SITE_OTHER): Payer: Medicare Other | Admitting: Nurse Practitioner

## 2023-09-02 VITALS — BP 130/66 | HR 71 | Temp 98.5°F | Resp 16 | Ht 66.0 in | Wt 154.2 lb

## 2023-09-02 DIAGNOSIS — I152 Hypertension secondary to endocrine disorders: Secondary | ICD-10-CM

## 2023-09-02 DIAGNOSIS — N1831 Chronic kidney disease, stage 3a: Secondary | ICD-10-CM | POA: Diagnosis not present

## 2023-09-02 DIAGNOSIS — Z794 Long term (current) use of insulin: Secondary | ICD-10-CM | POA: Diagnosis not present

## 2023-09-02 DIAGNOSIS — E1159 Type 2 diabetes mellitus with other circulatory complications: Secondary | ICD-10-CM | POA: Diagnosis not present

## 2023-09-02 DIAGNOSIS — J449 Chronic obstructive pulmonary disease, unspecified: Secondary | ICD-10-CM | POA: Diagnosis not present

## 2023-09-02 DIAGNOSIS — E1122 Type 2 diabetes mellitus with diabetic chronic kidney disease: Secondary | ICD-10-CM | POA: Insufficient documentation

## 2023-09-02 MED ORDER — GLIPIZIDE ER 5 MG PO TB24: 5.0000 mg | ORAL_TABLET | Freq: Every day | ORAL | 0 refills | Status: DC

## 2023-09-02 MED ORDER — TRELEGY ELLIPTA 100-62.5-25 MCG/ACT IN AEPB: 1.0000 | INHALATION_SPRAY | Freq: Every day | RESPIRATORY_TRACT | 11 refills | Status: AC

## 2023-09-02 MED ORDER — FREESTYLE LIBRE 2 SENSOR MISC: 5 refills | Status: DC

## 2023-09-02 NOTE — Progress Notes (Signed)
[facility] [address] [place], [place] [address]  Internal MEDICINE  Office Visit Note  Patient Name: Daniel Hodges  [id number]  [id number]  Date of Service: 09/02/2023  Chief Complaint  Patient presents with   Diabetes   Hypertension   Hyperlipidemia   Follow-up    HPI Daniel Hodges presents for a follow-up visit for diabetes, hypertension and COPD.  Diabetes -- ran out of glipizide and is having high glucose readings again after meals. Glucose levels were improving with glipizide and using sliding scale as discussed with patient.  Hypertension -- controlled with losartan, amlodipine, and furosemide.  COPD -- breathing improving with trelegy, will try to get approved by his insurance.      Current Medication: Outpatient Encounter Medications as of 09/02/2023  Medication Sig   albuterol (VENTOLIN HFA) 108 (90 Base) MCG/ACT inhaler Inhale 2 puffs into the lungs every 6 (six) hours as needed for wheezing or shortness of breath.   amLODipine (NORVASC) 2.5 MG tablet TAKE 1 TABLET BY MOUTH DAILY   aspirin 81 MG tablet Take 81 mg by mouth daily.   atorvastatin (LIPITOR) 20 MG tablet TAKE 1 TABLET BY MOUTH AT  BEDTIME   chlorpheniramine-HYDROcodone (TUSSIONEX) 10-8 MG/5ML Take 5 mLs by mouth every 12 (twelve) hours as needed for cough.   cholecalciferol (VITAMIN D3) 25 MCG (1000 UT) tablet Take 1,000 Units by mouth daily.   cyanocobalamin (VITAMIN B12) 1000 MCG tablet Take 1 tablet (1,000 mcg total) by mouth daily.   docusate sodium (COLACE) 50 MG capsule Take 1 capsule (50 mg total) by mouth 2 (two) times daily.   feeding supplement, GLUCERNA SHAKE, (GLUCERNA SHAKE) LIQD One a day   ferrous sulfate 324 MG TBEC Take by mouth.   furosemide (LASIX) 20 MG tablet TAKE 1 TABLET BY MOUTH DAILY   gabapentin (NEURONTIN) 300 MG capsule TAKE 1 CAPSULE BY MOUTH  DAILY AND 1 CAPSULE BY  MOUTH AT NIGHT   glipiZIDE (GLUCOTROL XL) 5 MG 24 hr tablet Take 1 tablet (5 mg total) by  mouth daily with breakfast.   glipiZIDE (GLUCOTROL XL) 5 MG 24 hr tablet Take 1 tablet (5 mg total) by mouth daily with breakfast.   HUMALOG KWIKPEN 100 UNIT/ML KwikPen Inject insulin into the skin with meals per sliding scale provided to patient. Max dose per 24 hours: 42 units.   hydrocortisone 1 % ointment Apply small amount mix with lotion at night for leg rash   ipratropium-albuterol (DUONEB) 0.5-2.5 (3) MG/3ML SOLN USE 3 ML VIA NEBULIZER EVERY 6 HOURS AS NEEDED   loratadine (CLARITIN) 10 MG tablet Take 1 tablet (10 mg total) by mouth daily.   losartan (COZAAR) 25 MG tablet Take 1 tablet (25 mg total) by mouth daily.   Magnesium 250 MG TABS Take 250 mg by mouth 2 (two) times daily.   montelukast (SINGULAIR) 10 MG tablet Take 1 tablet (10 mg total) by mouth daily.   mupirocin ointment (BACTROBAN) 2 % Apply 1 Application topically 2 (two) times daily. To superficial scratches on legs until resolved.   niacin (NIASPAN) 500 MG CR tablet niacin ER 500 mg tablet,extended release 24 hr   senna (SENOKOT) 8.6 MG tablet Take 1 tablet (8.6 mg total) by mouth daily.   simethicone (MYLICON) 80 MG chewable tablet Chew 2 tablets (160 mg total) by mouth 2 (two) times daily.   Tiotropium Bromide-Olodaterol (STIOLTO RESPIMAT) 2.5-2.5 MCG/ACT AERS Inhale 1 Inhalation into the lungs daily.   TOUJEO SOLOSTAR 300 UNIT/ML Solostar Pen INJECT  SUBCUTANEOUSLY 18 UNITS  AT BEDTIME   [DISCONTINUED] Continuous Glucose Sensor (FREESTYLE LIBRE 2 SENSOR) MISC CHANGE EVERY 14 DAYS   [DISCONTINUED] Fluticasone-Umeclidin-Vilant (TRELEGY ELLIPTA) 100-62.5-25 MCG/ACT AEPB Inhale 1 puff into the lungs daily.   [DISCONTINUED] terbinafine (LAMISIL) 250 MG tablet Take 1 tablet (250 mg total) by mouth daily.   Continuous Glucose Sensor (FREESTYLE LIBRE 2 SENSOR) MISC CHANGE EVERY 14 DAYS   Fluticasone-Umeclidin-Vilant (TRELEGY ELLIPTA) 100-62.5-25 MCG/ACT AEPB Inhale 1 puff into the lungs daily.   No facility-administered encounter  medications on file as of 09/02/2023.    Surgical History: Past Surgical History:  Procedure Laterality Date   APPENDECTOMY     COLONOSCOPY WITH PROPOFOL N/A 06/11/2015   Procedure: COLONOSCOPY WITH PROPOFOL;  Surgeon: Scot Jun, MD;  Location: [facility] ENDOSCOPY;  Service: Endoscopy;  Laterality: N/A;   CORONARY ARTERY BYPASS GRAFT     HERNIA REPAIR     TEE WITHOUT CARDIOVERSION     TRACHEOSTOMY     VASCULAR SURGERY      Medical History: Past Medical History:  Diagnosis Date   Anginal pain (HCC)    Asthma    Coronary artery disease    Diabetes mellitus without complication (HCC)    Hyperlipidemia    Hypertension    Sleep apnea     Family History: Family History  Problem Relation Age of Onset   Cancer Sister    Diabetes Daughter    Diabetes Son     Social History   Socioeconomic History   Marital status: Married    Spouse name: Not on file   Number of children: Not on file   Years of education: Not on file   Highest education level: Not on file  Occupational History   Not on file  Tobacco Use   Smoking status: Never   Smokeless tobacco: Never  Vaping Use   Vaping status: Never Used  Substance and Sexual Activity   Alcohol use: No   Drug use: No   Sexual activity: Not on file  Other Topics Concern   Not on file  Social History Narrative   Not on file   Social Drivers of Health   Financial Resource Strain: Not on file  Food Insecurity: No Food Insecurity (10/31/2022)   Hunger Vital Sign    Worried About Running Out of Food in the Last Year: Never true    Ran Out of Food in the Last Year: Never true  Transportation Needs: No Transportation Needs (10/31/2022)   PRAPARE - Administrator, Civil Service (Medical): No    Lack of Transportation (Non-Medical): No  Physical Activity: Not on file  Stress: Not on file  Social Connections: Not on file  Intimate Partner Violence: Not At Risk (10/31/2022)   Humiliation, Afraid, Rape, and Kick  questionnaire    Fear of Current or Ex-Partner: No    Emotionally Abused: No    Physically Abused: No    Sexually Abused: No      Review of Systems  Constitutional:  Negative for activity change, appetite change, chills, fatigue, fever and unexpected weight change.  HENT: Negative.  Negative for congestion, ear pain, rhinorrhea, sore throat and trouble swallowing.   Eyes: Negative.   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 Negative for chest tightness, shortness of breath and wheezing.   Cardiovascular: Negative.  Negative for chest pain and palpitations.  Gastrointestinal: Negative.  Negative for abdominal pain, blood in stool, constipation, diarrhea, nausea and vomiting.  Endocrine: Negative.   Genitourinary: Negative.  Negative for difficulty urinating, dysuria, frequency, hematuria and urgency.  Musculoskeletal: Negative.  Negative for arthralgias, back pain, joint swelling, myalgias and neck pain.  Skin: Negative.  Negative for rash and wound.  Allergic/Immunologic: Negative.  Negative for immunocompromised state.  Neurological: Negative.  Negative for dizziness, seizures, numbness and headaches.  Hematological: Negative.   Psychiatric/Behavioral: Negative.  Negative for behavioral problems, self-injury and suicidal ideas. The patient is not nervous/anxious.     Vital Signs: BP 130/66   Pulse 71   Temp 98.5 F (36.9 C)   Resp 16   Ht 5\' 6"  (1.676 m)   Wt 154 lb 3.2 oz (69.9 kg)   SpO2 96%   BMI 24.89 kg/m    Physical Exam Vitals reviewed.  Constitutional:      Appearance: Normal appearance.  HENT:     Head: Normocephalic and atraumatic.  Eyes:     Pupils: Pupils are equal, round, and reactive to light.  Cardiovascular:     Rate and Rhythm: Normal rate and regular rhythm.     Heart sounds: Normal heart sounds. No murmur heard. Pulmonary:     Effort: Pulmonary effort is normal. No respiratory distress.     Breath sounds: Normal  breath sounds. No wheezing.  Neurological:     Mental Status: He is alert and oriented to person, place, and time.  Psychiatric:        Mood and Affect: Mood normal.        Behavior: Behavior normal.        Assessment/Plan: 1. Type 2 diabetes mellitus with stage 3a chronic kidney disease, with long-term current use of insulin (HCC) (Primary) Continue using freestyle libre sensors and glipizide as prescribed. Continue sliding scale insulin as discussed and continue toujeo at current dose.  - Continuous Glucose Sensor (FREESTYLE LIBRE 2 SENSOR) MISC; CHANGE EVERY 14 DAYS  Dispense: 2 each; Refill: 5 - glipiZIDE (GLUCOTROL XL) 5 MG 24 hr tablet; Take 1 tablet (5 mg total) by mouth daily with breakfast.  Dispense: 30 tablet; Refill: 0  2. COPD mixed type [facility]) Trelegy sample given to patient again and trelegy ordered  - Fluticasone-Umeclidin-Vilant (TRELEGY ELLIPTA) 100-62.5-25 MCG/ACT AEPB; Inhale 1 puff into the lungs daily.  Dispense: 1 each; Refill: 11  3. Stage 3a chronic kidney disease (HCC) Continue losartan as prescribed.   4. Hypertension associated with diabetes (HCC) Continue losartan, furosemide, and amlodipine as prescribed.   General Counseling: Octavis verbalizes understanding of the findings of todays visit and agrees with plan of treatment. I have discussed any further diagnostic evaluation that may be needed or ordered today. We also reviewed his medications today. he has been encouraged to call the office with any questions or concerns that should arise related to todays visit.    Orders Placed This Encounter  Procedures   Urine Microalbumin w/creat. ratio    Meds ordered this encounter  Medications   Fluticasone-Umeclidin-Vilant (TRELEGY ELLIPTA) 100-62.5-25 MCG/ACT AEPB    Sig: Inhale 1 puff into the lungs daily.    Dispense:  1 each    Refill:  11    Please fill new script today   Continuous Glucose Sensor (FREESTYLE LIBRE 2 SENSOR) MISC    Sig:  CHANGE EVERY 14 DAYS    Dispense:  2 each    Refill:  5   glipiZIDE (GLUCOTROL XL) 5 MG 24 hr tablet    Sig: Take 1 tablet (5 mg total) by mouth daily with breakfast.  Dispense:  30 tablet    Refill:  0    Please fill today, dx code E11.65    Return in about 3 months (around 12/01/2023) for F/U, Recheck A1C, Ceylin Dreibelbis PCP.   Total time spent:30 Minutes Time spent includes review of chart, medications, test results, and follow up plan with the patient.   [place] Controlled Substance Database was reviewed by me.  This patient was seen by Sallyanne Kuster, FNP-C in collaboration with Dr. Beverely Risen as a part of collaborative care agreement.   Sanya Kobrin R. Tedd Sias, MSN, FNP-C Internal medicine

## 2023-10-15 ENCOUNTER — Other Ambulatory Visit: Payer: Self-pay | Admitting: Nurse Practitioner

## 2023-10-15 DIAGNOSIS — Z794 Long term (current) use of insulin: Secondary | ICD-10-CM

## 2023-11-08 ENCOUNTER — Other Ambulatory Visit: Payer: Self-pay | Admitting: Nurse Practitioner

## 2023-11-09 ENCOUNTER — Telehealth: Payer: Self-pay

## 2023-11-09 NOTE — Telephone Encounter (Signed)
 Pt wife called he is coughing and wheezing advised her to go to urgent care due to flu is going and he unable to go urgent care then call us back

## 2023-11-12 ENCOUNTER — Encounter: Payer: Self-pay | Admitting: Nurse Practitioner

## 2023-11-12 ENCOUNTER — Ambulatory Visit (INDEPENDENT_AMBULATORY_CARE_PROVIDER_SITE_OTHER): Payer: Medicare Other | Admitting: Nurse Practitioner

## 2023-11-12 VITALS — BP 122/64 | HR 84 | Temp 98.3°F | Resp 16 | Ht 66.0 in | Wt 156.2 lb

## 2023-11-12 DIAGNOSIS — R051 Acute cough: Secondary | ICD-10-CM

## 2023-11-12 DIAGNOSIS — J449 Chronic obstructive pulmonary disease, unspecified: Secondary | ICD-10-CM

## 2023-11-12 DIAGNOSIS — J208 Acute bronchitis due to other specified organisms: Secondary | ICD-10-CM | POA: Diagnosis not present

## 2023-11-12 DIAGNOSIS — B9689 Other specified bacterial agents as the cause of diseases classified elsewhere: Secondary | ICD-10-CM

## 2023-11-12 MED ORDER — HYDROCOD POLI-CHLORPHE POLI ER 10-8 MG/5ML PO SUER: 5.0000 mL | Freq: Two times a day (BID) | ORAL | 0 refills | Status: AC | PRN

## 2023-11-12 MED ORDER — AZITHROMYCIN 250 MG PO TABS: 250.0000 mg | ORAL_TABLET | Freq: Every day | ORAL | 0 refills | Status: AC

## 2023-11-12 NOTE — Progress Notes (Signed)
 [facility] [address] [place], [place] [address]  Internal MEDICINE  Office Visit Note  Patient Name: Daniel Hodges  [id number]  [id number]  Date of Service: 11/12/2023  Chief Complaint  Patient presents with   Acute Visit    Coughing      HPI Estephan presents for an acute sick visit for acute cough --onset of worsening cough was about 10 days ago. --reports cough, runny nose, nasal congestion. Sore throat, ear pain, sinus drainage is thick and yellow, wheezing --tried ginger, water, and albuterol inhaler as needed.  Negative for covid     Current Medication:  Outpatient Encounter Medications as of 11/12/2023  Medication Sig   albuterol (VENTOLIN HFA) 108 (90 Base) MCG/ACT inhaler Inhale 2 puffs into the lungs every 6 (six) hours as needed for wheezing or shortness of breath.   amLODipine (NORVASC) 2.5 MG tablet TAKE 1 TABLET BY MOUTH DAILY   aspirin 81 MG tablet Take 81 mg by mouth daily.   atorvastatin (LIPITOR) 20 MG tablet TAKE 1 TABLET BY MOUTH AT  BEDTIME   azithromycin (ZITHROMAX) 250 MG tablet Take 1 tablet (250 mg total) by mouth daily for 10 days. Take with food   cholecalciferol (VITAMIN D3) 25 MCG (1000 UT) tablet Take 1,000 Units by mouth daily.   Continuous Glucose Sensor (FREESTYLE LIBRE 2 SENSOR) MISC CHANGE EVERY 14 DAYS   cyanocobalamin (VITAMIN B12) 1000 MCG tablet Take 1 tablet (1,000 mcg total) by mouth daily.   docusate sodium (COLACE) 50 MG capsule Take 1 capsule (50 mg total) by mouth 2 (two) times daily.   feeding supplement, GLUCERNA SHAKE, (GLUCERNA SHAKE) LIQD One a day   ferrous sulfate 324 MG TBEC Take by mouth.   Fluticasone-Umeclidin-Vilant (TRELEGY ELLIPTA) 100-62.5-25 MCG/ACT AEPB Inhale 1 puff into the lungs daily.   furosemide (LASIX) 20 MG tablet TAKE 1 TABLET BY MOUTH DAILY   gabapentin (NEURONTIN) 300 MG capsule TAKE 1 CAPSULE BY MOUTH  DAILY AND 1 CAPSULE BY  MOUTH AT NIGHT   glipiZIDE (GLUCOTROL XL) 5 MG 24 hr  tablet Take 1 tablet (5 mg total) by mouth daily with breakfast.   glipiZIDE (GLUCOTROL XL) 5 MG 24 hr tablet Take 1 tablet (5 mg total) by mouth daily with breakfast.   HUMALOG KWIKPEN 100 UNIT/ML KwikPen INJECT INSULIN INTO THE SKIN  WITH MEALS PER SLIDING SCALE  PROVIDED TO PATIENT. MAX DOSE  PER 24 HOURS: 42 UNITS   hydrocortisone 1 % ointment Apply small amount mix with lotion at night for leg rash   ipratropium-albuterol (DUONEB) 0.5-2.5 (3) MG/3ML SOLN USE 3 ML VIA NEBULIZER EVERY 6 HOURS AS NEEDED   loratadine (CLARITIN) 10 MG tablet Take 1 tablet (10 mg total) by mouth daily.   losartan (COZAAR) 25 MG tablet Take 1 tablet (25 mg total) by mouth daily.   Magnesium 250 MG TABS Take 250 mg by mouth 2 (two) times daily.   montelukast (SINGULAIR) 10 MG tablet Take 1 tablet (10 mg total) by mouth daily.   mupirocin ointment (BACTROBAN) 2 % Apply 1 Application topically 2 (two) times daily. To superficial scratches on legs until resolved.   niacin (NIASPAN) 500 MG CR tablet niacin ER 500 mg tablet,extended release 24 hr   senna (SENOKOT) 8.6 MG tablet Take 1 tablet (8.6 mg total) by mouth daily.   simethicone (MYLICON) 80 MG chewable tablet Chew 2 tablets (160 mg total) by mouth 2 (two) times daily.   Tiotropium Bromide-Olodaterol (STIOLTO RESPIMAT) 2.5-2.5 MCG/ACT AERS  Inhale 1 Inhalation into the lungs daily.   TOUJEO SOLOSTAR 300 UNIT/ML Solostar Pen INJECT SUBCUTANEOUSLY 18 UNITS  AT BEDTIME   [DISCONTINUED] chlorpheniramine-HYDROcodone (TUSSIONEX) 10-8 MG/5ML Take 5 mLs by mouth every 12 (twelve) hours as needed for cough.   chlorpheniramine-HYDROcodone (TUSSIONEX) 10-8 MG/5ML Take 5 mLs by mouth every 12 (twelve) hours as needed for cough.   No facility-administered encounter medications on file as of 11/12/2023.      Medical History: Past Medical History:  Diagnosis Date   Anginal pain (HCC)    Asthma    Coronary artery disease    Diabetes mellitus without complication (HCC)     Hyperlipidemia    Hypertension    Sleep apnea      Vital Signs: BP 122/64   Pulse 84   Temp 98.3 F (36.8 C)   Resp 16   Ht 5\' 6"  (1.676 m)   Wt 156 lb 3.2 oz (70.9 kg)   SpO2 96%   BMI 25.21 kg/m    Review of Systems  Constitutional:  Positive for fatigue. Negative for appetite change, chills and fever.  HENT:  Positive for congestion, ear pain, postnasal drip, rhinorrhea, sore throat and voice change.   Respiratory:  Positive for cough and wheezing. Negative for chest tightness and shortness of breath.   Gastrointestinal:  Negative for diarrhea, nausea and vomiting.  Neurological:  Negative for headaches.    Physical Exam Constitutional:      Appearance: Normal appearance. He is ill-appearing.  HENT:     Head: Normocephalic and atraumatic.     Right Ear: Tympanic membrane, ear canal and external ear normal.     Left Ear: Tympanic membrane, ear canal and external ear normal.     Nose: Congestion and rhinorrhea present.     Mouth/Throat:     Mouth: Mucous membranes are moist.     Pharynx: Posterior oropharyngeal erythema present.  Eyes:     Pupils: Pupils are equal, round, and reactive to light.  Cardiovascular:     Rate and Rhythm: Normal rate and regular rhythm.     Heart sounds: Normal heart sounds. No murmur heard. Pulmonary:     Effort: Pulmonary effort is normal. No respiratory distress.     Breath sounds: Normal breath sounds. No wheezing or rales.  Neurological:     Mental Status: He is alert and oriented to person, place, and time.  Psychiatric:        Mood and Affect: Mood normal.        Behavior: Behavior normal.       Assessment/Plan: 1. Acute bacterial bronchitis (Primary) 10 day zpak prescribed. Take until gone  - azithromycin (ZITHROMAX) 250 MG tablet; Take 1 tablet (250 mg total) by mouth daily for 10 days. Take with food  Dispense: 10 tablet; Refill: 0  2. Acute cough Cough syrup prescribed as needed - chlorpheniramine-HYDROcodone  (TUSSIONEX) 10-8 MG/5ML; Take 5 mLs by mouth every 12 (twelve) hours as needed for cough.  Dispense: 140 mL; Refill: 0  3. 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 Take antibiotic until gone.    General Counseling: Banjamin verbalizes understanding of the findings of todays visit and agrees with plan of treatment. I have discussed any further diagnostic evaluation that may be needed or ordered today. We also reviewed his medications today. he has been encouraged to call the office with any questions or concerns that should  arise related to todays visit.    Counseling:    No orders of the defined types were placed in this encounter.   Meds ordered this encounter  Medications   azithromycin (ZITHROMAX) 250 MG tablet    Sig: Take 1 tablet (250 mg total) by mouth daily for 10 days. Take with food    Dispense:  10 tablet    Refill:  0    Fill new script today.   chlorpheniramine-HYDROcodone (TUSSIONEX) 10-8 MG/5ML    Sig: Take 5 mLs by mouth every 12 (twelve) hours as needed for cough.    Dispense:  140 mL    Refill:  0    Return if symptoms worsen or fail to improve.  [place] Controlled Substance Database was reviewed by me for overdose risk score (ORS)  Time spent:20 Minutes Time spent with patient included reviewing progress notes, labs, imaging studies, and discussing plan for follow up.   This patient was seen by Sallyanne Kuster, FNP-C in collaboration with Dr. Beverely Risen as a part of collaborative care agreement.  Hobart Marte R. Tedd Sias, MSN, FNP-C Internal Medicine

## 2023-11-21 ENCOUNTER — Encounter: Payer: Self-pay | Admitting: Nurse Practitioner

## 2023-12-02 ENCOUNTER — Ambulatory Visit (INDEPENDENT_AMBULATORY_CARE_PROVIDER_SITE_OTHER): Payer: Medicare Other | Admitting: Nurse Practitioner

## 2023-12-02 ENCOUNTER — Encounter: Payer: Self-pay | Admitting: Nurse Practitioner

## 2023-12-02 VITALS — BP 134/66 | HR 80 | Temp 98.4°F | Resp 16 | Ht 66.0 in | Wt 158.0 lb

## 2023-12-02 DIAGNOSIS — J449 Chronic obstructive pulmonary disease, unspecified: Secondary | ICD-10-CM

## 2023-12-02 DIAGNOSIS — E1122 Type 2 diabetes mellitus with diabetic chronic kidney disease: Secondary | ICD-10-CM | POA: Diagnosis not present

## 2023-12-02 DIAGNOSIS — Z794 Long term (current) use of insulin: Secondary | ICD-10-CM

## 2023-12-02 DIAGNOSIS — E785 Hyperlipidemia, unspecified: Secondary | ICD-10-CM

## 2023-12-02 DIAGNOSIS — N1831 Chronic kidney disease, stage 3a: Secondary | ICD-10-CM | POA: Diagnosis not present

## 2023-12-02 DIAGNOSIS — Z79899 Other long term (current) drug therapy: Secondary | ICD-10-CM

## 2023-12-02 DIAGNOSIS — E1142 Type 2 diabetes mellitus with diabetic polyneuropathy: Secondary | ICD-10-CM | POA: Diagnosis not present

## 2023-12-02 DIAGNOSIS — E1159 Type 2 diabetes mellitus with other circulatory complications: Secondary | ICD-10-CM | POA: Diagnosis not present

## 2023-12-02 DIAGNOSIS — I152 Hypertension secondary to endocrine disorders: Secondary | ICD-10-CM

## 2023-12-02 DIAGNOSIS — E1169 Type 2 diabetes mellitus with other specified complication: Secondary | ICD-10-CM | POA: Diagnosis not present

## 2023-12-02 DIAGNOSIS — I10 Essential (primary) hypertension: Secondary | ICD-10-CM

## 2023-12-02 LAB — POCT GLYCOSYLATED HEMOGLOBIN (HGB A1C): Hemoglobin A1C: 8 % — AB (ref 4.0–5.6)

## 2023-12-02 MED ORDER — ATORVASTATIN CALCIUM 20 MG PO TABS: 20.0000 mg | ORAL_TABLET | Freq: Every day | ORAL | 3 refills | Status: DC

## 2023-12-02 MED ORDER — MONTELUKAST SODIUM 10 MG PO TABS: 10.0000 mg | ORAL_TABLET | Freq: Every day | ORAL | 2 refills | Status: DC

## 2023-12-02 MED ORDER — GABAPENTIN 300 MG PO CAPS: ORAL_CAPSULE | ORAL | 2 refills | Status: DC

## 2023-12-02 MED ORDER — AMLODIPINE BESYLATE 2.5 MG PO TABS: 2.5000 mg | ORAL_TABLET | Freq: Every day | ORAL | 2 refills | Status: DC

## 2023-12-02 MED ORDER — BREZTRI AEROSPHERE 160-9-4.8 MCG/ACT IN AERO
2.0000 | INHALATION_SPRAY | Freq: Two times a day (BID) | RESPIRATORY_TRACT | 11 refills | Status: DC
Start: 2023-12-02 — End: 2024-07-14

## 2023-12-02 NOTE — Progress Notes (Signed)
 [facility] [address] [place], [place] [address]  Internal MEDICINE  Office Visit Note  Patient Name: Daniel Hodges  [id number]  [id number]  Date of Service: 12/02/2023  Chief Complaint  Patient presents with   Diabetes   Hypertension   Hyperlipidemia   Follow-up    HPI Zyere presents for a follow-up visit for diabetes, hypertension, high cholesterol, neuropathy, and COPD Diabetes -- A1c is improving down to 8.0 today from 8.8 previously. Taking toujeo  and glipizide .  Hypertension -- controlled with amlodipine , furosemide , and losartan  High cholesterol -- taking atorvastatin   Neuropathy -- taking gabapentin  COPD and asthma -- breztri  is helping. Also taking montelukast      Current Medication: Outpatient Encounter Medications as of 12/02/2023  Medication Sig   albuterol  (VENTOLIN  HFA) 108 (90 Base) MCG/ACT inhaler Inhale 2 puffs into the lungs every 6 (six) hours as needed for wheezing or shortness of breath.   aspirin  81 MG tablet Take 81 mg by mouth daily.   budeson-glycopyrrolate -formoterol  (BREZTRI  AEROSPHERE) 160-9-4.8 MCG/ACT AERO Inhale 2 puffs into the lungs 2 (two) times daily.   chlorpheniramine-HYDROcodone (TUSSIONEX) 10-8 MG/5ML Take 5 mLs by mouth every 12 (twelve) hours as needed for cough.   cholecalciferol  (VITAMIN D3) 25 MCG (1000 UT) tablet Take 1,000 Units by mouth daily.   Continuous Glucose Sensor (FREESTYLE LIBRE 2 SENSOR) MISC CHANGE EVERY 14 DAYS   cyanocobalamin  (VITAMIN B12) 1000 MCG tablet Take 1 tablet (1,000 mcg total) by mouth daily.   docusate sodium  (COLACE) 50 MG capsule Take 1 capsule (50 mg total) by mouth 2 (two) times daily.   feeding supplement, GLUCERNA SHAKE, (GLUCERNA SHAKE) LIQD One a day   ferrous sulfate  324 MG TBEC Take by mouth.   Fluticasone -Umeclidin-Vilant (TRELEGY ELLIPTA ) 100-62.5-25 MCG/ACT AEPB Inhale 1 puff into the lungs daily.   furosemide  (LASIX ) 20 MG tablet TAKE 1 TABLET BY MOUTH DAILY    glipiZIDE  (GLUCOTROL  XL) 5 MG 24 hr tablet Take 1 tablet (5 mg total) by mouth daily with breakfast.   HUMALOG  KWIKPEN 100 UNIT/ML KwikPen INJECT INSULIN  INTO THE SKIN  WITH MEALS PER SLIDING SCALE  PROVIDED TO PATIENT. MAX DOSE  PER 24 HOURS: 42 UNITS   hydrocortisone  1 % ointment Apply small amount mix with lotion at night for leg rash   ipratropium-albuterol  (DUONEB) 0.5-2.5 (3) MG/3ML SOLN USE 3 ML VIA NEBULIZER EVERY 6 HOURS AS NEEDED   loratadine  (CLARITIN ) 10 MG tablet Take 1 tablet (10 mg total) by mouth daily.   losartan  (COZAAR ) 25 MG tablet Take 1 tablet (25 mg total) by mouth daily.   Magnesium  250 MG TABS Take 250 mg by mouth 2 (two) times daily.   mupirocin  ointment (BACTROBAN ) 2 % Apply 1 Application topically 2 (two) times daily. To superficial scratches on legs until resolved.   niacin  (NIASPAN ) 500 MG CR tablet niacin  ER 500 mg tablet,extended release 24 hr   senna (SENOKOT) 8.6 MG tablet Take 1 tablet (8.6 mg total) by mouth daily.   simethicone  (MYLICON) 80 MG chewable tablet Chew 2 tablets (160 mg total) by mouth 2 (two) times daily.   Tiotropium Bromide-Olodaterol (STIOLTO RESPIMAT ) 2.5-2.5 MCG/ACT AERS Inhale 1 Inhalation into the lungs daily.   TOUJEO  SOLOSTAR 300 UNIT/ML Solostar Pen INJECT SUBCUTANEOUSLY 18 UNITS  AT BEDTIME   [DISCONTINUED] amLODipine  (NORVASC ) 2.5 MG tablet TAKE 1 TABLET BY MOUTH DAILY   [DISCONTINUED] atorvastatin  (LIPITOR) 20 MG tablet TAKE 1 TABLET BY MOUTH AT  BEDTIME   [DISCONTINUED] gabapentin  (NEURONTIN ) 300 MG capsule  TAKE 1 CAPSULE BY MOUTH  DAILY AND 1 CAPSULE BY  MOUTH AT NIGHT   [DISCONTINUED] glipiZIDE  (GLUCOTROL  XL) 5 MG 24 hr tablet Take 1 tablet (5 mg total) by mouth daily with breakfast.   [DISCONTINUED] montelukast  (SINGULAIR ) 10 MG tablet Take 1 tablet (10 mg total) by mouth daily.   amLODipine  (NORVASC ) 2.5 MG tablet Take 1 tablet (2.5 mg total) by mouth daily.   atorvastatin  (LIPITOR) 20 MG tablet Take 1 tablet (20 mg total) by  mouth at bedtime.   gabapentin  (NEURONTIN ) 300 MG capsule TAKE 1 CAPSULE BY MOUTH  DAILY AND 1 CAPSULE BY  MOUTH AT NIGHT   montelukast  (SINGULAIR ) 10 MG tablet Take 1 tablet (10 mg total) by mouth daily.   No facility-administered encounter medications on file as of 12/02/2023.    Surgical History: Past Surgical History:  Procedure Laterality Date   APPENDECTOMY     COLONOSCOPY WITH PROPOFOL  N/A 06/11/2015   Procedure: COLONOSCOPY WITH PROPOFOL ;  Surgeon: Cassie Click, MD;  Location: [facility] ENDOSCOPY;  Service: Endoscopy;  Laterality: N/A;   CORONARY ARTERY BYPASS GRAFT     HERNIA REPAIR     TEE WITHOUT CARDIOVERSION     TRACHEOSTOMY     VASCULAR SURGERY      Medical History: Past Medical History:  Diagnosis Date   Anginal pain (HCC)    Asthma    Coronary artery disease    Diabetes mellitus without complication (HCC)    Hyperlipidemia    Hypertension    Sleep apnea     Family History: Family History  Problem Relation Age of Onset   Cancer Sister    Diabetes Daughter    Diabetes Son     Social History   Socioeconomic History   Marital status: Married    Spouse name: Not on file   Number of children: Not on file   Years of education: Not on file   Highest education level: Not on file  Occupational History   Not on file  Tobacco Use   Smoking status: Never   Smokeless tobacco: Never  Vaping Use   Vaping status: Never Used  Substance and Sexual Activity   Alcohol use: No   Drug use: No   Sexual activity: Not on file  Other Topics Concern   Not on file  Social History Narrative   Not on file   Social Drivers of Health   Financial Resource Strain: Not on file  Food Insecurity: No Food Insecurity (10/31/2022)   Hunger Vital Sign    Worried About Running Out of Food in the Last Year: Never true    Ran Out of Food in the Last Year: Never true  Transportation Needs: No Transportation Needs (10/31/2022)   PRAPARE - Administrator, Civil Service  (Medical): No    Lack of Transportation (Non-Medical): No  Physical Activity: Not on file  Stress: Not on file  Social Connections: Not on file  Intimate Partner Violence: Not At Risk (10/31/2022)   Humiliation, Afraid, Rape, and Kick questionnaire    Fear of Current or Ex-Partner: No    Emotionally Abused: No    Physically Abused: No    Sexually Abused: No      Review of Systems  Constitutional:  Negative for activity change, appetite change, chills, fatigue, fever and unexpected weight change.  HENT: Negative.  Negative for congestion, ear pain, rhinorrhea, sore throat and trouble swallowing.   Eyes: Negative.   Respiratory:  Positive for cough (  chronic cough due to COPD/asthma). Negative for chest tightness, shortness of breath and wheezing.   Cardiovascular: Negative.  Negative for chest pain and palpitations.  Gastrointestinal: Negative.  Negative for abdominal pain, blood in stool, constipation, diarrhea, nausea and vomiting.  Endocrine: Negative.   Genitourinary: Negative.  Negative for difficulty urinating, dysuria, frequency, hematuria and urgency.  Musculoskeletal: Negative.  Negative for arthralgias, back pain, joint swelling, myalgias and neck pain.  Skin: Negative.  Negative for rash and wound.  Allergic/Immunologic: Negative.  Negative for immunocompromised state.  Neurological: Negative.  Negative for dizziness, seizures, numbness and headaches.  Hematological: Negative.   Psychiatric/Behavioral: Negative.  Negative for behavioral problems, self-injury and suicidal ideas. The patient is not nervous/anxious.     Vital Signs: BP 134/66   Pulse 80   Temp 98.4 F (36.9 C)   Resp 16   Ht 5\' 6"  (1.676 m)   Wt 158 lb (71.7 kg)   SpO2 98%   BMI 25.50 kg/m    Physical Exam Vitals reviewed.  Constitutional:      Appearance: Normal appearance.  HENT:     Head: Normocephalic and atraumatic.  Eyes:     Pupils: Pupils are equal, round, and reactive to light.   Cardiovascular:     Rate and Rhythm: Normal rate and regular rhythm.     Heart sounds: Normal heart sounds. No murmur heard. Pulmonary:     Effort: Pulmonary effort is normal. No respiratory distress.     Breath sounds: Normal breath sounds. No wheezing.  Neurological:     Mental Status: He is alert and oriented to person, place, and time.  Psychiatric:        Mood and Affect: Mood normal.        Behavior: Behavior normal.        Assessment/Plan: 1. COPD mixed type (HCC) (Primary) Use breztri  inhaler and continue montelukast  as prescribed.  - montelukast  (SINGULAIR ) 10 MG tablet; Take 1 tablet (10 mg total) by mouth daily.  Dispense: 100 tablet; Refill: 2 - budeson-glycopyrrolate -formoterol  (BREZTRI  AEROSPHERE) 160-9-4.8 MCG/ACT AERO; Inhale 2 puffs into the lungs 2 (two) times daily.  Dispense: 10.7 g; Refill: 11  2. Type 2 diabetes mellitus with stage 3a chronic kidney disease, with long-term current use of insulin  (HCC) A1c has improved to 8.0 but is still elevated. Continue toujeo  and glipizide  as prescribed  - POCT glycosylated hemoglobin (Hb A1C)  3. Hypertension associated with type 2 diabetes mellitus (HCC) Stable, continue amlodipine , losartan  and furosemide  as prescribed.  - amLODipine  (NORVASC ) 2.5 MG tablet; Take 1 tablet (2.5 mg total) by mouth daily.  Dispense: 100 tablet; Refill: 2  4. Hyperlipidemia associated with type 2 diabetes mellitus (HCC) Continue atorvastatin  as prescribed  - atorvastatin  (LIPITOR) 20 MG tablet; Take 1 tablet (20 mg total) by mouth at bedtime.  Dispense: 90 tablet; Refill: 3  5. Diabetic polyneuropathy associated with type 2 diabetes mellitus (HCC) Continue gabapentin  as prescribed - POCT glycosylated hemoglobin (Hb A1C) - gabapentin  (NEURONTIN ) 300 MG capsule; TAKE 1 CAPSULE BY MOUTH  DAILY AND 1 CAPSULE BY  MOUTH AT NIGHT  Dispense: 200 capsule; Refill: 2   General Counseling: Yuji verbalizes understanding of the findings of  todays visit and agrees with plan of treatment. I have discussed any further diagnostic evaluation that may be needed or ordered today. We also reviewed his medications today. he has been encouraged to call the office with any questions or concerns that should arise related to todays visit.    Orders  Placed This Encounter  Procedures   POCT glycosylated hemoglobin (Hb A1C)    Meds ordered this encounter  Medications   amLODipine  (NORVASC ) 2.5 MG tablet    Sig: Take 1 tablet (2.5 mg total) by mouth daily.    Dispense:  100 tablet    Refill:  2    Please send a replace/new response with 100-Day Supply if appropriate to maximize member benefit. Requesting 1 year supply.   atorvastatin  (LIPITOR) 20 MG tablet    Sig: Take 1 tablet (20 mg total) by mouth at bedtime.    Dispense:  90 tablet    Refill:  3    Requesting 1 year supply   gabapentin  (NEURONTIN ) 300 MG capsule    Sig: TAKE 1 CAPSULE BY MOUTH  DAILY AND 1 CAPSULE BY  MOUTH AT NIGHT    Dispense:  200 capsule    Refill:  2    Requesting 1 year supply   montelukast  (SINGULAIR ) 10 MG tablet    Sig: Take 1 tablet (10 mg total) by mouth daily.    Dispense:  100 tablet    Refill:  2    Please send a replace/new response with 100-Day Supply if appropriate to maximize member benefit. Requesting 1 year supply.   budeson-glycopyrrolate -formoterol  (BREZTRI  AEROSPHERE) 160-9-4.8 MCG/ACT AERO    Sig: Inhale 2 puffs into the lungs 2 (two) times daily.    Dispense:  10.7 g    Refill:  11    Please see if insurance will cover    Return in about 4 months (around 04/02/2024) for F/U, Recheck A1C, Rozetta Stumpp PCP.   Total time spent:30 Minutes Time spent includes review of chart, medications, test results, and follow up plan with the patient.   [place] Controlled Substance Database was reviewed by me.  This patient was seen by Laurence Pons, FNP-C in collaboration with Dr. Verneta Gone as a part of collaborative care agreement.   Reyanna Baley R.  Bobbi Burow, MSN, FNP-C Internal medicine

## 2024-01-03 ENCOUNTER — Other Ambulatory Visit: Payer: Self-pay | Admitting: Nurse Practitioner

## 2024-01-18 ENCOUNTER — Encounter: Payer: Self-pay | Admitting: Nurse Practitioner

## 2024-01-18 DIAGNOSIS — E1142 Type 2 diabetes mellitus with diabetic polyneuropathy: Secondary | ICD-10-CM | POA: Insufficient documentation

## 2024-02-25 DIAGNOSIS — I350 Nonrheumatic aortic (valve) stenosis: Secondary | ICD-10-CM | POA: Diagnosis not present

## 2024-03-06 ENCOUNTER — Other Ambulatory Visit: Payer: Self-pay | Admitting: Nurse Practitioner

## 2024-03-06 DIAGNOSIS — E1142 Type 2 diabetes mellitus with diabetic polyneuropathy: Secondary | ICD-10-CM

## 2024-03-07 DIAGNOSIS — Z951 Presence of aortocoronary bypass graft: Secondary | ICD-10-CM | POA: Diagnosis not present

## 2024-03-07 DIAGNOSIS — E782 Mixed hyperlipidemia: Secondary | ICD-10-CM | POA: Diagnosis not present

## 2024-03-07 DIAGNOSIS — I2510 Atherosclerotic heart disease of native coronary artery without angina pectoris: Secondary | ICD-10-CM | POA: Diagnosis not present

## 2024-03-07 DIAGNOSIS — I10 Essential (primary) hypertension: Secondary | ICD-10-CM | POA: Diagnosis not present

## 2024-03-07 DIAGNOSIS — I350 Nonrheumatic aortic (valve) stenosis: Secondary | ICD-10-CM | POA: Diagnosis not present

## 2024-03-22 ENCOUNTER — Telehealth: Payer: Self-pay | Admitting: Nurse Practitioner

## 2024-03-22 NOTE — Telephone Encounter (Signed)
 Left vm to confirm 03/29/24 appointment-Toni

## 2024-03-29 ENCOUNTER — Ambulatory Visit: Payer: Medicare Other | Admitting: Nurse Practitioner

## 2024-03-29 ENCOUNTER — Encounter: Payer: Self-pay | Admitting: Nurse Practitioner

## 2024-03-29 VITALS — BP 135/65 | HR 77 | Temp 98.3°F | Resp 16 | Ht 66.0 in | Wt 156.8 lb

## 2024-03-29 DIAGNOSIS — Z0000 Encounter for general adult medical examination without abnormal findings: Secondary | ICD-10-CM | POA: Diagnosis not present

## 2024-03-29 DIAGNOSIS — I152 Hypertension secondary to endocrine disorders: Secondary | ICD-10-CM

## 2024-03-29 DIAGNOSIS — E1159 Type 2 diabetes mellitus with other circulatory complications: Secondary | ICD-10-CM

## 2024-03-29 DIAGNOSIS — E785 Hyperlipidemia, unspecified: Secondary | ICD-10-CM

## 2024-03-29 DIAGNOSIS — E1169 Type 2 diabetes mellitus with other specified complication: Secondary | ICD-10-CM

## 2024-03-29 DIAGNOSIS — E1142 Type 2 diabetes mellitus with diabetic polyneuropathy: Secondary | ICD-10-CM | POA: Diagnosis not present

## 2024-03-29 DIAGNOSIS — E538 Deficiency of other specified B group vitamins: Secondary | ICD-10-CM | POA: Diagnosis not present

## 2024-03-29 DIAGNOSIS — N1831 Chronic kidney disease, stage 3a: Secondary | ICD-10-CM | POA: Diagnosis not present

## 2024-03-29 DIAGNOSIS — E559 Vitamin D deficiency, unspecified: Secondary | ICD-10-CM

## 2024-03-29 DIAGNOSIS — J449 Chronic obstructive pulmonary disease, unspecified: Secondary | ICD-10-CM | POA: Diagnosis not present

## 2024-03-29 DIAGNOSIS — Z794 Long term (current) use of insulin: Secondary | ICD-10-CM

## 2024-03-29 DIAGNOSIS — E1122 Type 2 diabetes mellitus with diabetic chronic kidney disease: Secondary | ICD-10-CM | POA: Diagnosis not present

## 2024-03-29 LAB — POCT GLYCOSYLATED HEMOGLOBIN (HGB A1C): Hemoglobin A1C: 9.3 % — AB (ref 4.0–5.6)

## 2024-03-29 NOTE — Progress Notes (Signed)
 [facility] [address] [place], [place] [address]  Internal MEDICINE  Office Visit Note  Patient Name: Daniel Hodges  [id number]  [id number]  Date of Service: 03/29/2024  Chief Complaint  Patient presents with   Diabetes   Hypertension   Hyperlipidemia   Medicare Wellness    HPI Daniel Hodges presents for a medicare annual wellness visit.  Well-appearing 83 y.o. male with diabetes, OSA, chronic obstructive bronchitis, hypertension, CAD, allergic rhinitis, and high cholesterol.  Routine CRC screening: discontinued, aged out Eye exam: overdue foot exam: done  Labs: due for routine labs  New or worsening pain: none  Other concerns: none  A1c is elevated at 9.3 today. Tried mounjaro  before which worked well but was too expensive on his insurance.     03/29/2024    9:35 AM 03/27/2023   10:32 AM 03/21/2022    8:57 AM  MMSE - Mini Mental State Exam  Orientation to time 5 5 5   Orientation to Place 5 5 5   Registration 3 3 3   Attention/ Calculation 5 5 5   Recall 3 3 3   Language- name 2 objects 0 2 2  Language- repeat 1 1 1   Language- follow 3 step command 3 3 3   Language- read & follow direction 1 1 1   Write a sentence 1 1 1   Copy design 0 1 1  Total score 27 30 30     Functional Status Survey: Is the patient deaf or have difficulty hearing?: Yes Does the patient have difficulty seeing, even when wearing glasses/contacts?: No Does the patient have difficulty concentrating, remembering, or making decisions?: Yes Does the patient have difficulty walking or climbing stairs?: Yes Does the patient have difficulty dressing or bathing?: Yes Does the patient have difficulty doing errands alone such as visiting a doctor's office or shopping?: No     12/18/2021   10:59 AM 03/21/2022    8:57 AM 10/10/2022    9:24 AM 03/27/2023   10:30 AM 03/29/2024    9:34 AM  Fall Risk  Falls in the past year? 0 0 0 0 0  Was there an injury with Fall?   0 0 0  Fall Risk Category  Calculator   0 0 0  (RETIRED) Patient Fall Risk Level  Low fall risk      Patient at Risk for Falls Due to  No Fall Risks No Fall Risks No Fall Risks No Fall Risks  Fall risk Follow up  Falls evaluation completed  Falls evaluation completed Falls evaluation completed Falls evaluation completed     Data saved with a previous flowsheet row definition       03/29/2024    9:34 AM  Depression screen PHQ 2/9  Decreased Interest 0  Down, Depressed, Hopeless 0  PHQ - 2 Score 0       Current Medication: Outpatient Encounter Medications as of 03/29/2024  Medication Sig   albuterol  (VENTOLIN  HFA) 108 (90 Base) MCG/ACT inhaler Inhale 2 puffs into the lungs every 6 (six) hours as needed for wheezing or shortness of breath.   amLODipine  (NORVASC ) 2.5 MG tablet Take 1 tablet (2.5 mg total) by mouth daily.   aspirin  81 MG tablet Take 81 mg by mouth daily.   atorvastatin  (LIPITOR) 20 MG tablet Take 1 tablet (20 mg total) by mouth at bedtime.   budeson-glycopyrrolate -formoterol  (BREZTRI  AEROSPHERE) 160-9-4.8 MCG/ACT AERO Inhale 2 puffs into the lungs 2 (two) times daily.   chlorpheniramine-HYDROcodone (TUSSIONEX) 10-8 MG/5ML Take 5 mLs by mouth every  12 (twelve) hours as needed for cough.   cholecalciferol  (VITAMIN D3) 25 MCG (1000 UT) tablet Take 1,000 Units by mouth daily.   Continuous Glucose Sensor (FREESTYLE LIBRE 2 SENSOR) MISC CHANGE EVERY 14 DAYS   cyanocobalamin  (VITAMIN B12) 1000 MCG tablet Take 1 tablet (1,000 mcg total) by mouth daily.   docusate sodium  (COLACE) 50 MG capsule Take 1 capsule (50 mg total) by mouth 2 (two) times daily.   feeding supplement, GLUCERNA SHAKE, (GLUCERNA SHAKE) LIQD One a day   ferrous sulfate  324 MG TBEC Take by mouth.   Fluticasone -Umeclidin-Vilant (TRELEGY ELLIPTA ) 100-62.5-25 MCG/ACT AEPB Inhale 1 puff into the lungs daily.   furosemide  (LASIX ) 20 MG tablet TAKE 1 TABLET BY MOUTH DAILY   gabapentin  (NEURONTIN ) 300 MG capsule TAKE 1 CAPSULE BY MOUTH DAILY   AND 1 CAPSULE BY MOUTH AT NIGHT   glipiZIDE  (GLUCOTROL  XL) 5 MG 24 hr tablet TAKE 1 TABLET BY MOUTH DAILY  WITH BREAKFAST (DISCONTINUE  MOUNJARO )   HUMALOG  KWIKPEN 100 UNIT/ML KwikPen INJECT INSULIN  INTO THE SKIN  WITH MEALS PER SLIDING SCALE  PROVIDED TO PATIENT. MAX DOSE  PER 24 HOURS: 42 UNITS   hydrocortisone  1 % ointment Apply small amount mix with lotion at night for leg rash   ipratropium-albuterol  (DUONEB) 0.5-2.5 (3) MG/3ML SOLN USE 3 ML VIA NEBULIZER EVERY 6 HOURS AS NEEDED   loratadine  (CLARITIN ) 10 MG tablet Take 1 tablet (10 mg total) by mouth daily.   losartan  (COZAAR ) 25 MG tablet Take 1 tablet (25 mg total) by mouth daily.   Magnesium  250 MG TABS Take 250 mg by mouth 2 (two) times daily.   montelukast  (SINGULAIR ) 10 MG tablet Take 1 tablet (10 mg total) by mouth daily.   mupirocin  ointment (BACTROBAN ) 2 % Apply 1 Application topically 2 (two) times daily. To superficial scratches on legs until resolved.   niacin  (NIASPAN ) 500 MG CR tablet niacin  ER 500 mg tablet,extended release 24 hr   senna (SENOKOT) 8.6 MG tablet Take 1 tablet (8.6 mg total) by mouth daily.   simethicone  (MYLICON) 80 MG chewable tablet Chew 2 tablets (160 mg total) by mouth 2 (two) times daily.   Tiotropium Bromide-Olodaterol (STIOLTO RESPIMAT ) 2.5-2.5 MCG/ACT AERS Inhale 1 Inhalation into the lungs daily.   TOUJEO  SOLOSTAR 300 UNIT/ML Solostar Pen INJECT SUBCUTANEOUSLY 18 UNITS  AT BEDTIME   No facility-administered encounter medications on file as of 03/29/2024.    Surgical History: Past Surgical History:  Procedure Laterality Date   APPENDECTOMY     COLONOSCOPY WITH PROPOFOL  N/A 06/11/2015   Procedure: COLONOSCOPY WITH PROPOFOL ;  Surgeon: Daniel ONEIDA Holmes, MD;  [facility] ENDOSCOPY;  Service: Endoscopy;  Laterality: N/A;   CORONARY ARTERY BYPASS GRAFT     HERNIA REPAIR     TEE WITHOUT CARDIOVERSION     TRACHEOSTOMY     VASCULAR SURGERY      Medical History: Past Medical History:  Diagnosis  Date   Anginal pain (HCC)    Asthma    Coronary artery disease    Diabetes mellitus without complication (HCC)    Hyperlipidemia    Hypertension    Sleep apnea     Family History: Family History  Problem Relation Age of Onset   Cancer Sister    Diabetes Daughter    Diabetes Son     Social History   Socioeconomic History   Marital status: Married    Spouse name: Not on file   Number of children: Not on file   Years  of education: Not on file   Highest education level: Not on file  Occupational History   Not on file  Tobacco Use   Smoking status: Never   Smokeless tobacco: Never  Vaping Use   Vaping status: Never Used  Substance and Sexual Activity   Alcohol use: No   Drug use: No   Sexual activity: Not on file  Other Topics Concern   Not on file  Social History Narrative   Not on file   Social Drivers of Health   Financial Resource Strain: Not on file  Food Insecurity: No Food Insecurity (10/31/2022)   Hunger Vital Sign    Worried About Running Out of Food in the Last Year: Never true    Ran Out of Food in the Last Year: Never true  Transportation Needs: No Transportation Needs (10/31/2022)   PRAPARE - Administrator, Civil Service (Medical): No    Lack of Transportation (Non-Medical): No  Physical Activity: Not on file  Stress: Not on file  Social Connections: Not on file  Intimate Partner Violence: Not At Risk (10/31/2022)   Humiliation, Afraid, Rape, and Kick questionnaire    Fear of Current or Ex-Partner: No    Emotionally Abused: No    Physically Abused: No    Sexually Abused: No      Review of Systems  Constitutional:  Negative for activity change, appetite change, chills, fatigue, fever and unexpected weight change.  HENT: Negative.  Negative for congestion, ear pain, rhinorrhea, sore throat and trouble swallowing.   Eyes: Negative.   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 Negative for chest tightness,  shortness of breath and wheezing.   Cardiovascular: Negative.  Negative for chest pain.  Gastrointestinal: Negative.  Negative for abdominal pain, blood in stool, constipation, diarrhea, nausea and vomiting.  Endocrine: Negative.   Genitourinary: Negative.  Negative for difficulty urinating, dysuria, frequency, hematuria and urgency.  Musculoskeletal: Negative.  Negative for arthralgias, back pain, joint swelling, myalgias and neck pain.  Skin: Negative.  Negative for rash and wound.  Allergic/Immunologic: Negative.  Negative for immunocompromised state.  Neurological: Negative.  Negative for dizziness, seizures, numbness and headaches.  Hematological: Negative.   Psychiatric/Behavioral: Negative.  Negative for behavioral problems, self-injury and suicidal ideas. The patient is not nervous/anxious.     Vital Signs: BP (!) 140/68   Pulse 77   Temp 98.3 F (36.8 C)   Resp 16   Ht 5' 6 (1.676 m)   Wt 156 lb 12.8 oz (71.1 kg)   SpO2 97%   BMI 25.31 kg/m    Physical Exam Vitals reviewed.  Constitutional:      General: He is awake. He is not in acute distress.    Appearance: Normal appearance. He is well-developed, well-groomed and overweight. He is not ill-appearing or diaphoretic.  HENT:     Head: Normocephalic and atraumatic.     Right Ear: Tympanic membrane, ear canal and external ear normal. There is no impacted cerumen.     Left Ear: Tympanic membrane, ear canal and external ear normal. There is no impacted cerumen.     Nose: Nose normal. No congestion or rhinorrhea.     Mouth/Throat:     Lips: Pink.     Mouth: Mucous membranes are moist.     Pharynx: Oropharynx is clear. Uvula midline. No oropharyngeal exudate or posterior oropharyngeal erythema.  Eyes:     General: Lids are normal. Vision grossly intact. Gaze aligned appropriately. No  scleral icterus.       Right eye: No discharge.        Left eye: No discharge.     Extraocular Movements: Extraocular movements intact.      Conjunctiva/sclera: Conjunctivae normal.     Pupils: Pupils are equal, round, and reactive to light.     Funduscopic exam:    Right eye: Red reflex present.        Left eye: Red reflex present. Neck:     Thyroid : No thyromegaly.     Vascular: No JVD.     Trachea: Trachea and phonation normal. No tracheal deviation.  Cardiovascular:     Rate and Rhythm: Normal rate and regular rhythm.     Pulses: Normal pulses.          Dorsalis pedis pulses are 2+ on the right side and 2+ on the left side.       Posterior tibial pulses are 2+ on the right side and 2+ on the left side.     Heart sounds: Normal heart sounds, S1 normal and S2 normal. No murmur heard.    No friction rub. No gallop.  Pulmonary:     Effort: Pulmonary effort is normal. No accessory muscle usage or respiratory distress.     Breath sounds: Normal breath sounds and air entry. No stridor. No wheezing or rales.  Chest:     Chest wall: No tenderness.  Abdominal:     General: Bowel sounds are normal. There is no distension.     Palpations: Abdomen is soft. There is no mass.     Tenderness: There is no abdominal tenderness. There is no guarding or rebound.  Musculoskeletal:        General: No tenderness or deformity. Normal range of motion.     Cervical back: Normal range of motion and neck supple.     Right lower leg: No edema.     Left lower leg: No edema.     Right foot: Normal range of motion. No deformity, bunion, Charcot foot, foot drop or prominent metatarsal heads.     Left foot: Normal range of motion. No deformity, bunion, Charcot foot, foot drop or prominent metatarsal heads.  Feet:     Right foot:     Protective Sensation: 6 sites tested.  6 sites sensed.     Skin integrity: Dry skin present. No ulcer, blister, skin breakdown, erythema, warmth, callus or fissure.     Toenail Condition: Right toenails are abnormally thick and long.     Left foot:     Protective Sensation: 6 sites tested.  6 sites sensed.      Skin integrity: Dry skin present. No ulcer, blister, skin breakdown, erythema, warmth, callus or fissure.     Toenail Condition: Left toenails are abnormally thick and long.  Lymphadenopathy:     Cervical: No cervical adenopathy.  Skin:    General: Skin is warm and dry.     Capillary Refill: Capillary refill takes less than 2 seconds.     Coloration: Skin is not pale.     Findings: No erythema or rash.  Neurological:     Mental Status: He is alert and oriented to person, place, and time.     Cranial Nerves: No cranial nerve deficit.     Motor: No abnormal muscle tone.     Coordination: Coordination normal.     Gait: Gait normal.     Deep Tendon Reflexes: Reflexes are normal and symmetric.  Psychiatric:  Mood and Affect: Mood and affect normal.        Behavior: Behavior normal. Behavior is cooperative.        Thought Content: Thought content normal.        Judgment: Judgment normal.        Assessment/Plan: 1. Encounter for subsequent annual wellness visit (AWV) in Medicare patient (Primary) Age-appropriate preventive screenings and vaccinations discussed. Routine labs for health maintenance will be ordered. PHM updated.   2. COPD mixed type (HCC) Continue inhalers as prescribed.   3. Type 2 diabetes mellitus with stage 3a chronic kidney disease, with long-term current use of insulin  (HCC) A1c is elevated at 9.3. continue medications as prescribed. Mounjaro  is too expensive. Samples of ozempic  given to patient, first injection done in the office today. Application completed for PAP with [employer] for ozempic .  - POCT glycosylated hemoglobin (Hb A1C)  4. Hypertension associated with type 2 diabetes mellitus (HCC) Stable, continue medications as prescribed.   5. Hyperlipidemia associated with type 2 diabetes mellitus (HCC) Continue atorvastatin  as prescribed.   6. Stage 3a chronic kidney disease (HCC) Continue losartan  as prescribed, will repeat labs   7. Diabetic  polyneuropathy associated with type 2 diabetes mellitus (HCC) Continue medications as prescribed.   8. Vitamin D  deficiency Will order repeat labs   9. B12 deficiency Will order labs     General Counseling: Shakir verbalizes understanding of the findings of todays visit and agrees with plan of treatment. I have discussed any further diagnostic evaluation that may be needed or ordered today. We also reviewed his medications today. he has been encouraged to call the office with any questions or concerns that should arise related to todays visit.    Orders Placed This Encounter  Procedures   POCT glycosylated hemoglobin (Hb A1C)    No orders of the defined types were placed in this encounter.   Return in about 1 month (around 04/29/2024) for F/U, eval new med, Kegan Shepardson PCP.   Total time spent:30 Minutes Time spent includes review of chart, medications, test results, and follow up plan with the patient.   [place] Controlled Substance Database was reviewed by me.  This patient was seen by Mardy Maxin, FNP-C in collaboration with Dr. Sigrid Bathe as a part of collaborative care agreement.  Arnaldo Heffron R. Maxin, MSN, FNP-C Internal medicine

## 2024-04-02 ENCOUNTER — Encounter: Payer: Self-pay | Admitting: Nurse Practitioner

## 2024-04-02 MED ORDER — SEMAGLUTIDE (1 MG/DOSE) 4 MG/3ML ~~LOC~~ SOPN
1.0000 mg | PEN_INJECTOR | SUBCUTANEOUS | Status: DC
Start: 2024-04-02 — End: 2024-07-26

## 2024-04-08 ENCOUNTER — Other Ambulatory Visit: Payer: Self-pay | Admitting: Nurse Practitioner

## 2024-04-08 DIAGNOSIS — E559 Vitamin D deficiency, unspecified: Secondary | ICD-10-CM | POA: Diagnosis not present

## 2024-04-08 DIAGNOSIS — E782 Mixed hyperlipidemia: Secondary | ICD-10-CM | POA: Diagnosis not present

## 2024-04-08 DIAGNOSIS — E538 Deficiency of other specified B group vitamins: Secondary | ICD-10-CM | POA: Diagnosis not present

## 2024-04-08 DIAGNOSIS — E1165 Type 2 diabetes mellitus with hyperglycemia: Secondary | ICD-10-CM | POA: Diagnosis not present

## 2024-04-09 LAB — CBC WITH DIFFERENTIAL/PLATELET
Basophils Absolute: 0 x10E3/uL (ref 0.0–0.2)
Basos: 1 %
EOS (ABSOLUTE): 0.1 x10E3/uL (ref 0.0–0.4)
Eos: 2 %
Hematocrit: 42.9 % (ref 37.5–51.0)
Hemoglobin: 13.2 g/dL (ref 13.0–17.7)
Immature Grans (Abs): 0 x10E3/uL (ref 0.0–0.1)
Immature Granulocytes: 0 %
Lymphocytes Absolute: 0.6 x10E3/uL — ABNORMAL LOW (ref 0.7–3.1)
Lymphs: 10 %
MCH: 27.3 pg (ref 26.6–33.0)
MCHC: 30.8 g/dL — ABNORMAL LOW (ref 31.5–35.7)
MCV: 89 fL (ref 79–97)
Monocytes Absolute: 0.9 x10E3/uL (ref 0.1–0.9)
Monocytes: 15 %
Neutrophils Absolute: 4.4 x10E3/uL (ref 1.4–7.0)
Neutrophils: 71 %
Platelets: 237 x10E3/uL (ref 150–450)
RBC: 4.83 x10E6/uL (ref 4.14–5.80)
RDW: 11.8 % (ref 11.6–15.4)
WBC: 6.1 x10E3/uL (ref 3.4–10.8)

## 2024-04-09 LAB — LIPID PANEL
Chol/HDL Ratio: 1.8 ratio (ref 0.0–5.0)
Cholesterol, Total: 114 mg/dL (ref 100–199)
HDL: 65 mg/dL (ref >39)
LDL Chol Calc (NIH): 38 mg/dL (ref 0–99)
Triglycerides: 42 mg/dL (ref 0–149)
VLDL Cholesterol Cal: 11 mg/dL (ref 5–40)

## 2024-04-09 LAB — COMPREHENSIVE METABOLIC PANEL WITH GFR
ALT: 13 IU/L (ref 0–44)
AST: 16 IU/L (ref 0–40)
Albumin: 4.5 g/dL (ref 3.7–4.7)
Alkaline Phosphatase: 154 IU/L — ABNORMAL HIGH (ref 44–121)
BUN/Creatinine Ratio: 13 (ref 10–24)
BUN: 15 mg/dL (ref 8–27)
Bilirubin Total: 0.6 mg/dL (ref 0.0–1.2)
CO2: 21 mmol/L (ref 20–29)
Calcium: 9.1 mg/dL (ref 8.6–10.2)
Chloride: 90 mmol/L — ABNORMAL LOW (ref 96–106)
Creatinine, Ser: 1.2 mg/dL (ref 0.76–1.27)
Globulin, Total: 2.6 g/dL (ref 1.5–4.5)
Glucose: 221 mg/dL — ABNORMAL HIGH (ref 70–99)
Potassium: 4.8 mmol/L (ref 3.5–5.2)
Sodium: 128 mmol/L — ABNORMAL LOW (ref 134–144)
Total Protein: 7.1 g/dL (ref 6.0–8.5)
eGFR: 60 mL/min/1.73 (ref >59)

## 2024-04-09 LAB — B12 AND FOLATE PANEL
Folate: 12.5 ng/mL (ref >3.0)
Vitamin B-12: 1789 pg/mL — ABNORMAL HIGH (ref 232–1245)

## 2024-04-09 LAB — VITAMIN D 25 HYDROXY (VIT D DEFICIENCY, FRACTURES): Vit D, 25-Hydroxy: 26.9 ng/mL — ABNORMAL LOW (ref 30.0–100.0)

## 2024-04-11 ENCOUNTER — Ambulatory Visit: Admitting: Nurse Practitioner

## 2024-04-14 ENCOUNTER — Other Ambulatory Visit: Payer: Self-pay | Admitting: Nurse Practitioner

## 2024-04-14 DIAGNOSIS — J449 Chronic obstructive pulmonary disease, unspecified: Secondary | ICD-10-CM

## 2024-04-14 DIAGNOSIS — I10 Essential (primary) hypertension: Secondary | ICD-10-CM

## 2024-04-14 DIAGNOSIS — E1159 Type 2 diabetes mellitus with other circulatory complications: Secondary | ICD-10-CM

## 2024-04-19 ENCOUNTER — Other Ambulatory Visit: Payer: Self-pay | Admitting: Nurse Practitioner

## 2024-04-19 DIAGNOSIS — E1169 Type 2 diabetes mellitus with other specified complication: Secondary | ICD-10-CM

## 2024-04-19 DIAGNOSIS — I10 Essential (primary) hypertension: Secondary | ICD-10-CM

## 2024-04-25 ENCOUNTER — Telehealth: Payer: Self-pay

## 2024-04-25 NOTE — Telephone Encounter (Signed)
 Lvm for patient notifying him that patient assistance meds are here and ready for pick-up.

## 2024-04-28 ENCOUNTER — Encounter: Payer: Self-pay | Admitting: Nurse Practitioner

## 2024-04-28 ENCOUNTER — Ambulatory Visit: Admitting: Nurse Practitioner

## 2024-04-28 VITALS — BP 138/68 | HR 83 | Temp 98.3°F | Resp 16 | Ht 66.0 in | Wt 157.2 lb

## 2024-04-28 DIAGNOSIS — N1831 Chronic kidney disease, stage 3a: Secondary | ICD-10-CM | POA: Diagnosis not present

## 2024-04-28 DIAGNOSIS — E1169 Type 2 diabetes mellitus with other specified complication: Secondary | ICD-10-CM

## 2024-04-28 DIAGNOSIS — E1159 Type 2 diabetes mellitus with other circulatory complications: Secondary | ICD-10-CM | POA: Diagnosis not present

## 2024-04-28 DIAGNOSIS — Z794 Long term (current) use of insulin: Secondary | ICD-10-CM

## 2024-04-28 DIAGNOSIS — E785 Hyperlipidemia, unspecified: Secondary | ICD-10-CM

## 2024-04-28 DIAGNOSIS — I152 Hypertension secondary to endocrine disorders: Secondary | ICD-10-CM | POA: Diagnosis not present

## 2024-04-28 DIAGNOSIS — E1122 Type 2 diabetes mellitus with diabetic chronic kidney disease: Secondary | ICD-10-CM

## 2024-04-28 NOTE — Progress Notes (Signed)
 [facility] [address] [place], [place] [address]  Internal MEDICINE  Office Visit Note  Patient Name: Daniel Hodges  [id number]  [id number]  Date of Service: 04/28/2024  Chief Complaint  Patient presents with   Diabetes   Hypertension   Hyperlipidemia   Follow-up    HPI Daniel Hodges presents for a follow-up visit for diabetes, hypertension and high cholesterol.  Diabetes -- has been taking ozempic  as instructed. Patient was approved for PAP through [employer]  Approved PAP for ozempic  Hypertension -- controlled with current medications High cholesterol -- taking atorvastatin     Current Medication: Outpatient Encounter Medications as of 04/28/2024  Medication Sig   albuterol  (VENTOLIN  HFA) 108 (90 Base) MCG/ACT inhaler Inhale 2 puffs into the lungs every 6 (six) hours as needed for wheezing or shortness of breath.   amLODipine  (NORVASC ) 2.5 MG tablet TAKE 1 TABLET BY MOUTH DAILY   aspirin  81 MG tablet Take 81 mg by mouth daily.   atorvastatin  (LIPITOR) 20 MG tablet TAKE 1 TABLET BY MOUTH AT  BEDTIME   budeson-glycopyrrolate -formoterol  (BREZTRI  AEROSPHERE) 160-9-4.8 MCG/ACT AERO Inhale 2 puffs into the lungs 2 (two) times daily.   chlorpheniramine-HYDROcodone (TUSSIONEX) 10-8 MG/5ML Take 5 mLs by mouth every 12 (twelve) hours as needed for cough.   cholecalciferol  (VITAMIN D3) 25 MCG (1000 UT) tablet Take 1,000 Units by mouth daily.   cyanocobalamin  (VITAMIN B12) 1000 MCG tablet Take 1 tablet (1,000 mcg total) by mouth daily.   docusate sodium  (COLACE) 50 MG capsule Take 1 capsule (50 mg total) by mouth 2 (two) times daily.   feeding supplement, GLUCERNA SHAKE, (GLUCERNA SHAKE) LIQD One a day   ferrous sulfate  324 MG TBEC Take by mouth.   Fluticasone -Umeclidin-Vilant (TRELEGY ELLIPTA ) 100-62.5-25 MCG/ACT AEPB Inhale 1 puff into the lungs daily.   furosemide  (LASIX ) 20 MG tablet TAKE 1 TABLET BY MOUTH DAILY   gabapentin  (NEURONTIN ) 300 MG capsule TAKE 1  CAPSULE BY MOUTH DAILY  AND 1 CAPSULE BY MOUTH AT NIGHT   glipiZIDE  (GLUCOTROL  XL) 5 MG 24 hr tablet TAKE 1 TABLET BY MOUTH DAILY  WITH BREAKFAST (DISCONTINUE  MOUNJARO )   HUMALOG  KWIKPEN 100 UNIT/ML KwikPen INJECT INSULIN  INTO THE SKIN  WITH MEALS PER SLIDING SCALE  PROVIDED TO PATIENT. MAX DOSE  PER 24 HOURS: 42 UNITS   hydrocortisone  1 % ointment Apply small amount mix with lotion at night for leg rash   ipratropium-albuterol  (DUONEB) 0.5-2.5 (3) MG/3ML SOLN USE 3 ML VIA NEBULIZER EVERY 6 HOURS AS NEEDED   loratadine  (CLARITIN ) 10 MG tablet Take 1 tablet (10 mg total) by mouth daily.   losartan  (COZAAR ) 25 MG tablet Take 1 tablet (25 mg total) by mouth daily.   Magnesium  250 MG TABS Take 250 mg by mouth 2 (two) times daily.   montelukast  (SINGULAIR ) 10 MG tablet TAKE 1 TABLET BY MOUTH DAILY   mupirocin  ointment (BACTROBAN ) 2 % Apply 1 Application topically 2 (two) times daily. To superficial scratches on legs until resolved.   niacin  (NIASPAN ) 500 MG CR tablet niacin  ER 500 mg tablet,extended release 24 hr   Semaglutide , 1 MG/DOSE, 4 MG/3ML SOPN Inject 1 mg as directed once a week.   senna (SENOKOT) 8.6 MG tablet Take 1 tablet (8.6 mg total) by mouth daily.   simethicone  (MYLICON) 80 MG chewable tablet Chew 2 tablets (160 mg total) by mouth 2 (two) times daily.   Tiotropium Bromide-Olodaterol (STIOLTO RESPIMAT ) 2.5-2.5 MCG/ACT AERS Inhale 1 Inhalation into the lungs daily.   TOUJEO   SOLOSTAR 300 UNIT/ML Solostar Pen INJECT SUBCUTANEOUSLY 18 UNITS  AT BEDTIME   [DISCONTINUED] Continuous Glucose Sensor (FREESTYLE LIBRE 2 SENSOR) MISC CHANGE EVERY 14 DAYS   No facility-administered encounter medications on file as of 04/28/2024.    Surgical History: Past Surgical History:  Procedure Laterality Date   APPENDECTOMY     COLONOSCOPY WITH PROPOFOL  N/A 06/11/2015   Procedure: COLONOSCOPY WITH PROPOFOL ;  Surgeon: Lamar ONEIDA Holmes, MD;  Location: [facility] ENDOSCOPY;  Service: Endoscopy;  Laterality: N/A;    CORONARY ARTERY BYPASS GRAFT     HERNIA REPAIR     TEE WITHOUT CARDIOVERSION     TRACHEOSTOMY     VASCULAR SURGERY      Medical History: Past Medical History:  Diagnosis Date   Anginal pain    Asthma    Coronary artery disease    Diabetes mellitus without complication (HCC)    Hyperlipidemia    Hypertension    Sleep apnea     Family History: Family History  Problem Relation Age of Onset   Cancer Sister    Diabetes Daughter    Diabetes Son     Social History   Socioeconomic History   Marital status: Married    Spouse name: Not on file   Number of children: Not on file   Years of education: Not on file   Highest education level: Not on file  Occupational History   Not on file  Tobacco Use   Smoking status: Never   Smokeless tobacco: Never  Vaping Use   Vaping status: Never Used  Substance and Sexual Activity   Alcohol use: No   Drug use: No   Sexual activity: Not on file  Other Topics Concern   Not on file  Social History Narrative   Not on file   Social Drivers of Health   Financial Resource Strain: Not on file  Food Insecurity: No Food Insecurity (10/31/2022)   Hunger Vital Sign    Worried About Running Out of Food in the Last Year: Never true    Ran Out of Food in the Last Year: Never true  Transportation Needs: No Transportation Needs (10/31/2022)   PRAPARE - Administrator, Civil Service (Medical): No    Lack of Transportation (Non-Medical): No  Physical Activity: Not on file  Stress: Not on file  Social Connections: Not on file  Intimate Partner Violence: Not At Risk (10/31/2022)   Humiliation, Afraid, Rape, and Kick questionnaire    Fear of Current or Ex-Partner: No    Emotionally Abused: No    Physically Abused: No    Sexually Abused: No      Review of Systems  Constitutional:  Negative for activity change, appetite change, chills, fatigue, fever and unexpected weight change.  HENT: Negative.  Negative for congestion, ear  pain, rhinorrhea, sore throat and trouble swallowing.   Eyes: Negative.   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 Negative for chest tightness, shortness of breath and wheezing.   Cardiovascular: Negative.  Negative for chest pain and palpitations.  Gastrointestinal: Negative.  Negative for abdominal pain, blood in stool, constipation, diarrhea, nausea and vomiting.  Endocrine: Negative.   Genitourinary: Negative.  Negative for difficulty urinating, dysuria, frequency, hematuria and urgency.  Musculoskeletal: Negative.  Negative for arthralgias, back pain, joint swelling, myalgias and neck pain.  Skin: Negative.  Negative for rash and wound.  Allergic/Immunologic: Negative.  Negative for immunocompromised state.  Neurological: Negative.  Negative for dizziness, seizures, numbness  and headaches.  Hematological: Negative.   Psychiatric/Behavioral: Negative.  Negative for behavioral problems, self-injury and suicidal ideas. The patient is not nervous/anxious.     Vital Signs: BP 138/68   Pulse 83   Temp 98.3 F (36.8 C)   Resp 16   Ht 5' 6 (1.676 m)   Wt 157 lb 3.2 oz (71.3 kg)   SpO2 95%   BMI 25.37 kg/m    Physical Exam Vitals reviewed.  Constitutional:      Appearance: Normal appearance.  HENT:     Head: Normocephalic and atraumatic.  Eyes:     Pupils: Pupils are equal, round, and reactive to light.  Cardiovascular:     Rate and Rhythm: Normal rate and regular rhythm.     Heart sounds: Normal heart sounds. No murmur heard. Pulmonary:     Effort: Pulmonary effort is normal. No respiratory distress.     Breath sounds: Normal breath sounds. No wheezing.  Neurological:     Mental Status: He is alert and oriented to person, place, and time.  Psychiatric:        Mood and Affect: Mood normal.        Behavior: Behavior normal.        Assessment/Plan: 1. Type 2 diabetes mellitus with stage 3a chronic kidney disease, with long-term current use  of insulin  (HCC) (Primary) Urine sent for microalbumin/creatinine ratio. Continue ozempic  as prescribed. - Urine Microalbumin w/creat. ratio  2. Hypertension associated with type 2 diabetes mellitus (HCC) Stable, continue amlodipine , losartan  and furosemide  as prescribed.   3. Hyperlipidemia associated with type 2 diabetes mellitus (HCC) Continue atorvastatin  as prescribed.   General Counseling: Kahlel verbalizes understanding of the findings of todays visit and agrees with plan of treatment. I have discussed any further diagnostic evaluation that may be needed or ordered today. We also reviewed his medications today. he has been encouraged to call the office with any questions or concerns that should arise related to todays visit.    Orders Placed This Encounter  Procedures   Urine Microalbumin w/creat. ratio    No orders of the defined types were placed in this encounter.   Return in about 2 months (around 07/12/2024) for F/U, Recheck A1C, Mozel Burdett PCP.   Total time spent:30 Minutes Time spent includes review of chart, medications, test results, and follow up plan with the patient.   [place] Controlled Substance Database was reviewed by me.  This patient was seen by Mardy Maxin, FNP-C in collaboration with Dr. Sigrid Bathe as a part of collaborative care agreement.   Ferol Laiche R. Maxin, MSN, FNP-C Internal medicine

## 2024-04-29 LAB — MICROALBUMIN / CREATININE URINE RATIO
Creatinine, Urine: 14.6 mg/dL
Microalb/Creat Ratio: <21 mg/g{creat} (ref 0–29)
Microalbumin, Urine: <3 ug/mL

## 2024-05-07 ENCOUNTER — Other Ambulatory Visit: Payer: Self-pay | Admitting: Nurse Practitioner

## 2024-05-07 DIAGNOSIS — E1122 Type 2 diabetes mellitus with diabetic chronic kidney disease: Secondary | ICD-10-CM

## 2024-05-09 ENCOUNTER — Other Ambulatory Visit: Payer: Self-pay

## 2024-05-09 DIAGNOSIS — E1122 Type 2 diabetes mellitus with diabetic chronic kidney disease: Secondary | ICD-10-CM

## 2024-05-09 MED ORDER — FREESTYLE LIBRE 2 SENSOR MISC: 3 refills | Status: DC

## 2024-06-13 ENCOUNTER — Encounter: Payer: Self-pay | Admitting: Nurse Practitioner

## 2024-06-27 ENCOUNTER — Other Ambulatory Visit: Payer: Self-pay | Admitting: Nurse Practitioner

## 2024-06-27 DIAGNOSIS — I10 Essential (primary) hypertension: Secondary | ICD-10-CM

## 2024-06-27 DIAGNOSIS — E1169 Type 2 diabetes mellitus with other specified complication: Secondary | ICD-10-CM

## 2024-07-05 NOTE — Progress Notes (Signed)
 Daniel Hodges                                          MRN: [id number]   07/05/2024   The [facility] Quality Team Specialist reviewed this patient medical record for the purposes of chart review for care gap closure. The following were reviewed: chart review for care gap closure-kidney health evaluation for diabetes:eGFR  and uACR.    [facility]BCI Quality Team

## 2024-07-11 ENCOUNTER — Telehealth: Payer: Self-pay | Admitting: Nurse Practitioner

## 2024-07-11 NOTE — Telephone Encounter (Signed)
 Received medical clearance from [facility]. Gave to Alyssa for signature-Toni

## 2024-07-13 ENCOUNTER — Ambulatory Visit: Admitting: Nurse Practitioner

## 2024-07-13 ENCOUNTER — Encounter: Payer: Self-pay | Admitting: Nurse Practitioner

## 2024-07-13 VITALS — BP 130/72 | HR 95 | Temp 96.9°F | Resp 16 | Ht 66.0 in | Wt 148.0 lb

## 2024-07-13 DIAGNOSIS — E1159 Type 2 diabetes mellitus with other circulatory complications: Secondary | ICD-10-CM | POA: Diagnosis not present

## 2024-07-13 DIAGNOSIS — E1169 Type 2 diabetes mellitus with other specified complication: Secondary | ICD-10-CM

## 2024-07-13 DIAGNOSIS — Z794 Long term (current) use of insulin: Secondary | ICD-10-CM

## 2024-07-13 DIAGNOSIS — I152 Hypertension secondary to endocrine disorders: Secondary | ICD-10-CM

## 2024-07-13 DIAGNOSIS — E1122 Type 2 diabetes mellitus with diabetic chronic kidney disease: Secondary | ICD-10-CM

## 2024-07-13 DIAGNOSIS — J449 Chronic obstructive pulmonary disease, unspecified: Secondary | ICD-10-CM

## 2024-07-13 DIAGNOSIS — E785 Hyperlipidemia, unspecified: Secondary | ICD-10-CM

## 2024-07-13 DIAGNOSIS — Z23 Encounter for immunization: Secondary | ICD-10-CM | POA: Diagnosis not present

## 2024-07-13 DIAGNOSIS — N1831 Chronic kidney disease, stage 3a: Secondary | ICD-10-CM | POA: Diagnosis not present

## 2024-07-13 LAB — POCT GLYCOSYLATED HEMOGLOBIN (HGB A1C): Hemoglobin A1C: 8.6 % — AB (ref 4.0–5.6)

## 2024-07-13 MED ORDER — HUMALOG KWIKPEN 100 UNIT/ML ~~LOC~~ SOPN: PEN_INJECTOR | SUBCUTANEOUS | 2 refills | Status: DC

## 2024-07-13 MED ORDER — PNEUMOCOCCAL 20-VAL CONJ VACC 0.5 ML IM SUSY: 0.5000 mL | PREFILLED_SYRINGE | Freq: Once | INTRAMUSCULAR | 0 refills | Status: AC | PRN

## 2024-07-13 MED ORDER — ZOSTER VAC RECOMB ADJUVANTED 50 MCG/0.5ML IM SUSR: 0.5000 mL | Freq: Once | INTRAMUSCULAR | 1 refills | Status: AC | PRN

## 2024-07-13 NOTE — Progress Notes (Unsigned)
 [facility] [address] [place], [place] [address]  Internal MEDICINE  Office Visit Note  Patient Name: Daniel Hodges  [id number]  [id number]  Date of Service: 07/13/2024  Chief Complaint  Patient presents with   Diabetes   Hypertension   Hyperlipidemia   Follow-up    HPI Daniel Hodges presents for a follow-up visit for diabetes, hypertension COPD, medications and diabetes management.  Diabetes -- A1c has improve to 8.6 today from 9.3. 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 He is a nonsmoker. He has had a chronic cough. He is currently on trelegy ellipta  inhaler once daily. He had his last PFT in early 2024. He also had a CT chest last year. He has previously seen Dr. Elfreda Bathe for COPD but it has been a while since he has seen him.  Unable to review medications with patient. He did not bring them with him or a current list and does not remember all of their names.     Current Medication: Outpatient Encounter Medications as of 07/13/2024  Medication Sig   pneumococcal 20-valent conjugate vaccine (PREVNAR 20) 0.5 ML injection Inject 0.5 mLs into the muscle once as needed for up to 1 dose for immunization.   Zoster Vaccine Adjuvanted [facility]) injection Inject 0.5 mLs into the muscle once as needed for up to 1 dose (shingles vaccination).   albuterol  (VENTOLIN  HFA) 108 (90 Base) MCG/ACT inhaler Inhale 2 puffs into the lungs every 6 (six) hours as needed for wheezing or shortness of breath.   amLODipine  (NORVASC ) 2.5 MG tablet TAKE 1 TABLET BY MOUTH DAILY   aspirin  81 MG tablet Take 81 mg by mouth daily.   atorvastatin  (LIPITOR) 20 MG tablet TAKE 1 TABLET BY MOUTH AT  BEDTIME   chlorpheniramine-HYDROcodone (TUSSIONEX) 10-8 MG/5ML Take 5 mLs by mouth every 12 (twelve) hours as needed for cough.   cholecalciferol  (VITAMIN D3) 25 MCG (1000 UT) tablet Take 1,000 Units by  mouth daily.   Continuous Glucose Sensor (FREESTYLE LIBRE 2 SENSOR) MISC USE EVERY 14 DAYS AS DIRECTED   cyanocobalamin  (VITAMIN B12) 1000 MCG tablet Take 1 tablet (1,000 mcg total) by mouth daily.   docusate sodium  (COLACE) 50 MG capsule Take 1 capsule (50 mg total) by mouth 2 (two) times daily.   feeding supplement, GLUCERNA SHAKE, (GLUCERNA SHAKE) LIQD One a day   ferrous sulfate  324 MG TBEC Take by mouth.   Fluticasone -Umeclidin-Vilant (TRELEGY ELLIPTA ) 100-62.5-25 MCG/ACT AEPB Inhale 1 puff into the lungs daily.   furosemide  (LASIX ) 20 MG tablet TAKE 1 TABLET BY MOUTH DAILY   gabapentin  (NEURONTIN ) 300 MG capsule TAKE 1 CAPSULE BY MOUTH DAILY  AND 1 CAPSULE BY MOUTH AT NIGHT   glipiZIDE  (GLUCOTROL  XL) 5 MG 24 hr tablet TAKE 1 TABLET BY MOUTH DAILY  WITH BREAKFAST (DISCONTINUE  MOUNJARO )   HUMALOG  KWIKPEN 100 UNIT/ML KwikPen Inject insulin  into the skin with meals per sliding scale provided to patient. Max dose per 24 hours: 42 units.   hydrocortisone  1 % ointment Apply small amount mix with lotion at night for leg rash   ipratropium-albuterol  (DUONEB) 0.5-2.5 (3) MG/3ML SOLN USE 3 ML VIA NEBULIZER EVERY 6 HOURS AS NEEDED   loratadine  (CLARITIN ) 10 MG tablet Take 1 tablet (10 mg total) by mouth daily.   losartan  (COZAAR ) 25 MG tablet TAKE 1 TABLET BY MOUTH DAILY   Magnesium  250 MG TABS Take 250 mg by  mouth 2 (two) times daily.   montelukast  (SINGULAIR ) 10 MG tablet TAKE 1 TABLET BY MOUTH DAILY   mupirocin  ointment (BACTROBAN ) 2 % Apply 1 Application topically 2 (two) times daily. To superficial scratches on legs until resolved.   niacin  (NIASPAN ) 500 MG CR tablet niacin  ER 500 mg tablet,extended release 24 hr   Semaglutide , 1 MG/DOSE, 4 MG/3ML SOPN Inject 1 mg as directed once a week.   senna (SENOKOT) 8.6 MG tablet Take 1 tablet (8.6 mg total) by mouth daily.   simethicone  (MYLICON) 80 MG chewable tablet Chew 2 tablets (160 mg total) by mouth 2 (two) times daily.   Tiotropium  Bromide-Olodaterol (STIOLTO RESPIMAT ) 2.5-2.5 MCG/ACT AERS Inhale 1 Inhalation into the lungs daily.   TOUJEO  SOLOSTAR 300 UNIT/ML Solostar Pen INJECT SUBCUTANEOUSLY 18 UNITS  AT BEDTIME   [DISCONTINUED] budeson-glycopyrrolate -formoterol  (BREZTRI  AEROSPHERE) 160-9-4.8 MCG/ACT AERO Inhale 2 puffs into the lungs 2 (two) times daily.   [DISCONTINUED] HUMALOG  KWIKPEN 100 UNIT/ML KwikPen INJECT INSULIN  INTO THE SKIN  WITH MEALS PER SLIDING SCALE  PROVIDED TO PATIENT. MAX DOSE  PER 24 HOURS: 42 UNITS   No facility-administered encounter medications on file as of 07/13/2024.    Surgical History: Past Surgical History:  Procedure Laterality Date   APPENDECTOMY     COLONOSCOPY WITH PROPOFOL  N/A 06/11/2015   Procedure: COLONOSCOPY WITH PROPOFOL ;  Surgeon: Lamar ONEIDA Holmes, MD;  Location: [facility] ENDOSCOPY;  Service: Endoscopy;  Laterality: N/A;   CORONARY ARTERY BYPASS GRAFT     HERNIA REPAIR     TEE WITHOUT CARDIOVERSION     TRACHEOSTOMY     VASCULAR SURGERY      Medical History: Past Medical History:  Diagnosis Date   Acute metabolic encephalopathy 10/31/2022   Anginal pain    Asthma    Coronary artery disease    Diabetes mellitus without complication (HCC)    Hyperlipidemia    Hypertension    Hypoglycemia 11/02/2022   Ileus (HCC) 09/20/2013   Pleural effusion 08/06/2018   Postoperative anemia due to acute blood loss 09/06/2013   S/P appendectomy 09/01/2013   Severe sepsis (HCC) 10/31/2022   Sleep apnea    SOB (shortness of breath) 01/22/2018    Family History: Family History  Problem Relation Age of Onset   Cancer Sister    Diabetes Daughter    Diabetes Son     Social History   Socioeconomic History   Marital status: Married    Spouse name: Not on file   Number of children: Not on file   Years of education: Not on file   Highest education level: Not on file  Occupational History   Not on file  Tobacco Use   Smoking status: Never   Smokeless tobacco: Never  Vaping  Use   Vaping status: Never Used  Substance and Sexual Activity   Alcohol use: No   Drug use: No   Sexual activity: Not on file  Other Topics Concern   Not on file  Social History Narrative   Not on file   Social Drivers of Health   Financial Resource Strain: Not on file  Food Insecurity: No Food Insecurity (10/31/2022)   Hunger Vital Sign    Worried About Running Out of Food in the Last Year: Never true    Ran Out of Food in the Last Year: Never true  Transportation Needs: No Transportation Needs (10/31/2022)   PRAPARE - Administrator, Civil Service (Medical): No    Lack of Transportation (Non-Medical):  No  Physical Activity: Not on file  Stress: Not on file  Social Connections: Not on file  Intimate Partner Violence: Not At Risk (10/31/2022)   Humiliation, Afraid, Rape, and Kick questionnaire    Fear of Current or Ex-Partner: No    Emotionally Abused: No    Physically Abused: No    Sexually Abused: No      Review of Systems  Constitutional:  Negative for activity change, appetite change, chills, fatigue, fever and unexpected weight change.  HENT: Negative.  Negative for congestion, ear pain, rhinorrhea, sore throat and trouble swallowing.   Eyes: Negative.   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 Negative for chest tightness, shortness of breath and wheezing.   Cardiovascular: Negative.  Negative for chest pain and palpitations.  Gastrointestinal: Negative.  Negative for abdominal pain, blood in stool, constipation, diarrhea, nausea and vomiting.  Endocrine: Negative.   Genitourinary: Negative.  Negative for difficulty urinating, dysuria, frequency, hematuria and urgency.  Musculoskeletal: Negative.  Negative for arthralgias, back pain, joint swelling, myalgias and neck pain.  Skin: Negative.  Negative for rash and wound.  Allergic/Immunologic: Negative.  Negative for immunocompromised state.  Neurological: Negative.  Negative for  dizziness, seizures, numbness and headaches.  Hematological: Negative.   Psychiatric/Behavioral: Negative.  Negative for behavioral problems, self-injury and suicidal ideas. The patient is not nervous/anxious.     Vital Signs: BP 130/72   Pulse 95   Temp (!) 96.9 F (36.1 C)   Resp 16   Ht 5' 6 (1.676 m)   Wt 148 lb (67.1 kg)   SpO2 96%   BMI 23.89 kg/m    Physical Exam Vitals reviewed.  Constitutional:      Appearance: Normal appearance.  HENT:     Head: Normocephalic and atraumatic.  Eyes:     Pupils: Pupils are equal, round, and reactive to light.  Cardiovascular:     Rate and Rhythm: Normal rate and regular rhythm.     Heart sounds: Normal heart sounds. No murmur heard. Pulmonary:     Effort: Pulmonary effort is normal. No respiratory distress.     Breath sounds: Normal breath sounds. No wheezing.  Neurological:     Mental Status: He is alert and oriented to person, place, and time.  Psychiatric:        Mood and Affect: Mood normal.        Behavior: Behavior normal.        Assessment/Plan: 1. COPD mixed type (HCC) (Primary) PFT ordered and will follow up with Dr. Elfreda Bathe after for results.  - Pulmonary function test; Future  2. Type 2 diabetes mellitus with stage 3a chronic kidney disease, with long-term current use of insulin  (HCC) Continue ozempic , farxiga  and insulin  as prescribed. Follow up with Dr. Sigrid Bathe in a couple of weeks and bring all medications to the visit to be reviewed.  - POCT glycosylated hemoglobin (Hb A1C) - HUMALOG  KWIKPEN 100 UNIT/ML KwikPen; Inject insulin  into the skin with meals per sliding scale provided to patient. Max dose per 24 hours: 42 units.  Dispense: 45 mL; Refill: 2  3. Hypertension associated with type 2 diabetes mellitus (HCC) Stable, continue losartan , amlodipine  and furosemide  as prescribed   4. Hyperlipidemia associated with type 2 diabetes mellitus (HCC) Continue atorvastatin  as prescribed.   5. Need for  vaccination Flu vaccine administered today in office. Orders for shingrix and prevnar 20 sent to the pharmacy.  - Influenza, MDCK, trivalent, PF(Flucelvax egg-free) - pneumococcal 20-valent  conjugate vaccine (PREVNAR 20) 0.5 ML injection; Inject 0.5 mLs into the muscle once as needed for up to 1 dose for immunization.  Dispense: 0.5 mL; Refill: 0 - Zoster Vaccine Adjuvanted [facility]) injection; Inject 0.5 mLs into the muscle once as needed for up to 1 dose (shingles vaccination).  Dispense: 0.5 mL; Refill: 1   General Counseling: Saw verbalizes understanding of the findings of todays visit and agrees with plan of treatment. I have discussed any further diagnostic evaluation that may be needed or ordered today. We also reviewed his medications today. he has been encouraged to call the office with any questions or concerns that should arise related to todays visit.    Orders Placed This Encounter  Procedures   Influenza, MDCK, trivalent, PF(Flucelvax egg-free)   POCT glycosylated hemoglobin (Hb A1C)   Pulmonary function test    Meds ordered this encounter  Medications   pneumococcal 20-valent conjugate vaccine (PREVNAR 20) 0.5 ML injection    Sig: Inject 0.5 mLs into the muscle once as needed for up to 1 dose for immunization.    Dispense:  0.5 mL    Refill:  0    Due for prevnar 20   Zoster Vaccine Adjuvanted [facility]) injection    Sig: Inject 0.5 mLs into the muscle once as needed for up to 1 dose (shingles vaccination).    Dispense:  0.5 mL    Refill:  1    Due for 2 dose shingles vaccine   HUMALOG  KWIKPEN 100 UNIT/ML KwikPen    Sig: Inject insulin  into the skin with meals per sliding scale provided to patient. Max dose per 24 hours: 42 units.    Dispense:  45 mL    Refill:  2    Please send a replace/new response with 100-Day Supply if appropriate to maximize member benefit. Requesting 1 year supply.    Return for f/u with [facility] on 07/26/24 for diabetes management, need  PFT and f/u with [facility] also.   Total time spent:30 Minutes Time spent includes review of chart, medications, test results, and follow up plan with the patient.   [place] Controlled Substance Database was reviewed by me.  This patient was seen by Mardy Maxin, FNP-C in collaboration with Dr. Sigrid Bathe as a part of collaborative care agreement.   Onesimo Lingard R. Maxin, MSN, FNP-C Internal medicine

## 2024-07-14 ENCOUNTER — Encounter: Payer: Self-pay | Admitting: Nurse Practitioner

## 2024-07-26 ENCOUNTER — Encounter: Payer: Self-pay | Admitting: Internal Medicine

## 2024-07-26 ENCOUNTER — Ambulatory Visit: Admitting: Internal Medicine

## 2024-07-26 ENCOUNTER — Telehealth: Payer: Self-pay

## 2024-07-26 ENCOUNTER — Other Ambulatory Visit: Payer: Self-pay

## 2024-07-26 VITALS — BP 135/70 | HR 89 | Temp 98.0°F | Resp 16 | Ht 66.0 in | Wt 148.0 lb

## 2024-07-26 DIAGNOSIS — I700 Atherosclerosis of aorta: Secondary | ICD-10-CM

## 2024-07-26 DIAGNOSIS — E1169 Type 2 diabetes mellitus with other specified complication: Secondary | ICD-10-CM

## 2024-07-26 DIAGNOSIS — E1142 Type 2 diabetes mellitus with diabetic polyneuropathy: Secondary | ICD-10-CM

## 2024-07-26 DIAGNOSIS — Z794 Long term (current) use of insulin: Secondary | ICD-10-CM | POA: Diagnosis not present

## 2024-07-26 DIAGNOSIS — E785 Hyperlipidemia, unspecified: Secondary | ICD-10-CM

## 2024-07-26 MED ORDER — EMPAGLIFLOZIN 25 MG PO TABS: 25.0000 mg | ORAL_TABLET | Freq: Every day | ORAL | 3 refills | Status: AC

## 2024-07-26 MED ORDER — TIRZEPATIDE 5 MG/0.5ML ~~LOC~~ SOAJ: 5.0000 mg | SUBCUTANEOUS | 3 refills | Status: DC

## 2024-07-26 MED ORDER — EMPAGLIFLOZIN 25 MG PO TABS: 25.0000 mg | ORAL_TABLET | Freq: Every day | ORAL | 3 refills | Status: DC

## 2024-07-26 NOTE — Addendum Note (Signed)
 Addended by: FERNAND SIGRID HERO on: 07/26/2024 03:51 PM   Modules accepted: Orders

## 2024-07-26 NOTE — Telephone Encounter (Signed)
 Pt wife advised that we are discontinue Mounjaro  and not to pickup medication we will change

## 2024-07-26 NOTE — Progress Notes (Addendum)
 [facility] [address] [place], [place] [address]  Internal MEDICINE  Office Visit Note  Patient Name: Daniel Hodges  [id number]  [id number]  Date of Service: 07/26/2024  Chief Complaint  Patient presents with   Follow-up    Diabetes management   Medication Management    Requesting Mounjaro  instead Semaglutide    Quality Metric Gaps    Eye Exam    HPI Pt is seen for follow up and management of diabetes  He is having fluctuations ( high to low) blood glucose. 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   amLODipine  (NORVASC ) 2.5 MG tablet TAKE 1 TABLET BY MOUTH DAILY   aspirin  81 MG tablet Take 81 mg by mouth daily.   atorvastatin  (LIPITOR) 20 MG tablet TAKE 1 TABLET BY MOUTH AT  BEDTIME   chlorpheniramine-HYDROcodone (TUSSIONEX) 10-8 MG/5ML Take 5 mLs by mouth every 12 (twelve) hours as needed for cough.   cholecalciferol  (VITAMIN D3) 25 MCG (1000 UT) tablet Take 1,000 Units by mouth daily.   Continuous Glucose Sensor (FREESTYLE LIBRE 2 SENSOR) MISC USE EVERY 14 DAYS AS DIRECTED   cyanocobalamin  (VITAMIN B12) 1000 MCG tablet Take 1 tablet (1,000 mcg total) by mouth daily.   docusate sodium  (COLACE) 50 MG capsule Take 1 capsule (50 mg total) by mouth 2 (two) times daily.   feeding supplement, GLUCERNA SHAKE, (GLUCERNA SHAKE) LIQD One a day   ferrous sulfate  324 MG TBEC Take by mouth.   Fluticasone -Umeclidin-Vilant (TRELEGY ELLIPTA ) 100-62.5-25 MCG/ACT AEPB Inhale 1 puff into the lungs daily.   furosemide  (LASIX ) 20 MG tablet TAKE 1 TABLET BY MOUTH DAILY   gabapentin  (NEURONTIN ) 300 MG capsule TAKE 1  CAPSULE BY MOUTH DAILY  AND 1 CAPSULE BY MOUTH AT NIGHT   glipiZIDE  (GLUCOTROL  XL) 5 MG 24 hr tablet TAKE 1 TABLET BY MOUTH DAILY  WITH BREAKFAST (DISCONTINUE  MOUNJARO )   HUMALOG  KWIKPEN 100 UNIT/ML KwikPen Inject insulin  into the skin with meals per sliding scale provided to patient. Max dose per 24 hours: 42 units.   hydrocortisone  1 % ointment Apply small amount mix with lotion at night for leg rash   ipratropium-albuterol  (DUONEB) 0.5-2.5 (3) MG/3ML SOLN USE 3 ML VIA NEBULIZER EVERY 6 HOURS AS NEEDED   loratadine  (CLARITIN ) 10 MG tablet Take 1 tablet (10 mg total) by mouth daily.   losartan  (COZAAR ) 25 MG tablet TAKE 1 TABLET BY MOUTH DAILY   Magnesium  250 MG TABS Take 250 mg by mouth 2 (two) times daily.   montelukast  (SINGULAIR ) 10 MG tablet TAKE 1 TABLET BY MOUTH DAILY   mupirocin  ointment (BACTROBAN ) 2 % Apply 1 Application topically 2 (two) times daily. To superficial scratches on legs until resolved.   niacin  (NIASPAN ) 500 MG CR tablet niacin  ER 500 mg tablet,extended release 24 hr   pneumococcal 20-valent conjugate vaccine (PREVNAR 20) 0.5 ML injection Inject 0.5 mLs into the muscle once as needed for up to 1 dose for immunization.   senna (SENOKOT) 8.6 MG tablet Take 1 tablet (8.6 mg total) by mouth daily.   simethicone  (MYLICON) 80 MG chewable tablet Chew 2 tablets (160 mg total)  by mouth 2 (two) times daily.   Tiotropium Bromide-Olodaterol (STIOLTO RESPIMAT ) 2.5-2.5 MCG/ACT AERS Inhale 1 Inhalation into the lungs daily.   TOUJEO  SOLOSTAR 300 UNIT/ML Solostar Pen INJECT SUBCUTANEOUSLY 18 UNITS  AT BEDTIME   Zoster Vaccine Adjuvanted [facility]) injection Inject 0.5 mLs into the muscle once as needed for up to 1 dose (shingles vaccination).   [DISCONTINUED] empagliflozin (JARDIANCE) 25 MG TABS tablet Take 1 tablet (25 mg total) by mouth daily.   [DISCONTINUED] Semaglutide , 1 MG/DOSE, 4 MG/3ML SOPN Inject 1 mg as directed once a week.   [DISCONTINUED] tirzepatide  (MOUNJARO ) 5 MG/0.5ML  Pen Inject 5 mg into the skin once a week.   empagliflozin (JARDIANCE) 25 MG TABS tablet Take 1 tablet (25 mg total) by mouth daily.   No facility-administered encounter medications on file as of 07/26/2024.    Surgical History: Past Surgical History:  Procedure Laterality Date   APPENDECTOMY     COLONOSCOPY WITH PROPOFOL  N/A 06/11/2015   Procedure: COLONOSCOPY WITH PROPOFOL ;  Surgeon: Lamar ONEIDA Holmes, MD;  Location: [facility] ENDOSCOPY;  Service: Endoscopy;  Laterality: N/A;   CORONARY ARTERY BYPASS GRAFT     HERNIA REPAIR     TEE WITHOUT CARDIOVERSION     TRACHEOSTOMY     VASCULAR SURGERY      Medical History: Past Medical History:  Diagnosis Date   Acute metabolic encephalopathy 10/31/2022   Anginal pain    Asthma    Coronary artery disease    Diabetes mellitus without complication (HCC)    Hyperlipidemia    Hypertension    Hypoglycemia 11/02/2022   Ileus (HCC) 09/20/2013   Pleural effusion 08/06/2018   Postoperative anemia due to acute blood loss 09/06/2013   S/P appendectomy 09/01/2013   Severe sepsis (HCC) 10/31/2022   Sleep apnea    SOB (shortness of breath) 01/22/2018    Family History: Family History  Problem Relation Age of Onset   Cancer Sister    Diabetes Daughter    Diabetes Son     Social History   Socioeconomic History   Marital status: Married    Spouse name: Not on file   Number of children: Not on file   Years of education: Not on file   Highest education level: Not on file  Occupational History   Not on file  Tobacco Use   Smoking status: Never   Smokeless tobacco: Never  Vaping Use   Vaping status: Never Used  Substance and Sexual Activity   Alcohol use: No   Drug use: No   Sexual activity: Not on file  Other Topics Concern   Not on file  Social History Narrative   Not on file   Social Drivers of Health   Financial Resource Strain: Not on file  Food Insecurity: No Food Insecurity (10/31/2022)   Hunger Vital Sign    Worried  About Running Out of Food in the Last Year: Never true    Ran Out of Food in the Last Year: Never true  Transportation Needs: No Transportation Needs (10/31/2022)   PRAPARE - Administrator, Civil Service (Medical): No    Lack of Transportation (Non-Medical): No  Physical Activity: Not on file  Stress: Not on file  Social Connections: Not on file  Intimate Partner Violence: Not At Risk (10/31/2022)   Humiliation, Afraid, Rape, and Kick questionnaire    Fear of Current or Ex-Partner: No    Emotionally Abused: No    Physically Abused: No    Sexually Abused:  No      Review of Systems  Constitutional:  Negative for fatigue and fever.  HENT:  Negative for congestion, mouth sores and postnasal drip.   Respiratory:  Negative for cough.   Cardiovascular:  Negative for chest pain.  Genitourinary:  Negative for flank pain.  Psychiatric/Behavioral: Negative.      Vital Signs: BP 135/70   Pulse 89   Temp 98 F (36.7 C)   Resp 16   Ht 5' 6 (1.676 m)   Wt 148 lb (67.1 kg)   SpO2 98%   BMI 23.89 kg/m    Physical Exam Constitutional:      Appearance: Normal appearance.  HENT:     Head: Normocephalic and atraumatic.     Nose: Nose normal.     Mouth/Throat:     Mouth: Mucous membranes are moist.     Pharynx: No posterior oropharyngeal erythema.  Eyes:     Extraocular Movements: Extraocular movements intact.     Pupils: Pupils are equal, round, and reactive to light.  Cardiovascular:     Pulses: Normal pulses.     Heart sounds: Normal heart sounds.  Pulmonary:     Effort: Pulmonary effort is normal.     Breath sounds: Normal breath sounds.  Neurological:     General: No focal deficit present.     Mental Status: He is alert.  Psychiatric:        Mood and Affect: Mood normal.        Behavior: Behavior normal.        Assessment/Plan: 1. Type 2 diabetes mellitus with other specified complication, with long-term current use of insulin  (HCC) (Primary) Will  need to make few changes, DC humalog  and Glipizide  due to risk of hypoglycemia. Start Jardiance 25 mg po every day    2. Hyperlipidemia associated with type 2 diabetes mellitus (HCC) ContinueLipitor   3. Diabetic polyneuropathy associated with type 2 diabetes mellitus (HCC) Continue gabapentin     4. Aortic atherosclerosis Continue Lipitor   General Counseling: Cyan verbalizes understanding of the findings of todays visit and agrees with plan of treatment. I have discussed any further diagnostic evaluation that may be needed or ordered today. We also reviewed his medications today. he has been encouraged to call the office with any questions or concerns that should arise related to todays visit.    No orders of the defined types were placed in this encounter.   Meds ordered this encounter  Medications   DISCONTD: tirzepatide  (MOUNJARO ) 5 MG/0.5ML Pen    Sig: Inject 5 mg into the skin once a week.    Dispense:  6 mL    Refill:  3   DISCONTD: empagliflozin (JARDIANCE) 25 MG TABS tablet    Sig: Take 1 tablet (25 mg total) by mouth daily.    Dispense:  90 tablet    Refill:  3   empagliflozin (JARDIANCE) 25 MG TABS tablet    Sig: Take 1 tablet (25 mg total) by mouth daily.    Dispense:  90 tablet    Refill:  3    Total time spent:45 Minutes Time spent includes review of chart, medications, test results, and follow up plan with the patient.   [place] Controlled Substance Database was reviewed by me.   Dr Blanch Stang M Shann Lewellyn Internal medicine

## 2024-07-27 ENCOUNTER — Ambulatory Visit: Admitting: Internal Medicine

## 2024-07-27 ENCOUNTER — Other Ambulatory Visit: Payer: Self-pay

## 2024-07-27 DIAGNOSIS — J449 Chronic obstructive pulmonary disease, unspecified: Secondary | ICD-10-CM | POA: Diagnosis not present

## 2024-07-27 MED ORDER — EMPAGLIFLOZIN 25 MG PO TABS: 25.0000 mg | ORAL_TABLET | Freq: Every day | ORAL | 3 refills | Status: DC

## 2024-07-28 ENCOUNTER — Telehealth: Payer: Self-pay | Admitting: Nurse Practitioner

## 2024-07-28 NOTE — Telephone Encounter (Signed)
 Medical clearance signed. Faxed back to [facility]; [phone]. Scanned-Toni

## 2024-08-02 ENCOUNTER — Ambulatory Visit (INDEPENDENT_AMBULATORY_CARE_PROVIDER_SITE_OTHER): Admitting: Internal Medicine

## 2024-08-02 ENCOUNTER — Encounter: Payer: Self-pay | Admitting: Internal Medicine

## 2024-08-02 VITALS — BP 110/70 | HR 87 | Temp 98.0°F | Resp 16 | Ht 66.0 in | Wt 148.0 lb

## 2024-08-02 DIAGNOSIS — J449 Chronic obstructive pulmonary disease, unspecified: Secondary | ICD-10-CM

## 2024-08-02 DIAGNOSIS — N1831 Chronic kidney disease, stage 3a: Secondary | ICD-10-CM | POA: Diagnosis not present

## 2024-08-02 NOTE — Progress Notes (Signed)
 [facility] [address] [place], [place] [address]  Pulmonary Sleep Medicine   Office Visit Note  Patient Name: Daniel Hodges DOB: 1940/12/17 MRN [id number]  Date of Service: 08/02/2024  Complaints/HPI: He states he has some shortness of breath and states that he has been coughing. No blood in the sputum. Denies having chest pain. He does have wheeze at times. He had PFT done and this shows that he has SEVERE COPD. He currently does not have any inhalers. He states money is a factor and he has difficulty paying for the meds. Also he states he has difficulty with his going to the bathroom. Sounds like constipation is a major issues  Office Spirometry Results:     ROS  General: (-) fever, (-) chills, (-) night sweats, (-) weakness Skin: (-) rashes, (-) itching,. Eyes: (-) visual changes, (-) redness, (-) itching. Nose and Sinuses: (-) nasal stuffiness or itchiness, (-) postnasal drip, (-) nosebleeds, (-) sinus trouble. Mouth and Throat: (-) sore throat, (-) hoarseness. Neck: (-) swollen glands, (-) enlarged thyroid , (-) neck pain. Respiratory: - cough, (-) bloody sputum, + shortness of breath, - wheezing. Cardiovascular: - ankle swelling, (-) chest pain. Lymphatic: (-) lymph node enlargement. Neurologic: (-) numbness, (-) tingling. Psychiatric: (-) anxiety, (-) depression   Current Medication: Outpatient Encounter Medications as of 08/02/2024  Medication Sig   albuterol  (VENTOLIN  HFA) 108 (90 Base) MCG/ACT inhaler Inhale 2 puffs into the lungs every 6 (six) hours as needed for wheezing or shortness of breath.   amLODipine  (NORVASC ) 2.5 MG tablet TAKE 1 TABLET BY MOUTH DAILY   aspirin  81 MG tablet Take 81 mg by mouth daily.   atorvastatin  (LIPITOR) 20 MG tablet TAKE 1 TABLET BY MOUTH AT  BEDTIME   chlorpheniramine-HYDROcodone (TUSSIONEX) 10-8 MG/5ML Take 5 mLs by mouth every 12 (twelve) hours as needed for cough.   cholecalciferol  (VITAMIN D3) 25 MCG (1000  UT) tablet Take 1,000 Units by mouth daily.   Continuous Glucose Sensor (FREESTYLE LIBRE 2 SENSOR) MISC USE EVERY 14 DAYS AS DIRECTED   cyanocobalamin  (VITAMIN B12) 1000 MCG tablet Take 1 tablet (1,000 mcg total) by mouth daily.   docusate sodium  (COLACE) 50 MG capsule Take 1 capsule (50 mg total) by mouth 2 (two) times daily.   empagliflozin (JARDIANCE) 25 MG TABS tablet Take 1 tablet (25 mg total) by mouth daily.   empagliflozin (JARDIANCE) 25 MG TABS tablet Take 1 tablet (25 mg total) by mouth daily.   feeding supplement, GLUCERNA SHAKE, (GLUCERNA SHAKE) LIQD One a day   ferrous sulfate  324 MG TBEC Take by mouth.   Fluticasone -Umeclidin-Vilant (TRELEGY ELLIPTA ) 100-62.5-25 MCG/ACT AEPB Inhale 1 puff into the lungs daily.   furosemide  (LASIX ) 20 MG tablet TAKE 1 TABLET BY MOUTH DAILY   gabapentin  (NEURONTIN ) 300 MG capsule TAKE 1 CAPSULE BY MOUTH DAILY  AND 1 CAPSULE BY MOUTH AT NIGHT   glipiZIDE  (GLUCOTROL  XL) 5 MG 24 hr tablet TAKE 1 TABLET BY MOUTH DAILY  WITH BREAKFAST (DISCONTINUE  MOUNJARO )   HUMALOG  KWIKPEN 100 UNIT/ML KwikPen Inject insulin  into the skin with meals per sliding scale provided to patient. Max dose per 24 hours: 42 units.   hydrocortisone  1 % ointment Apply small amount mix with lotion at night for leg rash   ipratropium-albuterol  (DUONEB) 0.5-2.5 (3) MG/3ML SOLN USE 3 ML VIA NEBULIZER EVERY 6 HOURS AS NEEDED   loratadine  (CLARITIN ) 10 MG tablet Take 1 tablet (10 mg total) by mouth daily.   losartan  (COZAAR ) 25 MG  tablet TAKE 1 TABLET BY MOUTH DAILY   Magnesium  250 MG TABS Take 250 mg by mouth 2 (two) times daily.   montelukast  (SINGULAIR ) 10 MG tablet TAKE 1 TABLET BY MOUTH DAILY   mupirocin  ointment (BACTROBAN ) 2 % Apply 1 Application topically 2 (two) times daily. To superficial scratches on legs until resolved.   niacin  (NIASPAN ) 500 MG CR tablet niacin  ER 500 mg tablet,extended release 24 hr   pneumococcal 20-valent conjugate vaccine (PREVNAR 20) 0.5 ML injection  Inject 0.5 mLs into the muscle once as needed for up to 1 dose for immunization.   senna (SENOKOT) 8.6 MG tablet Take 1 tablet (8.6 mg total) by mouth daily.   simethicone  (MYLICON) 80 MG chewable tablet Chew 2 tablets (160 mg total) by mouth 2 (two) times daily.   Tiotropium Bromide-Olodaterol (STIOLTO RESPIMAT ) 2.5-2.5 MCG/ACT AERS Inhale 1 Inhalation into the lungs daily.   TOUJEO  SOLOSTAR 300 UNIT/ML Solostar Pen INJECT SUBCUTANEOUSLY 18 UNITS  AT BEDTIME   Zoster Vaccine Adjuvanted [facility]) injection Inject 0.5 mLs into the muscle once as needed for up to 1 dose (shingles vaccination).   No facility-administered encounter medications on file as of 08/02/2024.    Surgical History: Past Surgical History:  Procedure Laterality Date   APPENDECTOMY     COLONOSCOPY WITH PROPOFOL  N/A 06/11/2015   Procedure: COLONOSCOPY WITH PROPOFOL ;  Surgeon: Lamar ONEIDA Holmes, MD;  Location: [facility] ENDOSCOPY;  Service: Endoscopy;  Laterality: N/A;   CORONARY ARTERY BYPASS GRAFT     HERNIA REPAIR     TEE WITHOUT CARDIOVERSION     TRACHEOSTOMY     VASCULAR SURGERY      Medical History: Past Medical History:  Diagnosis Date   Acute metabolic encephalopathy 10/31/2022   Anginal pain    Asthma    Coronary artery disease    Diabetes mellitus without complication (HCC)    Hyperlipidemia    Hypertension    Hypoglycemia 11/02/2022   Ileus (HCC) 09/20/2013   Pleural effusion 08/06/2018   Postoperative anemia due to acute blood loss 09/06/2013   S/P appendectomy 09/01/2013   Severe sepsis (HCC) 10/31/2022   Sleep apnea    SOB (shortness of breath) 01/22/2018    Family History: Family History  Problem Relation Age of Onset   Cancer Sister    Diabetes Daughter    Diabetes Son     Social History: Social History   Socioeconomic History   Marital status: Married    Spouse name: Not on file   Number of children: Not on file   Years of education: Not on file   Highest education level: Not on  file  Occupational History   Not on file  Tobacco Use   Smoking status: Never   Smokeless tobacco: Never  Vaping Use   Vaping status: Never Used  Substance and Sexual Activity   Alcohol use: No   Drug use: No   Sexual activity: Not on file  Other Topics Concern   Not on file  Social History Narrative   Not on file   Social Drivers of Health   Financial Resource Strain: Not on file  Food Insecurity: No Food Insecurity (10/31/2022)   Hunger Vital Sign    Worried About Running Out of Food in the Last Year: Never true    Ran Out of Food in the Last Year: Never true  Transportation Needs: No Transportation Needs (10/31/2022)   PRAPARE - Transportation    Lack of Transportation (Medical): No    Lack  of Transportation (Non-Medical): No  Physical Activity: Not on file  Stress: Not on file  Social Connections: Not on file  Intimate Partner Violence: Not At Risk (10/31/2022)   Humiliation, Afraid, Rape, and Kick questionnaire    Fear of Current or Ex-Partner: No    Emotionally Abused: No    Physically Abused: No    Sexually Abused: No    Vital Signs: Blood pressure 110/70, pulse 87, temperature 98 F (36.7 C), resp. rate 16, height 5' 6 (1.676 m), weight 148 lb (67.1 kg), SpO2 97%.  Examination: General Appearance: The patient is well-developed, well-nourished, and in no distress. Skin: Gross inspection of skin unremarkable. Head: normocephalic, no gross deformities. Eyes: no gross deformities noted. ENT: ears appear grossly normal no exudates. Neck: Supple. No thyromegaly. No LAD. Respiratory: few rhonchi noted. Cardiovascular: Normal S1 and S2 without murmur or rub. Extremities: No cyanosis. pulses are equal. Neurologic: Alert and oriented. No involuntary movements.  LABS: Recent Results (from the past 2160 hours)  POCT glycosylated hemoglobin (Hb A1C)     Status: Abnormal   Collection Time: 07/13/24  9:29 AM  Result Value Ref Range   Hemoglobin A1C 8.6 (A) 4.0 - 5.6  %   HbA1c POC (<> result, manual entry)     HbA1c, POC (prediabetic range)     HbA1c, POC (controlled diabetic range)      Radiology: CT Chest Wo Contrast Result Date: 01/23/2023 CLINICAL DATA:  Chronic cough. Patient failed empiric treatment for respiratory illness. EXAM: CT CHEST WITHOUT CONTRAST TECHNIQUE: Multidetector CT imaging of the chest was performed following the standard protocol without IV contrast. RADIATION DOSE REDUCTION: This exam was performed according to the departmental dose-optimization program which includes automated exposure control, adjustment of the mA and/or kV according to patient size and/or use of iterative reconstruction technique. COMPARISON:  Prior chest radiographs, most recent dated 10/31/2022. Previous chest CT dated 01/07/2008. FINDINGS: Cardiovascular: Heart is normal in size and configuration. Previous CABG surgery. Coronary artery calcifications. No pericardial effusion. Great vessels are normal in caliber. Minor aortic atherosclerotic calcifications. Mediastinum/Nodes: Normal thyroid . No neck base, mediastinal or hilar masses. No enlarged lymph nodes. Trachea esophagus are unremarkable. Lungs/Pleura: Lower lung bronchial wall thickening, most evident in the lower lobes. There are additional opacities at the lung bases, predominantly linear/reticular, consistent with atelectasis/scarring. No focal lung consolidation to suggest pneumonia. No evidence of pulmonary edema. No pleural effusion or pneumothorax. Upper Abdomen: No acute abnormality. Musculoskeletal: No fracture or acute finding. No bone lesion. No chest wall mass. IMPRESSION: 1. No lung consolidation to suggest pneumonia. 2. Lower lung bronchial wall thickening, mostly in the lower lobes, consistent with bronchitis. Opacities at both lung bases are consistent with atelectasis. 3. Previous CABG surgery. Coronary artery calcifications and aortic atherosclerosis. Aortic Atherosclerosis (ICD10-I70.0).  Electronically Signed   By: Alm Parkins M.D.   On: 01/23/2023 14:39    No results found.  No results found.  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 COPD mixed type (HCC) (Primary) I gave him a sample of Breztri  and also instructed him on how to use it. My concern is proper inhaler usage and we may need to follow up his response to treatment  2. Stage 3a chronic kidney disease (HCC) Following labs per his PCP   General Counseling: I have discussed the findings of the evaluation and examination with Armoni.  I have also discussed any further diagnostic evaluation thatmay be needed or ordered today. Jeremy verbalizes understanding of the findings of todays visit. We also reviewed his medications today and discussed drug interactions and side effects including but not limited excessive drowsiness and altered mental states. We also discussed that there is always a risk not just to him but also people around him. he has been encouraged to call the office with any  questions or concerns that should arise related to todays visit.  No orders of the defined types were placed in this encounter.    Time spent: 69  I have personally obtained a history, examined the patient, evaluated laboratory and imaging results, formulated the assessment and plan and placed orders.    Elfreda DELENA Bathe, MD [facility] Pulmonary and Critical Care Sleep medicine

## 2024-08-02 NOTE — Patient Instructions (Signed)
 Asthma, Adult  Asthma is a condition that causes swelling and narrowing of the airways. These are the passages that lead from the nose and mouth down into the lungs. When asthma symptoms get worse it is called an asthma attack or flare. This can make it hard to breathe. Asthma flares can range from minor to life-threatening. There is no cure for asthma, but medicines and lifestyle changes can help to control it. What are the causes? It is not known exactly what causes asthma, but certain things can cause asthma symptoms to get worse (triggers). What can trigger an asthma attack? Cigarette smoke. Mold. Dust. Your pet's skin flakes (dander). Cockroaches. Pollen. Air pollution (like household cleaners, wood smoke, smog, or Therapist, occupational). What are the signs or symptoms? Trouble breathing (shortness of breath). Coughing. Making high-pitched whistling sounds when you breathe, most often when you breathe out (wheezing). Chest tightness. Tiredness with little activity. Poor exercise tolerance. How is this treated? Controller medicines that help prevent asthma symptoms. Fast-acting reliever or rescue medicines. These give short-term relief of asthma symptoms. Allergy medicines if your attacks are brought on by allergens. Medicines to help control the body's defense (immune) system. Staying away from the things that cause asthma attacks. Follow these instructions at home: Avoiding triggers in your home Do not allow anyone to smoke in your home. Limit use of fireplaces and wood stoves. Get rid of pests (such as roaches and mice) and their droppings. Keep your home clean. Clean your floors. Dust regularly. Use cleaning products that do not smell. Wash bed sheets and blankets every week in hot water. Dry them in a dryer. Have someone vacuum when you are not home. Change your heating and air conditioning filters often. Use blankets that are made of polyester or cotton. General  instructions Take over-the-counter and prescription medicines only as told by your doctor. Do not smoke or use any products that contain nicotine or tobacco. If you need help quitting, ask your doctor. Stay away from secondhand smoke. Avoid doing things outdoors when allergen counts are high and when air quality is low. Warm up before you exercise. Take time to cool down after exercise. Use a peak flow meter as told by your doctor. A peak flow meter is a tool that measures how well your lungs are working. Keep track of the peak flow meter's readings. Write them down. Follow your asthma action plan. This is a written plan for taking care of your asthma and treating your attacks. Make sure you get all the shots (vaccines) that your doctor recommends. Ask your doctor about a flu shot and a pneumonia shot. Keep all follow-up visits. Contact a doctor if: You have wheezing, shortness of breath, or a cough even while taking medicine to prevent attacks. The mucus you cough up (sputum) is thicker than usual. The mucus you cough up changes from clear or white to yellow, green, gray, or is bloody. You have problems from the medicine you are taking, such as: A rash. Itching. Swelling. Trouble breathing. You need reliever medicines more than 2-3 times a week. Your peak flow reading is still at 50-79% of your personal best after following the action plan for 1 hour. You have a fever. Get help right away if: You seem to be worse and are not responding to medicine during an asthma attack. You are short of breath even at rest. You get short of breath when doing very little activity. You have trouble eating, drinking, or talking. You have chest  pain or tightness. You have a fast heartbeat. Your lips or fingernails start to turn blue. You are light-headed or dizzy, or you faint. Your peak flow is less than 50% of your personal best. You feel too tired to breathe normally. These symptoms may be an  emergency. Get help right away. Call 911. Do not wait to see if the symptoms will go away. Do not drive yourself to the hospital. Summary Asthma is a long-term (chronic) condition in which the airways get tight and narrow. An asthma attack can make it hard to breathe. Asthma cannot be cured, but medicines and lifestyle changes can help control it. Make sure you understand how to avoid triggers and how and when to use your medicines. Avoid things that can cause allergy symptoms (allergens). These include animal skin flakes (dander) and pollen from trees or grass. Avoid things that pollute the air. These may include household cleaners, wood smoke, smog, or chemical odors. This information is not intended to replace advice given to you by your health care provider. Make sure you discuss any questions you have with your health care provider. Document Revised: 06/10/2021 Document Reviewed: 06/10/2021 Elsevier Patient Education  2024 [employer].

## 2024-08-08 ENCOUNTER — Ambulatory Visit (INDEPENDENT_AMBULATORY_CARE_PROVIDER_SITE_OTHER): Admitting: Internal Medicine

## 2024-08-08 ENCOUNTER — Encounter: Payer: Self-pay | Admitting: Internal Medicine

## 2024-08-08 VITALS — BP 110/75 | HR 88 | Temp 98.0°F | Resp 16 | Ht 66.0 in | Wt 146.0 lb

## 2024-08-08 DIAGNOSIS — R634 Abnormal weight loss: Secondary | ICD-10-CM

## 2024-08-08 DIAGNOSIS — Z794 Long term (current) use of insulin: Secondary | ICD-10-CM

## 2024-08-08 DIAGNOSIS — E1122 Type 2 diabetes mellitus with diabetic chronic kidney disease: Secondary | ICD-10-CM

## 2024-08-08 DIAGNOSIS — N1831 Chronic kidney disease, stage 3a: Secondary | ICD-10-CM

## 2024-08-08 MED ORDER — FREESTYLE LIBRE 2 SENSOR MISC: 3 refills | Status: AC

## 2024-08-08 MED ORDER — GLIMEPIRIDE 2 MG PO TABS: ORAL_TABLET | ORAL | 3 refills | Status: DC

## 2024-08-08 NOTE — Progress Notes (Signed)
 [facility] [address] [place], [place] [address]  Internal MEDICINE  Office Visit Note  Patient Name: Daniel Hodges  [id number]  [id number]  Date of Service: 08/08/2024  Chief Complaint  Patient presents with   Follow-up    Diabetes, HTN, Hyperlipidemia   Quality Metric Gaps    Eye Exam, Pneumonia and Shingles vaccines   Medication Refill    FreeStyle Libre 2 Sensor 90 day supply    HPI Pt is seen today for follow up on diabetes and wt loss  He was taken off of Ozempic  and Glucotrol  and meal time insulin  due to risk of hypoglycemia and ongoing weight loss  He has been taking Toujeo  18 units   Jardiance  was also added  He did lose 2 more lbs since last visit     Current Medication: Outpatient Encounter Medications as of 08/08/2024  Medication Sig   albuterol  (VENTOLIN  HFA) 108 (90 Base) MCG/ACT inhaler Inhale 2 puffs into the lungs every 6 (six) hours as needed for wheezing or shortness of breath.   amLODipine  (NORVASC ) 2.5 MG tablet TAKE 1 TABLET BY MOUTH DAILY   aspirin  81 MG tablet Take 81 mg by mouth daily.   atorvastatin  (LIPITOR) 20 MG tablet TAKE 1 TABLET BY MOUTH AT  BEDTIME   chlorpheniramine-HYDROcodone (TUSSIONEX) 10-8 MG/5ML Take 5 mLs by mouth every 12 (twelve) hours as needed for cough.   cholecalciferol  (VITAMIN D3) 25 MCG (1000 UT) tablet Take 1,000 Units by mouth daily.   cyanocobalamin  (VITAMIN B12) 1000 MCG tablet Take 1 tablet (1,000 mcg total) by mouth daily.   docusate sodium  (COLACE) 50 MG capsule Take 1 capsule (50 mg total) by mouth 2 (two) times daily.   empagliflozin  (JARDIANCE ) 25 MG TABS tablet Take 1 tablet (25 mg total) by mouth daily.   feeding supplement, GLUCERNA SHAKE, (GLUCERNA SHAKE) LIQD One a day   ferrous sulfate  324 MG TBEC Take by mouth.   Fluticasone -Umeclidin-Vilant (TRELEGY ELLIPTA ) 100-62.5-25 MCG/ACT AEPB Inhale 1 puff into the lungs daily.   furosemide  (LASIX ) 20 MG tablet TAKE 1 TABLET BY MOUTH DAILY    gabapentin  (NEURONTIN ) 300 MG capsule TAKE 1 CAPSULE BY MOUTH DAILY  AND 1 CAPSULE BY MOUTH AT NIGHT   glimepiride  (AMARYL ) 2 MG tablet Take one tab in am with breakfast and one with dinner for diabetes   hydrocortisone  1 % ointment Apply small amount mix with lotion at night for leg rash   ipratropium-albuterol  (DUONEB) 0.5-2.5 (3) MG/3ML SOLN USE 3 ML VIA NEBULIZER EVERY 6 HOURS AS NEEDED   loratadine  (CLARITIN ) 10 MG tablet Take 1 tablet (10 mg total) by mouth daily.   losartan  (COZAAR ) 25 MG tablet TAKE 1 TABLET BY MOUTH DAILY   Magnesium  250 MG TABS Take 250 mg by mouth 2 (two) times daily.   montelukast  (SINGULAIR ) 10 MG tablet TAKE 1 TABLET BY MOUTH DAILY   mupirocin  ointment (BACTROBAN ) 2 % Apply 1 Application topically 2 (two) times daily. To superficial scratches on legs until resolved.   niacin  (NIASPAN ) 500 MG CR tablet niacin  ER 500 mg tablet,extended release 24 hr   pneumococcal 20-valent conjugate vaccine (PREVNAR 20) 0.5 ML injection Inject 0.5 mLs into the muscle once as needed for up to 1 dose for immunization.   senna (SENOKOT) 8.6 MG tablet Take 1 tablet (8.6 mg total) by mouth daily.   simethicone  (MYLICON) 80 MG chewable tablet Chew 2 tablets (160 mg total) by mouth 2 (two) times daily.   Tiotropium Bromide-Olodaterol (STIOLTO  RESPIMAT) 2.5-2.5 MCG/ACT AERS Inhale 1 Inhalation into the lungs daily.   TOUJEO  SOLOSTAR 300 UNIT/ML Solostar Pen INJECT SUBCUTANEOUSLY 18 UNITS  AT BEDTIME   Zoster Vaccine Adjuvanted [facility]) injection Inject 0.5 mLs into the muscle once as needed for up to 1 dose (shingles vaccination).   [DISCONTINUED] Continuous Glucose Sensor (FREESTYLE LIBRE 2 SENSOR) MISC USE EVERY 14 DAYS AS DIRECTED   [DISCONTINUED] empagliflozin  (JARDIANCE ) 25 MG TABS tablet Take 1 tablet (25 mg total) by mouth daily.   [DISCONTINUED] glipiZIDE  (GLUCOTROL  XL) 5 MG 24 hr tablet TAKE 1 TABLET BY MOUTH DAILY  WITH BREAKFAST (DISCONTINUE  MOUNJARO )   [DISCONTINUED] HUMALOG   KWIKPEN 100 UNIT/ML KwikPen Inject insulin  into the skin with meals per sliding scale provided to patient. Max dose per 24 hours: 42 units.   Continuous Glucose Sensor (FREESTYLE LIBRE 2 SENSOR) MISC USE EVERY 14 DAYS AS DIRECTED   No facility-administered encounter medications on file as of 08/08/2024.    Surgical History: Past Surgical History:  Procedure Laterality Date   APPENDECTOMY     COLONOSCOPY WITH PROPOFOL  N/A 06/11/2015   Procedure: COLONOSCOPY WITH PROPOFOL ;  Surgeon: Lamar ONEIDA Holmes, MD;  Location: [facility] ENDOSCOPY;  Service: Endoscopy;  Laterality: N/A;   CORONARY ARTERY BYPASS GRAFT     HERNIA REPAIR     TEE WITHOUT CARDIOVERSION     TRACHEOSTOMY     VASCULAR SURGERY      Medical History: Past Medical History:  Diagnosis Date   Acute metabolic encephalopathy 10/31/2022   Anginal pain    Asthma    Coronary artery disease    Diabetes mellitus without complication (HCC)    Hyperlipidemia    Hypertension    Hypoglycemia 11/02/2022   Ileus (HCC) 09/20/2013   Pleural effusion 08/06/2018   Postoperative anemia due to acute blood loss 09/06/2013   S/P appendectomy 09/01/2013   Severe sepsis (HCC) 10/31/2022   Sleep apnea    SOB (shortness of breath) 01/22/2018    Family History: Family History  Problem Relation Age of Onset   Cancer Sister    Diabetes Daughter    Diabetes Son     Social History   Socioeconomic History   Marital status: Married    Spouse name: Not on file   Number of children: Not on file   Years of education: Not on file   Highest education level: Not on file  Occupational History   Not on file  Tobacco Use   Smoking status: Never   Smokeless tobacco: Never  Vaping Use   Vaping status: Never Used  Substance and Sexual Activity   Alcohol use: No   Drug use: No   Sexual activity: Not on file  Other Topics Concern   Not on file  Social History Narrative   Not on file   Social Drivers of Health   Financial Resource Strain:  Not on file  Food Insecurity: No Food Insecurity (10/31/2022)   Hunger Vital Sign    Worried About Running Out of Food in the Last Year: Never true    Ran Out of Food in the Last Year: Never true  Transportation Needs: No Transportation Needs (10/31/2022)   PRAPARE - Administrator, Civil Service (Medical): No    Lack of Transportation (Non-Medical): No  Physical Activity: Not on file  Stress: Not on file  Social Connections: Not on file  Intimate Partner Violence: Not At Risk (10/31/2022)   Humiliation, Afraid, Rape, and Kick questionnaire    Fear  of Current or Ex-Partner: No    Emotionally Abused: No    Physically Abused: No    Sexually Abused: No      Review of Systems  Constitutional:  Negative for fatigue and fever.  HENT:  Negative for congestion, mouth sores and postnasal drip.   Respiratory:  Negative for cough.   Cardiovascular:  Negative for chest pain.  Genitourinary:  Negative for flank pain.  Psychiatric/Behavioral: Negative.      Vital Signs: BP 110/75   Pulse 88   Temp 98 F (36.7 C)   Resp 16   Ht 5' 6 (1.676 m)   Wt 146 lb (66.2 kg)   SpO2 97%   BMI 23.57 kg/m    Physical Exam Constitutional:      Appearance: Normal appearance.  HENT:     Head: Normocephalic and atraumatic.     Nose: Nose normal.     Mouth/Throat:     Mouth: Mucous membranes are moist.     Pharynx: No posterior oropharyngeal erythema.  Eyes:     Extraocular Movements: Extraocular movements intact.     Pupils: Pupils are equal, round, and reactive to light.  Cardiovascular:     Pulses: Normal pulses.     Heart sounds: Normal heart sounds.  Pulmonary:     Effort: Pulmonary effort is normal.     Breath sounds: Normal breath sounds.  Neurological:     General: No focal deficit present.     Mental Status: He is alert.  Psychiatric:        Mood and Affect: Mood normal.        Behavior: Behavior normal.        Assessment/Plan: 1. Type 2 diabetes mellitus  with stage 3a chronic kidney disease, with long-term current use of insulin  (HCC) (Primary) Compliance with current regimen is discussed and instructed pt not to inject extra insulin , proper adjustment in his insulin  dose will be done at time of visit.  Toujeo  is increased to 24 units with Lunch ( after taking his meal intake history)  Amaryl  2 mg po bid added per prescription Continue Jardiance  25 mg po every day is given with 3 boxes of samples  Hand written note is also given to the pt for better comprehension and understanding  - Continuous Glucose Sensor (FREESTYLE LIBRE 2 SENSOR) MISC; USE EVERY 14 DAYS AS DIRECTED  Dispense: 6 each; Refill: 3 - glimepiride  (AMARYL ) 2 MG tablet; Take one tab in am with breakfast and one with dinner for diabetes  Dispense: 100 tablet; Refill: 3  2. Stage 3a chronic kidney disease (HCC) Will monitor for now   3. Weight loss, non-intentional Keeping an eye on it for now    General Counseling: Marteze verbalizes understanding of the findings of todays visit and agrees with plan of treatment. I have discussed any further diagnostic evaluation that may be needed or ordered today. We also reviewed his medications today. he has been encouraged to call the office with any questions or concerns that should arise related to todays visit.    No orders of the defined types were placed in this encounter.   Meds ordered this encounter  Medications   Continuous Glucose Sensor (FREESTYLE LIBRE 2 SENSOR) MISC    Sig: USE EVERY 14 DAYS AS DIRECTED    Dispense:  6 each    Refill:  3   glimepiride  (AMARYL ) 2 MG tablet    Sig: Take one tab in am with breakfast and one with dinner  for diabetes    Dispense:  100 tablet    Refill:  3    Total time spent:30 Minutes Time spent includes review of chart, medications, test results, and follow up plan with the patient.   [place] Controlled Substance Database was reviewed by me.   Dr Marleigh Kaylor M Gorman [facility] Internal medicine

## 2024-08-09 ENCOUNTER — Other Ambulatory Visit: Payer: Self-pay

## 2024-08-09 DIAGNOSIS — E1122 Type 2 diabetes mellitus with diabetic chronic kidney disease: Secondary | ICD-10-CM

## 2024-08-09 MED ORDER — GLIMEPIRIDE 2 MG PO TABS: ORAL_TABLET | ORAL | 0 refills | Status: AC

## 2024-08-10 ENCOUNTER — Telehealth: Payer: Self-pay

## 2024-08-10 NOTE — Telephone Encounter (Signed)
 Completed P.A. and faxed appeal for patient's FreeStyle Libre 2 Sensor.

## 2024-08-12 NOTE — Procedures (Signed)
 [facility] [address] [place], [address]    Complete Pulmonary Function Testing Interpretation:  FINDINGS:  The forced vital capacity is moderately decreased.  The FEV1 is 1.11 L which is 45% of predicted and is severely decreased.  FEV1 FVC ratio was decreased.  Postbronchodilator there was no significant change in FEV1.  Total lung capacity is mildly decreased.  Residual volume is normal.  FRC is normal.  Residual volume total capacity ratio was increased.  DLCO was within normal limits.  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  Daniel DELENA Bathe, MD [facility] Pulmonary Critical Care Medicine Sleep Medicine

## 2024-08-22 ENCOUNTER — Other Ambulatory Visit: Payer: Self-pay

## 2024-08-22 ENCOUNTER — Telehealth: Payer: Self-pay | Admitting: Nurse Practitioner

## 2024-08-22 ENCOUNTER — Telehealth: Admitting: Internal Medicine

## 2024-08-22 DIAGNOSIS — E1165 Type 2 diabetes mellitus with hyperglycemia: Secondary | ICD-10-CM

## 2024-08-22 DIAGNOSIS — E1122 Type 2 diabetes mellitus with diabetic chronic kidney disease: Secondary | ICD-10-CM

## 2024-08-22 MED ORDER — TOUJEO SOLOSTAR 300 UNIT/ML ~~LOC~~ SOPN: PEN_INJECTOR | SUBCUTANEOUS | 2 refills | Status: AC

## 2024-08-22 NOTE — Telephone Encounter (Signed)
 Left vm to schedule today's 5 wk follow up-Toni

## 2024-08-22 NOTE — Progress Notes (Signed)
 [facility] [address] [place], [place] [address]  Internal MEDICINE  Telephone Visit  Patient Name: Daniel Hodges  [id number]  [id number]  Date of Service: 08/29/2024  I connected with the patient at 240 pm by telephone and verified the patients identity using two identifiers.   I discussed the limitations, risks, security and privacy concerns of performing an evaluation and management service by telephone and the availability of in person appointments. I also discussed with the patient that there may be a patient responsible charge related to the service.  The patient expressed understanding and agrees to proceed.    Chief Complaint  Patient presents with   Telephone Screen    [phone]   Telephone Assessment    Follow up     HPI  Pt is connected for follow up on his diabetic management, he seems to be confused about his medications Pt is not a candidate for GLP1 due to wt loss and other side effects  He is on basal insulin  ( Toujeo ), Amaryl  and Jardiance , mealtime insulin  was stopped due to risk of hypoglycemia  He is not sure about his inhalers   Current Medication: Outpatient Encounter Medications as of 08/22/2024  Medication Sig   albuterol  (VENTOLIN  HFA) 108 (90 Base) MCG/ACT inhaler Inhale 2 puffs into the lungs every 6 (six) hours as needed for wheezing or shortness of breath.   amLODipine  (NORVASC ) 2.5 MG tablet TAKE 1 TABLET BY MOUTH DAILY   aspirin  81 MG tablet Take 81 mg by mouth daily.   atorvastatin  (LIPITOR) 20 MG tablet TAKE 1 TABLET BY MOUTH AT  BEDTIME   chlorpheniramine-HYDROcodone (TUSSIONEX) 10-8 MG/5ML Take 5 mLs by mouth every 12 (twelve) hours as needed for cough.   cholecalciferol  (VITAMIN D3) 25 MCG (1000 UT) tablet Take 1,000 Units by mouth daily.   Continuous Glucose Sensor (FREESTYLE LIBRE 2 SENSOR) MISC USE EVERY 14 DAYS AS DIRECTED   cyanocobalamin  (VITAMIN B12) 1000 MCG tablet Take 1 tablet (1,000 mcg total) by mouth daily.    docusate sodium  (COLACE) 50 MG capsule Take 1 capsule (50 mg total) by mouth 2 (two) times daily.   empagliflozin  (JARDIANCE ) 25 MG TABS tablet Take 1 tablet (25 mg total) by mouth daily.   feeding supplement, GLUCERNA SHAKE, (GLUCERNA SHAKE) LIQD One a day   ferrous sulfate  324 MG TBEC Take by mouth.   Fluticasone -Umeclidin-Vilant (TRELEGY ELLIPTA ) 100-62.5-25 MCG/ACT AEPB Inhale 1 puff into the lungs daily.   furosemide  (LASIX ) 20 MG tablet TAKE 1 TABLET BY MOUTH DAILY   gabapentin  (NEURONTIN ) 300 MG capsule TAKE 1 CAPSULE BY MOUTH DAILY  AND 1 CAPSULE BY MOUTH AT NIGHT   glimepiride  (AMARYL ) 2 MG tablet Take one tab in am with breakfast and one with dinner for diabetes   hydrocortisone  1 % ointment Apply small amount mix with lotion at night for leg rash   ipratropium-albuterol  (DUONEB) 0.5-2.5 (3) MG/3ML SOLN USE 3 ML VIA NEBULIZER EVERY 6 HOURS AS NEEDED   loratadine  (CLARITIN ) 10 MG tablet Take 1 tablet (10 mg total) by mouth daily.   losartan  (COZAAR ) 25 MG tablet TAKE 1 TABLET BY MOUTH DAILY   Magnesium  250 MG TABS Take 250 mg by mouth 2 (two) times daily.   montelukast  (SINGULAIR ) 10 MG tablet TAKE 1 TABLET BY MOUTH DAILY   mupirocin  ointment (BACTROBAN ) 2 % Apply 1 Application topically 2 (two) times daily. To superficial scratches on legs until resolved.   niacin  (NIASPAN ) 500 MG CR tablet niacin  ER 500  mg tablet,extended release 24 hr   pneumococcal 20-valent conjugate vaccine (PREVNAR 20) 0.5 ML injection Inject 0.5 mLs into the muscle once as needed for up to 1 dose for immunization.   senna (SENOKOT) 8.6 MG tablet Take 1 tablet (8.6 mg total) by mouth daily.   simethicone  (MYLICON) 80 MG chewable tablet Chew 2 tablets (160 mg total) by mouth 2 (two) times daily.   Tiotropium Bromide-Olodaterol (STIOLTO RESPIMAT ) 2.5-2.5 MCG/ACT AERS Inhale 1 Inhalation into the lungs daily.   Zoster Vaccine Adjuvanted [facility]) injection Inject 0.5 mLs into the muscle once as needed for up to 1  dose (shingles vaccination).   [DISCONTINUED] TOUJEO  SOLOSTAR 300 UNIT/ML Solostar Pen INJECT SUBCUTANEOUSLY 18 UNITS  AT BEDTIME   TOUJEO  SOLOSTAR 300 UNIT/ML Solostar Pen Take 24 units at lunch everyday   No facility-administered encounter medications on file as of 08/22/2024.    Surgical History: Past Surgical History:  Procedure Laterality Date   APPENDECTOMY     COLONOSCOPY WITH PROPOFOL  N/A 06/11/2015   Procedure: COLONOSCOPY WITH PROPOFOL ;  Surgeon: Lamar ONEIDA Holmes, MD;  Location: [facility] ENDOSCOPY;  Service: Endoscopy;  Laterality: N/A;   CORONARY ARTERY BYPASS GRAFT     HERNIA REPAIR     TEE WITHOUT CARDIOVERSION     TRACHEOSTOMY     VASCULAR SURGERY      Medical History: Past Medical History:  Diagnosis Date   Acute metabolic encephalopathy 10/31/2022   Anginal pain    Asthma    Coronary artery disease    Diabetes mellitus without complication (HCC)    Hyperlipidemia    Hypertension    Hypoglycemia 11/02/2022   Ileus (HCC) 09/20/2013   Pleural effusion 08/06/2018   Postoperative anemia due to acute blood loss 09/06/2013   S/P appendectomy 09/01/2013   Severe sepsis (HCC) 10/31/2022   Sleep apnea    SOB (shortness of breath) 01/22/2018    Family History: Family History  Problem Relation Age of Onset   Cancer Sister    Diabetes Daughter    Diabetes Son     Social History   Socioeconomic History   Marital status: Married    Spouse name: Not on file   Number of children: Not on file   Years of education: Not on file   Highest education level: Not on file  Occupational History   Not on file  Tobacco Use   Smoking status: Never   Smokeless tobacco: Never  Vaping Use   Vaping status: Never Used  Substance and Sexual Activity   Alcohol use: No   Drug use: No   Sexual activity: Not on file  Other Topics Concern   Not on file  Social History Narrative   Not on file   Social Drivers of Health   Tobacco Use: Low Risk  (08/22/2024)   [facility] from  [facility]   Patient History    Smoking Tobacco Use: Never    Smokeless Tobacco Use: Never    Passive Exposure: Not on file  Financial Resource Strain: Not on file  Food Insecurity: No Food Insecurity (10/31/2022)   Hunger Vital Sign    Worried About Running Out of Food in the Last Year: Never true    Ran Out of Food in the Last Year: Never true  Transportation Needs: No Transportation Needs (10/31/2022)   PRAPARE - Administrator, Civil Service (Medical): No    Lack of Transportation (Non-Medical): No  Physical Activity: Not on file  Stress: Not on  file  Social Connections: Not on file  Intimate Partner Violence: Not At Risk (10/31/2022)   Humiliation, Afraid, Rape, and Kick questionnaire    Fear of Current or Ex-Partner: No    Emotionally Abused: No    Physically Abused: No    Sexually Abused: No  Depression (PHQ2-9): Low Risk (07/26/2024)   Depression (PHQ2-9)    PHQ-2 Score: 0  Alcohol Screen: Low Risk (09/19/2021)   Alcohol Screen    Last Alcohol Screening Score (AUDIT): 0  Housing: Unknown (02/25/2024)   Received from [facility]   Epic    Unable to Pay for Housing in the Last Year: Not on file    Number of Times Moved in the Last Year: Not on file    At any time in the past 12 months, were you homeless or living in a shelter (including now)?: No  Utilities: Not At Risk (10/31/2022)   [employer] Utilities    Threatened with loss of utilities: No  Health Literacy: Not on file      Review of Systems  Constitutional:  Negative for fatigue and fever.  HENT:  Negative for congestion, mouth sores and postnasal drip.   Respiratory:  Negative for cough.   Cardiovascular:  Negative for chest pain.  Genitourinary:  Negative for flank pain.  Psychiatric/Behavioral: Negative.      Vital Signs: There were no vitals taken for this visit.   Observation/Objective: NAD    Assessment/Plan: 1. Type 2 diabetes mellitus with  hyperglycemia, with long-term current use of insulin  (HCC) (Primary) Increase Toujeo  24 units,Amaryl  2 mg and Jardiance  25 mg po every day   - TOUJEO  SOLOSTAR 300 UNIT/ML Solostar Pen; Take 24 units at lunch everyday  Dispense: 9 mL; Refill: 2  2. Weight loss, non-intentional Will monitor for now, add Glucerna   3. Aortic atherosclerosis Niaspan  and Lipitor as before   4. Other emphysema (HCC) Need to check on her inhalers    General Counseling: Ollis verbalizes understanding of the findings of today's phone visit and agrees with plan of treatment. I have discussed any further diagnostic evaluation that may be needed or ordered today. We also reviewed his medications today. he has been encouraged to call the office with any questions or concerns that should arise related to todays visit.    No orders of the defined types were placed in this encounter.   Meds ordered this encounter  Medications   TOUJEO  SOLOSTAR 300 UNIT/ML Solostar Pen    Sig: Take 24 units at lunch everyday    Dispense:  9 mL    Refill:  2    Please send a replace/new response with 100-Day Supply if appropriate to maximize member benefit. Requesting 1 year supply.    Time spent:25 Minutes    Dr Sigrid CHRISTELLA Bathe Internal medicine

## 2024-08-23 ENCOUNTER — Telehealth: Payer: Self-pay | Admitting: Internal Medicine

## 2024-08-23 NOTE — Telephone Encounter (Signed)
 Left another vm w/ patient to schedule follow up appointment-Toni

## 2024-08-25 ENCOUNTER — Telehealth: Payer: Self-pay | Admitting: Nurse Practitioner

## 2024-08-25 NOTE — Telephone Encounter (Signed)
 Left 3rd vm to schedule follow up-Toni

## 2024-10-03 LAB — PULMONARY FUNCTION TEST

## 2024-10-04 ENCOUNTER — Encounter: Payer: Self-pay | Admitting: Internal Medicine

## 2024-10-04 ENCOUNTER — Ambulatory Visit (INDEPENDENT_AMBULATORY_CARE_PROVIDER_SITE_OTHER): Admitting: Internal Medicine

## 2024-10-04 VITALS — BP 122/60 | HR 87 | Temp 98.0°F | Resp 16 | Ht 66.0 in | Wt 141.0 lb

## 2024-10-04 DIAGNOSIS — R634 Abnormal weight loss: Secondary | ICD-10-CM | POA: Diagnosis not present

## 2024-10-04 DIAGNOSIS — E785 Hyperlipidemia, unspecified: Secondary | ICD-10-CM | POA: Diagnosis not present

## 2024-10-04 DIAGNOSIS — Z794 Long term (current) use of insulin: Secondary | ICD-10-CM

## 2024-10-04 DIAGNOSIS — I700 Atherosclerosis of aorta: Secondary | ICD-10-CM | POA: Diagnosis not present

## 2024-10-04 DIAGNOSIS — E1169 Type 2 diabetes mellitus with other specified complication: Secondary | ICD-10-CM | POA: Diagnosis not present

## 2024-10-04 DIAGNOSIS — E1122 Type 2 diabetes mellitus with diabetic chronic kidney disease: Secondary | ICD-10-CM

## 2024-10-04 DIAGNOSIS — N1831 Chronic kidney disease, stage 3a: Secondary | ICD-10-CM | POA: Diagnosis not present

## 2024-10-04 LAB — GLUCOSE, POCT (MANUAL RESULT ENTRY): POC Glucose: 248 mg/dL — AB (ref 70–99)

## 2024-10-04 NOTE — Progress Notes (Signed)
 [facility] [address] [place], [place] [address]  Internal MEDICINE  Office Visit Note  Patient Name: Daniel Hodges  [id number]  [id number]  Date of Service: 10/04/2024  Chief Complaint  Patient presents with   Follow-up    Diabetes, HTN    HPI Patient is seen for routine follow-up Patient continues to be confused about his medication, he is supposed to take his Lantus  at lunchtime however he admits to taking it with dinner, he does complain of a.m. hypoglycemia He was unable to obtain his medications today, he lives with his wife who also is physically limited due to her recent surgery Denies any other problems    Current Medication: Outpatient Encounter Medications as of 10/04/2024  Medication Sig   albuterol  (VENTOLIN  HFA) 108 (90 Base) MCG/ACT inhaler Inhale 2 puffs into the lungs every 6 (six) hours as needed for wheezing or shortness of breath.   amLODipine  (NORVASC ) 2.5 MG tablet TAKE 1 TABLET BY MOUTH DAILY   aspirin  81 MG tablet Take 81 mg by mouth daily.   atorvastatin  (LIPITOR) 20 MG tablet TAKE 1 TABLET BY MOUTH AT  BEDTIME   chlorpheniramine-HYDROcodone (TUSSIONEX) 10-8 MG/5ML Take 5 mLs by mouth every 12 (twelve) hours as needed for cough.   cholecalciferol  (VITAMIN D3) 25 MCG (1000 UT) tablet Take 1,000 Units by mouth daily.   Continuous Glucose Sensor (FREESTYLE LIBRE 2 SENSOR) MISC USE EVERY 14 DAYS AS DIRECTED   cyanocobalamin  (VITAMIN B12) 1000 MCG tablet Take 1 tablet (1,000 mcg total) by mouth daily.   docusate sodium  (COLACE) 50 MG capsule Take 1 capsule (50 mg total) by mouth 2 (two) times daily.   empagliflozin  (JARDIANCE ) 25 MG TABS tablet Take 1 tablet (25 mg total) by mouth daily.   feeding supplement, GLUCERNA SHAKE, (GLUCERNA SHAKE) LIQD One a day   ferrous sulfate  324 MG TBEC Take by mouth.   Fluticasone -Umeclidin-Vilant (TRELEGY ELLIPTA ) 100-62.5-25 MCG/ACT AEPB Inhale 1 puff into the lungs daily.   furosemide  (LASIX ) 20 MG  tablet TAKE 1 TABLET BY MOUTH DAILY   gabapentin  (NEURONTIN ) 300 MG capsule TAKE 1 CAPSULE BY MOUTH DAILY  AND 1 CAPSULE BY MOUTH AT NIGHT   glimepiride  (AMARYL ) 2 MG tablet Take one tab in am with breakfast and one with dinner for diabetes   hydrocortisone  1 % ointment Apply small amount mix with lotion at night for leg rash   ipratropium-albuterol  (DUONEB) 0.5-2.5 (3) MG/3ML SOLN USE 3 ML VIA NEBULIZER EVERY 6 HOURS AS NEEDED   loratadine  (CLARITIN ) 10 MG tablet Take 1 tablet (10 mg total) by mouth daily.   losartan  (COZAAR ) 25 MG tablet TAKE 1 TABLET BY MOUTH DAILY   Magnesium  250 MG TABS Take 250 mg by mouth 2 (two) times daily.   montelukast  (SINGULAIR ) 10 MG tablet TAKE 1 TABLET BY MOUTH DAILY   mupirocin  ointment (BACTROBAN ) 2 % Apply 1 Application topically 2 (two) times daily. To superficial scratches on legs until resolved.   niacin  (NIASPAN ) 500 MG CR tablet niacin  ER 500 mg tablet,extended release 24 hr   pneumococcal 20-valent conjugate vaccine (PREVNAR 20) 0.5 ML injection Inject 0.5 mLs into the muscle once as needed for up to 1 dose for immunization.   senna (SENOKOT) 8.6 MG tablet Take 1 tablet (8.6 mg total) by mouth daily.   simethicone  (MYLICON) 80 MG chewable tablet Chew 2 tablets (160 mg total) by mouth 2 (two) times daily.   Tiotropium Bromide-Olodaterol (STIOLTO RESPIMAT ) 2.5-2.5 MCG/ACT AERS Inhale 1 Inhalation into  the lungs daily.   TOUJEO  SOLOSTAR 300 UNIT/ML Solostar Pen Take 24 units at lunch everyday   Zoster Vaccine Adjuvanted [facility]) injection Inject 0.5 mLs into the muscle once as needed for up to 1 dose (shingles vaccination).   No facility-administered encounter medications on file as of 10/04/2024.    Surgical History: Past Surgical History:  Procedure Laterality Date   APPENDECTOMY     COLONOSCOPY WITH PROPOFOL  N/A 06/11/2015   Procedure: COLONOSCOPY WITH PROPOFOL ;  Surgeon: Lamar ONEIDA Holmes, MD;  Location: [facility] ENDOSCOPY;  Service: Endoscopy;   Laterality: N/A;   CORONARY ARTERY BYPASS GRAFT     HERNIA REPAIR     TEE WITHOUT CARDIOVERSION     TRACHEOSTOMY     VASCULAR SURGERY      Medical History: Past Medical History:  Diagnosis Date   Acute metabolic encephalopathy 10/31/2022   Anginal pain    Asthma    Coronary artery disease    Diabetes mellitus without complication (HCC)    Hyperlipidemia    Hypertension    Hypoglycemia 11/02/2022   Ileus (HCC) 09/20/2013   Pleural effusion 08/06/2018   Postoperative anemia due to acute blood loss 09/06/2013   S/P appendectomy 09/01/2013   Severe sepsis (HCC) 10/31/2022   Sleep apnea    SOB (shortness of breath) 01/22/2018    Family History: Family History  Problem Relation Age of Onset   Cancer Sister    Diabetes Daughter    Diabetes Son     Social History   Socioeconomic History   Marital status: Married    Spouse name: Not on file   Number of children: Not on file   Years of education: Not on file   Highest education level: Not on file  Occupational History   Not on file  Tobacco Use   Smoking status: Never   Smokeless tobacco: Never  Vaping Use   Vaping status: Never Used  Substance and Sexual Activity   Alcohol use: No   Drug use: No   Sexual activity: Not on file  Other Topics Concern   Not on file  Social History Narrative   Not on file   Social Drivers of Health   Tobacco Use: Low Risk (10/04/2024)   Patient History    Smoking Tobacco Use: Never    Smokeless Tobacco Use: Never    Passive Exposure: Not on file  Financial Resource Strain: Not on file  Food Insecurity: No Food Insecurity (10/31/2022)   Hunger Vital Sign    Worried About Running Out of Food in the Last Year: Never true    Ran Out of Food in the Last Year: Never true  Transportation Needs: No Transportation Needs (10/31/2022)   PRAPARE - Administrator, Civil Service (Medical): No    Lack of Transportation (Non-Medical): No  Physical Activity: Not on file  Stress:  Not on file  Social Connections: Not on file  Intimate Partner Violence: Not At Risk (10/31/2022)   Humiliation, Afraid, Rape, and Kick questionnaire    Fear of Current or Ex-Partner: No    Emotionally Abused: No    Physically Abused: No    Sexually Abused: No  Depression (PHQ2-9): Low Risk (10/04/2024)   Depression (PHQ2-9)    PHQ-2 Score: 0  Alcohol Screen: Low Risk (09/19/2021)   Alcohol Screen    Last Alcohol Screening Score (AUDIT): 0  Housing: Unknown (02/25/2024)   Received from [facility]   Epic    Unable to Pay  for Housing in the Last Year: Not on file    Number of Times Moved in the Last Year: Not on file    At any time in the past 12 months, were you homeless or living in a shelter (including now)?: No  Utilities: Not At Risk (10/31/2022)   [facility] Utilities    Threatened with loss of utilities: No  Health Literacy: Not on file      Review of Systems  Constitutional:  Negative for fatigue and fever.  HENT:  Negative for congestion, mouth sores and postnasal drip.   Respiratory:  Negative for cough.   Cardiovascular:  Negative for chest pain.  Endocrine:       Fluctuating blood glucose levels  Genitourinary:  Negative for flank pain.  Psychiatric/Behavioral: Negative.      Vital Signs: BP 122/60   Pulse 87   Temp 98 F (36.7 C)   Resp 16   Ht 5' 6 (1.676 m)   Wt 141 lb (64 kg)   SpO2 95%   BMI 22.76 kg/m    Physical Exam Constitutional:      Appearance: Normal appearance.  HENT:     Head: Normocephalic and atraumatic.     Nose: Nose normal.     Mouth/Throat:     Mouth: Mucous membranes are moist.     Pharynx: No posterior oropharyngeal erythema.  Eyes:     Extraocular Movements: Extraocular movements intact.     Pupils: Pupils are equal, round, and reactive to light.  Cardiovascular:     Pulses: Normal pulses.     Heart sounds: Normal heart sounds.  Pulmonary:     Effort: Pulmonary effort is normal.     Breath sounds: Normal  breath sounds.  Neurological:     General: No focal deficit present.     Mental Status: He is alert.  Psychiatric:        Mood and Affect: Mood normal.        Behavior: Behavior normal.        Assessment/Plan: 1. Type 2 diabetes mellitus with stage 3a chronic kidney disease, with long-term current use of insulin  (HCC) (Primary) Patient instructed to bring all his medications in 2 weeks take basal insulin  at lunch, continue glimepiride  and Jardiance  as before as before - POCT Glucose (CBG)  2. Weight loss, non-intentional Will monitor patient has lost another 5 pounds since last visit might need a nutrition consult  3. Hyperlipidemia associated with type 2 diabetes mellitus (HCC) This is at target, controlled  4. Aortic atherosclerosis Will continue atorvastatin  20 mg once a day  General Counseling: Raedyn verbalizes understanding of the findings of todays visit and agrees with plan of treatment. I have discussed any further diagnostic evaluation that may be needed or ordered today. We also reviewed his medications today. he has been encouraged to call the office with any questions or concerns that should arise related to todays visit.    Orders Placed This Encounter  Procedures   POCT Glucose (CBG)      Total time spent:35 Minutes Time spent includes review of chart, medications, test results, and follow up plan with the patient.   [place] Controlled Substance Database was reviewed by me.   Dr Manjot Beumer M Elic Vencill Internal medicine

## 2024-10-18 ENCOUNTER — Ambulatory Visit: Admitting: Internal Medicine

## 2024-10-31 ENCOUNTER — Ambulatory Visit: Admitting: Internal Medicine

## 2025-03-30 ENCOUNTER — Ambulatory Visit: Admitting: Nurse Practitioner

## 2025-08-09 ENCOUNTER — Encounter: Admitting: Internal Medicine
